# Patient Record
Sex: Female | Born: 1965
Health system: Southern US, Community
[De-identification: ages and names within clinical notes are randomized; demographics above are authoritative.]

## PROBLEM LIST (undated history)

## (undated) DIAGNOSIS — K219 Gastro-esophageal reflux disease without esophagitis: Secondary | ICD-10-CM

## (undated) DIAGNOSIS — J111 Influenza due to unidentified influenza virus with other respiratory manifestations: Secondary | ICD-10-CM

## (undated) DIAGNOSIS — M1990 Unspecified osteoarthritis, unspecified site: Secondary | ICD-10-CM

## (undated) DIAGNOSIS — R51 Headache: Secondary | ICD-10-CM

## (undated) DIAGNOSIS — R112 Nausea with vomiting, unspecified: Secondary | ICD-10-CM

## (undated) DIAGNOSIS — I4891 Unspecified atrial fibrillation: Secondary | ICD-10-CM

## (undated) DIAGNOSIS — T7840XA Allergy, unspecified, initial encounter: Secondary | ICD-10-CM

## (undated) DIAGNOSIS — Z9889 Other specified postprocedural states: Secondary | ICD-10-CM

## (undated) DIAGNOSIS — N189 Chronic kidney disease, unspecified: Secondary | ICD-10-CM

## (undated) DIAGNOSIS — I10 Essential (primary) hypertension: Secondary | ICD-10-CM

## (undated) HISTORY — DX: Chronic kidney disease, unspecified: N18.9

## (undated) HISTORY — PX: BREAST LUMPECTOMY: SHX2

## (undated) HISTORY — PX: BREAST EXCISIONAL BIOPSY: SUR124

## (undated) HISTORY — DX: Nausea with vomiting, unspecified: R11.2

## (undated) HISTORY — DX: Other specified postprocedural states: Z98.890

## (undated) HISTORY — DX: Gastro-esophageal reflux disease without esophagitis: K21.9

## (undated) HISTORY — DX: Essential (primary) hypertension: I10

## (undated) HISTORY — DX: Influenza due to unidentified influenza virus with other respiratory manifestations: J11.1

## (undated) HISTORY — PX: JOINT REPLACEMENT: SHX530

## (undated) HISTORY — PX: OTHER SURGICAL HISTORY: SHX169

## (undated) HISTORY — DX: Allergy, unspecified, initial encounter: T78.40XA

## (undated) HISTORY — DX: Unspecified osteoarthritis, unspecified site: M19.90

## (undated) HISTORY — DX: Headache: R51

## (undated) SURGERY — Surgical Case
Anesthesia: *Unknown

---

## 1986-05-02 HISTORY — PX: TUBAL LIGATION: SHX77

## 1997-09-01 ENCOUNTER — Emergency Department (HOSPITAL_COMMUNITY): Admission: EM | Admit: 1997-09-01 | Discharge: 1997-09-01 | Payer: Self-pay | Admitting: *Deleted

## 1997-09-02 ENCOUNTER — Encounter: Admission: RE | Admit: 1997-09-02 | Discharge: 1997-09-02 | Payer: Self-pay | Admitting: Family Medicine

## 1997-10-07 ENCOUNTER — Encounter: Admission: RE | Admit: 1997-10-07 | Discharge: 1997-10-07 | Payer: Self-pay | Admitting: Family Medicine

## 1997-11-04 ENCOUNTER — Encounter: Admission: RE | Admit: 1997-11-04 | Discharge: 1997-11-04 | Payer: Self-pay | Admitting: Family Medicine

## 1998-01-22 ENCOUNTER — Encounter: Admission: RE | Admit: 1998-01-22 | Discharge: 1998-01-22 | Payer: Self-pay | Admitting: Family Medicine

## 1999-04-05 ENCOUNTER — Encounter: Payer: Self-pay | Admitting: Emergency Medicine

## 1999-04-05 ENCOUNTER — Emergency Department (HOSPITAL_COMMUNITY): Admission: EM | Admit: 1999-04-05 | Discharge: 1999-04-05 | Payer: Self-pay | Admitting: Emergency Medicine

## 2000-01-16 ENCOUNTER — Emergency Department (HOSPITAL_COMMUNITY): Admission: EM | Admit: 2000-01-16 | Discharge: 2000-01-17 | Payer: Self-pay | Admitting: Emergency Medicine

## 2001-11-14 ENCOUNTER — Emergency Department (HOSPITAL_COMMUNITY): Admission: EM | Admit: 2001-11-14 | Discharge: 2001-11-15 | Payer: Self-pay | Admitting: Emergency Medicine

## 2001-12-09 ENCOUNTER — Emergency Department (HOSPITAL_COMMUNITY): Admission: EM | Admit: 2001-12-09 | Discharge: 2001-12-09 | Payer: Self-pay

## 2001-12-20 ENCOUNTER — Encounter: Admission: RE | Admit: 2001-12-20 | Discharge: 2001-12-20 | Payer: Self-pay | Admitting: Family Medicine

## 2002-01-08 ENCOUNTER — Encounter: Admission: RE | Admit: 2002-01-08 | Discharge: 2002-01-08 | Payer: Self-pay | Admitting: Family Medicine

## 2002-01-15 ENCOUNTER — Ambulatory Visit (HOSPITAL_COMMUNITY): Admission: RE | Admit: 2002-01-15 | Discharge: 2002-01-15 | Payer: Self-pay | Admitting: Family Medicine

## 2002-01-15 ENCOUNTER — Encounter: Admission: RE | Admit: 2002-01-15 | Discharge: 2002-01-15 | Payer: Self-pay | Admitting: Family Medicine

## 2002-03-12 ENCOUNTER — Encounter: Payer: Self-pay | Admitting: Sports Medicine

## 2002-03-12 ENCOUNTER — Encounter: Admission: RE | Admit: 2002-03-12 | Discharge: 2002-03-12 | Payer: Self-pay | Admitting: Sports Medicine

## 2002-03-12 ENCOUNTER — Encounter: Admission: RE | Admit: 2002-03-12 | Discharge: 2002-03-12 | Payer: Self-pay | Admitting: Family Medicine

## 2002-04-12 ENCOUNTER — Encounter: Admission: RE | Admit: 2002-04-12 | Discharge: 2002-04-12 | Payer: Self-pay | Admitting: Family Medicine

## 2002-04-18 ENCOUNTER — Encounter: Admission: RE | Admit: 2002-04-18 | Discharge: 2002-04-18 | Payer: Self-pay | Admitting: Family Medicine

## 2002-06-19 ENCOUNTER — Encounter: Admission: RE | Admit: 2002-06-19 | Discharge: 2002-06-19 | Payer: Self-pay | Admitting: Family Medicine

## 2002-10-02 ENCOUNTER — Emergency Department (HOSPITAL_COMMUNITY): Admission: EM | Admit: 2002-10-02 | Discharge: 2002-10-02 | Payer: Self-pay | Admitting: Emergency Medicine

## 2002-10-08 ENCOUNTER — Encounter: Admission: RE | Admit: 2002-10-08 | Discharge: 2002-10-08 | Payer: Self-pay | Admitting: Family Medicine

## 2002-10-29 ENCOUNTER — Encounter: Admission: RE | Admit: 2002-10-29 | Discharge: 2002-10-29 | Payer: Self-pay | Admitting: Family Medicine

## 2002-12-31 ENCOUNTER — Encounter: Admission: RE | Admit: 2002-12-31 | Discharge: 2002-12-31 | Payer: Self-pay | Admitting: Family Medicine

## 2003-01-15 ENCOUNTER — Encounter: Payer: Self-pay | Admitting: Emergency Medicine

## 2003-01-15 ENCOUNTER — Emergency Department (HOSPITAL_COMMUNITY): Admission: EM | Admit: 2003-01-15 | Discharge: 2003-01-15 | Payer: Self-pay | Admitting: Emergency Medicine

## 2003-01-16 ENCOUNTER — Encounter: Admission: RE | Admit: 2003-01-16 | Discharge: 2003-01-16 | Payer: Self-pay | Admitting: Family Medicine

## 2003-01-16 ENCOUNTER — Ambulatory Visit (HOSPITAL_COMMUNITY): Admission: RE | Admit: 2003-01-16 | Discharge: 2003-01-16 | Payer: Self-pay | Admitting: Family Medicine

## 2003-01-20 ENCOUNTER — Encounter: Admission: RE | Admit: 2003-01-20 | Discharge: 2003-01-20 | Payer: Self-pay | Admitting: Family Medicine

## 2003-01-23 ENCOUNTER — Encounter: Admission: RE | Admit: 2003-01-23 | Discharge: 2003-01-23 | Payer: Self-pay | Admitting: Family Medicine

## 2003-03-31 ENCOUNTER — Encounter: Admission: RE | Admit: 2003-03-31 | Discharge: 2003-03-31 | Payer: Self-pay | Admitting: Occupational Medicine

## 2003-07-04 ENCOUNTER — Ambulatory Visit (HOSPITAL_COMMUNITY): Admission: RE | Admit: 2003-07-04 | Discharge: 2003-07-04 | Payer: Self-pay | Admitting: Family Medicine

## 2003-07-04 ENCOUNTER — Encounter: Admission: RE | Admit: 2003-07-04 | Discharge: 2003-07-04 | Payer: Self-pay | Admitting: Family Medicine

## 2003-12-02 ENCOUNTER — Encounter: Admission: RE | Admit: 2003-12-02 | Discharge: 2003-12-02 | Payer: Self-pay | Admitting: Family Medicine

## 2004-01-14 ENCOUNTER — Emergency Department (HOSPITAL_COMMUNITY): Admission: EM | Admit: 2004-01-14 | Discharge: 2004-01-14 | Payer: Self-pay | Admitting: Emergency Medicine

## 2004-08-04 ENCOUNTER — Ambulatory Visit: Payer: Self-pay | Admitting: Family Medicine

## 2004-08-19 ENCOUNTER — Ambulatory Visit: Payer: Self-pay | Admitting: Family Medicine

## 2004-11-19 ENCOUNTER — Emergency Department (HOSPITAL_COMMUNITY): Admission: EM | Admit: 2004-11-19 | Discharge: 2004-11-19 | Payer: Self-pay | Admitting: Emergency Medicine

## 2004-11-29 ENCOUNTER — Ambulatory Visit: Payer: Self-pay | Admitting: Family Medicine

## 2004-12-23 ENCOUNTER — Ambulatory Visit: Payer: Self-pay | Admitting: Family Medicine

## 2004-12-23 ENCOUNTER — Encounter: Payer: Self-pay | Admitting: Family Medicine

## 2005-01-05 ENCOUNTER — Encounter (INDEPENDENT_AMBULATORY_CARE_PROVIDER_SITE_OTHER): Payer: Self-pay | Admitting: *Deleted

## 2005-06-09 ENCOUNTER — Ambulatory Visit: Payer: Self-pay | Admitting: Family Medicine

## 2005-06-20 ENCOUNTER — Ambulatory Visit (HOSPITAL_COMMUNITY): Admission: RE | Admit: 2005-06-20 | Discharge: 2005-06-20 | Payer: Self-pay | Admitting: Orthopedic Surgery

## 2005-07-19 ENCOUNTER — Ambulatory Visit (HOSPITAL_BASED_OUTPATIENT_CLINIC_OR_DEPARTMENT_OTHER): Admission: RE | Admit: 2005-07-19 | Discharge: 2005-07-19 | Payer: Self-pay | Admitting: Orthopedic Surgery

## 2005-07-19 ENCOUNTER — Encounter (INDEPENDENT_AMBULATORY_CARE_PROVIDER_SITE_OTHER): Payer: Self-pay | Admitting: *Deleted

## 2005-08-04 ENCOUNTER — Emergency Department (HOSPITAL_COMMUNITY): Admission: EM | Admit: 2005-08-04 | Discharge: 2005-08-04 | Payer: Self-pay | Admitting: Emergency Medicine

## 2005-12-22 ENCOUNTER — Encounter (INDEPENDENT_AMBULATORY_CARE_PROVIDER_SITE_OTHER): Payer: Self-pay | Admitting: *Deleted

## 2005-12-22 ENCOUNTER — Ambulatory Visit: Payer: Self-pay | Admitting: Family Medicine

## 2006-02-21 ENCOUNTER — Ambulatory Visit: Payer: Self-pay | Admitting: Family Medicine

## 2006-02-23 ENCOUNTER — Encounter: Admission: RE | Admit: 2006-02-23 | Discharge: 2006-02-23 | Payer: Self-pay | Admitting: Family Medicine

## 2006-06-29 DIAGNOSIS — I10 Essential (primary) hypertension: Secondary | ICD-10-CM | POA: Insufficient documentation

## 2006-06-29 DIAGNOSIS — K219 Gastro-esophageal reflux disease without esophagitis: Secondary | ICD-10-CM | POA: Insufficient documentation

## 2006-06-29 DIAGNOSIS — K589 Irritable bowel syndrome without diarrhea: Secondary | ICD-10-CM | POA: Insufficient documentation

## 2006-06-30 ENCOUNTER — Encounter (INDEPENDENT_AMBULATORY_CARE_PROVIDER_SITE_OTHER): Payer: Self-pay | Admitting: *Deleted

## 2007-01-04 ENCOUNTER — Encounter (INDEPENDENT_AMBULATORY_CARE_PROVIDER_SITE_OTHER): Payer: Self-pay | Admitting: *Deleted

## 2007-02-22 ENCOUNTER — Encounter (INDEPENDENT_AMBULATORY_CARE_PROVIDER_SITE_OTHER): Payer: Self-pay | Admitting: *Deleted

## 2007-03-19 ENCOUNTER — Encounter: Payer: Self-pay | Admitting: Family Medicine

## 2007-03-19 ENCOUNTER — Ambulatory Visit: Payer: Self-pay | Admitting: Family Medicine

## 2007-03-19 DIAGNOSIS — N281 Cyst of kidney, acquired: Secondary | ICD-10-CM | POA: Insufficient documentation

## 2007-03-19 LAB — CONVERTED CEMR LAB
CO2: 26 meq/L (ref 19–32)
Calcium: 8.8 mg/dL (ref 8.4–10.5)
Creatinine, Ser: 0.9 mg/dL (ref 0.40–1.20)
Glucose, Bld: 90 mg/dL (ref 70–99)
HCT: 36.5 % (ref 36.0–46.0)
MCHC: 34 g/dL (ref 30.0–36.0)
MCV: 77.3 fL — ABNORMAL LOW (ref 78.0–100.0)
Platelets: 313 10*3/uL (ref 150–400)
RBC: 4.72 M/uL (ref 3.87–5.11)
Sodium: 138 meq/L (ref 135–145)
WBC: 8 10*3/uL (ref 4.0–10.5)

## 2007-03-21 ENCOUNTER — Encounter: Admission: RE | Admit: 2007-03-21 | Discharge: 2007-03-21 | Payer: Self-pay | Admitting: Family Medicine

## 2007-03-23 ENCOUNTER — Telehealth (INDEPENDENT_AMBULATORY_CARE_PROVIDER_SITE_OTHER): Payer: Self-pay | Admitting: *Deleted

## 2007-03-23 ENCOUNTER — Telehealth: Payer: Self-pay | Admitting: Family Medicine

## 2007-04-27 ENCOUNTER — Telehealth: Payer: Self-pay | Admitting: Family Medicine

## 2007-04-30 ENCOUNTER — Ambulatory Visit: Payer: Self-pay | Admitting: Family Medicine

## 2007-04-30 ENCOUNTER — Encounter: Payer: Self-pay | Admitting: Family Medicine

## 2007-05-01 ENCOUNTER — Encounter: Payer: Self-pay | Admitting: Family Medicine

## 2007-05-01 ENCOUNTER — Telehealth: Payer: Self-pay | Admitting: *Deleted

## 2007-05-07 LAB — CONVERTED CEMR LAB
Pap Smear: NORMAL
Pap Smear: NORMAL

## 2007-05-08 ENCOUNTER — Encounter: Payer: Self-pay | Admitting: Family Medicine

## 2007-05-15 ENCOUNTER — Telehealth: Payer: Self-pay | Admitting: *Deleted

## 2007-06-11 ENCOUNTER — Ambulatory Visit: Payer: Self-pay | Admitting: Family Medicine

## 2007-07-09 ENCOUNTER — Telehealth: Payer: Self-pay | Admitting: Family Medicine

## 2007-07-26 ENCOUNTER — Telehealth: Payer: Self-pay | Admitting: *Deleted

## 2007-08-06 ENCOUNTER — Telehealth: Payer: Self-pay | Admitting: Family Medicine

## 2007-08-09 ENCOUNTER — Ambulatory Visit: Payer: Self-pay | Admitting: Family Medicine

## 2007-08-09 LAB — CONVERTED CEMR LAB: LDL Cholesterol: 52 mg/dL (ref 0–99)

## 2007-08-10 ENCOUNTER — Encounter: Payer: Self-pay | Admitting: Family Medicine

## 2007-09-18 ENCOUNTER — Telehealth: Payer: Self-pay | Admitting: *Deleted

## 2007-09-20 ENCOUNTER — Emergency Department (HOSPITAL_COMMUNITY): Admission: EM | Admit: 2007-09-20 | Discharge: 2007-09-21 | Payer: Self-pay | Admitting: Emergency Medicine

## 2007-09-20 ENCOUNTER — Inpatient Hospital Stay (HOSPITAL_COMMUNITY): Admission: AD | Admit: 2007-09-20 | Discharge: 2007-09-20 | Payer: Self-pay | Admitting: Obstetrics & Gynecology

## 2007-10-01 ENCOUNTER — Ambulatory Visit: Payer: Self-pay | Admitting: Family Medicine

## 2007-12-05 ENCOUNTER — Encounter: Payer: Self-pay | Admitting: Family Medicine

## 2007-12-13 ENCOUNTER — Encounter (INDEPENDENT_AMBULATORY_CARE_PROVIDER_SITE_OTHER): Payer: Self-pay | Admitting: *Deleted

## 2008-03-20 ENCOUNTER — Ambulatory Visit: Payer: Self-pay | Admitting: Family Medicine

## 2008-03-24 ENCOUNTER — Encounter: Payer: Self-pay | Admitting: Family Medicine

## 2008-03-24 ENCOUNTER — Encounter: Admission: RE | Admit: 2008-03-24 | Discharge: 2008-03-24 | Payer: Self-pay | Admitting: Family Medicine

## 2008-04-02 ENCOUNTER — Encounter: Admission: RE | Admit: 2008-04-02 | Discharge: 2008-04-02 | Payer: Self-pay | Admitting: Family Medicine

## 2008-04-04 ENCOUNTER — Encounter: Payer: Self-pay | Admitting: Family Medicine

## 2008-04-07 ENCOUNTER — Encounter: Payer: Self-pay | Admitting: Family Medicine

## 2008-04-07 ENCOUNTER — Ambulatory Visit: Payer: Self-pay | Admitting: Family Medicine

## 2008-04-07 LAB — CONVERTED CEMR LAB
CO2: 23 meq/L (ref 19–32)
Chloride: 103 meq/L (ref 96–112)
Glucose, Bld: 139 mg/dL — ABNORMAL HIGH (ref 70–99)
Sodium: 139 meq/L (ref 135–145)

## 2008-04-08 ENCOUNTER — Encounter: Payer: Self-pay | Admitting: Family Medicine

## 2008-04-18 ENCOUNTER — Ambulatory Visit (HOSPITAL_BASED_OUTPATIENT_CLINIC_OR_DEPARTMENT_OTHER): Admission: RE | Admit: 2008-04-18 | Discharge: 2008-04-18 | Payer: Self-pay | Admitting: Orthopedic Surgery

## 2008-04-28 ENCOUNTER — Encounter: Payer: Self-pay | Admitting: Family Medicine

## 2008-05-02 HISTORY — PX: BREAST SURGERY: SHX581

## 2008-06-16 ENCOUNTER — Encounter: Payer: Self-pay | Admitting: Family Medicine

## 2008-06-23 ENCOUNTER — Telehealth: Payer: Self-pay | Admitting: Family Medicine

## 2008-07-14 ENCOUNTER — Ambulatory Visit: Payer: Self-pay | Admitting: Family Medicine

## 2008-07-28 ENCOUNTER — Encounter: Admission: RE | Admit: 2008-07-28 | Discharge: 2008-07-28 | Payer: Self-pay | Admitting: Family Medicine

## 2008-08-01 ENCOUNTER — Encounter: Admission: RE | Admit: 2008-08-01 | Discharge: 2008-08-01 | Payer: Self-pay | Admitting: Family Medicine

## 2008-08-01 ENCOUNTER — Encounter: Payer: Self-pay | Admitting: Family Medicine

## 2008-08-14 ENCOUNTER — Encounter: Admission: RE | Admit: 2008-08-14 | Discharge: 2008-08-14 | Payer: Self-pay | Admitting: Family Medicine

## 2008-08-14 ENCOUNTER — Encounter: Payer: Self-pay | Admitting: Family Medicine

## 2008-08-18 ENCOUNTER — Telehealth: Payer: Self-pay | Admitting: Family Medicine

## 2008-09-10 ENCOUNTER — Ambulatory Visit (HOSPITAL_COMMUNITY): Admission: RE | Admit: 2008-09-10 | Discharge: 2008-09-11 | Payer: Self-pay | Admitting: General Surgery

## 2008-09-10 ENCOUNTER — Encounter (INDEPENDENT_AMBULATORY_CARE_PROVIDER_SITE_OTHER): Payer: Self-pay | Admitting: General Surgery

## 2008-09-10 ENCOUNTER — Encounter: Admission: RE | Admit: 2008-09-10 | Discharge: 2008-09-10 | Payer: Self-pay | Admitting: General Surgery

## 2008-09-12 ENCOUNTER — Telehealth: Payer: Self-pay | Admitting: Family Medicine

## 2008-09-19 ENCOUNTER — Ambulatory Visit: Payer: Self-pay | Admitting: Oncology

## 2008-10-14 LAB — CBC WITH DIFFERENTIAL (CANCER CENTER ONLY)
BASO#: 0 10*3/uL (ref 0.0–0.2)
Eosinophils Absolute: 0.2 10*3/uL (ref 0.0–0.5)
HGB: 12.5 g/dL (ref 11.6–15.9)
LYMPH%: 41.3 % (ref 14.0–48.0)
MCH: 26.8 pg (ref 26.0–34.0)
MCV: 77 fL — ABNORMAL LOW (ref 81–101)
MONO#: 0.4 10*3/uL (ref 0.1–0.9)
MONO%: 7.8 % (ref 0.0–13.0)
NEUT#: 2.3 10*3/uL (ref 1.5–6.5)
RBC: 4.66 10*6/uL (ref 3.70–5.32)
WBC: 4.9 10*3/uL (ref 3.9–10.0)

## 2008-10-14 LAB — CMP (CANCER CENTER ONLY)
ALT(SGPT): 27 U/L (ref 10–47)
AST: 26 U/L (ref 11–38)
Albumin: 3.2 g/dL — ABNORMAL LOW (ref 3.3–5.5)
BUN, Bld: 10 mg/dL (ref 7–22)
Calcium: 10 mg/dL (ref 8.0–10.3)
Chloride: 106 mEq/L (ref 98–108)
Potassium: 3.2 mEq/L — ABNORMAL LOW (ref 3.3–4.7)

## 2008-10-20 ENCOUNTER — Ambulatory Visit: Payer: Self-pay | Admitting: Family Medicine

## 2008-10-21 ENCOUNTER — Encounter: Admission: RE | Admit: 2008-10-21 | Discharge: 2008-10-21 | Payer: Self-pay | Admitting: Family Medicine

## 2008-10-22 ENCOUNTER — Encounter: Payer: Self-pay | Admitting: Family Medicine

## 2008-11-10 ENCOUNTER — Encounter: Payer: Self-pay | Admitting: Family Medicine

## 2008-11-10 ENCOUNTER — Ambulatory Visit: Payer: Self-pay | Admitting: Family Medicine

## 2008-12-01 ENCOUNTER — Encounter: Payer: Self-pay | Admitting: Family Medicine

## 2008-12-04 ENCOUNTER — Telehealth: Payer: Self-pay | Admitting: Family Medicine

## 2008-12-12 ENCOUNTER — Ambulatory Visit: Payer: Self-pay | Admitting: Oncology

## 2008-12-17 LAB — CMP (CANCER CENTER ONLY)
Albumin: 3.3 g/dL (ref 3.3–5.5)
Alkaline Phosphatase: 57 U/L (ref 26–84)
BUN, Bld: 8 mg/dL (ref 7–22)
Glucose, Bld: 152 mg/dL — ABNORMAL HIGH (ref 73–118)
Potassium: 3.3 mEq/L (ref 3.3–4.7)
Total Bilirubin: 0.6 mg/dl (ref 0.20–1.60)

## 2008-12-17 LAB — CBC WITH DIFFERENTIAL (CANCER CENTER ONLY)
BASO#: 0 10*3/uL (ref 0.0–0.2)
Eosinophils Absolute: 0.1 10*3/uL (ref 0.0–0.5)
HGB: 12.7 g/dL (ref 11.6–15.9)
LYMPH#: 2.7 10*3/uL (ref 0.9–3.3)
NEUT#: 2.9 10*3/uL (ref 1.5–6.5)
RBC: 4.78 10*6/uL (ref 3.70–5.32)
WBC: 6.2 10*3/uL (ref 3.9–10.0)

## 2009-01-15 ENCOUNTER — Encounter: Admission: RE | Admit: 2009-01-15 | Discharge: 2009-01-15 | Payer: Self-pay | Admitting: General Surgery

## 2009-03-17 ENCOUNTER — Ambulatory Visit: Payer: Self-pay | Admitting: Oncology

## 2009-03-18 LAB — CBC WITH DIFFERENTIAL (CANCER CENTER ONLY)
BASO%: 0.5 % (ref 0.0–2.0)
EOS%: 2.1 % (ref 0.0–7.0)
HGB: 12.1 g/dL (ref 11.6–15.9)
LYMPH#: 2.6 10*3/uL (ref 0.9–3.3)
MCHC: 33.3 g/dL (ref 32.0–36.0)
NEUT#: 2.4 10*3/uL (ref 1.5–6.5)
Platelets: 319 10*3/uL (ref 145–400)
RDW: 13.5 % (ref 10.5–14.6)

## 2009-03-18 LAB — CMP (CANCER CENTER ONLY)
ALT(SGPT): 22 U/L (ref 10–47)
AST: 28 U/L (ref 11–38)
Albumin: 3.3 g/dL (ref 3.3–5.5)
Alkaline Phosphatase: 65 U/L (ref 26–84)
Potassium: 3 mEq/L — ABNORMAL LOW (ref 3.3–4.7)
Sodium: 136 mEq/L (ref 128–145)
Total Protein: 7.8 g/dL (ref 6.4–8.1)

## 2009-03-20 ENCOUNTER — Encounter: Admission: RE | Admit: 2009-03-20 | Discharge: 2009-03-20 | Payer: Self-pay | Admitting: General Surgery

## 2009-05-02 HISTORY — PX: FRACTURE SURGERY: SHX138

## 2009-05-11 ENCOUNTER — Ambulatory Visit: Payer: Self-pay | Admitting: Family Medicine

## 2009-05-11 DIAGNOSIS — R7309 Other abnormal glucose: Secondary | ICD-10-CM | POA: Insufficient documentation

## 2009-05-12 ENCOUNTER — Encounter: Payer: Self-pay | Admitting: Family Medicine

## 2009-05-12 LAB — CONVERTED CEMR LAB
BUN: 6 mg/dL (ref 6–23)
CO2: 28 meq/L (ref 19–32)
Chloride: 97 meq/L (ref 96–112)
Creatinine, Ser: 0.88 mg/dL (ref 0.40–1.20)
Glucose, Bld: 108 mg/dL — ABNORMAL HIGH (ref 70–99)
Potassium: 3.2 meq/L — ABNORMAL LOW (ref 3.5–5.3)

## 2009-06-26 ENCOUNTER — Ambulatory Visit: Payer: Self-pay | Admitting: Oncology

## 2009-07-10 ENCOUNTER — Ambulatory Visit: Payer: Self-pay | Admitting: Family Medicine

## 2009-07-10 ENCOUNTER — Encounter: Payer: Self-pay | Admitting: Family Medicine

## 2009-07-13 LAB — CONVERTED CEMR LAB
BUN: 10 mg/dL (ref 6–23)
Calcium: 9.6 mg/dL (ref 8.4–10.5)
Creatinine, Ser: 0.81 mg/dL (ref 0.40–1.20)
Glucose, Bld: 110 mg/dL — ABNORMAL HIGH (ref 70–99)
Potassium: 3.3 meq/L — ABNORMAL LOW (ref 3.5–5.3)

## 2009-09-18 ENCOUNTER — Encounter: Admission: RE | Admit: 2009-09-18 | Discharge: 2009-09-18 | Payer: Self-pay | Admitting: General Surgery

## 2009-11-11 ENCOUNTER — Ambulatory Visit: Payer: Self-pay | Admitting: Family Medicine

## 2009-11-11 DIAGNOSIS — S82899A Other fracture of unspecified lower leg, initial encounter for closed fracture: Secondary | ICD-10-CM | POA: Insufficient documentation

## 2009-11-11 LAB — CONVERTED CEMR LAB
ALT: 14 units/L (ref 0–35)
Albumin: 4 g/dL (ref 3.5–5.2)
CO2: 24 meq/L (ref 19–32)
MCV: 77 fL — ABNORMAL LOW (ref 78.0–100.0)
Platelets: 339 10*3/uL (ref 150–400)
Potassium: 3.4 meq/L — ABNORMAL LOW (ref 3.5–5.3)
Sodium: 135 meq/L (ref 135–145)
Total Bilirubin: 0.3 mg/dL (ref 0.3–1.2)
Total Protein: 8.1 g/dL (ref 6.0–8.3)
WBC: 6.7 10*3/uL (ref 4.0–10.5)

## 2009-11-12 ENCOUNTER — Encounter: Payer: Self-pay | Admitting: Family Medicine

## 2009-11-17 ENCOUNTER — Encounter: Payer: Self-pay | Admitting: Family Medicine

## 2009-11-17 ENCOUNTER — Ambulatory Visit (HOSPITAL_COMMUNITY): Admission: RE | Admit: 2009-11-17 | Discharge: 2009-11-17 | Payer: Self-pay | Admitting: Family Medicine

## 2009-12-03 ENCOUNTER — Encounter: Payer: Self-pay | Admitting: Family Medicine

## 2009-12-15 ENCOUNTER — Ambulatory Visit (HOSPITAL_COMMUNITY): Admission: RE | Admit: 2009-12-15 | Discharge: 2009-12-15 | Payer: Self-pay | Admitting: Urology

## 2010-01-01 ENCOUNTER — Ambulatory Visit (HOSPITAL_COMMUNITY): Admission: RE | Admit: 2010-01-01 | Discharge: 2010-01-01 | Payer: Self-pay | Admitting: Urology

## 2010-01-20 ENCOUNTER — Encounter: Payer: Self-pay | Admitting: Family Medicine

## 2010-03-04 ENCOUNTER — Ambulatory Visit: Payer: Self-pay | Admitting: Family Medicine

## 2010-03-04 DIAGNOSIS — N926 Irregular menstruation, unspecified: Secondary | ICD-10-CM | POA: Insufficient documentation

## 2010-03-16 ENCOUNTER — Telehealth: Payer: Self-pay | Admitting: *Deleted

## 2010-03-29 ENCOUNTER — Encounter: Admission: RE | Admit: 2010-03-29 | Discharge: 2010-03-29 | Payer: Self-pay | Admitting: Family Medicine

## 2010-03-31 ENCOUNTER — Telehealth: Payer: Self-pay | Admitting: Family Medicine

## 2010-05-09 ENCOUNTER — Encounter: Payer: Self-pay | Admitting: Family Medicine

## 2010-05-17 ENCOUNTER — Ambulatory Visit: Admission: RE | Admit: 2010-05-17 | Discharge: 2010-05-17 | Payer: Self-pay | Source: Home / Self Care

## 2010-05-17 ENCOUNTER — Encounter: Payer: Self-pay | Admitting: Family Medicine

## 2010-05-17 LAB — CONVERTED CEMR LAB
HCT: 35.8 % — ABNORMAL LOW (ref 36.0–46.0)
MCV: 75.4 fL — ABNORMAL LOW (ref 78.0–100.0)
Platelets: 380 10*3/uL (ref 150–400)
RDW: 15.2 % (ref 11.5–15.5)
TSH: 1.686 microintl units/mL (ref 0.350–4.500)

## 2010-05-20 ENCOUNTER — Ambulatory Visit (HOSPITAL_COMMUNITY)
Admission: RE | Admit: 2010-05-20 | Discharge: 2010-05-20 | Payer: Self-pay | Source: Home / Self Care | Attending: Urology | Admitting: Urology

## 2010-05-20 ENCOUNTER — Ambulatory Visit (HOSPITAL_COMMUNITY)
Admission: RE | Admit: 2010-05-20 | Discharge: 2010-05-20 | Payer: Self-pay | Source: Home / Self Care | Attending: Family Medicine | Admitting: Family Medicine

## 2010-05-24 ENCOUNTER — Encounter: Payer: Self-pay | Admitting: Family Medicine

## 2010-05-24 LAB — CREATININE, SERUM
Creatinine, Ser: 0.86 mg/dL (ref 0.4–1.2)
GFR calc Af Amer: >60 mL/min (ref >60)

## 2010-05-31 ENCOUNTER — Telehealth (INDEPENDENT_AMBULATORY_CARE_PROVIDER_SITE_OTHER): Payer: Self-pay | Admitting: *Deleted

## 2010-06-01 NOTE — Assessment & Plan Note (Signed)
Summary: f/up,tcb   Vital Signs:  Patient profile:   45 year old female Temp:     98.6 degrees F Pulse rate:   68 / minute BP sitting:   150 / 86  Vitals Entered By: Jone Baseman CMA (May 11, 2009 3:44 PM) CC: f/u BP Is Patient Diabetic? No Pain Assessment Patient in pain? yes     Location: left foot Intensity: 7   CC:  f/u BP.  History of Present Illness: Hyperglycemia had high reading at a health fair and once at her oncologist.  Strong family history of diabetes.  No polyuria but does have dry mouth and drinks frequently  HYPERTENSION Disease Monitoring   Blood pressure range: has cuff but does not check usually.  Has been controlled at her oncologists      Chest pain: N     Dyspnea:N Medications   Compliance: daily   Lightheadedness: N     Edema:N Prevention   Exercise: occsl    Salt restriction:Y  ROS - as above PMH - Medications reviewed and updated in medication list.  Smoking Status noted in VS form       Current Medications (verified): 1)  Enalapril Maleate 10 Mg  Tabs (Enalapril Maleate) .... Take 1 Tab By Mouth Two Times A Day For Your Blood Pressure 2)  Hydrochlorothiazide 25 Mg Tabs (Hydrochlorothiazide) .... Take 1 Tablet By Mouth Every Morning  Allergies: 1)  Septra (Sulfamethoxazole-Trimethoprim) 2)  Tetracycline Hcl (Tetracycline Hcl)  Physical Exam  General:  no acute distress healthy appearing    Impression & Recommendations:  Problem # 1:  HYPERGLYCEMIA (ICD-790.29) check labs.  Discussed importance of diet and wt loss .  Exercise will be difficult with her ankle fracture 2 days ago with surgery planned.  Also had surgery for an oral cyst and had an abnormal mammogram that will be rechecked in mid 2011 Orders: A1C-[facility] ([address]109) [facility]- Est  Level 4 (60454)  Problem # 2:  HYPERTENSION, BENIGN SYSTEMIC (ICD-401.1) Not well controlled but may be due to recent injury.  Asked her to follow it at home and to take enalapril two times a  day instead of both tablets at once  Her updated medication list for this problem includes:    Enalapril Maleate 10 Mg Tabs (Enalapril maleate) .Marland Kitchen... Take 1 tab by mouth two times a day for your blood pressure    Hydrochlorothiazide 25 Mg Tabs (Hydrochlorothiazide) .Marland Kitchen... Take 1 tablet by mouth every morning  Orders: T-Basic Metabolic Panel [phone]) [facility]- Est  Level 4 (99214)  BP today: 150/86 Prior BP: 152/92 (11/10/2008)  Labs Reviewed: K+: 3.6 (04/07/2008) Creat: : 0.89 (04/07/2008)   Chol: 115 (08/09/2007)   HDL: 45 (08/09/2007)   LDL: 52 (08/09/2007)   TG: 89 (08/09/2007)  Problem # 3:  KIDNEY CYST, ACQUIRED (ICD-593.2) recheck imaging in July 2011  Problem # 4:  OTHER SPECIFIED DISORDERS OF BREAST - INTRADUCTAL HYPERPLASI (ICD-611.89) being followed by Dr Welton Flakes.   Not on tamoxifen  Complete Medication List: 1)  Enalapril Maleate 10 Mg Tabs (Enalapril maleate) .... Take 1 tab by mouth two times a day for your blood pressure 2)  Hydrochlorothiazide 25 Mg Tabs (Hydrochlorothiazide) .... Take 1 tablet by mouth every morning  Patient Instructions: 1)  Please schedule a follow-up appointment in 6 months .  2)  Call if your blood pressure is > 140/90 regularly 3)  You need lose weight for your health and tendency to have diabetes - goal would be 2 lbs a  week  4)  I will call you if your lab is abnormal otherwise I will send you a letter within 2 weeks. Prescriptions: ENALAPRIL MALEATE 10 MG  TABS (ENALAPRIL MALEATE) Take 1 tab by mouth two times a day for your blood pressure  #180 x 3   Entered and Authorized by:   Pearlean Brownie MD   Signed by:   Pearlean Brownie MD on 05/11/2009   Method used:   [facility] to        [facility] [phone]* (retail)       [address] [place], [place]  [address]       Ph: [phone]       Fax: [phone]   RxID:   [id number]    Prevention & Chronic Care Immunizations   Influenza vaccine: given   (03/13/2008)   Influenza vaccine due: 03/13/2009    Tetanus booster: Not documented    Pneumococcal vaccine: Not documented  Other Screening   Pap smear: NEGATIVE FOR INTRAEPITHELIAL LESIONS OR MALIGNANCY.  (11/10/2008)   Pap smear due: 05/06/2009    Mammogram: 76095.0^MM BREAST STEREO BIOPSY*L*  (08/14/2008)   Mammogram due: 06/30/2004   Smoking status: never  (11/10/2008)  Lipids   Total Cholesterol: 115  (08/09/2007)   LDL: 52  (08/09/2007)   LDL Direct: Not documented   HDL: 45  (08/09/2007)   Triglycerides: 89  (08/09/2007)  Hypertension   Last Blood Pressure: 150 / 86  (05/11/2009)   Serum creatinine: 0.89  (04/07/2008)   BMP action: Ordered   Serum potassium 3.6  (04/07/2008)    Hypertension flowsheet reviewed?: Yes   Progress toward BP goal: Unchanged  Self-Management Support :   Personal Goals (by the next clinic visit) :      Personal blood pressure goal: 140/90  (05/11/2009)   Patient will work on the following items until the next clinic visit to reach self-care goals:     Medications and monitoring: check my blood pressure  (05/11/2009)     Other: Try salt substitute   (05/11/2009)    Hypertension self-management support: BP self-monitoring log, Written self-care plan  (05/11/2009)   Hypertension self-care plan printed.  Laboratory Results   Blood Tests   Date/Time Received: May 11, 2009 4:08 PM  Date/Time Reported: May 11, 2009 4:23 PM   HGBA1C: 6.1%   (Normal Range: Non-Diabetic - 3-6%   Control Diabetic - 6-8%)  Comments: ...........test performed by...........Marland KitchenTerese Door, CMA

## 2010-06-01 NOTE — Consult Note (Signed)
[facility]   Imported By: Clydell Hakim 12/11/2009 11:04:29  _____________________________________________________________________  External Attachment:    Type:   Image     Comment:   External Document

## 2010-06-01 NOTE — Assessment & Plan Note (Signed)
Summary: f/up,tcb   Vital Signs:  Patient profile:   45 year old female Height:      68 inches Weight:      220 pounds BMI:     33.57 BSA:     2.13 Temp:     98.5 degrees F Pulse rate:   66 / minute BP sitting:   145 / 83  Vitals Entered By: Delora Fuel (November 11, 2009 11:37 AM) CC: f/u Is Patient Diabetic? No Pain Assessment Patient in pain? yes     Location: left ankle Intensity: 8   CC:  f/u.  History of Present Illness: Current Problems:  HYPERGLYCEMIA (ICD-790.29) has not lost weight.  No polyuria or dipisia  HYPERTENSION, BENIGN SYSTEMIC (ICD-401.1) home readings in 130/70 but not checking often.  Does have dry cough she attributes to enalapril.  No lightheadedness or chest pain.  Takes her medications regularly but has not taken potassium for several months.  Has been taking ibuprofen for ankle pain but decreasing now  Ankle Fx in Jan 2011 has cont'd pain and soft tissue swelling is undergoing work up   OTHER SPECIFIED DISORDERS OF BREAST - INTRADUCTAL HYPERPLASI (ICD-611.89) getting regular mammograms q 6 months  KIDNEY CYST, ACQUIRED (ICD-593.2) no bleeding. Has back pain but thinks is likely musculoskeletal from change in gait with ankle fracture  ROS - as above PMH - Medications reviewed and updated in medication list.  Smoking Status noted in VS form     Habits & Providers  Alcohol-Tobacco-Diet     Tobacco Status: never  Current Medications (verified): 1)  Hydrochlorothiazide 25 Mg Tabs (Hydrochlorothiazide) .... Take 1 Tablet By Mouth Every Morning 2)  Losartan Potassium 50 Mg Tabs (Losartan Potassium) .Marland Kitchen.. 1 By Mouth A Day For Blood Pressure  Allergies: 1)  Septra (Sulfamethoxazole-Trimethoprim) 2)  Tetracycline Hcl (Tetracycline Hcl)  Family History: Colon CA 1st degree, hypertension, Neuroblastoma-nephew Family History Diabetes 1st degree relative  Social History: works at [employer]; Lives with husband in [place] (he  is a Investment banker, operational)  Physical Exam  General:  Well-developed,well-nourished,in no acute distress; alert,appropriate and cooperative throughout examination Lungs:  Normal respiratory effort, chest expands symmetrically. Lungs are clear to auscultation, no crackles or wheezes. Heart:  Normal rate and regular rhythm. S1 and S2 normal without gallop, murmur, click, rub or other extra sounds. Extremities:  L LE - well healed scars at ankle 1 + edema with hyperpigmenttation    Impression & Recommendations:  Problem # 1:  HYPERTENSION, BENIGN SYSTEMIC (ICD-401.1) well controlled by home readings.  Change to ARB for cough  Check labs  The following medications were removed from the medication list:    Enalapril Maleate 10 Mg Tabs (Enalapril maleate) .Marland Kitchen... Take 1 tab by mouth two times a day for your blood pressure Her updated medication list for this problem includes:    Hydrochlorothiazide 25 Mg Tabs (Hydrochlorothiazide) .Marland Kitchen... Take 1 tablet by mouth every morning    Losartan Potassium 50 Mg Tabs (Losartan potassium) .Marland Kitchen... 1 by mouth a day for blood pressure  Orders: Comp Met-[facility] (56213-08657) CBC-[facility] (84696) [facility]- Est  Level 4 (99214)  BP today: 145/83 Prior BP: 150/86 (05/11/2009)  Labs Reviewed: K+: 3.3 (07/10/2009) Creat: : 0.81 (07/10/2009)   Chol: 115 (08/09/2007)   HDL: 45 (08/09/2007)   LDL: 52 (08/09/2007)   TG: 89 (08/09/2007)  Problem # 2:  HYPERGLYCEMIA (ICD-790.29) check labs and A1c.  Discussed importance of diet and weight loss - which is harder with her cont'd problems with ankle  fracture  Orders: A1C-[facility] (04540) [facility]- Est  Level 4 (98119)  Problem # 3:  KIDNEY CYST, ACQUIRED (ICD-593.2) check follow up ultrasound  Orders: Ultrasound (Ultrasound) [facility]- Est  Level 4 (14782)  Problem # 4:  FRACTURE, ANKLE, LEFT (ICD-824.8) status post  internal fixation but with continued pain and disabilty.  Sees Dr Thedore Mins in [place]  Problem # 5:  LIBIDO, DECREASED (ICD-799.81) discussed  with her and husband.  Likely multifactorial.   Urged to continue to communicate  Complete Medication List: 1)  Hydrochlorothiazide 25 Mg Tabs (Hydrochlorothiazide) .... Take 1 tablet by mouth every morning 2)  Losartan Potassium 50 Mg Tabs (Losartan potassium) .Marland Kitchen.. 1 by mouth a day for blood pressure  Patient Instructions: 1)  Please schedule a follow-up appointment in 3-6 months .  2)  Call if your cough is not better after 2 months 3)  Check your blood pressure regullarly three times a week 4)  If > 140/90 regularly then come in for a visit 5)  I will call you if your lab is abnormal otherwise I will send you a letter within 2 weeks. 6)  The way to prevent diabetes is to lose weight - 2 lbs a week 7)  weigh yourself once a week Prescriptions: LOSARTAN POTASSIUM 50 MG TABS (LOSARTAN POTASSIUM) 1 by mouth a day for blood pressure  #30 x 3   Entered and Authorized by:   Pearlean Brownie MD   Signed by:   Pearlean Brownie MD on 11/11/2009   Method used:   [facility]ron[facility] to        [facility] # [phone]* (retail)       [address] [place], [place]  [address]       Ph: [phone]       Fax: [phone]   RxID:   [id number]    Prevention & Chronic Care Immunizations   Influenza vaccine: given  (03/13/2008)   Influenza vaccine due: 03/13/2009    Tetanus booster: Not documented    Pneumococcal vaccine: Not documented  Other Screening   Pap smear: NEGATIVE FOR INTRAEPITHELIAL LESIONS OR MALIGNANCY.  (11/10/2008)   Pap smear due: 05/06/2009    Mammogram: 76095.0^MM BREAST STEREO BIOPSY*L*  (08/14/2008)   Mammogram due: 06/30/2004   Smoking status: never  (11/11/2009)  Lipids   Total Cholesterol: 115  (08/09/2007)   LDL: 52  (08/09/2007)   LDL Direct: Not documented   HDL: 45  (08/09/2007)   Triglycerides: 89  (08/09/2007)  Hypertension   Last Blood Pressure: 145 / 83  (11/11/2009)   Serum creatinine: 0.81  (07/10/2009)   BMP action: Ordered    Serum potassium 3.3  (07/10/2009) CMP ordered   Self-Management Support :   Personal Goals (by the next clinic visit) :      Personal blood pressure goal: 140/90  (05/11/2009)   Hypertension self-management support: BP self-monitoring log, Written self-care plan  (05/11/2009)   Appended Document: A1c results  Laboratory Results   Blood Tests   Date/Time Received: November 11, 2009 12:15 PM  Date/Time Reported: November 11, 2009 1:50 PM   HGBA1C: 6.1%   (Normal Range: Non-Diabetic - 3-6%   Control Diabetic - 6-8%)  Comments: ...........test performed by...........Marland KitchenTerese Door, CMA

## 2010-06-01 NOTE — Miscellaneous (Signed)
Summary: Referral to Urology  Clinical Lists Changes thanks Pearlean Brownie MD  November 17, 2009 5:34 PM  Orders: Added new Referral order of Urology Referral (Urology) - Signed

## 2010-06-01 NOTE — Letter (Signed)
Summary: Generic Letter  [facility]  [address]   [place], [place] [address]   Phone: [phone]  Fax: [phone]    05/12/2009  [facility] [address] [place], [place]  [address]  Dear Ms. Rahilly,  Hope your foot surgery went well.     Your blood test show that your blood sugar is a little high but technically you do not yet have diabetes.  Really work on diet and weight loss.  Your other blood tests are all good except your potassium is still a little low.  Try to eat bananas and juice regularly and try adding the salt substitute to your food.  We should recheck your blood tests in 1-2 months.  You do not need an appointment but call the day before.   Sincerely,   Pearlean Brownie MD  Appended Document: Generic Letter mailed.

## 2010-06-01 NOTE — Assessment & Plan Note (Signed)
Summary: cpe/pap,df   Vital Signs:  Patient profile:   45 year old female Height:      68 inches Weight:      225 pounds BMI:     34.33 Temp:     98.4 degrees F oral Pulse rate:   60 / minute BP sitting:   140 / 83  (left arm) Cuff size:   large  Vitals Entered By: Garen Grams LPN (March 04, 2010 2:27 PM) CC: cpp Is Patient Diabetic? No Pain Assessment Patient in pain? yes     Location: left ankle Intensity: 9   CC:  cpp.  History of Present Illness: HYPERTENSION Disease Monitoring   Blood pressure range: has been good at all her recent dr visits      Chest pain: N     Dyspnea:N Medications   Compliance: daily meds. Cough has resolved   Lightheadedness: N     Edema: just in left ankle where had fracture  Hyperglycemia trying to lose weight but did go up 5 lbs.  No polyuria or dipsia.   When checks blood sugar on friends meter is < 120  Irreg MP had period on and off over 3 days the end of Oct.  Was normal in Sept and August she believes.  Did have hot flashes around time of last period.  No vaginal  bleeding since or anyother bleeding   ROS - as above PMH - Medications reviewed and updated in medication list.  Smoking Status noted in VS form      Habits & Providers  Alcohol-Tobacco-Diet     Tobacco Status: never  Current Medications (verified): 1)  Hydrochlorothiazide 25 Mg Tabs (Hydrochlorothiazide) .... Take 1 Tablet By Mouth Every Morning 2)  Losartan Potassium 50 Mg Tabs (Losartan Potassium) .Marland Kitchen.. 1 By Mouth A Day For Blood Pressure  Allergies: 1)  Septra 2)  Tetracycline Hcl (Tetracycline Hcl)  Physical Exam  General:  Well-developed,well-nourished,in no acute distress; alert,appropriate and cooperative throughout examination Neck:  No deformities, masses, or tenderness noted. Lungs:  Normal respiratory effort, chest expands symmetrically. Lungs are clear to auscultation, no crackles or wheezes. Heart:  Normal rate and regular rhythm. S1 and S2  normal without gallop, murmur, click, rub or other extra sounds.   Impression & Recommendations:  Problem # 1:  IRREGULAR MENSES (ICD-626.4) Assessment New  may be early menopause vs dysfunctional uterine bleeding.  Will follow with diary  Orders: [facility]- Est  Level 4 ([address])  Problem # 2:  HYPERGLYCEMIA (ICD-790.29) blood sugar today 78.  Seems to have resolved.  Recommend continue to work on weight loss.    Orders: Glucose Cap-[facility] (60454) [facility]- Est  Level 4 ([address]11)  Problem # 3:  HYPERTENSION, BENIGN SYSTEMIC (ICD-401.1) Well controlled continue current medications.  Encourage weight loss is part of a biggest loser contest  Orders: [facility]- Est  Level 4 (91478)  Labs Reviewed: Creat: 0.77 (11/11/2009)     Her updated medication list for this problem includes:    Hydrochlorothiazide 25 Mg Tabs (Hydrochlorothiazide) .Marland Kitchen... Take 1 tablet by mouth every morning    Losartan Potassium 50 Mg Tabs (Losartan potassium) .Marland Kitchen... 1 by mouth a day for blood pressure  BP today: 140/83 Prior BP: 145/83 (11/11/2009)  Labs Reviewed: K+: 3.4 (11/11/2009) Creat: : 0.77 (11/11/2009)   Chol: 115 (08/09/2007)   HDL: 45 (08/09/2007)   LDL: 52 (08/09/2007)   TG: 89 (08/09/2007)  Complete Medication List: 1)  Hydrochlorothiazide 25 Mg Tabs (Hydrochlorothiazide) .... Take 1 tablet  by mouth every morning 2)  Losartan Potassium 50 Mg Tabs (Losartan potassium) .Marland Kitchen.. 1 by mouth a day for blood pressure  Patient Instructions: 1)  Come back in July 2012 for Pap 2)  Check your blood pressure regularly if over 140/90 most of the time then call us 3)  Keep a calendar of your menstrual periods if are irregular for 3 months the come back 4)  Your weight was 225 today aim is to lose about 2-3 lb a week    Orders Added: 1)  Glucose Cap-[facility] [82948] 2)  [facility]- Est  Level 4 [66063]    Prevention & Chronic Care Immunizations   Influenza vaccine: given  (03/13/2008)   Influenza vaccine due: 03/13/2009    Tetanus  booster: Not documented    Pneumococcal vaccine: Not documented  Other Screening   Pap smear: NEGATIVE FOR INTRAEPITHELIAL LESIONS OR MALIGNANCY.  (11/10/2008)   Pap smear action/deferral: Deferred-2 yr interval  (03/04/2010)   Pap smear due: 11/11/2010    Mammogram: 76095.0^MM BREAST STEREO BIOPSY*L*  (08/14/2008)   Mammogram due: 06/30/2004   Smoking status: never  (03/04/2010)  Lipids   Total Cholesterol: 115  (08/09/2007)   LDL: 52  (08/09/2007)   LDL Direct: Not documented   HDL: 45  (08/09/2007)   Triglycerides: 89  (08/09/2007)  Hypertension   Last Blood Pressure: 140 / 83  (03/04/2010)   Serum creatinine: 0.77  (11/11/2009)   BMP action: Ordered   Serum potassium 3.4  (11/11/2009)    Hypertension flowsheet reviewed?: Yes   Progress toward BP goal: At goal  Self-Management Support :   Personal Goals (by the next clinic visit) :      Personal blood pressure goal: 140/90  (05/11/2009)   Hypertension self-management support: BP self-monitoring log, Written self-care plan  (05/11/2009)

## 2010-06-01 NOTE — Progress Notes (Signed)
Summary: phn msg  Phone Note Call from Patient Call back at Home Phone [phone]   Caller: Patient Summary of Call: want him to give her something to help her sleep - forgot to speak this at appt last week- will come in if necessary but if not, [facility]s [facility]  if you need to speak w/ patient, then she can be reached after 3pm Initial call taken by: De Nurse,  March 16, 2010 10:24 AM  Follow-up for Phone Call        Pls call in Rx.  Let her know to take only infrequently and if not helping needs on ov  thanks  Follow-up by: Pearlean Brownie MD,  March 16, 2010 4:54 PM  Additional Follow-up for Phone Call Additional follow up Details #1::        Rx called in, patient informed of above. Additional Follow-up by: Garen Grams LPN,  March 17, 2010 12:18 PM    New/Updated Medications: ZOLPIDEM TARTRATE 10 MG TABS (ZOLPIDEM TARTRATE) 1/2 tablet at bedtime as needed not more than 2-3 nighte per week Prescriptions: ZOLPIDEM TARTRATE 10 MG TABS (ZOLPIDEM TARTRATE) 1/2 tablet at bedtime as needed not more than 2-3 nighte per week  #20 x 0   Entered and Authorized by:   Pearlean Brownie MD   Signed by:   Pearlean Brownie MD on 03/16/2010   Method used:   Telephoned to ...       [facility] [address] [facility] [address] (retail)       [address] [place], [place]  [address]       Ph: [phone]       Fax: [phone]   RxID:   [id number]

## 2010-06-01 NOTE — Progress Notes (Signed)
  Phone Note Call from Patient   Caller: Patient Call For: [phone] Summary of Call: [name] forgot to ask about completing FMLA papers for her husband due to him being her primary caregiver.  She wanted to know if she needed to see you about this first or can she just have them dropped off and filled out.  Please call her at above number. Initial call taken by: Abundio Miu,  March 31, 2010 3:54 PM  Follow-up for Phone Call        Told I could not fill out since I am not the treating physcian for the ortho problem Follow-up by: Pearlean Brownie MD,  April 01, 2010 11:38 AM

## 2010-06-01 NOTE — Letter (Signed)
Summary: Generic Letter  [facility]  [address]   [place], [place] [address]   Phone: [phone]  Fax: [phone]    11/12/2009  [facility] [address] [place], [place]  [address]  Dear Ms. Ossa,  Your blood tests were all good except your potassium was slightly low and your diabetes test was slightly high.  I would suggest that you work very hard to lose weight, which will help with the borderline diabetes,  and that you increase your intake of bananas, orange juice, and green veggies, which will help with potassium.  See you back 3 months.  I hope the ankle will improve.   Sincerely,   Pearlean Brownie MD  Appended Document: Generic Letter mailed

## 2010-06-01 NOTE — Consult Note (Signed)
[facility]: [facility]   Imported By: Clydell Hakim 01/21/2010 14:53:38  _____________________________________________________________________  External Attachment:    Type:   Image     Comment:   External Document

## 2010-06-03 ENCOUNTER — Encounter: Payer: Self-pay | Admitting: *Deleted

## 2010-06-03 NOTE — Progress Notes (Signed)
Summary: [facility] [place]  [facility] [place] [place]   Imported By: De Nurse 05/25/2010 16:54:44  _____________________________________________________________________  External Attachment:    Type:   Image     Comment:   External Document

## 2010-06-03 NOTE — Assessment & Plan Note (Signed)
Summary: menses prob,df   Vital Signs:  Patient profile:   45 year old female Height:      68 inches Weight:      228.3 pounds BMI:     34.84 Temp:     98.7 degrees F oral Pulse rate:   63 / minute BP sitting:   138 / 64  (left arm) Cuff size:   large  Vitals Entered By: Garen Grams LPN (May 17, 2010 1:39 PM) CC: continued menstrual bleeding since christmas Is Patient Diabetic? No Pain Assessment Patient in pain? no        CC:  continued menstrual bleeding since christmas.  History of Present Illness: Irregular menstrual periods Has had irregular periods for years.  Had biopsy in 2008.  Was on BCP which helped but had to stop when had intraductal hyperplasia.   Currenlty has had period for last month with heavy clots.  No other bleeding.  Her mothed died when she was young so does not know when went thru menopause. No lightheadedness or chest pain with exertion  ROS - as above PMH - Medications reviewed and updated in medication list.  Smoking Status noted in VS form    Habits & Providers  Alcohol-Tobacco-Diet     Tobacco Status: never  Current Medications (verified): 1)  Hydrochlorothiazide 25 Mg Tabs (Hydrochlorothiazide) .... Take 1 Tablet By Mouth Every Morning 2)  Losartan Potassium 50 Mg Tabs (Losartan Potassium) .Marland Kitchen.. 1 By Mouth A Day For Blood Pressure 3)  Zolpidem Tartrate 10 Mg Tabs (Zolpidem Tartrate) .... 1/2 Tablet At Bedtime As Needed Not More Than 2-3 Nighte Per Week  Allergies: 1)  Septra 2)  Tetracycline Hcl (Tetracycline Hcl)  Physical Exam  General:  Well-developed,well-nourished,in no acute distress; alert,appropriate and cooperative throughout examination   Impression & Recommendations:  Problem # 1:  IRREGULAR MENSES (ICD-626.4) Has had irregular periods for years.  Had endometrial biopsy in 2008.  Was on BCP which helped but had to stop when had intraductal hyperplasia. Will check CBC  [place] and TSH.  Given her age she is at risk for  endometrial ca and since she is off work now after shoulder surgery so would like endometrial biopsy asap so will schedule with GYN clinic.  Orders: CBC-[facility] (36644) TSH-[facility] ([id number]) Ultrasound (Ultrasound) [facility]- Est Level  3 (38756)  Problem # 2:  KIDNEY CYST, ACQUIRED (ICD-593.2) has repeat MRI next month  Problem # 3:  FRACTURE, ANKLE, LEFT (ICD-824.8) may need repeat surgery has appointment in next few weeks   Complete Medication List: 1)  Hydrochlorothiazide 25 Mg Tabs (Hydrochlorothiazide) .... Take 1 tablet by mouth every morning 2)  Losartan Potassium 50 Mg Tabs (Losartan potassium) .Marland Kitchen.. 1 by mouth a day for blood pressure 3)  Zolpidem Tartrate 10 Mg Tabs (Zolpidem tartrate) .... 1/2 tablet at bedtime as needed not more than 2-3 nighte per week  Patient Instructions: 1)  Sign up for GYN Clinic this Thursday at the front desk 2)  I will call you about the Ultrasound and your labs   Orders Added: 1)  CBC-[facility] [85027] 2)  TSH-[facility] [[id number]] 3)  Ultrasound [Ultrasound] 4)  [facility]- Est Level  3 [[id number]]

## 2010-06-03 NOTE — Consult Note (Signed)
[facility]: [facility]   Imported By: De Nurse 05/28/2010 15:20:22  _____________________________________________________________________  External Attachment:    Type:   Image     Comment:   External Document

## 2010-06-09 NOTE — Progress Notes (Signed)
Summary: results  Phone Note Call from Patient Call back at Home Phone [phone]   Caller: Patient Summary of Call: pt is wanting to know results of xrays Initial call taken by: De Nurse,  May 31, 2010 10:44 AM  Follow-up for Phone Call        Needs to have endometrial biopsy Please schedule in Procedure GYN clinic and notify patient thanks  [facility] Follow-up by: Pearlean Brownie MD,  May 31, 2010 4:15 PM

## 2010-06-17 ENCOUNTER — Ambulatory Visit: Payer: Self-pay

## 2010-06-24 ENCOUNTER — Ambulatory Visit: Payer: Self-pay

## 2010-07-04 ENCOUNTER — Other Ambulatory Visit: Payer: Self-pay | Admitting: Family Medicine

## 2010-07-05 NOTE — Telephone Encounter (Signed)
Refill request

## 2010-07-08 ENCOUNTER — Ambulatory Visit (INDEPENDENT_AMBULATORY_CARE_PROVIDER_SITE_OTHER): Payer: BC Managed Care – PPO | Admitting: Family Medicine

## 2010-07-08 ENCOUNTER — Encounter: Payer: Self-pay | Admitting: Family Medicine

## 2010-07-08 ENCOUNTER — Other Ambulatory Visit: Payer: Self-pay | Admitting: Family Medicine

## 2010-07-08 VITALS — BP 132/76 | HR 55 | Temp 98.3°F | Ht 67.5 in | Wt 230.0 lb

## 2010-07-08 DIAGNOSIS — N949 Unspecified condition associated with female genital organs and menstrual cycle: Secondary | ICD-10-CM

## 2010-07-08 DIAGNOSIS — N938 Other specified abnormal uterine and vaginal bleeding: Secondary | ICD-10-CM

## 2010-07-08 NOTE — Progress Notes (Signed)
  Subjective:    Patient ID: [name], female    DOB: 28-Mar-1966, 45 y.o.   MRN: [id number]  HPI  Sent for endometrial biopsy for further eval of vaginal bleding. Long hx of irregular menses which was controlled for a time by OCPs. Had to stop those after she was dx with intraductal hyperplasia of tehbreast.  Review of Systems  Constitutional: Negative for fever, fatigue and unexpected weight change.       Objective:   Physical Exam    GU externally normal. No lesions or discharge. Patient given informed consent, signed copy in the chart. Appropriate time out taken. Sterile technique was used. The patient was placed in the lithotomy position and the cervix brought into view with sterile speculum. Portio of cervix cleansed x 3 with betadine swabs. A tenaculum was placed in the anterior lip of the cervix. The uterus was sounded for depth. A pipelle was introduced to into the uterus, suction created,  and an endometrial sample was obtained. All equipment was removed and accounted for.The patient tolerated the procedure well. Minimal spotting type bleeding. Patient given post procedure instructions. We will notify her of pathology results.   Assessment & Plan:  Best number [phone] Dysfunctional uterine bleeding with hx of ductal hyperplasia breast, no longer able to use OCP Endo bx today and await path. If negative, could possibly consider provera 5 days a month

## 2010-07-09 ENCOUNTER — Encounter: Payer: Self-pay | Admitting: Family Medicine

## 2010-07-12 ENCOUNTER — Encounter: Payer: Self-pay | Admitting: Family Medicine

## 2010-07-12 ENCOUNTER — Telehealth: Payer: Self-pay | Admitting: Family Medicine

## 2010-07-12 NOTE — Telephone Encounter (Signed)
Pt returned and would like to have call back to discuss other options.

## 2010-07-12 NOTE — Telephone Encounter (Signed)
Left mssg as above

## 2010-07-13 ENCOUNTER — Encounter: Payer: Self-pay | Admitting: Family Medicine

## 2010-07-13 ENCOUNTER — Telehealth: Payer: Self-pay | Admitting: Family Medicine

## 2010-07-13 DIAGNOSIS — N938 Other specified abnormal uterine and vaginal bleeding: Secondary | ICD-10-CM

## 2010-07-13 NOTE — Telephone Encounter (Signed)
Faxed to [facility] clinic [phone]

## 2010-07-13 NOTE — Telephone Encounter (Signed)
Dear [facility] A[facility] Team Please set up a GYN appointment for her DX: dysfunctional uterine bleeding--I would like them to see her for evaluation of endometrial ablation or possibly hysterectomy I will put order in and do letter and bring you letter [facility]! Duc Crocket

## 2010-07-15 LAB — CBC
MCH: 26.7 pg (ref 26.0–34.0)
MCV: 78.6 fL (ref 78.0–100.0)
Platelets: 276 10*3/uL (ref 150–400)
RDW: 15.8 % — ABNORMAL HIGH (ref 11.5–15.5)
WBC: 4.7 10*3/uL (ref 4.0–10.5)

## 2010-07-15 NOTE — Telephone Encounter (Signed)
I spoke w her and have sent referral order through for GYN eval--possible endometrial ablation or hysterectomy

## 2010-08-10 LAB — DIFFERENTIAL
Basophils Relative: 1 % (ref 0–1)
Eosinophils Absolute: 0.1 10*3/uL (ref 0.0–0.7)
Neutrophils Relative %: 59 % (ref 43–77)

## 2010-08-10 LAB — BASIC METABOLIC PANEL
BUN: 9 mg/dL (ref 6–23)
CO2: 29 mEq/L (ref 19–32)
Chloride: 102 mEq/L (ref 96–112)
Creatinine, Ser: 0.92 mg/dL (ref 0.4–1.2)

## 2010-08-10 LAB — CBC
MCHC: 34.7 g/dL (ref 30.0–36.0)
MCV: 80.2 fL (ref 78.0–100.0)
Platelets: 320 10*3/uL (ref 150–400)
RDW: 14.5 % (ref 11.5–15.5)

## 2010-08-12 ENCOUNTER — Encounter: Payer: BC Managed Care – PPO | Admitting: Obstetrics & Gynecology

## 2010-08-12 ENCOUNTER — Other Ambulatory Visit: Payer: Self-pay | Admitting: Family

## 2010-08-12 ENCOUNTER — Encounter (INDEPENDENT_AMBULATORY_CARE_PROVIDER_SITE_OTHER): Payer: BC Managed Care – PPO | Admitting: Family

## 2010-08-12 DIAGNOSIS — N920 Excessive and frequent menstruation with regular cycle: Secondary | ICD-10-CM

## 2010-08-12 DIAGNOSIS — Z01818 Encounter for other preprocedural examination: Secondary | ICD-10-CM

## 2010-08-12 DIAGNOSIS — D259 Leiomyoma of uterus, unspecified: Secondary | ICD-10-CM

## 2010-08-12 LAB — POCT PREGNANCY, URINE: Preg Test, Ur: NEGATIVE

## 2010-08-13 NOTE — Progress Notes (Signed)
[name], [name]               ACCOUNT NO.:  [id number]  MEDICAL RECORD NO.:  [id number]           PATIENT TYPE:  A  LOCATION:  [facility]                   FACILITY:  [facility]  PHYSICIAN:  Jaynie Collins, MD     DATE OF BIRTH:  18-Jan-1966  DATE OF SERVICE:  08/12/2010                                 CLINIC NOTE  REASON FOR VISIT:  Preoperative consultation.  HISTORY OF PRESENT ILLNESS:  The patient is a 45 year old gravida 3, para 2-0-1-2 with last menstrual period of August 08, 2010 who has a long history of dysfunctional uterine bleeding.  The patient was referred from the [facility] for surgical consultation for this problem.  She did have an ultrasound in January that showed an 8-week sized uterus, 2 small subserosal fibroids, one measuring 2.2 cm in maximum diameter and the other one measure 1.4 cm in maximum diameter. Endometrium was noted to measure 7 mm and she had normal adnexa.  The patient went on to have an endometrial biopsy on July 08, 2010 which showed secretory type endometrium and benign endocervical polyp.  No evidence of hyperplasia or malignancy.  At this point, the patient is not interested in a hysterectomy.  She does have a history of atypical breast ductal hyperplasia and as such cannot have any hormonal therapy. She wants to know what her other options are.  The patient denies any other gynecologic concerns.  PAST MEDICAL HISTORY: 1. Hypertension. 2. Atypical breast ductal hyperplasia. 3. Obesity. 4. Osteoarthritis. 5. GERD.  PAST SURGICAL HISTORY:  Surgeries and lumpectomies of her breast, ankle surgery, foot surgery, and shoulder surgery.  MEDICATIONS: 1. Hydrochlorothiazide 25 mg daily. 2. Losartan 50 mg daily. 3. Vitamin E daily. 4. Low-dose aspirin daily.  ALLERGIES:  SEPTRA and TETRACYCLINE.  PAST OB/GYN HISTORY:  The patient has had 2 vaginal deliveries, 1 miscarriage.  She has dysfunctional uterine bleeding.  She has had  a tubal ligation.  The patient's last Pap smear was in 2010 and was normal.  The patient does have a past history of cervical dysplasia, but none recently.  She does have a history of fibroids and ovarian cyst in the past.  No history of sexually transmitted infections.  SOCIAL HISTORY:  The patient works in a warehouse.  She does not have any smoking, alcohol, or illicit drug habits.  She denies any current history of sexual physical abuse.  FAMILY HISTORY:  Remarkable for an extensive history of diabetes, heart disease, and high blood pressure.  PHYSICAL EXAMINATION:  VITAL SIGNS:  Temperature is 98.6, pulse 80, blood pressure 137/79, weight 226 pounds, and height 5 feet 7.5 inches. GENERAL:  No apparent distress. LUNGS:  Clear to auscultation bilaterally. HEART:  Regular rate and rhythm. ABDOMEN:  Soft, nontender, and nondistended. PELVIC:  Normal external female genitalia.  On bimanual exam, the patient has a small uterus and normal adnexa bilaterally.  ASSESSMENT AND PLAN:  The patient is a 45 year old gravida 3, para 2-0-1- 2 here for preoperative consultation and treatment of her dysfunctional uterine bleeding.  The patient is known to have subserosal fibroids. She does not want any hormonal management secondary to  her history of atypical ductal hyperplasia of the breast.  The patient was given the option of endometrial ablation and this procedure was discussed in detail.  Also discussed the three common modalities that were used, hydrothermal ablation, ThermaChoice, and Novasure and discussed each one in detail.  The patient wants to proceed with the hydrothermal ablation. The risks of the procedure were discussed with the patient including but not limited to bleeding, infection, injury to uterus or surrounding organs, burns to vagina, need for additional procedures and all her questions were answered.  She was given patient and education pamphlets of hydrothermal ablation  and Novasure to review at home and she was told that she will be contacted by our surgical scheduler regarding the time and date of her surgery.  Bleeding precautions were reviewed.          ______________________________ Jaynie Collins, MD    UA/MEDQ  D:  08/12/2010  T:  08/13/2010  Job:  [id number]

## 2010-09-14 NOTE — Op Note (Signed)
[name], [name]               ACCOUNT NO.:  [id number]   MEDICAL RECORD NO.:  [id number]          PATIENT TYPE:  OIB   LOCATION:  5118                         FACILITY:  [facility]   PHYSICIAN:  Juanetta Gosling, MDDATE OF BIRTH:  1965/06/03   DATE OF PROCEDURE:  09/10/2008  DATE OF DISCHARGE:                               OPERATIVE REPORT   PREOPERATIVE DIAGNOSIS:  Left breast mammographic abnormality with  abnormal core biopsy.   POSTOPERATIVE DIAGNOSIS:  Left breast mammographic abnormality with  abnormal core biopsy.   PROCEDURE:  Left breast wire localization biopsy.   SURGEON:  Juanetta Gosling, MD   ASSISTANT:  None.   ANESTHESIA:  General.   FINDINGS:  Mammographic confirmation of removal of both lesions as well  as prior clip.   SPECIMENS:  Breast tissue oriented pathology.   ESTIMATED BLOOD LOSS:  Minimal.   COMPLICATIONS:  None.   SUPERVISING ANESTHESIOLOGIST:  Sheldon Silvan, MD   DISPOSITION:  To recovery room in stable condition.   INDICATIONS:  The patient is a 45 year old female with a history of an  abnormal mammogram at age 84 presented for a routine screening mammogram  with some microcalcification noted on her left breast.  She underwent a  biopsy, which showed fibrocystic change with microcalcifications  consistent with a complex sclerosing  lesion.  She was then referred for  a wire localization biopsy.   PROCEDURE IN DETAIL:  After informed consent was obtained, the patient  was taken to the operating room.  She first had a wire placed at the  [facility] by Dr. Deboraha Sprang.  Her mammograms were available  for review in the operating room.  She having administered 1 g of  intravenous cefazolin, sequential compression devices were placed on  lower extremities prior to operation.  She was placed under general  anesthesia without complication.  The wire was then clipped.  Her breast  was then prepped with ChloraPrep, 3 minutes were  allowed to pass.  She  was then draped in standard sterile surgical fashion.  Surgical timout  was then performed.  Her wire was in fairly deep and I picked a point midway upon the wire  and made about a 4.5-cm radial incision on the upper outer portion of  her left breast.  I carried this down to the level of the wire and then  removed a cone of breast tissue that was wider around the area where the  lesions were and then including the entire wire.  This was passed off  the table as a specimen.  Dr. Deboraha Sprang confirmed removal of this on  mammogram.  Hemostasis was observed.  I closed her deep layer with 2-0  Vicryl, I closed her dermis with 3-0 Vicryl running suture, and her skin  with a 4-0 Monocryl in a subcuticular fashion.  Steri-Strips and sterile  dressing were placed over this.  11 mL of 0.25% Marcaine were then  infiltrated into the wound and into the cavity.  She tolerated this  well, was extubated in the operating room, and transferred to the  recovery room  in stable condition.      Juanetta Gosling, MD  Electronically Signed     MCW/MEDQ  D:  09/10/2008  T:  09/11/2008  Job:  [id number]   cc:   Elba Barman, MD

## 2010-09-14 NOTE — Op Note (Signed)
NAME:  [name], [name]                 ACCOUNT NO.:  [id number]   MEDICAL RECORD NO.:  [id number]          PATIENT TYPE:  AMB   LOCATION:  [facility]SC                          FACILITY:  [facility]   PHYSICIAN:  Katy Fitch. Sypher, M.D. DATE OF BIRTH:  21-Mar-1966   DATE OF PROCEDURE:  04/18/2008  DATE OF DISCHARGE:                               OPERATIVE REPORT   PREOPERATIVE DIAGNOSES:  1. Entrapment neuropathy left median nerve at carpal tunnel.  2. Recurrent dorsal wrist pain status post prior resection of a cold      dorsal ganglion from scaphotrapezoid articulation performed in      March 2007 with palpable mass consistent with either recurrent      ganglion or osteophyte formation.   POSTOPERATIVE DIAGNOSES:  Scarring of dorsal wrist capsule and  osteophyte formation at proximal pole of scaphoid and posterior  interosseous neuroma in continuity.   OPERATION:  1. Right transverse carpal ligament release.  2. Exploration of dorsal wrist capsule with inspection of midcarpal      joint, radiocarpal joint, radiocarpal arthrotomy, debridement of      osteophyte at proximal pole of scaphoid and resection of posterior      interosseous nerve.   SURGEON:  Katy Fitch. Sypher, MD   ASSISTANT:  Annye Rusk PA-C   ANESTHESIA:  General by LMA.   SUPERVISING ANESTHESIOLOGIST:  Kaylyn Layer. Michelle Piper, MD   INDICATIONS:  [name] is a 45 year old woman who is well  acquainted with our practice.  In March 2007, we removed a painful  occult dorsal ganglion from her left wrist.  She had good relief  following the procedure.  Her ganglion was documented with preoperative  MRI.   She subsequently has developed hand numbness and had electrodiagnostic  studies confirming bilateral carpal tunnel syndrome.  She was planning  on proceeding with left carpal tunnel release and requested that we  reexplore the dorsum of her wrist as she was having recurrent dorsal  pain.  On careful palpation of her dorsal wrist,  she was nontender at  [employer], nontender over the fourth dorsal compartment but was  exquisitely tender over the proximal pole of her scaphoid.  It appeared  that she may be developing recurrent ganglion or an osteophyte.  We  advised that we would explore this area at the time of her carpal tunnel  release.   Preoperatively, she was advised of the potential risks and benefits of  surgery.   PROCEDURE:  [name] is brought to the operating room and placed  in supine position upon the operating table.   Following the induction of general anesthesia by LMA technique, the left  arm was prepped with Betadine soap solution and sterilely draped.  Following exsanguination of the limb with an Esmarch bandage, the  arterial tunnel was inflated to 220 mmHg.  The procedure commenced with  a short incision in the line of the ring finger and the palm.  Subcutaneous tissues were carefully divided revealing the palmar fascia.  This was split longitudinally to reveal the common sensory branch of the  median nerve.  These were followed back to the transverse carpal  ligament which was gently isolated from the median nerve.  The ligament  was then released along its ulnar border extend being into the distal  forearm.  This widely opened the carpal canal.  No mass or other  predicaments were noted.   The wound was then repaired with intradermal 3-0 Prolene suture.   Attention was then directed to the dorsal aspect of the wrist.  The  prior surgical scar was resected.  Subcutaneous tissues were  meticulously dissected, taking care to retract the radial, superficial  and sensory branches.  The extensor retinaculum was split in its distal  centimeter to allow exposure of the fourth dorsal compartment and  retraction of the tendon as a second dorsal compartment.   A detailed exploration of the dorsal wrist capsule extending from the  scaphotrapezoid joint proximally to the midcarpal joint  and the  radiocarpal joint was undertaken.  There was a firm mass along the  proximal pole of the scaphoid and dorsoradial aspect of the scapholunate  ligament.  The capsule was entered and what appeared to be a reactive  osteophyte was noted to be forming on the dorsum of the scaphoid.  The  posterior interosseous nerve in this region was thickened and fibrotic  in appearance.  Given Ms. Smith's extreme tenderness preoperatively, it  is likely that she was experiencing posterior interosseous neuroma in  continuity due to the osteophyte.   We carefully exposed the fourth dorsal compartment, retracted the  extensor tendons and resected a 1 cm segment of the posterior  interosseous nerve.  The wound was then inspected for bleeding points  followed by repair of the subcutaneous tissue with 4-0 Vicryl and repair  of the skin with intradermal 3-0 Prolene.  Careful palpation of the  scaphotrapeziotrapezoid joint region and carpometacarpal joint region  was accomplished without identification of a cyst.   The wound was then dressed with Xeroflo, sterile gauze and a volar  plaster splint.  There were no apparent complications.      Katy Fitch Sypher, M.D.  Electronically Signed     RVS/MEDQ  D:  04/18/2008  T:  04/18/2008  Job:  [id number]

## 2010-09-17 NOTE — Op Note (Signed)
NAME:  CHARELL, FAULK                 ACCOUNT NO.:  [id number]   MEDICAL RECORD NO.:  [id number]          PATIENT TYPE:  AMB   LOCATION:  [facility]                          FACILITY:  [facility]   PHYSICIAN:  Katy Fitch. Sypher, M.D. DATE OF BIRTH:  1965/12/26   DATE OF PROCEDURE:  07/19/2005  DATE OF DISCHARGE:                                 OPERATIVE REPORT   PREOP DIAGNOSIS:  Painful mass dorsal aspect left wrist consistent with  occult ganglion with MRI proven subtendinous ganglion adjacent to  carpometacarpal joint of index metacarpal, long metacarpal and capitate.   POSTOP DIAGNOSIS:  A ganglion ulnar to extensor carpi radialis brevis that  apparently was originating from the mid carpal arch and may have had a  connection to the long finger carpometacarpal joint. A clear sinus tract  could not be identified to either the index or long finger carpometacarpal  joint.   OPERATIONS:  Resection of occult dorsal ganglion.   OPERATIONS:  Josephine Igo, MD   ASSISTANT:  Annye Rusk PA-C.   ANESTHESIA:  General by LMA, supervising anesthesiologist is Dr. Sampson Goon.   INDICATIONS:  [name] is a 45 year old woman referred through the  courtesy of the [facility] for evaluation and  management of a mass in the dorsal aspect of her left wrist and chronic  pain.   Clinical examination revealed a well-appearing 45 year old woman. Inspection  of her hand revealed no visible or palpable masses.   From the description of her pain and behavior of her hand, we had a strong  suspicion that she had an occult dorsal ganglion.   Plain films of her wrist and hand demonstrated a carpometacarpal boss at the  base of the long finger metacarpal, where it articulates with the capitate.   An MRI was obtained and indeed she was noted have 1.2 cm diameter ganglion  ulnar and deep to the extensor carpi radialis brevis tendon insertion.   She did not show signs of dissociative carpal  instability. However, there  was some edema at the index metacarpal base thought to be reactive.   She requested excision of the ganglion for pain relief.   Preoperatively she was carefully counseled that given the findings of  carpometacarpal boss and the presence of occult ganglion, we could not  guarantee that excision would resolve a ganglion process completely.   Past extensive experience with carpometacarpal boss formation has shown a  proclivity to develop ganglion adjacent to these osteophytes.   Accumulated experience has also shown the resection of carpal-metacarpal  bosses is very uneven in its outcome and often times arthrodesis of the long  finger carpometacarpal joint and/or index finger carpometacarpal joint is  necessary to completely resolve this process.   After thoroughly advising Ms. Katrinka Blazing of the circumstances, we recommended  proceeding with simple ganglion excision at this time and identification of  a sinus tract if possible.   If we found clear involvement of the carpometacarpal joint, we would  consider resection of the boss.   After informed consent, she is brought to the operating room at  this time.   PROCEDURE:  Jackeline Gutknecht is brought to the operating room and placed in  supine position on the operating table.   Following the induction of general anesthesia by LMA, left arm was prepped  with Betadine soap solution, sterilely draped. A pneumatic tourniquet was  applied to the proximal brachium.   Following exsanguination, left arm with an Esmarch bandage, the arterial  tourniquet was inflated to 220 mmHg later elevated to 440 mmHg due to mild  systolic hypertension.   Procedure commenced with a short transverse incision directly over the  radial wrist extensors.   The subcutaneous tissues were carefully divided taking care to identify the  extensor pollicis longus, extensor carpi radialis longus and the extensor  carpi radialis brevis.   The  wrist capsule deep to these tendons was carefully inspected and beneath  the insertion of the extensor carpi radialis brevis along its ulnar border,  we identified the ganglion that was evident on the MRI.   Palpation of the adjacent carpometacarpal joint revealed the site of the  carpometacarpal boss.   Careful dissection the ganglion did not reveal a sinus tract communicating  with the carpometacarpal joint.  Rather it appeared that the ganglion was  forming adjacent to the mid carpal arch dorsally and appeared to extend  proximally with a thin cord or anlage consistent with a ganglion stem.   This was followed to the midcarpal joint where was resected and its base  electrocauterized.   There did not appear to be any typical ganglion formation from the region of  the scapholunate interosseous ligament.   The dorsal carpal arch was carefully inspected and preserved. The index  carpometacarpal joint and the long finger carpometacarpal joint were  inspected and found to have a moderately prominent osteophytes without  obvious ganglion formation or sinus tract communicating to the site of the  main cyst.   In my judgment interfering with the biology of the carpometacarpal joint at  this point in time was not indicated. The osteophytes were left intact.   The wound was irrigated. Bleeding points were cauterized followed by repair  the skin with subdermal sutures of 4-0 Vicryl and intradermal 3-0 Prolene.   There no apparent complications.      Katy Fitch Sypher, M.D.  Electronically Signed     RVS/MEDQ  D:  07/19/2005  T:  07/20/2005  Job:  [id number]

## 2010-10-11 ENCOUNTER — Other Ambulatory Visit: Payer: Self-pay | Admitting: Obstetrics & Gynecology

## 2010-10-11 ENCOUNTER — Encounter (HOSPITAL_COMMUNITY): Payer: BC Managed Care – PPO

## 2010-10-11 LAB — CBC
HCT: 34.6 % — ABNORMAL LOW (ref 36.0–46.0)
MCV: 76 fL — ABNORMAL LOW (ref 78.0–100.0)
Platelets: 321 10*3/uL (ref 150–400)
RBC: 4.55 MIL/uL (ref 3.87–5.11)
WBC: 6.3 10*3/uL (ref 4.0–10.5)

## 2010-10-11 LAB — BASIC METABOLIC PANEL
BUN: 8 mg/dL (ref 6–23)
CO2: 27 mEq/L (ref 19–32)
Chloride: 99 mEq/L (ref 96–112)
Creatinine, Ser: 0.74 mg/dL (ref 0.4–1.2)

## 2010-10-12 ENCOUNTER — Telehealth: Payer: Self-pay | Admitting: *Deleted

## 2010-10-12 DIAGNOSIS — I10 Essential (primary) hypertension: Secondary | ICD-10-CM

## 2010-10-12 DIAGNOSIS — E876 Hypokalemia: Secondary | ICD-10-CM

## 2010-10-12 MED ORDER — SPIRONOLACTONE 25 MG PO TABS: 25.0000 mg | ORAL_TABLET | Freq: Every day | ORAL | Status: DC

## 2010-10-12 NOTE — Telephone Encounter (Signed)
Received call from  Bing Ree RN , [facility] . Patient recently had pre op labs and potassium is 2.6   Dr. Malen Gauze the anesthesiologist wants MD to prescribe potassium supplement  so that her potassium will be in a normal range for surgery that is scheduled for 10/18/2010. Patient states that she has had potassium in past and could not take , wants a liquid potassium. Pharmacy is [facility] [address]. Will sned message to Dr. Jennette Kettle.

## 2010-10-12 NOTE — Telephone Encounter (Signed)
Patient given message from  Dr. Jennette Kettle. She will come in tomorrow afternoon for lab

## 2010-10-12 NOTE — Telephone Encounter (Addendum)
Called patient's work place and was told she cannot come to phone. Was able to leave a message at work  to have patient call me back.  Also left message on home phone #.   called [facility] and gave message to Bing Ree of Dr. Donnetta Hail instructions.

## 2010-10-12 NOTE — Telephone Encounter (Signed)
[name] Have her STOP the HCTZ, start a new med I am calling in--spironolactone --in it's place. Come for a potassium check HERE Wed pm or THurs am--no need to fast THANKS! Denny Levy

## 2010-10-13 ENCOUNTER — Other Ambulatory Visit: Payer: BC Managed Care – PPO

## 2010-10-13 ENCOUNTER — Telehealth: Payer: Self-pay | Admitting: Family Medicine

## 2010-10-13 DIAGNOSIS — I10 Essential (primary) hypertension: Secondary | ICD-10-CM

## 2010-10-13 LAB — BASIC METABOLIC PANEL
CO2: 26 mEq/L (ref 19–32)
Calcium: 8.7 mg/dL (ref 8.4–10.5)
Chloride: 103 mEq/L (ref 96–112)
Glucose, Bld: 87 mg/dL (ref 70–99)
Sodium: 139 mEq/L (ref 135–145)

## 2010-10-13 NOTE — Telephone Encounter (Signed)
Med she is referring to is spironolactone (see previous phone note). Message to MD.

## 2010-10-13 NOTE — Progress Notes (Signed)
Bmp done today [name] 

## 2010-10-13 NOTE — Telephone Encounter (Signed)
Wants to make sure that the water pill that was prescribed yesterday doesn't have any hormones in it or anything else she isn't supposed to have in her body.  She is at work and said that you can leave a message answering her question.

## 2010-10-14 NOTE — Telephone Encounter (Signed)
Called and left message on voicemail that Rx does not have hormones.

## 2010-10-14 NOTE — Telephone Encounter (Signed)
Pt is needing to speak with nurse - she has not heard from anyone.

## 2010-10-15 ENCOUNTER — Telehealth: Payer: Self-pay | Admitting: Family Medicine

## 2010-10-15 NOTE — Telephone Encounter (Signed)
Dear [facility] Team Please call her and tell her that her potassium is IMPROVED. I would contiue the spironolactone in place of HCTZ. THANKS! Denny Levy

## 2010-10-15 NOTE — Telephone Encounter (Signed)
Called and spoke with pt and told her what Dr. Jennette Kettle stated concerning potassium and to cont  the spironolactone in place of HCTZ. Pt agreeable.Loralee Pacas [place]

## 2010-10-18 ENCOUNTER — Other Ambulatory Visit: Payer: Self-pay | Admitting: Obstetrics & Gynecology

## 2010-10-18 ENCOUNTER — Ambulatory Visit (HOSPITAL_COMMUNITY)
Admission: RE | Admit: 2010-10-18 | Discharge: 2010-10-18 | Disposition: A | Discharge status: Home / Self Care | Payer: BC Managed Care – PPO | Source: Ambulatory Visit | Attending: Obstetrics & Gynecology | Admitting: Obstetrics & Gynecology

## 2010-10-18 DIAGNOSIS — Z01812 Encounter for preprocedural laboratory examination: Secondary | ICD-10-CM

## 2010-10-18 DIAGNOSIS — N938 Other specified abnormal uterine and vaginal bleeding: Secondary | ICD-10-CM | POA: Insufficient documentation

## 2010-10-18 DIAGNOSIS — N949 Unspecified condition associated with female genital organs and menstrual cycle: Secondary | ICD-10-CM | POA: Insufficient documentation

## 2010-10-18 DIAGNOSIS — D252 Subserosal leiomyoma of uterus: Secondary | ICD-10-CM

## 2010-10-18 DIAGNOSIS — Z01818 Encounter for other preprocedural examination: Secondary | ICD-10-CM | POA: Insufficient documentation

## 2010-10-18 HISTORY — PX: ENDOMETRIAL ABLATION: SHX621

## 2010-10-18 LAB — PREGNANCY, URINE: Preg Test, Ur: NEGATIVE

## 2010-10-30 DIAGNOSIS — D259 Leiomyoma of uterus, unspecified: Secondary | ICD-10-CM

## 2010-10-30 DIAGNOSIS — N6099 Unspecified benign mammary dysplasia of unspecified breast: Secondary | ICD-10-CM

## 2010-10-30 DIAGNOSIS — N938 Other specified abnormal uterine and vaginal bleeding: Secondary | ICD-10-CM

## 2010-10-31 NOTE — Op Note (Signed)
[name], [name]               ACCOUNT NO.:  [id number]  MEDICAL RECORD NO.:  [id number]  LOCATION:  [facility]                          [facility]ILITY[facility]  [facility]  PHYSICIAN:  Horton Chin, MD DATE OF BIRTH:  07-28-1965  DATE OF PROCEDURE:  10/18/2010 DATE OF DISCHARGE:                              OPERATIVE REPORT   PREOPERATIVE DIAGNOSES:  Dysfunctional uterine bleeding and uterine fibroids.  POSTOPERATIVE DIAGNOSES:  Dysfunctional uterine bleeding and uterine fibroids.  PROCEDURE:  Hysteroscopic hydrothermal ablation, dilation and curettage.  SURGEON:  Horton Chin, MD.  ANESTHESIOLOGIST:  Dr. Arby Barrette  ANESTHESIA:  General.  IV FLUIDS:  700 mL.  ESTIMATED BLOOD LOSS:  Minimal.  INDICATION:  This patient is a 45 year old gravida 3, para 2-0-1-2 with a long history of dysfunctional uterine bleeding.  She had an ultrasound that showed an 8 week size uterus with 2 small subserosal fibroids measuring about 2 cm and 1 cm in maximum diameter.  Endometrial biopsy was negative without evidence of hyperplasia or malignancy.  The patient does have a history of atypical breast ductal hyperplasia.  She opted for endometrial ablation, prior to surgery the risks of procedure were explained to her including but not limited to bleeding, infection, injury to surrounding organs, the uterus, need for additional procedures, and written informed consent was obtained.  FINDINGS:  Mildly proliferative diffuse endometrium.  Uterus sounded to 7 cm.  Normal ostia retroverted uterus.  SPECIMENS:  Endometrial curettings which were sent to Pathology.  COMPLICATIONS:  None immediate.  PROCEDURE DETAILS:  The patient was taken to the operating room, where general analgesia was administered and found to be adequate.  She was then placed in a dorsal lithotomy position and prepped and draped in a sterile manner.  Her bladder was catheterized for unmeasured amount of clear yellow urine.  After an  adequate time-out was performed, the vaginal speculum was placed.  Her cervix was grasped with tenaculum and a paracervical block was administered using 30 mL of 0.5% Marcaine.  At this point, the cervix was dilated and the uterus was sounded to 7 cm. Her cervix was serially dilated to accommodate the hysteroscopic apparatus, sharp curettage was done at this point to obtain small amount of endometrial curettings, and the hysteroscope was advanced into the uterine fundus.  Normal anatomy was visualized wit hproflirative endometrium.  The hypothermal ablation was then carried out according to protocol.  There were no complications.  The fluid was then cooled and the instrument was removed from the patient's pelvis.  All instruments were then removed.  The patient had good blanching effect of the endometrium.  The patient tolerated procedure well.  Sponge, instrument, and needle counts were correct x2.  She was taken to recovery room in stable condition.  DISPOSITION:  The patient was given prescriptions for Percocet, ibuprofen, and Colace 30 tablets each and routine postoperative instructions were given to the patient.  A postoperative appointment has been made with the patient on November 17, 2010 at 3:45 p.m. for postoperative check.     Horton Chin, MD     UAA/MEDQ  D:  10/18/2010  T:  10/19/2010  Job:  [id number]  Electronically Signed by  Jaynie Collins MD on 10/31/2010 10:07:41 AM

## 2010-11-10 ENCOUNTER — Telehealth: Payer: Self-pay | Admitting: Family Medicine

## 2010-11-10 NOTE — Telephone Encounter (Signed)
The prescription for Spironolactone needs to be written and filled in 90 day supply in order for the insurance to cover it.  [facility] [address].  Please call when this has been done.

## 2010-11-11 MED ORDER — SPIRONOLACTONE 25 MG PO TABS: 25.0000 mg | ORAL_TABLET | Freq: Every day | ORAL | Status: DC

## 2010-11-11 NOTE — Telephone Encounter (Signed)
Done. pls let her know

## 2010-11-11 NOTE — Telephone Encounter (Signed)
Left message on voicemail informing patient.  

## 2010-11-17 ENCOUNTER — Ambulatory Visit (INDEPENDENT_AMBULATORY_CARE_PROVIDER_SITE_OTHER): Payer: BC Managed Care – PPO | Admitting: Obstetrics & Gynecology

## 2010-11-17 ENCOUNTER — Ambulatory Visit: Payer: BC Managed Care – PPO | Admitting: Obstetrics & Gynecology

## 2010-11-17 ENCOUNTER — Encounter: Payer: Self-pay | Admitting: Obstetrics & Gynecology

## 2010-11-17 DIAGNOSIS — E669 Obesity, unspecified: Secondary | ICD-10-CM | POA: Insufficient documentation

## 2010-11-17 NOTE — Progress Notes (Signed)
  Subjective:    Patient ID: [name], female    DOB: November 03, 1965, 44 y.o.   MRN: [id number]  HPI  [name] is status post an endometrial ablation by Dr. Lynetta Mare about 4 weeks ago.  She has no complaints.  She had about 1 day of spotting on POD #1 but no vaginal bleeding since then.  She has not resumed sexual intercourse.  Review of Systems negative    Objective:   Physical Exam     Cervix- well healed   Assessment & Plan:  Post op ablation to treat her diagnosis of DUB (fibroids).  She is doing well.  She will continue to get her annual exam at the [facility] clinic and will return here if her DUB returns.

## 2010-11-23 ENCOUNTER — Encounter (HOSPITAL_COMMUNITY)
Admission: RE | Admit: 2010-11-23 | Discharge: 2010-11-23 | Disposition: A | Discharge status: Home / Self Care | Payer: BC Managed Care – PPO | Source: Ambulatory Visit | Attending: Orthopedic Surgery | Admitting: Orthopedic Surgery

## 2010-11-23 ENCOUNTER — Other Ambulatory Visit (HOSPITAL_COMMUNITY): Payer: Self-pay | Admitting: Orthopedic Surgery

## 2010-11-23 ENCOUNTER — Ambulatory Visit (HOSPITAL_COMMUNITY)
Admission: RE | Admit: 2010-11-23 | Discharge: 2010-11-23 | Disposition: A | Discharge status: Home / Self Care | Payer: BC Managed Care – PPO | Source: Ambulatory Visit | Attending: Orthopedic Surgery | Admitting: Orthopedic Surgery

## 2010-11-23 DIAGNOSIS — M19079 Primary osteoarthritis, unspecified ankle and foot: Secondary | ICD-10-CM

## 2010-11-23 DIAGNOSIS — Z01818 Encounter for other preprocedural examination: Secondary | ICD-10-CM | POA: Insufficient documentation

## 2010-11-23 DIAGNOSIS — Z01812 Encounter for preprocedural laboratory examination: Secondary | ICD-10-CM | POA: Insufficient documentation

## 2010-11-23 LAB — CBC
HCT: 33.9 % — ABNORMAL LOW (ref 36.0–46.0)
Platelets: 288 10*3/uL (ref 150–400)
RBC: 4.59 MIL/uL (ref 3.87–5.11)
RDW: 15.5 % (ref 11.5–15.5)
WBC: 5.4 10*3/uL (ref 4.0–10.5)

## 2010-11-23 LAB — BASIC METABOLIC PANEL
BUN: 8 mg/dL (ref 6–23)
CO2: 24 mEq/L (ref 19–32)
Chloride: 105 mEq/L (ref 96–112)
GFR calc Af Amer: >60 mL/min (ref >60)
Potassium: 3.4 mEq/L — ABNORMAL LOW (ref 3.5–5.1)

## 2010-12-03 ENCOUNTER — Ambulatory Visit (HOSPITAL_COMMUNITY)
Admission: RE | Admit: 2010-12-03 | Discharge: 2010-12-05 | Disposition: A | Discharge status: Home / Self Care | Payer: BC Managed Care – PPO | Source: Ambulatory Visit | Attending: Orthopedic Surgery | Admitting: Orthopedic Surgery

## 2010-12-03 ENCOUNTER — Ambulatory Visit (HOSPITAL_COMMUNITY): Payer: BC Managed Care – PPO

## 2010-12-03 DIAGNOSIS — Z01810 Encounter for preprocedural cardiovascular examination: Secondary | ICD-10-CM | POA: Insufficient documentation

## 2010-12-03 DIAGNOSIS — M6240 Contracture of muscle, unspecified site: Secondary | ICD-10-CM | POA: Insufficient documentation

## 2010-12-03 DIAGNOSIS — Z01818 Encounter for other preprocedural examination: Secondary | ICD-10-CM | POA: Insufficient documentation

## 2010-12-03 DIAGNOSIS — M19079 Primary osteoarthritis, unspecified ankle and foot: Secondary | ICD-10-CM | POA: Insufficient documentation

## 2010-12-03 DIAGNOSIS — I10 Essential (primary) hypertension: Secondary | ICD-10-CM | POA: Insufficient documentation

## 2010-12-03 DIAGNOSIS — Z01812 Encounter for preprocedural laboratory examination: Secondary | ICD-10-CM | POA: Insufficient documentation

## 2010-12-03 LAB — TYPE AND SCREEN
ABO/RH(D): B POS
Antibody Screen: POSITIVE

## 2010-12-05 NOTE — Op Note (Signed)
[name], [name]               ACCOUNT NO.:  [id number]  MEDICAL RECORD NO.:  [id number]  LOCATION:  [facility]                         FACILITY:  [facility]  PHYSICIAN:  Toni Arthurs, MD        DATE OF BIRTH:  1965-06-15  DATE OF PROCEDURE:  12/03/2010 DATE OF DISCHARGE:                              OPERATIVE REPORT   PREOPERATIVE DIAGNOSES: 1. Left ankle arthritis. 2. Left gastrocnemius contracture.  POSTOPERATIVE DIAGNOSES: 1. Left ankle arthritis. 2. Left gastrocnemius contracture.  PROCEDURE: 1. Left total ankle replacement. 2. Left gastrocnemius recession. 3. Intraoperative interpretation of fluoroscopic imaging greater than     1 hour.  SURGEON:  Toni Arthurs, MD.  ANESTHESIA:  General, regional.  IV FLUIDS:  See anesthesia record.  ESTIMATED BLOOD LOSS:  Minimal.  TOURNIQUET TIME:  2 hours 30 minutes at 350 mmHg.  COMPLICATIONS:  None apparent.  DISPOSITION:  Extubated, awake, and stable to recovery.  IMPLANTS:  STAR total ankle system with a size extra small talar component, size medium tibial component, and 8-mm polyethylene spacer.  INDICATIONS FOR PROCEDURE:  The patient is a 45 year old female with past medical history significant for an ankle fracture approximately 2 years ago.  She went on to have significant post-traumatic arthritis. She has had her hardware removed.  She has tried bracing, activity modification, and anti-inflammatory medication without significant relief.  She presents now for left total ankle replacement.  She understands the risks and benefits of this treatment as well as the alternative treatment options and would like to proceed.  She specifically understands risks of bleeding, infection, nerve damage, blood clots, need for additional surgery, amputation, and death.  PROCEDURE IN DETAIL:  After preoperative consent was obtained, the correct operative site was identified, the patient was brought to the operating room and placed  supine on the operating table.  General anesthesia was induced.  Preoperative antibiotics were administered.  A surgical time-out was taken.  The left lower extremity was then prepped and draped in standard sterile fashion with tourniquet around the thigh. A longitudinal incision was marked in the medial aspect of the leg over the gastrocnemius tendon.  A second incision was marked on the anterior aspect of the ankle in the midline.  Extremity was exsanguinated and the tourniquet was inflated to 350 mmHg.  Medial incision was made.  Sharp dissection was carried down through the skin.  Blunt dissection was carried down through the subcutaneous tissue to superficial fascia, this was incised.  The gastrocnemius tendon was identified and along with plantaris tendon.  The plantaris was transected under direct vision. The gastrocnemius tendon was mobilized and isolated.  Care was taken to protect the sural nerve.  The gastrocnemius tendon was divided under direct vision from medial to lateral.  After dividing the gastrocnemius tendon, the patient had approximately 15 degrees of dorsiflexion with the knee extended.  Wound was irrigated.  Inverted simple sutures of 4-0 Monocryl were used to close the subcutaneous tissue.  The skin was closed with a running 3-0 Prolene.  Attention was then turned to the anterior ankle where the previously marked incision was made.  Sharp dissection was carried down through the skin and subcutaneous  tissue, taking care to protect superficial nerve crossing branches.  The extensor retinaculum was incised over the EHL and released proximally and distally.  The interval between the tibialis anterior and the EHL was then opened and the neurovascular bundle was identified.  It was mobilized and retracted laterally along with the EHL.  The anterior joint capsule was released sharply, medially, and laterally.  The talar neck was exposed to the level of the  talonavicular joint.  The sagittal saw was then used to resect the overhanging anterior osteophytes.  A stab incision was made over the tibial tubercle.  A 0.25-inch osteotome was placed in the medial gutter and used as a reference for inserting a 3.2-mm guide pin in the tibial tubercle.  The external tibial alignment guide was then placed on the guide pin.  AP and lateral fluoroscopic images were used to align the guide parallel to the long axis of the tibia.  The rotation of the cutting guide was then established using a T-handle distally and the 0.25-inch osteotome of the medial gutter.  The cutting guide was pinned into position distally.  The angel wing was placed in the distal cutting block and lateral x-ray was obtained.  The resection level was set with the angel wing and the block height was tightened.  The mortise view was then obtained and the medial-lateral position was set such to the medial edge of the block was aligned with the medial gutter.  The distal cutting guide was then pinned into position and protective pins were placed at the level of the cut.  The sagittal saw was then used to cut through the distal tibia from anterior to posterior.  A reciprocating saw was then used cut the medial gutter.  The guide pins were removed. The medial gutter cut was completed.  The distal cutting block was removed.  The sagittal saw was used to morselize the distal fragments. The distal tibial bone was resected using a rongeur to the level at which the distal tongue guide could be placed flush.  The lateral views showed appropriate position of the talus relative to the distal cutting guide.  This guide was then pinned into place.  The sagittal saw was used to resect the top of the talus.  This guide was placed after removing all the cartilage from the top of the talus.  The posterior fragments remaining from the tibial cut were then removed in their entirety.  The wound was irrigated  copiously at this point.  The talus was then sized as an extra small and the talar cutting guide was pinned into position.  The lateral x-ray was obtained confirming the appropriate position of the cutting guide.  Posterior and anterior chamfer cuts were made and the guide was removed.  The medial and lateral cutting guide was then placed into the same hole and then pinned into position.  The medial and lateral cuts were made with the reciprocating saw.  All the bony fragments were removed and the medial and lateral cuts were deepened.  After adjustments, the window trial with fit flush and it was pinned into position.  Lateral x-ray confirmed appropriate position of the window trial.  The central hole was then drilled and punched.  The wound was irrigated copiously at this point. A size extra small talar component was then impacted into position and on the lateral view was shown to be in the appropriate position.  The distal tibial cut surface was measured and a size large trial  component was placed.  It appeared to be slightly too large, so a size medium was placed and again a lateral x-ray was obtained this showed appropriate size of the tibial component.  The AP rather the mortise view showed appropriate alignment of the trial.  This was pinned into place.  The barrel holes were drilled and the fin cutter was used to cut fin holes. The component was removed and the wound was again irrigated copiously. The size medium tibial component was then placed in the holes, aligned with the guide pin, and this was advanced until it was well seated.  AP and lateral views showed appropriate position of the component.  A size 8-mm trial poly was placed in the ankle and this was noted to be appropriately positioned.  This was removed and the wound was again irrigated copiously.  The final 8-mm polyethylene spacer was placed in the ankle joint.  Final AP and lateral views showed appropriate  position and alignment of all hardware.  The anterior barrel holes were bone grafted.  The anterior joint capsule was repaired over the anterior joint using 0 Vicryl simple sutures.  The extensor retinaculum was repaired with simple sutures of zero Vicryl.  The subcutaneous tissue was approximated with inverted simple sutures of 3-0 and 4-0 Monocryl. The skin incision was closed with a running 3-0 Prolene suture. Horizontal mattress suture was used to close the proximal pin site. Sterile dressings were applied followed by a well-padded short-leg cast with the ankle held in neutral position.  The tourniquet had been released at 2 hours and 30 minutes after application of the dressings.  The patient was then awakened from anesthesia and transported to the recovery room in stable condition.  FOLLOWUP PLAN:  The patient will be nonweightbearing on the left lower extremity.  She will be observed overnight for pain control.  She will follow up with me in 3 weeks for suture removal and conversion to a short-leg cast and initiation of weightbearing.     Toni Arthurs, MD     JH/MEDQ  D:  12/03/2010  T:  12/03/2010  Job:  [id number]  Electronically Signed by Toni Arthurs  on 12/05/2010 03:19:00 PM

## 2010-12-06 LAB — TYPE AND SCREEN
ABO/RH(D): B POS
PT AG Type: NEGATIVE
Unit division: 0

## 2011-01-26 LAB — URINALYSIS, ROUTINE W REFLEX MICROSCOPIC
Bilirubin Urine: NEGATIVE
Glucose, UA: NEGATIVE
Hgb urine dipstick: NEGATIVE
Specific Gravity, Urine: 1.013
Urobilinogen, UA: 1

## 2011-01-26 LAB — POCT I-STAT, CHEM 8
BUN: 14
Creatinine, Ser: 1.1
Glucose, Bld: 89
Hemoglobin: 12.2
Potassium: 3.5

## 2011-02-04 LAB — BASIC METABOLIC PANEL
CO2: 30 mEq/L (ref 19–32)
Chloride: 100 mEq/L (ref 96–112)
GFR calc Af Amer: >60 mL/min (ref >60)
Glucose, Bld: 98 mg/dL (ref 70–99)
Potassium: 3.8 mEq/L (ref 3.5–5.1)
Sodium: 138 mEq/L (ref 135–145)

## 2011-03-04 ENCOUNTER — Encounter (HOSPITAL_COMMUNITY): Payer: Self-pay

## 2011-03-04 ENCOUNTER — Encounter (HOSPITAL_COMMUNITY): Payer: BC Managed Care – PPO

## 2011-03-04 LAB — BASIC METABOLIC PANEL
BUN: 8 mg/dL (ref 6–23)
CO2: 28 mEq/L (ref 19–32)
Calcium: 9.8 mg/dL (ref 8.4–10.5)
Chloride: 101 mEq/L (ref 96–112)
Creatinine, Ser: 0.75 mg/dL (ref 0.50–1.10)
Glucose, Bld: 85 mg/dL (ref 70–99)

## 2011-03-04 LAB — CBC
HCT: 36.4 % (ref 36.0–46.0)
Hemoglobin: 12.7 g/dL (ref 12.0–15.0)
MCH: 26.8 pg (ref 26.0–34.0)
MCV: 77 fL — ABNORMAL LOW (ref 78.0–100.0)
Platelets: 296 10*3/uL (ref 150–400)
RBC: 4.73 MIL/uL (ref 3.87–5.11)
WBC: 5.4 10*3/uL (ref 4.0–10.5)

## 2011-03-04 LAB — HCG, SERUM, QUALITATIVE: Preg, Serum: NEGATIVE

## 2011-03-04 NOTE — Patient Instructions (Signed)
20 [name]  03/04/2011   Your procedure is scheduled on:  03/09/11  4098-1191  Report to [facility] at 0830 AM.  Call this number if you have problems the morning of surgery: [phone]   Remember:   Do not eat food:After Midnight.  Do not drink clear liquids: After Midnight.  Take these medicines the morning of surgery with A SIP OF WATER: none    Do not wear jewelry, make-up or nail polish.  Do not wear lotions, powders, or perfumes. You may wear deodorant.  Do not shave 48 hours prior to surgery.  Do not bring valuables to the hospital.  Contacts, dentures or bridgework may not be worn into surgery.  Leave suitcase in the car. After surgery it may be brought to your room.  For patients admitted to the hospital, checkout time is 11:00 AM the day of discharge.   Patients discharged the day of surgery will not be allowed to drive home.  Name and phone number of your driver: son   Special Instructions: CHG Shower Use Special Wash: 1/2 bottle night before surgery and 1/2 bottle morning of surgery. shower chin to toes Wash face and private parts with regular soap    Please read over the following fact sheets that you were given: MRSA Information, coughing and deep breathing exercises reviewed, leg exercises reviewed, blood transfusion sheet

## 2011-03-06 LAB — TYPE AND SCREEN
ABO/RH(D): B POS
Antibody Screen: POSITIVE
DAT, IgG: NEGATIVE

## 2011-03-07 ENCOUNTER — Encounter: Payer: Self-pay | Admitting: Family Medicine

## 2011-03-07 ENCOUNTER — Ambulatory Visit (INDEPENDENT_AMBULATORY_CARE_PROVIDER_SITE_OTHER): Payer: BC Managed Care – PPO | Admitting: Family Medicine

## 2011-03-07 VITALS — BP 152/91 | HR 67 | Ht 68.0 in | Wt 220.0 lb

## 2011-03-07 DIAGNOSIS — Z23 Encounter for immunization: Secondary | ICD-10-CM

## 2011-03-07 DIAGNOSIS — I10 Essential (primary) hypertension: Secondary | ICD-10-CM

## 2011-03-07 DIAGNOSIS — N6089 Other benign mammary dysplasias of unspecified breast: Secondary | ICD-10-CM

## 2011-03-07 DIAGNOSIS — N6099 Unspecified benign mammary dysplasia of unspecified breast: Secondary | ICD-10-CM

## 2011-03-07 NOTE — Assessment & Plan Note (Signed)
Not well controlled but has been eating more salt since living with her son since she has separated.  Will be moving by herself soon and feels can lower her salt

## 2011-03-07 NOTE — Patient Instructions (Addendum)
Measure your blood pressure if once the surgery is over it is regularly > 140/90 then call us  I will call you if your tests are not good.  Otherwise I will send you a letter.  If you do not hear from me with in 2 weeks please call our office.     Cut back on salt and get back to regular exercise once the surgery is done  If the black spot on your side gets bigger or bleeds then come in

## 2011-03-07 NOTE — Progress Notes (Signed)
  Subjective:    Patient ID: [name], female    DOB: 1965/06/30, 45 y.o.   MRN: [id number]  HPI  CPE Feels well overall  HYPERTENSION Disease Monitoring Home BP Monitoring 150/90s recently Chest pain- no     Dyspnea-  no  Medications Compliance: taking as prescribed. Lightheadedness-  no  Edema-  no     Review of Systems    Patient reports no  vision/ hearing changes,anorexia, weight change, fever ,adenopathy, persistant / recurrent hoarseness, swallowing issues, chest pain, edema,persistant / recurrent cough, hemoptysis, dyspnea(rest, exertional, paroxysmal nocturnal), gastrointestinal  bleeding (melena, rectal bleeding), abdominal pain, excessive heart burn, GU symptoms(dysuria, hematuria, pyuria, voiding/incontinence  Issues) syncope, focal weakness, severe memory loss, concerning skin lesions, depression, anxiety, abnormal bruising/bleeding, major joint swelling, breast masses or abnormal vaginal bleeding.    Objective:   Physical Exam  Heart - Regular rate and rhythm.  No murmurs, gallops or rubs.    Lungs:  Normal respiratory effort, chest expands symmetrically. Lungs are clear to auscultation, no crackles or wheezes. Extremities:  No cyanosis, edema, or deformity noted with good range of motion of all major joints.   Except Left ankle with decreased range of motion Skin:  Intact without suspicious lesions or rashes except R lower abdomen has a 5 mm round black crusty lesion  Neck:  No deformities, thyromegaly, masses, or tenderness noted.   Supple with full range of motion without pain. Abdomen: soft and non-tender without masses, organomegaly or hernias noted.  No guarding or rebound        Assessment & Plan:    Healthy except for elevated blood pressure.  See AVS for instructions

## 2011-03-08 ENCOUNTER — Other Ambulatory Visit: Payer: Self-pay | Admitting: Family Medicine

## 2011-03-08 DIAGNOSIS — N63 Unspecified lump in unspecified breast: Secondary | ICD-10-CM

## 2011-03-09 ENCOUNTER — Encounter (HOSPITAL_COMMUNITY): Payer: Self-pay | Admitting: Anesthesiology

## 2011-03-09 ENCOUNTER — Encounter (HOSPITAL_COMMUNITY): Payer: Self-pay | Admitting: *Deleted

## 2011-03-09 ENCOUNTER — Encounter (HOSPITAL_COMMUNITY)
Admission: AD | Disposition: A | Discharge status: Home / Self Care | Payer: Self-pay | Source: Ambulatory Visit | Attending: Urology

## 2011-03-09 ENCOUNTER — Ambulatory Visit (HOSPITAL_COMMUNITY): Payer: BC Managed Care – PPO | Admitting: Anesthesiology

## 2011-03-09 ENCOUNTER — Other Ambulatory Visit: Payer: Self-pay | Admitting: Urology

## 2011-03-09 ENCOUNTER — Observation Stay (HOSPITAL_COMMUNITY)
Admission: AD | Admit: 2011-03-09 | Discharge: 2011-03-11 | Disposition: A | Discharge status: Home / Self Care | Payer: BC Managed Care – PPO | Source: Ambulatory Visit | Attending: Urology | Admitting: Urology

## 2011-03-09 DIAGNOSIS — Z7982 Long term (current) use of aspirin: Secondary | ICD-10-CM | POA: Insufficient documentation

## 2011-03-09 DIAGNOSIS — Z01812 Encounter for preprocedural laboratory examination: Secondary | ICD-10-CM | POA: Insufficient documentation

## 2011-03-09 DIAGNOSIS — I10 Essential (primary) hypertension: Secondary | ICD-10-CM | POA: Insufficient documentation

## 2011-03-09 DIAGNOSIS — K219 Gastro-esophageal reflux disease without esophagitis: Secondary | ICD-10-CM | POA: Insufficient documentation

## 2011-03-09 DIAGNOSIS — Z79899 Other long term (current) drug therapy: Secondary | ICD-10-CM | POA: Insufficient documentation

## 2011-03-09 DIAGNOSIS — N281 Cyst of kidney, acquired: Principal | ICD-10-CM | POA: Insufficient documentation

## 2011-03-09 HISTORY — PX: LAPAROSCOPIC NEPHRECTOMY: SHX1930

## 2011-03-09 SURGERY — NEPHRECTOMY, RADICAL, LAPAROSCOPIC, ADULT
Anesthesia: General | Site: Flank | Laterality: Right | Wound class: Clean

## 2011-03-09 MED ORDER — DEXAMETHASONE SODIUM PHOSPHATE 4 MG/ML IJ SOLN
INTRAMUSCULAR | Status: DC | PRN
  Administered 2011-03-09: 5 mg via INTRAVENOUS

## 2011-03-09 MED ORDER — ACETAMINOPHEN 10 MG/ML IV SOLN
INTRAVENOUS | Status: DC | PRN
  Administered 2011-03-09: 1000 mg via INTRAVENOUS

## 2011-03-09 MED ORDER — CEFAZOLIN SODIUM 1-5 GM-% IV SOLN: 2.0000 g | Freq: Once | INTRAVENOUS | Status: DC

## 2011-03-09 MED ORDER — DEXTROSE-NACL 5-0.45 % IV SOLN
INTRAVENOUS | Status: DC
Start: 2011-03-09 — End: 2011-03-11
  Administered 2011-03-09 – 2011-03-11 (×2): via INTRAVENOUS

## 2011-03-09 MED ORDER — BUPIVACAINE HCL (PF) 0.5 % IJ SOLN
INTRAMUSCULAR | Status: AC
  Filled 2011-03-09: qty 30

## 2011-03-09 MED ORDER — ROCURONIUM BROMIDE 100 MG/10ML IV SOLN
INTRAVENOUS | Status: DC | PRN
  Administered 2011-03-09: 35 mg via INTRAVENOUS
  Administered 2011-03-09: 10 mg via INTRAVENOUS

## 2011-03-09 MED ORDER — SPIRONOLACTONE 25 MG PO TABS
25.0000 mg | ORAL_TABLET | Freq: Every day | ORAL | Status: DC
  Administered 2011-03-09 – 2011-03-10 (×2): 25 mg via ORAL
  Filled 2011-03-09 (×3): qty 1

## 2011-03-09 MED ORDER — FENTANYL CITRATE 0.05 MG/ML IJ SOLN
INTRAMUSCULAR | Status: DC | PRN
  Administered 2011-03-09 (×3): 50 ug via INTRAVENOUS
  Administered 2011-03-09: 100 ug via INTRAVENOUS

## 2011-03-09 MED ORDER — ZOLPIDEM TARTRATE 5 MG PO TABS: 5.0000 mg | ORAL_TABLET | Freq: Every evening | ORAL | Status: DC | PRN

## 2011-03-09 MED ORDER — MIDAZOLAM HCL 5 MG/5ML IJ SOLN
INTRAMUSCULAR | Status: DC | PRN
  Administered 2011-03-09 (×2): 1 mg via INTRAVENOUS

## 2011-03-09 MED ORDER — LOSARTAN POTASSIUM 50 MG PO TABS
50.0000 mg | ORAL_TABLET | Freq: Every day | ORAL | Status: DC
  Administered 2011-03-09 – 2011-03-10 (×2): 50 mg via ORAL
  Filled 2011-03-09 (×3): qty 1

## 2011-03-09 MED ORDER — CEFAZOLIN SODIUM 1-5 GM-% IV SOLN
1.0000 g | Freq: Three times a day (TID) | INTRAVENOUS | Status: DC
  Administered 2011-03-09 – 2011-03-11 (×5): 1 g via INTRAVENOUS
  Filled 2011-03-09 (×7): qty 50

## 2011-03-09 MED ORDER — DEXTROSE 5 % IV SOLN: 1.0000 g | INTRAVENOUS | Status: DC

## 2011-03-09 MED ORDER — ONDANSETRON HCL 4 MG/2ML IJ SOLN
4.0000 mg | INTRAMUSCULAR | Status: DC | PRN
  Administered 2011-03-09: 17:00:00 via INTRAVENOUS
  Filled 2011-03-09: qty 2

## 2011-03-09 MED ORDER — HEMOSTATIC AGENTS (NO CHARGE) OPTIME
TOPICAL | Status: DC | PRN
  Administered 2011-03-09: 1

## 2011-03-09 MED ORDER — INDIGOTINDISULFONATE SODIUM 8 MG/ML IJ SOLN
INTRAMUSCULAR | Status: DC | PRN
  Administered 2011-03-09: 5 mL via INTRAVENOUS

## 2011-03-09 MED ORDER — PROPOFOL 10 MG/ML IV EMUL
INTRAVENOUS | Status: DC | PRN
  Administered 2011-03-09: 180 mg via INTRAVENOUS

## 2011-03-09 MED ORDER — BUPIVACAINE HCL (PF) 0.5 % IJ SOLN
INTRAMUSCULAR | Status: DC | PRN
  Administered 2011-03-09: 26 mL

## 2011-03-09 MED ORDER — ONDANSETRON HCL 4 MG/2ML IJ SOLN
INTRAMUSCULAR | Status: DC | PRN
  Administered 2011-03-09: 4 mg via INTRAVENOUS

## 2011-03-09 MED ORDER — HYDROMORPHONE HCL PF 1 MG/ML IJ SOLN
INTRAMUSCULAR | Status: AC
  Administered 2011-03-09: 1 mg via INTRAVENOUS
  Filled 2011-03-09: qty 1

## 2011-03-09 MED ORDER — ACETAMINOPHEN 10 MG/ML IV SOLN
INTRAVENOUS | Status: AC
  Filled 2011-03-09: qty 100

## 2011-03-09 MED ORDER — SUCCINYLCHOLINE CHLORIDE 20 MG/ML IJ SOLN
INTRAMUSCULAR | Status: DC | PRN
  Administered 2011-03-09: 160 mg via INTRAVENOUS

## 2011-03-09 MED ORDER — ACETAMINOPHEN 10 MG/ML IV SOLN
1000.0000 mg | Freq: Four times a day (QID) | INTRAVENOUS | Status: AC
  Administered 2011-03-09 – 2011-03-10 (×4): 1000 mg via INTRAVENOUS
  Filled 2011-03-09 (×5): qty 100

## 2011-03-09 MED ORDER — DROPERIDOL 2.5 MG/ML IJ SOLN
0.6250 mg | INTRAMUSCULAR | Status: DC | PRN
  Filled 2011-03-09: qty 0.25

## 2011-03-09 MED ORDER — LACTATED RINGERS IV SOLN
INTRAVENOUS | Status: DC | PRN
  Administered 2011-03-09 (×3): via INTRAVENOUS

## 2011-03-09 MED ORDER — LIDOCAINE HCL (CARDIAC) 20 MG/ML IV SOLN
INTRAVENOUS | Status: DC | PRN
  Administered 2011-03-09: 100 mg via INTRAVENOUS

## 2011-03-09 MED ORDER — GLYCOPYRROLATE 0.2 MG/ML IJ SOLN
INTRAMUSCULAR | Status: DC | PRN
  Administered 2011-03-09: .6 mg via INTRAVENOUS

## 2011-03-09 MED ORDER — NEOSTIGMINE METHYLSULFATE 1 MG/ML IJ SOLN
INTRAMUSCULAR | Status: DC | PRN
  Administered 2011-03-09: 4 mg via INTRAVENOUS

## 2011-03-09 MED ORDER — HYDROMORPHONE HCL PF 1 MG/ML IJ SOLN
0.5000 mg | INTRAMUSCULAR | Status: DC | PRN
  Filled 2011-03-09: qty 1

## 2011-03-09 MED ORDER — BUPIVACAINE-EPINEPHRINE 0.25% -1:200000 IJ SOLN
INTRAMUSCULAR | Status: AC
  Filled 2011-03-09: qty 1

## 2011-03-09 MED ORDER — CEFAZOLIN SODIUM 1-5 GM-% IV SOLN
INTRAVENOUS | Status: AC
  Filled 2011-03-09: qty 50

## 2011-03-09 MED ORDER — HYDROMORPHONE HCL PF 1 MG/ML IJ SOLN
0.2500 mg | INTRAMUSCULAR | Status: DC | PRN
  Administered 2011-03-09 (×4): 0.5 mg via INTRAVENOUS

## 2011-03-09 MED ORDER — CEFAZOLIN SODIUM 1-5 GM-% IV SOLN
2.0000 g | Freq: Once | INTRAVENOUS | Status: AC
  Administered 2011-03-09: 1 g via INTRAVENOUS

## 2011-03-09 MED ORDER — HYDROMORPHONE HCL PF 1 MG/ML IJ SOLN
INTRAMUSCULAR | Status: AC
  Filled 2011-03-09: qty 1

## 2011-03-09 MED ORDER — ONDANSETRON HCL 4 MG/2ML IJ SOLN
INTRAMUSCULAR | Status: AC
  Filled 2011-03-09: qty 2

## 2011-03-09 SURGICAL SUPPLY — 86 items
ADH SKN CLS APL DERMABOND .7 (GAUZE/BANDAGES/DRESSINGS) ×1
APL SKNCLS STERI-STRIP NONHPOA (GAUZE/BANDAGES/DRESSINGS)
APL SRG 32X5 SNPLK LF DISP (MISCELLANEOUS)
BAG SPEC RTRVL LRG 6X4 10 (ENDOMECHANICALS) ×1
BAG SPEC THK2 15X12 ZIP CLS (MISCELLANEOUS) ×1
BAG ZIPLOCK 12X15 (MISCELLANEOUS) ×2 IMPLANT
BENZOIN TINCTURE PRP APPL 2/3 (GAUZE/BANDAGES/DRESSINGS) IMPLANT
BLADE EXTENDED COATED 6.5IN (ELECTRODE) IMPLANT
BLADE SURG SZ10 CARB STEEL (BLADE) ×2 IMPLANT
CANISTER SUCTION 2500CC (MISCELLANEOUS) ×2 IMPLANT
CHLORAPREP W/TINT 26ML (MISCELLANEOUS) ×2 IMPLANT
CLIP LIGATING HEM O LOK PURPLE (MISCELLANEOUS) ×5 IMPLANT
CLIP LIGATING HEMO LOK XL GOLD (MISCELLANEOUS) ×3 IMPLANT
CLIP LIGATING HEMO O LOK GREEN (MISCELLANEOUS) ×5 IMPLANT
CLIP SUT LAPRA TY ABSORB (SUTURE) IMPLANT
CLOTH BEACON ORANGE TIMEOUT ST (SAFETY) ×2 IMPLANT
COVER SURGICAL LIGHT HANDLE (MISCELLANEOUS) ×2 IMPLANT
CUTTER FLEX LINEAR 45M (STAPLE) IMPLANT
DERMABOND ADVANCED (GAUZE/BANDAGES/DRESSINGS) ×1
DERMABOND ADVANCED .7 DNX12 (GAUZE/BANDAGES/DRESSINGS) ×1 IMPLANT
DRAIN CHANNEL 10F 3/8 F FF (DRAIN) IMPLANT
DRAIN CHANNEL 15F RND FF 3/16 (WOUND CARE) ×2 IMPLANT
DRAPE CAMERA CLOSED 9X96 (DRAPES) ×2 IMPLANT
DRAPE CAMERA COVER (DRAPES) ×2 IMPLANT
DRAPE INCISE IOBAN 66X45 STRL (DRAPES) ×2 IMPLANT
DRAPE LAPAROSCOPIC ABDOMINAL (DRAPES) ×2 IMPLANT
DRAPE WARM FLUID 44X44 (DRAPE) ×2 IMPLANT
ELECT REM PT RETURN 9FT ADLT (ELECTROSURGICAL) ×2
ELECTRODE REM PT RTRN 9FT ADLT (ELECTROSURGICAL) ×1 IMPLANT
EVACUATOR DRAINAGE 7X20 100CC (MISCELLANEOUS) IMPLANT
EVACUATOR SILICONE 100CC (DRAIN) IMPLANT
EVACUATOR SILICONE 100CC (MISCELLANEOUS)
FLOSEAL (HEMOSTASIS) IMPLANT
FLOSEAL 10ML (HEMOSTASIS) ×1 IMPLANT
GLOVE BIOGEL M 8.0 STRL (GLOVE) ×2 IMPLANT
GOWN STRL REIN XL XLG (GOWN DISPOSABLE) ×2 IMPLANT
HEMOSTAT SURGICEL 4X8 (HEMOSTASIS) IMPLANT
KIT BASIN OR (CUSTOM PROCEDURE TRAY) ×2 IMPLANT
LOOP VESSEL MAXI BLUE (MISCELLANEOUS) ×1 IMPLANT
MARKER SKIN DUAL TIP RULER LAB (MISCELLANEOUS) ×1 IMPLANT
NEEDLE HYPO 22GX1.5 SAFETY (NEEDLE) ×2 IMPLANT
PENCIL BUTTON HOLSTER BLD 10FT (ELECTRODE) ×2 IMPLANT
POSITIONER SURGICAL ARM (MISCELLANEOUS) IMPLANT
POUCH ENDO CATCH II 15MM (MISCELLANEOUS) IMPLANT
POUCH SPECIMEN RETRIEVAL 10MM (ENDOMECHANICALS) ×2 IMPLANT
RELOAD 45 VASCULAR/THIN (ENDOMECHANICALS) IMPLANT
SCALPEL HARMONIC ACE (MISCELLANEOUS) ×2 IMPLANT
SCISSORS LAP 5X35 DISP (ENDOMECHANICALS) ×1 IMPLANT
SEALANT SURGICAL APPL DUAL CAN (MISCELLANEOUS) ×1 IMPLANT
SET IRRIG TUBING LAPAROSCOPIC (IRRIGATION / IRRIGATOR) ×2 IMPLANT
SOLUTION ANTI FOG 6CC (MISCELLANEOUS) ×2 IMPLANT
SPONGE GAUZE 4X4 12PLY (GAUZE/BANDAGES/DRESSINGS) ×1 IMPLANT
SPONGE LAP 18X18 X RAY DECT (DISPOSABLE) IMPLANT
SPONGE LAP 4X18 X RAY DECT (DISPOSABLE) IMPLANT
SPONGE SURGIFOAM ABS GEL 100 (HEMOSTASIS) IMPLANT
STAPLER VISISTAT 35W (STAPLE) ×2 IMPLANT
STRIP CLOSURE SKIN 1/4X4 (GAUZE/BANDAGES/DRESSINGS) ×1 IMPLANT
SUT CHROMIC 3 0 SH 27 (SUTURE) ×2 IMPLANT
SUT ETHILON 3 0 PS 1 (SUTURE) ×1 IMPLANT
SUT PDS AB 1 CTX 36 (SUTURE) ×2 IMPLANT
SUT VIC AB 2-0 CT1 27 (SUTURE)
SUT VIC AB 2-0 CT1 27XBRD (SUTURE) IMPLANT
SUT VIC AB 2-0 SH 27 (SUTURE)
SUT VIC AB 2-0 SH 27X BRD (SUTURE) ×1 IMPLANT
SUT VIC AB 3-0 SH 27 (SUTURE)
SUT VIC AB 3-0 SH 27XBRD (SUTURE) ×1 IMPLANT
SUT VIC AB 3-0 SH 8-18 (SUTURE) ×2 IMPLANT
SUT VIC AB 4-0 RB1 27 (SUTURE)
SUT VIC AB 4-0 RB1 27XBRD (SUTURE) IMPLANT
SUT VICRYL 0 UR6 27IN ABS (SUTURE) ×5 IMPLANT
SUT VICRYL RAPIDE 4/0 PS 2 (SUTURE) ×2 IMPLANT
SYR BULB IRRIGATION 50ML (SYRINGE) ×1 IMPLANT
SYR CONTROL 10ML LL (SYRINGE) ×2 IMPLANT
SYS LAPSCP GELPORT 120MM (MISCELLANEOUS)
SYSTEM LAPSCP GELPORT 120MM (MISCELLANEOUS) ×1 IMPLANT
TAPE CLOTH SURG 4X10 WHT LF (GAUZE/BANDAGES/DRESSINGS) ×2 IMPLANT
TAPE STRIPS DRAPE STRL (GAUZE/BANDAGES/DRESSINGS) ×2 IMPLANT
TOWEL OR 17X26 10 PK STRL BLUE (TOWEL DISPOSABLE) ×2 IMPLANT
TRAY FOLEY CATH 14FRSI W/METER (CATHETERS) ×2 IMPLANT
TRAY LAP CHOLE (CUSTOM PROCEDURE TRAY) ×2 IMPLANT
TROCAR BLADELESS OPT 5 75 (ENDOMECHANICALS) ×4 IMPLANT
TROCAR XCEL 12X100 BLDLESS (ENDOMECHANICALS) ×2 IMPLANT
TROCAR XCEL BLUNT TIP 100MML (ENDOMECHANICALS) ×2 IMPLANT
TUBING FILTER THERMOFLATOR (ELECTROSURGICAL) ×1 IMPLANT
TUBING INSUFFLATION 10FT LAP (TUBING) ×2 IMPLANT
YANKAUER SUCT BULB TIP 10FT TU (MISCELLANEOUS) ×2 IMPLANT

## 2011-03-09 NOTE — H&P (Signed)
[name] is an 45 y.o. female. This woman presents for lap. Marsupialization of a recurrent right renal cyst. She has had prior cyst aspiration in interventional radiology. She recently presented with recurrent pain and was found to have a large recurrent cyst. She was offered repeat aspiration or lap. marsupialization and has chosen the latter.    Pertinent Gynecological History: Menses: N/A Bleeding:N/A Contraception: none DES exposure: unknown Blood transfusions: none Sexually transmitted diseases: no past history Previous GYN Procedures: N/A  Last mammogram: normal Date: ? Last pap: normal Date: N/A OB History: G?, P?   Menstrual History: Menarche age: N/A No LMP recorded. Patient is not currently having periods (Reason: Other).    Past Medical History  Diagnosis Date  . Allergy   . Hypertension   . PONV (postoperative nausea and vomiting)     N/V and trouble urinating after surgery   . Chronic kidney disease     right renal cyst   . GERD (gastroesophageal reflux disease)     hx of no problems at present   . Headache     occasional headache   . Arthritis     Lt ankle    Past Surgical History  Procedure Date  . Breast surgery 2010    lumpectomy Lt breast  . Tubal ligation 1988  . Fracture surgery 2011    Lt ankle surgeries x 3   . Joint replacement     left ankle 8/12   . Other surgical history     2 hand surgeries on left and shoulder surgery on right shoulder   . Endometrial ablation October 18 2010    at [facility] History  Problem Relation Age of Onset  . Diabetes Mother   . Heart disease Mother   . Hypertension Father   . Diabetes Sister   . Kidney disease Sister   . Hypertension Brother   . Kidney disease Brother   . Arthritis Brother   . Cancer Maternal Grandmother   . Diabetes Sister   . Hypertension Sister   . Hypertension Sister   . Hypertension Sister   . Hypertension Brother   . Mental illness Brother     Social History:   reports that she has never smoked. She does not have any smokeless tobacco history on file. She reports that she does not drink alcohol or use illicit drugs.  Allergies:  Allergies  Allergen Reactions  . Tetracycline Hcl Shortness Of Breath, Swelling and Other (See Comments)    Large purple bruise  . Septra (Bactrim) Itching, Swelling and Other (See Comments)    Purple bruise  . Sulfamethoxazole W/Trimethoprim Itching, Swelling and Other (See Comments)    Purple bruise    Prescriptions prior to admission  Medication Sig Dispense Refill  . ibuprofen (ADVIL,MOTRIN) 800 MG tablet Take 800 mg by mouth 2 (two) times daily as needed. For pain      . aspirin 81 MG tablet Take 81 mg by mouth daily.       Marland Kitchen losartan (COZAAR) 50 MG tablet Take 50 mg by mouth daily.       Marland Kitchen spironolactone (ALDACTONE) 25 MG tablet Take 25 mg by mouth daily.       . vitamin E 400 UNIT capsule Take 400 Units by mouth daily.       Marland Kitchen zolpidem (AMBIEN) 10 MG tablet Take 5 mg by mouth at bedtime as needed.         Review of Systems  Constitutional: Negative.   HENT: Negative.   Eyes: Negative.   Respiratory: Negative.   Cardiovascular: Negative.   Gastrointestinal: Positive for abdominal pain.  Genitourinary: Negative.  Negative for hematuria.  Musculoskeletal: Negative.   Skin: Negative.   Neurological: Negative.   Endo/Heme/Allergies: Negative.   Psychiatric/Behavioral: Negative.     Blood pressure 151/64, pulse 58, temperature 98.6 F (37 C), SpO2 98.00%. Physical Exam  Constitutional: She is oriented to person, place, and time. She appears well-developed and well-nourished.  HENT:  Head: Normocephalic and atraumatic.  Neck: Normal range of motion. Neck supple.  Cardiovascular: Normal rate and regular rhythm.   Respiratory: Effort normal and breath sounds normal.  GI: Soft. Bowel sounds are normal. There is tenderness.       Right CVAT  Genitourinary: Pelvic exam was performed with patient in the  knee-chest position.  Musculoskeletal: Normal range of motion.  Neurological: She is alert and oriented to person, place, and time.  Skin: Skin is warm and dry.    Results for orders placed during the hospital encounter of 03/09/11 (from the past 24 hour(s))  TYPE AND SCREEN     Status: Normal (Preliminary result)   Collection Time   03/09/11  7:25 AM      Component Value Range   ABO/RH(D) B POS     Antibody Screen PENDING     Sample Expiration 03/12/2011    PREPARE RBC (CROSSMATCH)     Status: Normal   Collection Time   03/09/11  7:30 AM      Component Value Range   Order Confirmation ORDER PROCESSED BY BLOOD BANK      No results found.  Assessment/Plan: 45 year old female with recurrent right renal cyst. She presents for lap. Assisted right renal cyst marsupialization.  Marcine Matar M 03/09/2011, 8:21 AM

## 2011-03-09 NOTE — H&P (Signed)
Date of Initial H&P: [id number]  History reviewed, patient examined, no change in status, stable for surgery.

## 2011-03-09 NOTE — Anesthesia Preprocedure Evaluation (Addendum)
Anesthesia Evaluation  Patient identified by MRN, date of birth, ID band Patient awake    Reviewed: Allergy & Precautions, H&P , NPO status , Patient's Chart, lab work & pertinent test results  History of Anesthesia Complications (+) PONV  Airway Mallampati: I TM Distance: >3 FB Neck ROM: Full    Dental No notable dental hx.    Pulmonary neg pulmonary ROS,  clear to auscultation  Pulmonary exam normal       Cardiovascular hypertension, Pt. on medications neg cardio ROS Regular Normal    Neuro/Psych Negative Neurological ROS  Negative Psych ROS   GI/Hepatic negative GI ROS, Neg liver ROS, GERD-  Poorly Controlled,  Endo/Other  Negative Endocrine ROS  Renal/GU negative Renal ROS  Genitourinary negative   Musculoskeletal negative musculoskeletal ROS (+)   Abdominal   Peds negative pediatric ROS (+)  Hematology negative hematology ROS (+)   Anesthesia Other Findings   Reproductive/Obstetrics negative OB ROS                          Anesthesia Physical Anesthesia Plan  ASA: II  Anesthesia Plan: General   Post-op Pain Management:    Induction: Intravenous and Rapid sequence  Airway Management Planned: Oral ETT  Additional Equipment:   Intra-op Plan:   Post-operative Plan: Extubation in OR  Informed Consent: I have reviewed the patients History and Physical, chart, labs and discussed the procedure including the risks, benefits and alternatives for the proposed anesthesia with the patient or authorized representative who has indicated his/her understanding and acceptance.   Dental advisory given  Plan Discussed with: CRNA  Anesthesia Plan Comments:        Anesthesia Quick Evaluation

## 2011-03-09 NOTE — Transfer of Care (Signed)
Immediate Anesthesia Transfer of Care Note  Patient: [name]  Procedure(s) Performed:  LAPAROSCOPIC NEPHRECTOMY - Laparoscopic Assisted Removal Of Renal Cyst   Patient Location: PACU  Anesthesia Type: General  Level of Consciousness: awake and alert   Airway & Oxygen Therapy: Patient Spontanous Breathing and Patient connected to face mask oxygen  Post-op Assessment: Report given to PACU RN  Post vital signs: Reviewed and stable  Complications: No apparent anesthesia complications

## 2011-03-09 NOTE — Op Note (Signed)
NAMEJAMICIA, HAALAND               ACCOUNT NO.:  [id number]  MEDICAL RECORD NO.:  [id number]  LOCATION:  [facility]                         FACILITY:  [facility]  PHYSICIAN:  Bertram Millard. Tabitha Riggins, M.D.DATE OF BIRTH:  November 27, 1965  DATE OF PROCEDURE:  03/09/2011 DATE OF DISCHARGE:                              OPERATIVE REPORT   PREOPERATIVE DIAGNOSIS:  Large symptomatic right renal cyst, Bosniak category 59F.  POSTOPERATIVE DIAGNOSIS:  Large symptomatic right renal cyst, Bosniak category 59F.  PRINCIPAL PROCEDURE:  Laparoscopic-assisted marsupialization/excision of right renal cyst.  SURGEON:  Bertram Millard. Chatham Howington, M.D.  FIRST ASSISTANT:  Pecola Leisure, PA  ANESTHESIA:  General endotracheal.  SPECIMENS:  Right renal cyst wall.  DRAIN:  10-mm Blake drain in right cyst.  COMPLICATIONS:  None.  BRIEF HISTORY:  Ronetta is a 45 year old female whom I have been seeing for quite sometime.  She has a symptomatic right renal cyst.  In the past, this was treated with cyst aspiration, as it was painful.  She has had recurrence of this cyst.  Previously, cyst fluid cytologies have been negative.  Because of radiographic documentation of recurrence of this cyst, with it being approximately 10-cm in size and located anterior to the kidney, the patient requests repeated treatment of the cyst.  I offered her Interventional Radiology consultation for cyst aspiration under imaging guidance.  She would not like to go through that procedure again, and I also offered her laparoscopic-assisted excision of the cyst.  She would prefer to go with that procedure. Risks and complications of the procedure have been discussed with the patient.  These include, but are not limited to injury to the kidney, bleeding, need for transfusion, injury to surrounding organs, DVT, PE, abdominal wound and lung infections among others.  She understands these.  She also understands the "goals" of the procedures to resect  the cyst and prevent recurrence.  DESCRIPTION OF PROCEDURE:  The patient was identified in the holding area.  The surgical side was correctly marked.  She received preoperative IV antibiotics.  She was then taken to the operating room where general endotracheal anesthetic was administered.  Foley catheter was placed transurethrally into the bladder.  She was then placed on the bean bag, and she was placed in the right side up position.  The table was flexed.  Axillary roll was placed.  All extremities were padded appropriately.  Her abdomen was then prepped and draped.  Time-out was then performed.  The procedure then commenced.  A 15-mm incision was made just inferior to the umbilicus in the midline and carried down to fascia with electrocautery.  The fascia was divided in the midline and access to the peritoneum was obtained.  Two stay sutures of Vicryl were placed in the rectus fascia and the Hasson cannula was placed.  Pneumoperitoneum was established.  Two other 5-mm trocars were placed, 1 in the upper abdomen just to the right of the midline.  This was placed under direct vision after anesthesia was provided with 0.5% plain Marcaine.  The other 5-mm port was placed lateral and superior to the umbilicus in the anterior axillary line.  This again was placed under direct vision with Marcaine used for  anesthesia.  Inspection of the abdomen was then briefly performed and this was found to be normal.  The cyst was identified, it was right underneath the liver edge.  The colon was adhesed to the lower edge of the liver.  This was carefully dissected with Harmonic scalpel. Once the colon was immobilized, the cyst was quite easily identified.  I then, using the Harmonic scalpel, totally circumferentially dissected the cyst down to its margin with the parenchyma.  I then used a gallbladder trocar to puncture the cyst and drain all the contents.  Once the contents were drained and some of  the fluid was sent for cytology, the cyst was then dissected down to its parenchymal margin.  I then excised the cyst along its periphery.  There were very tiny bleeders that were carefully electrocoagulated.  After the cyst was excised, it was moved laterally.  I then inspected the base of the cyst. Indigo carmine had been given at the beginning of the procedure intravenously.  I saw no blue dye  within the base of the cyst.  There was one small 2-cm cyst located inferiorly and laterally at the base of the cyst.  This was opened and marsupialized.  No specimen was sent from that.  Inspection peripherally revealed adequate hemostasis.  I did use 10 cc of FloSeal to provide hemostasis on the upper/superior border of the cyst underneath the liver.  Once this was placed, I then placed a 10- [place] round Blake drain through the right-sided trocar.  This was brought into the abdomen and I placed this on the excise of this cavity. This was then sutured to the skin with a silk suture.  The cyst was then placed in an EndoCatch bag and then extracted.  I then reinspected the area of dissection and no bleeding was seen.  At this point, the upper midline trocar was removed under direct vision, the right-sided trocar site was inspected and found to be hemostatic.  The Hasson cannula was then removed and the pneumoperitoneum released.  The midline incision was then closed with a running 0 Vicryl.  I then used 4-0 Monocryl to provide wound apposition in the midline incisions.  Dermabond was then placed over top of these.  The patient tolerated the procedure well.  Estimated blood loss was perhaps 25 cc.  The specimen was sent to Pathology-the cyst as well as cyst fluid.  She was then extubated and taken to PACU in stable condition.  Sponge, needle, and instrument counts were correct x2.     Bertram Millard. Jewell Ryans, M.D.     SMD/MEDQ  D:  03/09/2011  T:  03/09/2011  Job:  [id number]

## 2011-03-09 NOTE — Anesthesia Postprocedure Evaluation (Signed)
  Anesthesia Post-op Note  Patient: [name]  Procedure(s) Performed:  LAPAROSCOPIC NEPHRECTOMY - Laparoscopic Assisted Removal Of Renal Cyst   Patient Location: PACU  Anesthesia Type: General  Level of Consciousness: awake and alert   Airway and Oxygen Therapy: Patient Spontanous Breathing  Post-op Pain: mild  Post-op Assessment: Post-op Vital signs reviewed, Patient's Cardiovascular Status Stable, Respiratory Function Stable, Patent Airway and No signs of Nausea or vomiting  Post-op Vital Signs: stable  Complications: No apparent anesthesia complications

## 2011-03-09 NOTE — Op Note (Signed)
Dictation # [id number]

## 2011-03-09 NOTE — Progress Notes (Signed)
Day of Surgery Subjective: Patient reports nausea.  Objective: Vital signs in last 24 hours: Temp:  [97.5 F (36.4 C)-98.6 F (37 C)] 98.1 F (36.7 C) (11/07 1545) Pulse Rate:  [44-58] 54  (11/07 1545) Resp:  [13-21] 18  (11/07 1545) BP: (125-169)/(57-86) 147/86 mmHg (11/07 1545) SpO2:  [98 %-100 %] 98 % (11/07 1545) Weight:  [99.5 kg (219 lb 5.7 oz)] 219 lb 5.7 oz (99.5 kg) (11/07 1545)  Intake/Output from previous day:   Intake/Output this shift: Total I/O In: 3850 [I.V.:3700; IV Piggyback:150] Out: 1228 [Urine:1200; Drains:3; Blood:25]  Physical Exam:  General:alert GI:  dressings dry  Resp: clear to auscultation bilaterally  Lab Results: No results found for this basename: HGB:3,HCT:3 in the last 72 hours BMET No results found for this basename: NA:2,K:2,CL:2,CO2:2,GLUCOSE:2,BUN:2,CREATININE:2,CALCIUM:2 in the last 72 hours No results found for this basename: LABPT:3,INR:3 in the last 72 hours No results found for this basename: LABURIN:1 in the last 72 hours Results for orders placed in visit on 03/04/11  SURGICAL PCR SCREEN     Status: Normal   Collection Time   03/04/11  1:35 PM      Component Value Range Status Comment   MRSA, PCR NEGATIVE  NEGATIVE  Final    Staphylococcus aureus NEGATIVE  NEGATIVE  Final     Studies/Results: No results found.  Assessment/Plan: S/p lap assisted cyst excision, doing well. Continue postop plan per orders     LOS: 0 days   Marcine Matar M 03/09/2011, 6:15 PM

## 2011-03-09 NOTE — Progress Notes (Signed)
ANTIBIOTIC CONSULT NOTE - INITIAL  Pharmacy Consult for Ancef   Indication: post-op prophylaxis s/p renal cyst removal  Allergies  Allergen Reactions  . Tetracycline Hcl Shortness Of Breath, Swelling and Other (See Comments)    Large purple bruise  . Septra (Bactrim) Itching, Swelling and Other (See Comments)    Purple bruise  . Sulfamethoxazole W/Trimethoprim Itching, Swelling and Other (See Comments)    Purple bruise    Patient Measurements: Height: 5\' 8"  (172.7 cm) Weight: 219 lb 5.7 oz (99.5 kg) IBW/kg (Calculated) : 63.9     Vital Signs: Temp: 98.1 F (36.7 C) (11/07 1545) Temp src: Oral (11/07 1545) BP: 147/86 mmHg (11/07 1545) Pulse Rate: 54  (11/07 1545) Intake/Output from previous day:   Intake/Output from this shift: Total I/O In: 3400 [I.V.:3400] Out: 1225 [Urine:1200; Blood:25]  Labs: No results found for this basename: WBC:3,HGB:3,PLT:3,LABCREA:3,CREATININE:3 in the last 72 hours Estimated Creatinine Clearance: 108.3 ml/min (by C-G formula based on Cr of 0.75). No results found for this basename: VANCOTROUGH:2,VANCOPEAK:2,VANCORANDOM:2,GENTTROUGH:2,GENTPEAK:2,GENTRANDOM:2,TOBRATROUGH:2,TOBRAPEAK:2,TOBRARND:2,AMIKACINPEAK:2,AMIKACINTROU:2,AMIKACIN:2, in the last 72 hours   Microbiology: Recent Results (from the past 720 hour(s))  SURGICAL PCR SCREEN     Status: Normal   Collection Time   03/04/11  1:35 PM      Component Value Range Status Comment   MRSA, PCR NEGATIVE  NEGATIVE  Final    Staphylococcus aureus NEGATIVE  NEGATIVE  Final     Medical History: Past Medical History  Diagnosis Date  . Allergy   . Hypertension   . PONV (postoperative nausea and vomiting)     N/V and trouble urinating after surgery   . Chronic kidney disease     right renal cyst   . GERD (gastroesophageal reflux disease)     hx of no problems at present   . Headache     occasional headache   . Arthritis     Lt ankle    Medications:  Scheduled:    . acetaminophen   1,000 mg Intravenous Q6H  . ceFAZolin (ANCEF) IV  2 g Intravenous Once  . ceFAZolin (ANCEF) IV  1 g Intravenous Q8H  . HYDROmorphone      . DISCONTD: ceFAZolin (ANCEF) IV  2 g Intravenous Once  . DISCONTD: cefOXitin  1 g Intravenous 30 min Pre-Op  . DISCONTD: HYDROmorphone       Infustions:    . dextrose 5 % and 0.45% NaCl 100 mL/hr at 03/09/11 1250   Assessment: 45 yr old female s/p laparoscopic renal cyst removal.  IV Ancef for post-op prophylaxis  Goal of Therapy:    Plan:  Continue Ancef 1gm IV q8h.  Follow renal function and will adjust dose if/when necessary.  Terrilee Files Trefz 03/09/2011,4:36 PM

## 2011-03-10 LAB — CREATININE, FLUID (PLEURAL, PERITONEAL, JP DRAINAGE): Creat, Fluid: 0.7 mg/dL

## 2011-03-10 LAB — CBC
MCH: 26.5 pg (ref 26.0–34.0)
MCV: 77.4 fL — ABNORMAL LOW (ref 78.0–100.0)
Platelets: 264 10*3/uL (ref 150–400)
RDW: 14.8 % (ref 11.5–15.5)

## 2011-03-10 MED ORDER — KETOROLAC TROMETHAMINE 15 MG/ML IJ SOLN: 30.0000 mg | Freq: Three times a day (TID) | INTRAMUSCULAR | Status: DC

## 2011-03-10 MED ORDER — HYDROCODONE-ACETAMINOPHEN 5-500 MG PO TABS: ORAL_TABLET | ORAL | Status: DC

## 2011-03-10 MED ORDER — KETOROLAC TROMETHAMINE 15 MG/ML IJ SOLN
30.0000 mg | Freq: Three times a day (TID) | INTRAMUSCULAR | Status: DC
  Administered 2011-03-10 – 2011-03-11 (×4): 30 mg via INTRAVENOUS
  Filled 2011-03-10 (×3): qty 2
  Filled 2011-03-10: qty 1
  Filled 2011-03-10 (×3): qty 2

## 2011-03-10 MED ORDER — OXYCODONE-ACETAMINOPHEN 5-325 MG PO TABS
1.0000 | ORAL_TABLET | ORAL | Status: DC | PRN
  Administered 2011-03-10: 2 via ORAL
  Filled 2011-03-10: qty 2

## 2011-03-10 MED ORDER — PROMETHAZINE HCL 25 MG/ML IJ SOLN: 12.5000 mg | Freq: Four times a day (QID) | INTRAMUSCULAR | Status: DC | PRN

## 2011-03-10 MED ORDER — MENTHOL 3 MG MT LOZG
1.0000 | LOZENGE | OROMUCOSAL | Status: DC | PRN
  Filled 2011-03-10: qty 9

## 2011-03-10 NOTE — Progress Notes (Signed)
1 Day Post-Op Subjective: The patient is doing well other than some nausea and vomiting. Pain is adequately controlled.   Objective: Vital signs in last 24 hours: Temp:  [97.5 F (36.4 C)-98.1 F (36.7 C)] 97.6 F (36.4 C) (11/08 0551) Pulse Rate:  [42-56] 55  (11/08 0551) Resp:  [13-21] 16  (11/08 0551) BP: (125-169)/(57-90) 151/90 mmHg (11/08 0551) SpO2:  [98 %-100 %] 99 % (11/08 0551) Weight:  [99.5 kg (219 lb 5.7 oz)] 219 lb 5.7 oz (99.5 kg) (11/07 1545)  Intake/Output from previous day: 11/07 0701 - 11/08 0700 In: 5150 [I.V.:5000; IV Piggyback:150] Out: 1610 [Urine:3675; Emesis/NG output:1; Drains:11; Blood:25] Intake/Output this shift:    Physical Exam:  Resp: clear to auscultation bilaterally Cardio: regular rate and rhythm Abdominal: dressings dry. Appropriate tenderness. BS minimal  Lab Results:  Basename 03/10/11 0510  HGB 11.7*  HCT 34.2*    Assessment/Plan: A: POD 1 lap assisted cyst excision. Doing well except for nausea/emesis.  P: 1. Will check drain for creat.      2. D/C dilaudid, add phenergan, advance diet as tol.   Bertram Millard. Erandy Mceachern, MD  03/10/2011, 7:27 AM

## 2011-03-10 NOTE — Progress Notes (Signed)
Utilization review completed.  

## 2011-03-10 NOTE — Progress Notes (Signed)
Pt still complaining of abdominal discomfort after the IV toradol. MD notified and order received from Dr. Vernie Ammons.  Will continue to monitor. Kathie Dike D

## 2011-03-11 MED ORDER — OXYCODONE-ACETAMINOPHEN 5-325 MG PO TABS: 1.0000 | ORAL_TABLET | ORAL | Status: AC | PRN

## 2011-03-13 LAB — TYPE AND SCREEN
ABO/RH(D): B POS
Antibody Screen: POSITIVE
Unit division: 0
Unit division: 0

## 2011-03-14 ENCOUNTER — Other Ambulatory Visit: Payer: BC Managed Care – PPO

## 2011-03-14 ENCOUNTER — Encounter (HOSPITAL_COMMUNITY): Payer: Self-pay | Admitting: Urology

## 2011-03-14 ENCOUNTER — Telehealth: Payer: Self-pay | Admitting: Family Medicine

## 2011-03-14 DIAGNOSIS — I10 Essential (primary) hypertension: Secondary | ICD-10-CM

## 2011-03-14 LAB — LIPID PANEL
LDL Cholesterol: 76 mg/dL (ref 0–99)
Total CHOL/HDL Ratio: 3.6 Ratio

## 2011-03-14 NOTE — Telephone Encounter (Signed)
Form dropped off to be filled out.  Please call when completed. °

## 2011-03-14 NOTE — Progress Notes (Signed)
flp done today [name] 

## 2011-03-15 ENCOUNTER — Encounter: Payer: Self-pay | Admitting: Family Medicine

## 2011-03-15 NOTE — Telephone Encounter (Signed)
[facility] Provider Form completed.  Left message on patient's voicemail that form was ready to be picked up at the front desk.  Ileana Ladd

## 2011-03-18 NOTE — Discharge Summary (Signed)
Patient ID: [name] MRN: [id number] DOB/AGE: 1965/05/21 45 y.o.  Admit date: 03/09/2011 Discharge date: 03/18/2011  Primary Care Physician:  Carney Living, MD, MD  Discharge Diagnoses:   Complex right renal cyst Consults:  none   Discharge Medications: Discharge Medication List as of 03/11/2011  8:24 AM    START taking these medications   Details  oxyCODONE-acetaminophen (PERCOCET) 5-325 MG per tablet Take 1-2 tablets by mouth every 4 (four) hours as needed., Starting 03/11/2011, Until Mon 03/21/11, Print      CONTINUE these medications which have NOT CHANGED   Details  aspirin 81 MG tablet Take 81 mg by mouth daily. , Until Discontinued, Historical Med    ibuprofen (ADVIL,MOTRIN) 800 MG tablet Take 800 mg by mouth 2 (two) times daily as needed. For pain, Until Discontinued, Historical Med    losartan (COZAAR) 50 MG tablet Take 50 mg by mouth daily. , Until Discontinued, Historical Med    spironolactone (ALDACTONE) 25 MG tablet Take 25 mg by mouth daily. , Until Discontinued, Historical Med    vitamin E 400 UNIT capsule Take 400 Units by mouth daily. , Until Discontinued, Historical Med    zolpidem (AMBIEN) 10 MG tablet Take 5 mg by mouth at bedtime as needed. , Until Discontinued, Historical Med      STOP taking these medications     HYDROcodone-acetaminophen (VICODIN) 5-500 MG per tablet          Significant Diagnostic Studies:  No results found.  Brief H and P: For complete details please refer to admission H and P, but in brief   Hospital Course:  The patient was admitted directly to the operating room. She underwent laparoscopic-assisted excision of a right renal cyst. Her operation went without difficulty. Postoperatively, she experienced significant nausea and some vomiting which was common for her after surgery. On postoperative day #2, she was tolerating a regular diet, was without pain, it and ambulating without difficulty. Urinary output was  excellent. Her hematocrit was stable. Her wounds were healing well at that time, and she was discharged to home.  Day of Discharge BP 144/89  Pulse 58  Temp(Src) 97.9 F (36.6 C) (Oral)  Resp 18  Ht 5\' 8"  (1.727 m)  Wt 99.5 kg (219 lb 5.7 oz)  BMI 33.35 kg/m2  SpO2 92%  No results found for this or any previous visit (from the past 24 hour(s)).  Physical Exam: General: Alert and awake oriented x3 not in any acute distress. HEENT: anicteric sclera, pupils reactive to light and accommodation CVS: S1-S2 clear no murmur rubs or gallops Chest: clear to auscultation bilaterally, no wheezing rales or rhonchi Abdomen: soft nontender, nondistended, normal bowel sounds, no organomegaly Extremities: no cyanosis, clubbing or edema noted bilaterally Neuro: Cranial nerves II-XII intact, no focal neurological deficits  Disposition: Home  Diet: regular  Activity: regular   Disposition and Follow-up: Discharge Orders    Future Appointments: Provider: Department: Dept Phone: Center:   04/04/2011 3:30 PM [facility] Mammo [facility] Mamm[facility] [phone] [facility]   04/04/2011 3:40 PM [facility] [place] [facility] Ul[facility]ound [phone] [facility]     Future Orders Please Complete By Expires   Diet - low sodium heart healthy      Increase activity slowly      Discharge instructions      Comments:   Keep appt w/ Dr. Retta Diones OK to drive in 1-2 days        TESTS THAT NEED FOLLOW-UP  DISCHARGE FOLLOW-UP   Time spent on Discharge: 5 mins  Signed: Chelsea Aus 03/18/2011, 8:30 AM

## 2011-04-04 ENCOUNTER — Ambulatory Visit
Admission: RE | Admit: 2011-04-04 | Discharge: 2011-04-04 | Disposition: A | Discharge status: Home / Self Care | Payer: BC Managed Care – PPO | Source: Ambulatory Visit | Attending: Family Medicine | Admitting: Family Medicine

## 2011-04-04 DIAGNOSIS — N63 Unspecified lump in unspecified breast: Secondary | ICD-10-CM

## 2011-07-04 ENCOUNTER — Ambulatory Visit (INDEPENDENT_AMBULATORY_CARE_PROVIDER_SITE_OTHER): Payer: BC Managed Care – PPO | Admitting: Family Medicine

## 2011-07-04 ENCOUNTER — Encounter: Payer: Self-pay | Admitting: Family Medicine

## 2011-07-04 VITALS — BP 140/83 | HR 97 | Temp 98.2°F | Ht 68.0 in | Wt 212.0 lb

## 2011-07-04 DIAGNOSIS — Z23 Encounter for immunization: Secondary | ICD-10-CM

## 2011-07-04 DIAGNOSIS — I10 Essential (primary) hypertension: Secondary | ICD-10-CM

## 2011-07-04 DIAGNOSIS — G4700 Insomnia, unspecified: Secondary | ICD-10-CM | POA: Insufficient documentation

## 2011-07-04 MED ORDER — LOSARTAN POTASSIUM 50 MG PO TABS: 50.0000 mg | ORAL_TABLET | Freq: Every day | ORAL | Status: DC

## 2011-07-04 NOTE — Progress Notes (Signed)
  Subjective:    Patient ID: [name], female    DOB: Jun 16, 1965, 46 y.o.   MRN: [id number]  HPI  HYPERTENSION Disease Monitoring Home BP Monitoring she did not bring in her readings Chest pain- no     Dyspnea-  no  Medications Compliance: taking as prescribed. Lightheadedness-  no  Edema-  no   ROS - See HPI  PMH Lab Review   Potassium  Date Value Range Status  03/04/2011 3.5  3.5-5.1 (mEq/L) Final  03/18/2009 3.0* 3.3-4.7 (mEq/L) Final     Sodium  Date Value Range Status  03/04/2011 139  135-145 (mEq/L) Final  03/18/2009 136  128-145 (mEq/L) Final       Insomnia Having trouble getting to sleep and falls asleep in the evenings after work.  Thinks she might snore and sometimes awakes with a start.   Used 1/2 of ambien but did not seem to help.  No orthopnea or leg swelling.  Has been trying to lose weight   Review of Symptoms - see HPI  PMH - Smoking status noted.      Review of Systems     Objective:   Physical Exam  Heart - Regular rate and rhythm.  No murmurs, gallops or rubs.    Lungs:  Normal respiratory effort, chest expands symmetrically. Lungs are clear to auscultation, no crackles or wheezes. Extremities:  No cyanosis, edema, or deformity noted with good range of motion of all major joints.         Assessment & Plan:

## 2011-07-04 NOTE — Assessment & Plan Note (Signed)
Better controlled.  Continue current medications.  Normal K despite acei and spironolactone.

## 2011-07-04 NOTE — Patient Instructions (Addendum)
Keep working on the [employer] so far - Aim is to lose 2 lbs a week  Try no sleeping during the day at all.   If you notice you are still very sleepy during the day or are snoring  A lot at night then call me to schedule a sleep study  Call if your blood pressure is regularly > 140/90  Pap Smear in Sept 2013

## 2011-07-04 NOTE — Assessment & Plan Note (Signed)
Worsening Discussed sleep hygiene and expectations.   May have component of sleep apnea.  If daytime sleepiness persists despite better hygiene may need sleep study.

## 2011-07-07 ENCOUNTER — Other Ambulatory Visit: Payer: Self-pay | Admitting: Family Medicine

## 2011-07-07 NOTE — Telephone Encounter (Signed)
Refill request

## 2011-09-09 ENCOUNTER — Telehealth: Payer: Self-pay | Admitting: *Deleted

## 2011-09-09 ENCOUNTER — Other Ambulatory Visit: Payer: Self-pay | Admitting: Family Medicine

## 2011-09-09 MED ORDER — SPIRONOLACTONE 25 MG PO TABS: 25.0000 mg | ORAL_TABLET | Freq: Every day | ORAL | Status: DC

## 2011-09-09 NOTE — Telephone Encounter (Signed)
Patient is calling because she needs a refill on her Spironolactone.  She isn't sure if she needs to be seen first or if a refill can be sent to [facility] on [address].  She said to let her know either way, if she does not answer her phone, it is ok to leave a message.

## 2011-09-09 NOTE — Telephone Encounter (Signed)
Pt informed. [name]  

## 2011-09-09 NOTE — Telephone Encounter (Signed)
Please notify her I sent in ERx

## 2011-09-09 NOTE — Telephone Encounter (Signed)
Received call from [facility].  Patient has Rx for spironolactone 25mg  and her insurance is requiring a 90-day supply.  Will route note to Dr. Deirdre Priest and call pharmacy back.  Gaylene Brooks, RN

## 2011-09-12 MED ORDER — SPIRONOLACTONE 25 MG PO TABS: 25.0000 mg | ORAL_TABLET | Freq: Every day | ORAL | Status: DC

## 2011-09-12 NOTE — Telephone Encounter (Signed)
Sent in new Rx for 90  thanks

## 2012-01-25 ENCOUNTER — Encounter (HOSPITAL_COMMUNITY): Payer: Self-pay

## 2012-01-25 ENCOUNTER — Emergency Department (HOSPITAL_COMMUNITY)
Admission: EM | Admit: 2012-01-25 | Discharge: 2012-01-26 | Disposition: A | Discharge status: Home / Self Care | Payer: BC Managed Care – PPO | Attending: Emergency Medicine | Admitting: Emergency Medicine

## 2012-01-25 DIAGNOSIS — R110 Nausea: Secondary | ICD-10-CM | POA: Insufficient documentation

## 2012-01-25 DIAGNOSIS — K219 Gastro-esophageal reflux disease without esophagitis: Secondary | ICD-10-CM | POA: Insufficient documentation

## 2012-01-25 DIAGNOSIS — E876 Hypokalemia: Secondary | ICD-10-CM

## 2012-01-25 DIAGNOSIS — I10 Essential (primary) hypertension: Secondary | ICD-10-CM | POA: Insufficient documentation

## 2012-01-25 DIAGNOSIS — M19079 Primary osteoarthritis, unspecified ankle and foot: Secondary | ICD-10-CM | POA: Insufficient documentation

## 2012-01-25 DIAGNOSIS — R42 Dizziness and giddiness: Secondary | ICD-10-CM | POA: Insufficient documentation

## 2012-01-25 NOTE — ED Notes (Signed)
Per pt has had dizziness and nausea for 3 days.  Pt received flu shot last Thursday.  Pt states started feeling bad soon after shot.  No vomiting.  No syncope.  No new medications.

## 2012-01-26 LAB — URINALYSIS, ROUTINE W REFLEX MICROSCOPIC
Glucose, UA: NEGATIVE mg/dL
Leukocytes, UA: NEGATIVE
Nitrite: NEGATIVE
Protein, ur: NEGATIVE mg/dL
pH: 6 (ref 5.0–8.0)

## 2012-01-26 LAB — CBC WITH DIFFERENTIAL/PLATELET
Basophils Absolute: 0 10*3/uL (ref 0.0–0.1)
Basophils Relative: 0 % (ref 0–1)
Eosinophils Absolute: 0.1 10*3/uL (ref 0.0–0.7)
Hemoglobin: 13.2 g/dL (ref 12.0–15.0)
MCH: 27.5 pg (ref 26.0–34.0)
MCHC: 35.4 g/dL (ref 30.0–36.0)
Monocytes Relative: 8 % (ref 3–12)
Neutro Abs: 4.1 10*3/uL (ref 1.7–7.7)
Neutrophils Relative %: 54 % (ref 43–77)
Platelets: 252 10*3/uL (ref 150–400)

## 2012-01-26 LAB — BASIC METABOLIC PANEL
BUN: 11 mg/dL (ref 6–23)
Chloride: 99 mEq/L (ref 96–112)
GFR calc Af Amer: >90 mL/min (ref >90)
GFR calc non Af Amer: >90 mL/min (ref >90)
Potassium: 3.2 mEq/L — ABNORMAL LOW (ref 3.5–5.1)
Sodium: 134 mEq/L — ABNORMAL LOW (ref 135–145)

## 2012-01-26 LAB — PREGNANCY, URINE: Preg Test, Ur: NEGATIVE

## 2012-01-26 MED ORDER — POTASSIUM CHLORIDE 20 MEQ/15ML (10%) PO LIQD
20.0000 meq | Freq: Once | ORAL | Status: AC
  Administered 2012-01-26: 20 meq via ORAL
  Filled 2012-01-26: qty 15

## 2012-01-26 MED ORDER — PROMETHAZINE HCL 25 MG PO TABS: 25.0000 mg | ORAL_TABLET | Freq: Four times a day (QID) | ORAL | Status: DC | PRN

## 2012-01-26 MED ORDER — SODIUM CHLORIDE 0.9 % IV SOLN
Freq: Once | INTRAVENOUS | Status: AC
  Administered 2012-01-26: 1000 mL via INTRAVENOUS

## 2012-01-26 MED ORDER — POTASSIUM CHLORIDE 2 MEQ/ML FOR ORAL USE: 20.0000 meq | Freq: Two times a day (BID) | ORAL | Status: DC

## 2012-01-26 NOTE — ED Provider Notes (Signed)
History     CSN: [id number]  Arrival date & time 01/25/12  2314   First MD Initiated Contact with Patient 01/26/12 0146      Chief Complaint  Patient presents with  . Dizziness    (Consider location/radiation/quality/duration/timing/severity/associated sxs/prior treatment) HPI Is a 46 year old black female with several day history of dizziness and nausea. She characterizes the dizziness vaguely, saying it makes her feel "swimmy headed". It is worse with standing. She is not sure if it is worse with head movements. She's also been intermittently nauseated for the past several days. The nausea is not always coincidental with the dizziness.  Past Medical History  Diagnosis Date  . Allergy   . Hypertension   . PONV (postoperative nausea and vomiting)     N/V and trouble urinating after surgery   . Chronic kidney disease     right renal cyst   . GERD (gastroesophageal reflux disease)     hx of no problems at present   . Headache     occasional headache   . Arthritis     Lt ankle    Past Surgical History  Procedure Date  . Breast surgery 2010    lumpectomy Lt breast  . Tubal ligation 1988  . Fracture surgery 2011    Lt ankle surgeries x 3   . Joint replacement     left ankle 8/12   . Other surgical history     2 hand surgeries on left and shoulder surgery on right shoulder   . Endometrial ablation October 18 2010    at [facility]  . Laparoscopic nephrectomy 03/09/2011    Procedure: LAPAROSCOPIC NEPHRECTOMY;  Surgeon: Marcine Matar;  Location: [facility];  Service: Urology;  Laterality: Right;  Laparoscopic Assisted Removal Of Renal Cyst     Family History  Problem Relation Age of Onset  . Diabetes Mother   . Heart disease Mother   . Hypertension Father   . Diabetes Sister   . Kidney disease Sister   . Hypertension Brother   . Kidney disease Brother   . Arthritis Brother   . Cancer Maternal Grandmother   . Diabetes Sister   . Hypertension Sister   . Hypertension  Sister   . Hypertension Sister   . Hypertension Brother   . Mental illness Brother     History  Substance Use Topics  . Smoking status: Never Smoker   . Smokeless tobacco: Not on file  . Alcohol Use: No    OB History    Grav Para Term Preterm Abortions TAB SAB Ect Mult Living                  Review of Systems  All other systems reviewed and are negative.    Allergies  Tetracycline hcl; Septra; and Sulfamethoxazole w-trimethoprim  Home Medications   Current Outpatient Rx  Name Route Sig Dispense Refill  . ASPIRIN EC 81 MG PO TBEC Oral Take 81 mg by mouth daily.    Marland Kitchen LOSARTAN POTASSIUM 50 MG PO TABS Oral Take 1 tablet (50 mg total) by mouth daily. 90 tablet 3  . SPIRONOLACTONE 25 MG PO TABS Oral Take 1 tablet (25 mg total) by mouth daily. 90 tablet 3  . VITAMIN E 400 UNITS PO CAPS Oral Take 400 Units by mouth daily.     . IBUPROFEN 800 MG PO TABS Oral Take 800 mg by mouth 2 (two) times daily as needed. For pain    . ZOLPIDEM TARTRATE 10  MG PO TABS Oral Take 5 mg by mouth at bedtime as needed. For sleep      BP 172/79  Pulse 70  Temp 99.1 F (37.3 C) (Oral)  Resp 18  SpO2 100%  Physical Exam General: Well-developed, well-nourished female in no acute distress; appearance consistent with age of record HENT: normocephalic, atraumatic Eyes: pupils equal round and reactive to light; extraocular muscles intact; no nystagmus Neck: supple Heart: regular rate and rhythm; no ectopy Lungs: clear to auscultation bilaterally Abdomen: soft; nondistended Extremities: No deformity; full range of motion; pulses normal Neurologic: Awake, alert and oriented; motor function intact in all extremities and symmetric; no facial droop; normal finger to nose; negative Romberg; normal coordination and speech Skin: Warm and dry Psychiatric: Normal mood and affect    ED Course  Procedures (including critical care time)    MDM   Nursing notes and vitals signs, including pulse  oximetry, reviewed.  Summary of this visit's results, reviewed by myself:  Labs:  Results for orders placed during the hospital encounter of 01/25/12  URINALYSIS, ROUTINE W REFLEX MICROSCOPIC      Component Value Range   Color, Urine YELLOW  YELLOW   APPearance CLEAR  CLEAR   Specific Gravity, Urine 1.017  1.005 - 1.030   pH 6.0  5.0 - 8.0   Glucose, UA NEGATIVE  NEGATIVE mg/dL   Hgb urine dipstick NEGATIVE  NEGATIVE   Bilirubin Urine NEGATIVE  NEGATIVE   Ketones, ur NEGATIVE  NEGATIVE mg/dL   Protein, ur NEGATIVE  NEGATIVE mg/dL   Urobilinogen, UA 1.0  0.0 - 1.0 mg/dL   Nitrite NEGATIVE  NEGATIVE   Leukocytes, UA NEGATIVE  NEGATIVE  PREGNANCY, URINE      Component Value Range   Preg Test, Ur NEGATIVE  NEGATIVE  CBC WITH DIFFERENTIAL      Component Value Range   WBC 7.6  4.0 - 10.5 K/uL   RBC 4.80  3.87 - 5.11 MIL/uL   Hemoglobin 13.2  12.0 - 15.0 g/dL   HCT 16.1  09.6 - 04.5 %   MCV 77.7 (*) 78.0 - 100.0 fL   MCH 27.5  26.0 - 34.0 pg   MCHC 35.4  30.0 - 36.0 g/dL   RDW 40.9  81.1 - 91.4 %   Platelets 252  150 - 400 K/uL   Neutrophils Relative 54  43 - 77 %   Neutro Abs 4.1  1.7 - 7.7 K/uL   Lymphocytes Relative 37  12 - 46 %   Lymphs Abs 2.8  0.7 - 4.0 K/uL   Monocytes Relative 8  3 - 12 %   Monocytes Absolute 0.6  0.1 - 1.0 K/uL   Eosinophils Relative 1  0 - 5 %   Eosinophils Absolute 0.1  0.0 - 0.7 K/uL   Basophils Relative 0  0 - 1 %   Basophils Absolute 0.0  0.0 - 0.1 K/uL  BASIC METABOLIC PANEL      Component Value Range   Sodium 134 (*) 135 - 145 mEq/L   Potassium 3.2 (*) 3.5 - 5.1 mEq/L   Chloride 99  96 - 112 mEq/L   CO2 22  19 - 32 mEq/L   Glucose, Bld 90  70 - 99 mg/dL   BUN 11  6 - 23 mg/dL   Creatinine, Ser 7.82  0.50 - 1.10 mg/dL   Calcium 9.0  8.4 - 95.6 mg/dL   GFR calc non Af Amer >90  >90 mL/min   GFR  calc Af Amer >90  >90 mL/min   3:47 AM Patient advised of lab findings. She states she has a history of hypokalemia in the past. She also  requests a prescription for Phenergan which has helped her symptoms in the past.        Hanley Seamen, MD 01/26/12 [phone]

## 2012-01-26 NOTE — ED Notes (Signed)
Pt able to ambulate to restroom ?

## 2012-02-01 ENCOUNTER — Encounter: Payer: Self-pay | Admitting: Family Medicine

## 2012-02-01 ENCOUNTER — Other Ambulatory Visit (HOSPITAL_COMMUNITY)
Admission: RE | Admit: 2012-02-01 | Discharge: 2012-02-01 | Disposition: A | Discharge status: Home / Self Care | Payer: BC Managed Care – PPO | Source: Ambulatory Visit | Attending: Family Medicine | Admitting: Family Medicine

## 2012-02-01 ENCOUNTER — Ambulatory Visit (INDEPENDENT_AMBULATORY_CARE_PROVIDER_SITE_OTHER): Payer: BC Managed Care – PPO | Admitting: Family Medicine

## 2012-02-01 VITALS — BP 130/85 | HR 58 | Temp 98.2°F | Ht 68.0 in | Wt 211.0 lb

## 2012-02-01 DIAGNOSIS — I10 Essential (primary) hypertension: Secondary | ICD-10-CM

## 2012-02-01 DIAGNOSIS — Z01419 Encounter for gynecological examination (general) (routine) without abnormal findings: Secondary | ICD-10-CM | POA: Insufficient documentation

## 2012-02-01 DIAGNOSIS — Z124 Encounter for screening for malignant neoplasm of cervix: Secondary | ICD-10-CM

## 2012-02-01 LAB — BASIC METABOLIC PANEL
CO2: 21 mEq/L (ref 19–32)
Chloride: 105 mEq/L (ref 96–112)
Potassium: 4 mEq/L (ref 3.5–5.3)
Sodium: 135 mEq/L (ref 135–145)

## 2012-02-01 NOTE — Patient Instructions (Addendum)
I will call you if your tests are not good.  Otherwise I will send you a letter.  If you do not hear from me with in 2 weeks please call our office.   If you sign up for MyChart you can see your lab tests  You are up to date on all your tests.  Your main health work is to monitor your blood pressure (should be less than 140/90) and to continue to exercise and control your weight

## 2012-02-01 NOTE — Progress Notes (Signed)
  Subjective:    Patient ID: MITSUKO LUERA, female    DOB: [birth year], 46 y.o.   MRN: [id number]  HPI  Hypertension low potassium Seen in ED last week for dizziness.  Found to have low K and given replacement.  Felt better since then.  Taking all her blood pressure medications.  No chest pain or shortness of breath or lightheadness.  Her readings have been controlled    Review of Systems     Objective:   Physical Exam Genitalia:  Normal introitus for age, no external lesions, no vaginal discharge, mucosa pink and moist, no vaginal or cervical lesions, no vaginal atrophy, no friaility or hemorrhage, normal uterus size and position, no adnexal masses or tenderness Heart - Regular rate and rhythm.  No murmurs, gallops or rubs.    Lungs:  Normal respiratory effort, chest expands symmetrically. Lungs are clear to auscultation, no crackles or wheezes.        Assessment & Plan:   Normal Gyn Exam

## 2012-02-01 NOTE — Assessment & Plan Note (Signed)
Well controlled readings but her hypokalemia is puzzling given she is on spironolactone and and ARB and K supplement.   Will recheck

## 2012-02-02 ENCOUNTER — Encounter: Payer: Self-pay | Admitting: Family Medicine

## 2012-02-13 ENCOUNTER — Other Ambulatory Visit: Payer: Self-pay | Admitting: Family Medicine

## 2012-02-13 DIAGNOSIS — Z1231 Encounter for screening mammogram for malignant neoplasm of breast: Secondary | ICD-10-CM

## 2012-02-15 ENCOUNTER — Telehealth: Payer: Self-pay | Admitting: Family Medicine

## 2012-02-15 NOTE — Telephone Encounter (Signed)
Patient is calling for her results from her appt a week or so ago.  She also wanted to let Dr. Deirdre Priest know that she tried to go online to check her results, but she received a message that none of her information could be found.

## 2012-02-16 NOTE — Telephone Encounter (Signed)
Please let her know   Her labs were all normal including her potassium  She should call [phone] if she has questions about MyChart  Thanks  LC

## 2012-02-16 NOTE — Telephone Encounter (Signed)
Patient informed of message from MD also informed of normal pap, expressed understanding.

## 2012-04-20 ENCOUNTER — Encounter: Payer: Self-pay | Admitting: Family Medicine

## 2012-04-20 ENCOUNTER — Ambulatory Visit (INDEPENDENT_AMBULATORY_CARE_PROVIDER_SITE_OTHER): Payer: BC Managed Care – PPO | Admitting: Family Medicine

## 2012-04-20 VITALS — BP 142/92 | HR 97 | Temp 102.6°F | Ht 68.0 in | Wt 216.0 lb

## 2012-04-20 DIAGNOSIS — J111 Influenza due to unidentified influenza virus with other respiratory manifestations: Secondary | ICD-10-CM

## 2012-04-20 DIAGNOSIS — R509 Fever, unspecified: Secondary | ICD-10-CM

## 2012-04-20 HISTORY — DX: Influenza due to unidentified influenza virus with other respiratory manifestations: J11.1

## 2012-04-20 MED ORDER — OSELTAMIVIR PHOSPHATE 75 MG PO CAPS: 75.0000 mg | ORAL_CAPSULE | Freq: Two times a day (BID) | ORAL | Status: DC

## 2012-04-20 MED ORDER — IBUPROFEN 200 MG PO TABS
600.0000 mg | ORAL_TABLET | Freq: Once | ORAL | Status: AC
  Administered 2012-04-20: 600 mg via ORAL

## 2012-04-20 NOTE — Assessment & Plan Note (Addendum)
History c/w flu. Since w/in 4hr time period will give oseltamivir x5 days.  Only concern is that pt does have focal wheezes RLL.  Pt currently febrile to 102.6.  Discussed the possibility of concurrent PNA with pt.  After discussion, pt would like to wait to have CXR done.  Pt agrees to go to UC or come back to clinic on Mon if continues to have fever and/or cough worsens so that CXR can be obtained to r/o RLL PNA.

## 2012-04-20 NOTE — Progress Notes (Signed)
S: Pt comes in today for SDA for flu symptoms.  Patient reports scratchy throat for 2 days, then started having muscle aches and feeling worse all over.  Thought maybe it was from her potassium so drank some orange juice which didn't seem to help.  +chest congestion, + runny nose, +cough.  Has tried alkaseltzer cold and sinus which didn't help and hasn't taken anything else.  +shaking chills.  + sick contacts at work (works at [employer]). Did get flu shot in Oct. Decreased po past day.  No V/D.  + headache since this AM (worse with coughing)   ROS: Per HPI  History  Smoking status  . Never Smoker   Smokeless tobacco  . Not on file    O:  Filed Vitals:   04/20/12 1530  BP: 142/92  Pulse: 97  Temp: 102.6 F (39.2 C)    Gen: NAD but looks uncomfortable  HEENT: MMM, + pharyngeal erythema/irritation without exudate, no cervical LAD. TMs normal bilaterally CV: RRR, no murmur Pulm: + wheezes RLL without crackles/rales/rhonchi, otherwise clear    A/P: 46 y.o. female p/w likely influenza but with some concern for concurrent PNA -See problem list -f/u in 3 days if not improved

## 2012-04-20 NOTE — Patient Instructions (Addendum)
It was nice to meet you.  I'm sorry you feel so badly!  I sent in a medicine to help hopefully shorten how long you have symptoms for.  You will take the medicine 2 times per day for 5 days.  Make sure you are drinking plenty of fluids.  Use Tylenol or Motrin for fever and muscle aches as needed.  You can use any throat spray or lozenges to help with your scratchy throat.   We will hold off on the chest xray.  HOWEVER, if you are still having fever and/or start having cough on Sunday or Monday please go to Urgent Care/ER or come here so that we can make sure you don't have a pneumonia.   Influenza Facts Flu (influenza) is a contagious respiratory illness caused by the influenza viruses. It can cause mild to severe illness. While most healthy people recover from the flu without specific treatment and without complications, older people, young children, and people with certain health conditions are at higher risk for serious complications from the flu, including death. CAUSES   The flu virus is spread from person to person by respiratory droplets from coughing and sneezing.  A person can also become infected by touching an object or surface with a virus on it and then touching their mouth, eye or nose.  Adults may be able to infect others from 1 day before symptoms occur and up to 7 days after getting sick. So it is possible to give someone the flu even before you know you are sick and continue to infect others while you are sick. SYMPTOMS   Fever (usually high).  Headache.  Tiredness (can be extreme).  Cough.  Sore throat.  Runny or stuffy nose.  Body aches.  Diarrhea and vomiting may also occur, particularly in children.  These symptoms are referred to as "flu-like symptoms". A lot of different illnesses, including the common cold, can have similar symptoms. DIAGNOSIS   There are tests that can determine if you have the flu as long you are tested within the first 2 or 3 days  of illness.  A doctor's exam and additional tests may be needed to identify if you have a disease that is a complicating the flu. RISKS AND COMPLICATIONS  Some of the complications caused by the flu include:  Bacterial pneumonia or progressive pneumonia caused by the flu virus.  Loss of body fluids (dehydration).  Worsening of chronic medical conditions, such as heart failure, asthma, or diabetes.  Sinus problems and ear infections. HOME CARE INSTRUCTIONS   Seek medical care early on.  If you are at high risk from complications of the flu, consult your health-care provider as soon as you develop flu-like symptoms. Those at high risk for complications include:  People 65 years or older.  People with chronic medical conditions, including diabetes.  Pregnant women.  Young children.  Your caregiver may recommend use of an antiviral medication to help treat the flu.  If you get the flu, get plenty of rest, drink a lot of liquids, and avoid using alcohol and tobacco.  You can take over-the-counter medications to relieve the symptoms of the flu if your caregiver approves. (Never give aspirin to children or teenagers who have flu-like symptoms, particularly fever). PREVENTION  The single best way to prevent the flu is to get a flu vaccine each fall. Other measures that can help protect against the flu are:  Antiviral Medications  A number of antiviral drugs are approved for use  in preventing the flu. These are prescription medications, and a doctor should be consulted before they are used.  Habits for Good Health  Cover your nose and mouth with a tissue when you cough or sneeze, throw the tissue away after you use it.  Wash your hands often with soap and water, especially after you cough or sneeze. If you are not near water, use an alcohol-based hand cleaner.  Avoid people who are sick.  If you get the flu, stay home from work or school. Avoid contact with other people so that  you do not make them sick, too.  Try not to touch your eyes, nose, or mouth as germs ore often spread this way. IN CHILDREN, EMERGENCY WARNING SIGNS THAT NEED URGENT MEDICAL ATTENTION:  Fast breathing or trouble breathing.  Bluish skin color.  Not drinking enough fluids.  Not waking up or not interacting.  Being so irritable that the child does not want to be held.  Flu-like symptoms improve but then return with fever and worse cough.  Fever with a rash. IN ADULTS, EMERGENCY WARNING SIGNS THAT NEED URGENT MEDICAL ATTENTION:  Difficulty breathing or shortness of breath.  Pain or pressure in the chest or abdomen.  Sudden dizziness.  Confusion.  Severe or persistent vomiting. SEEK IMMEDIATE MEDICAL CARE IF:  You or someone you know is experiencing any of the symptoms above. When you arrive at the emergency center,report that you think you have the flu. You may be asked to wear a mask and/or sit in a secluded area to protect others from getting sick. MAKE SURE YOU:   Understand these instructions.  Monitor your condition.  Seek medical care if you are getting worse, or not improving. Document Released: 04/21/2003 Document Revised: 07/11/2011 Document Reviewed: 01/15/2009 [facility] Patient Information 2013 [place], [place].

## 2012-04-23 ENCOUNTER — Ambulatory Visit: Payer: BC Managed Care – PPO | Admitting: Family Medicine

## 2012-05-14 ENCOUNTER — Ambulatory Visit
Admission: RE | Admit: 2012-05-14 | Discharge: 2012-05-14 | Disposition: A | Discharge status: Home / Self Care | Payer: BC Managed Care – PPO | Source: Ambulatory Visit | Attending: Family Medicine | Admitting: Family Medicine

## 2012-05-14 DIAGNOSIS — Z1231 Encounter for screening mammogram for malignant neoplasm of breast: Secondary | ICD-10-CM

## 2012-05-17 ENCOUNTER — Other Ambulatory Visit: Payer: Self-pay | Admitting: Family Medicine

## 2012-05-17 DIAGNOSIS — R928 Other abnormal and inconclusive findings on diagnostic imaging of breast: Secondary | ICD-10-CM

## 2012-05-22 ENCOUNTER — Ambulatory Visit
Admission: RE | Admit: 2012-05-22 | Discharge: 2012-05-22 | Disposition: A | Discharge status: Home / Self Care | Payer: BC Managed Care – PPO | Source: Ambulatory Visit | Attending: Family Medicine | Admitting: Family Medicine

## 2012-05-22 DIAGNOSIS — R928 Other abnormal and inconclusive findings on diagnostic imaging of breast: Secondary | ICD-10-CM

## 2012-06-06 ENCOUNTER — Ambulatory Visit (INDEPENDENT_AMBULATORY_CARE_PROVIDER_SITE_OTHER): Payer: BC Managed Care – PPO | Admitting: Family Medicine

## 2012-06-06 ENCOUNTER — Encounter: Payer: Self-pay | Admitting: Family Medicine

## 2012-06-06 VITALS — BP 135/75 | HR 67 | Temp 98.8°F | Ht 67.5 in | Wt 219.3 lb

## 2012-06-06 DIAGNOSIS — K589 Irritable bowel syndrome without diarrhea: Secondary | ICD-10-CM

## 2012-06-06 DIAGNOSIS — R109 Unspecified abdominal pain: Secondary | ICD-10-CM

## 2012-06-06 LAB — POCT URINALYSIS DIPSTICK
Bilirubin, UA: NEGATIVE
Glucose, UA: NEGATIVE
Spec Grav, UA: 1.02
pH, UA: 6

## 2012-06-06 LAB — POCT UA - MICROSCOPIC ONLY

## 2012-06-06 MED ORDER — POLYETHYLENE GLYCOL 3350 17 GM/SCOOP PO POWD: 17.0000 g | Freq: Two times a day (BID) | ORAL | Status: DC | PRN

## 2012-06-06 NOTE — Patient Instructions (Addendum)
We will check your urine today and call you if anything is abnormal. If you start to notice a rash over your abdomen, please call us immediately or return to clinic. Continue to take Ibuprofen as needed for pain. Schedule follow up with your PCP in 2 weeks or sooner if symptoms do not improve.  Shingles Shingles is caused by the same virus that causes chickenpox (varicella zoster virus or VZV). Shingles often occurs many years or decades after having chickenpox. That is why it is more common in adults older than 50 years. The virus reactivates and breaks out as an infection in a nerve root. SYMPTOMS   The initial feeling (sensations) may be pain. This pain is usually described as:  Burning.  Stabbing.  Throbbing.  Tingling in the nerve root.  A red rash will follow in a couple days. The rash may occur in any area of the body and is usually on one side (unilateral) of the body in a band or belt-like pattern. The rash usually starts out as very small blisters (vesicles). They will dry up after 7 to 10 days. This is not usually a significant problem except for the pain it causes.  Long-lasting (chronic) pain is more likely in an elderly person. It can last months to years. This condition is called postherpetic neuralgia. Shingles can be an extremely severe infection in someone with AIDS, a weakened immune system, or with forms of leukemia. It can also be severe if you are taking transplant medicines or other medicines that weaken the immune system. TREATMENT  Your caregiver will often treat you with:  Antiviral drugs.  Anti-inflammatory drugs.  Pain medicines. Bed rest is very important in preventing the pain associated with herpes zoster (postherpetic neuralgia). Application of heat in the form of a hot water bottle or electric heating pad or gentle pressure with the hand is recommended to help with the pain or discomfort. PREVENTION  A varicella zoster vaccine is available to help  protect against the virus. The Food and Drug Administration approved the varicella zoster vaccine for individuals 6 years of age and older. HOME CARE INSTRUCTIONS   Cool compresses to the area of rash may be helpful.  Only take over-the-counter or prescription medicines for pain, discomfort, or fever as directed by your caregiver.  Avoid contact with:  Babies.  Pregnant women.  Children with eczema.  Elderly people with transplants.  People with chronic illnesses, such as leukemia and AIDS.  If the area involved is on your face, you may receive a referral for follow-up to a specialist. It is very important to keep all follow-up appointments. This will help avoid eye complications, chronic pain, or disability. SEEK IMMEDIATE MEDICAL CARE IF:   You develop any pain (headache) in the area of the face or eye. This must be followed carefully by your caregiver or ophthalmologist. An infection in part of your eye (cornea) can be very serious. It could lead to blindness.  You do not have pain relief from prescribed medicines.  Your redness or swelling spreads.  The area involved becomes very swollen and painful.  You have a fever.  You notice any red or painful lines extending away from the affected area toward your heart (lymphangitis).  Your condition is worsening or has changed. Document Released: 04/18/2005 Document Revised: 07/11/2011 Document Reviewed: 03/23/2009 [facility] Patient Information 2013 [place], [place].

## 2012-06-06 NOTE — Assessment & Plan Note (Signed)
Patient is currently constipated.  Sent Rx for Miralax to pharmacy.  Follow up PCP as needed.

## 2012-06-06 NOTE — Addendum Note (Signed)
Addended by: [place], Jaevian Shean on: 06/06/2012 05:43 PM   Modules accepted: Orders

## 2012-06-06 NOTE — Assessment & Plan Note (Signed)
Unknown etiology.  History concerning for shingles, however patient does not have a rash at this time.  Plan to check UA for hematuria or infection.  Counseled patient on red flags if rash were to develop - would need to start anti-viral therapy. RTC in 2 weeks or sooner if symptoms worsen.

## 2012-06-06 NOTE — Progress Notes (Signed)
  Subjective:    Patient ID: [name], female    DOB: [birth year], 47 y.o.   MRN: [id number]  HPI  Patient presents to same day appointment for back and flank pain.  Started out as thoracic back pain which has improved, but now pain has moved to right flank. Symptoms started 3 days ago. Describes pain as shooting, sharp, and constant.  Pain has improved since she has been taking Motrin 800 mg once per day (Rx she has for ankle pain).  Denies any recent trauma or injury to back or side.  Patient says she had surgery of right kidney for cyst removal about 1.5 years ago, but has had no complications since then.  She denies any hx of kidney stones.  She has had chickenpox as a child, but not shingles.  Denies any dysuria or hematuria.  Denies any vaginal bleeding or discharge.  No associated fever, chills, vomiting. Denies any bladder or bowel incontinence.  She does complain of constipation, last BM was 4 days ago.  Review of Systems  Per HPI    Objective:   Physical Exam  Constitutional: She appears well-nourished. No distress.  Abdominal: Soft. She exhibits no distension. There is no tenderness. There is no rebound and no guarding.       Tenderness on palpation of surface of skin on right upper quadrant; patient and Dr. Mauricio Po could palpate a small, round knot underneath skin surface which is very tender; no other skin changes, redness, or induration.   Musculoskeletal:       Back: Full ROM flexion and extension and hip rotation; no bony tenderness  Skin:       Normal skin exam      Assessment & Plan:

## 2012-06-07 ENCOUNTER — Telehealth: Payer: Self-pay | Admitting: Family Medicine

## 2012-06-07 NOTE — Telephone Encounter (Signed)
Please inform patient that Urine was normal.  Thanks.

## 2012-06-07 NOTE — Telephone Encounter (Signed)
Pt was informed,voiced understanding. [name], Virgel Bouquet

## 2012-06-24 ENCOUNTER — Other Ambulatory Visit: Payer: Self-pay | Admitting: Family Medicine

## 2012-07-04 ENCOUNTER — Emergency Department (HOSPITAL_COMMUNITY): Payer: BC Managed Care – PPO

## 2012-07-04 ENCOUNTER — Inpatient Hospital Stay (HOSPITAL_COMMUNITY)
Admission: EM | Admit: 2012-07-04 | Discharge: 2012-07-09 | DRG: 219 | Disposition: A | Discharge status: Home / Self Care | Payer: BC Managed Care – PPO | Attending: Orthopedic Surgery | Admitting: Orthopedic Surgery

## 2012-07-04 ENCOUNTER — Encounter (HOSPITAL_COMMUNITY): Payer: Self-pay | Admitting: *Deleted

## 2012-07-04 DIAGNOSIS — M009 Pyogenic arthritis, unspecified: Secondary | ICD-10-CM | POA: Diagnosis present

## 2012-07-04 DIAGNOSIS — Q619 Cystic kidney disease, unspecified: Secondary | ICD-10-CM

## 2012-07-04 DIAGNOSIS — Y831 Surgical operation with implant of artificial internal device as the cause of abnormal reaction of the patient, or of later complication, without mention of misadventure at the time of the procedure: Secondary | ICD-10-CM | POA: Diagnosis present

## 2012-07-04 DIAGNOSIS — M7989 Other specified soft tissue disorders: Secondary | ICD-10-CM

## 2012-07-04 DIAGNOSIS — Z96669 Presence of unspecified artificial ankle joint: Secondary | ICD-10-CM

## 2012-07-04 DIAGNOSIS — M869 Osteomyelitis, unspecified: Secondary | ICD-10-CM

## 2012-07-04 DIAGNOSIS — K219 Gastro-esophageal reflux disease without esophagitis: Secondary | ICD-10-CM | POA: Diagnosis present

## 2012-07-04 DIAGNOSIS — T8450XA Infection and inflammatory reaction due to unspecified internal joint prosthesis, initial encounter: Principal | ICD-10-CM | POA: Diagnosis present

## 2012-07-04 DIAGNOSIS — M25572 Pain in left ankle and joints of left foot: Secondary | ICD-10-CM

## 2012-07-04 DIAGNOSIS — I10 Essential (primary) hypertension: Secondary | ICD-10-CM | POA: Diagnosis present

## 2012-07-04 DIAGNOSIS — M79609 Pain in unspecified limb: Secondary | ICD-10-CM

## 2012-07-04 LAB — CBC WITH DIFFERENTIAL/PLATELET
Eosinophils Absolute: 0.1 10*3/uL (ref 0.0–0.7)
Hemoglobin: 12.3 g/dL (ref 12.0–15.0)
Lymphocytes Relative: 46 % (ref 12–46)
Lymphs Abs: 3.1 10*3/uL (ref 0.7–4.0)
Neutro Abs: 3 10*3/uL (ref 1.7–7.7)
Neutrophils Relative %: 45 % (ref 43–77)
Platelets: 245 10*3/uL (ref 150–400)
RBC: 4.36 MIL/uL (ref 3.87–5.11)
WBC: 6.8 10*3/uL (ref 4.0–10.5)

## 2012-07-04 LAB — SYNOVIAL CELL COUNT + DIFF, W/ CRYSTALS
Eosinophils-Synovial: 0 % (ref 0–1)
Neutrophil, Synovial: 90 % — ABNORMAL HIGH (ref 0–25)

## 2012-07-04 LAB — BASIC METABOLIC PANEL
CO2: 25 mEq/L (ref 19–32)
Chloride: 103 mEq/L (ref 96–112)
GFR calc non Af Amer: >90 mL/min (ref >90)
Glucose, Bld: 94 mg/dL (ref 70–99)
Potassium: 3.8 mEq/L (ref 3.5–5.1)
Sodium: 136 mEq/L (ref 135–145)

## 2012-07-04 MED ORDER — OXYCODONE HCL 5 MG PO TABS
5.0000 mg | ORAL_TABLET | ORAL | Status: DC | PRN
  Filled 2012-07-04: qty 2

## 2012-07-04 MED ORDER — LOSARTAN POTASSIUM 50 MG PO TABS
50.0000 mg | ORAL_TABLET | Freq: Every day | ORAL | Status: DC
  Administered 2012-07-06 – 2012-07-09 (×4): 50 mg via ORAL
  Filled 2012-07-04 (×5): qty 1

## 2012-07-04 MED ORDER — SPIRONOLACTONE 25 MG PO TABS
25.0000 mg | ORAL_TABLET | Freq: Every day | ORAL | Status: DC
  Administered 2012-07-06 – 2012-07-09 (×4): 25 mg via ORAL
  Filled 2012-07-04 (×5): qty 1

## 2012-07-04 MED ORDER — IBUPROFEN 400 MG PO TABS
800.0000 mg | ORAL_TABLET | Freq: Once | ORAL | Status: AC
  Administered 2012-07-04: 800 mg via ORAL
  Filled 2012-07-04: qty 2

## 2012-07-04 MED ORDER — ZOLPIDEM TARTRATE 5 MG PO TABS: 5.0000 mg | ORAL_TABLET | Freq: Every evening | ORAL | Status: DC | PRN

## 2012-07-04 MED ORDER — ACETAMINOPHEN 650 MG RE SUPP: 650.0000 mg | Freq: Four times a day (QID) | RECTAL | Status: DC | PRN

## 2012-07-04 MED ORDER — SODIUM CHLORIDE 0.9 % IV SOLN
INTRAVENOUS | Status: DC
  Administered 2012-07-05 (×2): via INTRAVENOUS

## 2012-07-04 MED ORDER — ACETAMINOPHEN 325 MG PO TABS: 650.0000 mg | ORAL_TABLET | Freq: Four times a day (QID) | ORAL | Status: DC | PRN

## 2012-07-04 NOTE — ED Provider Notes (Signed)
History    This chart was scribed for non-physician practitioner working with Joya Gaskins, MD by Sofie Rower, ED Scribe. This patient was seen in room TR06C/TR06C and the patient's care was started at 4:48PM.   CSN: [id number]  Arrival date & time 07/04/12  1533   First MD Initiated Contact with Patient 07/04/12 1648      Chief Complaint  Patient presents with  . Ankle Pain    (Consider location/radiation/quality/duration/timing/severity/associated sxs/prior treatment) The history is provided by the patient. No language interpreter was used.    CATLYNN GRONDAHL is a 47 y.o. female , with a hx of left ankle fracture surgery (Performed in 2011) and left ankle replacement replacement (performed by Dr. Cecilie Kicks, August 2012) who presents to the Emergency Department complaining of sudden, progressively worsening, ankle pain located at the left ankle, onset today (07/04/12 @ 2:30PM).  Associated symptoms include calf pain located at the left calf (onset two days ago (07/02/12). The pt reports she has a hx of left ankle replacement and left ankle fracture which may have been aggravated by her consistently having to stand on her feet for prolonged periods of time at her place of employment. The pt characterizes her calf pain as a cramping sensation. The pt has taken ibuprofen which does not provide relief of the left ankle nor left calf pain. Modifying factors include ambulation which intensifies the left ankle pain.  The pt denies CP, SOB, and hemoptysis.   The pt does not smoke or drink alcohol.   PCP is Dr. Deirdre Priest    Past Medical History  Diagnosis Date  . Allergy   . Hypertension   . PONV (postoperative nausea and vomiting)     N/V and trouble urinating after surgery   . Chronic kidney disease     right renal cyst   . GERD (gastroesophageal reflux disease)     hx of no problems at present   . Headache     occasional headache   . Arthritis     Lt ankle    Past Surgical History   Procedure Laterality Date  . Breast surgery  2010    lumpectomy Lt breast  . Tubal ligation  1988  . Fracture surgery  2011    Lt ankle surgeries x 3   . Joint replacement      left ankle 8/12   . Other surgical history      2 hand surgeries on left and shoulder surgery on right shoulder   . Endometrial ablation  October 18 2010    at [facility]  . Laparoscopic nephrectomy  03/09/2011    Procedure: LAPAROSCOPIC NEPHRECTOMY;  Surgeon: Marcine Matar;  Location: [facility];  Service: Urology;  Laterality: Right;  Laparoscopic Assisted Removal Of Renal Cyst     Family History  Problem Relation Age of Onset  . Diabetes Mother   . Heart disease Mother   . Hypertension Father   . Diabetes Sister   . Kidney disease Sister   . Hypertension Brother   . Kidney disease Brother   . Arthritis Brother   . Cancer Maternal Grandmother   . Diabetes Sister   . Hypertension Sister   . Hypertension Sister   . Hypertension Sister   . Hypertension Brother   . Mental illness Brother     History  Substance Use Topics  . Smoking status: Never Smoker   . Smokeless tobacco: Not on file  . Alcohol Use: No    OB History  Grav Para Term Preterm Abortions TAB SAB Ect Mult Living                  Review of Systems  Musculoskeletal: Positive for joint swelling and arthralgias.  All other systems reviewed and are negative.    Allergies  Tetracycline hcl; Septra; and Sulfamethoxazole w-trimethoprim  Home Medications   Current Outpatient Rx  Name  Route  Sig  Dispense  Refill  . aspirin EC 81 MG tablet   Oral   Take 81 mg by mouth daily.         Marland Kitchen losartan (COZAAR) 50 MG tablet   Oral   Take 50 mg by mouth daily.         Marland Kitchen spironolactone (ALDACTONE) 25 MG tablet   Oral   Take 1 tablet (25 mg total) by mouth daily.   90 tablet   3   . vitamin E 400 UNIT capsule   Oral   Take 400 Units by mouth daily.          Marland Kitchen zolpidem (AMBIEN) 10 MG tablet   Oral   Take 5 mg by mouth at  bedtime as needed. For sleep           BP 146/86  Pulse 70  Temp(Src) 98.6 F (37 C) (Oral)  Resp 16  SpO2 99%  Physical Exam  Nursing note and vitals reviewed. Constitutional: She appears well-developed and well-nourished. No distress.  HENT:  Head: Normocephalic and atraumatic.  Eyes: Conjunctivae and EOM are normal.  Neck: Normal range of motion. Neck supple.  Cardiovascular:  Intact distal pulses, capillary refill < 3 seconds  Musculoskeletal: She exhibits tenderness. She exhibits no edema.       Left ankle: She exhibits swelling. Tenderness. Lateral malleolus tenderness found.  Tenderness detected at the Left lateral malleolus. Mild swelling extending from med calf down to forefoot. No pitting edema. Ankle replacement surgical scar noted.   Neurological:  No sensory deficit  Skin: She is not diaphoretic.  Skin intact, no tenting    ED Course  Procedures (including critical care time)  DIAGNOSTIC STUDIES: Oxygen Saturation is 99% on room air, normal by my interpretation.    COORDINATION OF CARE:   5:11 PM- Treatment plan concerning radiologic evaluation discussed with patient. Pt agrees with treatment.   7:43PM- Phone consulattion with Dr. Victorino Dike (Orthopedist). Pt condition as well as radiology results and pending laboratory evaluation discussed.  7:50PM- Recheck. Pending laboratory results and phone consultation with Dr. Victorino Dike discussed. Pt informs of a scheduled appointment with her orthopedic on Monday (07/09/12).  VASCULAR LAB  PRELIMINARY PRELIMINARY PRELIMINARY PRELIMINARY  Left lower extremity venous duplex completed.  Preliminary report: Left: No evidence of DVT, superficial thrombosis, or Baker's cyst.  CESTONE, HELENE   Results for orders placed during the hospital encounter of 07/04/12  CBC WITH DIFFERENTIAL      Result Value Range   WBC 6.8  4.0 - 10.5 K/uL   RBC 4.36  3.87 - 5.11 MIL/uL   Hemoglobin 12.3  12.0 - 15.0 g/dL   HCT 16.1 (*) 09.6  - 46.0 %   MCV 77.3 (*) 78.0 - 100.0 fL   MCH 28.2  26.0 - 34.0 pg   MCHC 36.5 (*) 30.0 - 36.0 g/dL   RDW 04.5  40.9 - 81.1 %   Platelets 245  150 - 400 K/uL   Neutrophils Relative 45  43 - 77 %   Neutro Abs 3.0  1.7 - 7.7 K/uL  Lymphocytes Relative 46  12 - 46 %   Lymphs Abs 3.1  0.7 - 4.0 K/uL   Monocytes Relative 8  3 - 12 %   Monocytes Absolute 0.5  0.1 - 1.0 K/uL   Eosinophils Relative 2  0 - 5 %   Eosinophils Absolute 0.1  0.0 - 0.7 K/uL   Basophils Relative 0  0 - 1 %   Basophils Absolute 0.0  0.0 - 0.1 K/uL  BASIC METABOLIC PANEL      Result Value Range   Sodium 136  135 - 145 mEq/L   Potassium 3.8  3.5 - 5.1 mEq/L   Chloride 103  96 - 112 mEq/L   CO2 25  19 - 32 mEq/L   Glucose, Bld 94  70 - 99 mg/dL   BUN 12  6 - 23 mg/dL   Creatinine, Ser 4.09  0.50 - 1.10 mg/dL   Calcium 9.0  8.4 - 81.1 mg/dL   GFR calc non Af Amer >90  >90 mL/min   GFR calc Af Amer >90  >90 mL/min  SEDIMENTATION RATE      Result Value Range   Sed Rate 27 (*) 0 - 22 mm/hr   Dg Ankle Complete Left  07/04/2012  *RADIOLOGY REPORT*  Clinical Data: Pain and swelling.  LEFT ANKLE COMPLETE - 3+ VIEW  Comparison: 12/03/2010  Findings: Changes of left ankle arthroplasty with tibial and talar components.  There is suggestion of mild lucency around both components.  There is diffuse soft tissue swelling.  Negative for fracture.  Small spurs dorsally in the midfoot and from the calcaneus.  IMPRESSION:  1.  Subtle lucency around the components of left ankle arthroplasty suggesting loosening or infection.   Original Report Authenticated By: D. Andria Rhein, MD    After above imaging reviewed, labs added and consult to pts orthopedic, Dr. Victorino Dike to see in ED.   Consult: Ankle aspiration to be done in ER. Will Admit for observation.    No diagnosis found.    MDM  Ankle pain ? Infection  Patient is a 47 year old female with history of multiple left ankle surgeries performed by Dr. Victorino Dike presents emergency  department complaining of left ankle pain with weightbearing and worsened swelling.  Imaging ordered and reviewed with vascular imaging showing no evidence of DVT but x-ray showing lucency suggesting possible infection.  Consult orthopedics as above.  Patient is to be admitted for observation and further evaluation and pending results of ankle aspiration.The patient appears reasonably stabilized for admission considering the current resources, flow, and capabilities available in the ED at this time, and I doubt any other [facility] requiring further screening and/or treatment in the ED prior to admission.       I personally performed the services described in this documentation, which was scribed in my presence. The recorded information has been reviewed and is accurate.      Jaci Carrel, [place] 07/04/12 2259

## 2012-07-04 NOTE — Progress Notes (Signed)
VASCULAR LAB PRELIMINARY  PRELIMINARY  PRELIMINARY  PRELIMINARY  Left lower extremity venous duplex  completed.    Preliminary report:  Left:  No evidence of DVT, superficial thrombosis, or Baker's cyst.  Jillisa Harris, RVT 07/04/2012, 5:39 PM

## 2012-07-04 NOTE — ED Notes (Signed)
Reports having hx of left ankle replacement 1.5 years ago, having severe pain for several days, denies new injury to ankle. Swelling noted, states that is normal for her. +pedal pulse.

## 2012-07-04 NOTE — H&P (Signed)
[name] is an 47 y.o. female.   Chief Complaint: left ankle pain HPI: 47 y/o female with acute onset of severe left ankle pain and swelling today.  Denies recent dental work or illness.  Pain is sharp and severe.  Worse with WB and better with rest.  Denies f/c/n/v.  Says she has been working a lot of hours.  Left total ankle replacement was done over a year ago.  Post op course has been unremarkable.  Past Medical History  Diagnosis Date  . Allergy   . Hypertension   . PONV (postoperative nausea and vomiting)     N/V and trouble urinating after surgery   . Chronic kidney disease     right renal cyst   . GERD (gastroesophageal reflux disease)     hx of no problems at present   . Headache     occasional headache   . Arthritis     Lt ankle    Past Surgical History  Procedure Laterality Date  . Breast surgery  2010    lumpectomy Lt breast  . Tubal ligation  1988  . Fracture surgery  2011    Lt ankle surgeries x 3   . Joint replacement      left ankle 8/12   . Other surgical history      2 hand surgeries on left and shoulder surgery on right shoulder   . Endometrial ablation  October 18 2010    at [facility]  . Laparoscopic nephrectomy  03/09/2011    Procedure: LAPAROSCOPIC NEPHRECTOMY;  Surgeon: Marcine Matar;  Location: [facility];  Service: Urology;  Laterality: Right;  Laparoscopic Assisted Removal Of Renal Cyst     Family History  Problem Relation Age of Onset  . Diabetes Mother   . Heart disease Mother   . Hypertension Father   . Diabetes Sister   . Kidney disease Sister   . Hypertension Brother   . Kidney disease Brother   . Arthritis Brother   . Cancer Maternal Grandmother   . Diabetes Sister   . Hypertension Sister   . Hypertension Sister   . Hypertension Sister   . Hypertension Brother   . Mental illness Brother    Social History:  reports that she has never smoked. She does not have any smokeless tobacco history on file. She reports that she does not  drink alcohol or use illicit drugs.  Allergies:  Allergies  Allergen Reactions  . Tetracycline Hcl Shortness Of Breath, Swelling and Other (See Comments)    Large purple bruise  . Septra (Bactrim) Itching, Swelling and Other (See Comments)    Purple bruise  . Sulfamethoxazole W-Trimethoprim Itching, Swelling and Other (See Comments)    Purple bruise     (Not in a hospital admission)  Results for orders placed during the hospital encounter of 07/04/12 (from the past 48 hour(s))  CBC WITH DIFFERENTIAL     Status: Abnormal   Collection Time    07/04/12  7:27 PM      Result Value Range   WBC 6.8  4.0 - 10.5 K/uL   RBC 4.36  3.87 - 5.11 MIL/uL   Hemoglobin 12.3  12.0 - 15.0 g/dL   HCT 96.0 (*) 45.4 - 09.8 %   MCV 77.3 (*) 78.0 - 100.0 fL   MCH 28.2  26.0 - 34.0 pg   MCHC 36.5 (*) 30.0 - 36.0 g/dL   RDW 11.9  14.7 - 82.9 %   Platelets 245  150 - 400 K/uL   Neutrophils Relative 45  43 - 77 %   Neutro Abs 3.0  1.7 - 7.7 K/uL   Lymphocytes Relative 46  12 - 46 %   Lymphs Abs 3.1  0.7 - 4.0 K/uL   Monocytes Relative 8  3 - 12 %   Monocytes Absolute 0.5  0.1 - 1.0 K/uL   Eosinophils Relative 2  0 - 5 %   Eosinophils Absolute 0.1  0.0 - 0.7 K/uL   Basophils Relative 0  0 - 1 %   Basophils Absolute 0.0  0.0 - 0.1 K/uL  BASIC METABOLIC PANEL     Status: None   Collection Time    07/04/12  7:27 PM      Result Value Range   Sodium 136  135 - 145 mEq/L   Potassium 3.8  3.5 - 5.1 mEq/L   Chloride 103  96 - 112 mEq/L   CO2 25  19 - 32 mEq/L   Glucose, Bld 94  70 - 99 mg/dL   BUN 12  6 - 23 mg/dL   Creatinine, Ser 1.61  0.50 - 1.10 mg/dL   Calcium 9.0  8.4 - 09.6 mg/dL   GFR calc non Af Amer >90  >90 mL/min   GFR calc Af Amer >90  >90 mL/min   Comment:            The eGFR has been calculated     using the CKD EPI equation.     This calculation has not been     validated in all clinical     situations.     eGFR's persistently     <90 mL/min signify     possible Chronic Kidney  Disease.  SEDIMENTATION RATE     Status: Abnormal   Collection Time    07/04/12  8:05 PM      Result Value Range   Sed Rate 27 (*) 0 - 22 mm/hr   Dg Ankle Complete Left  07/04/2012  *RADIOLOGY REPORT*  Clinical Data: Pain and swelling.  LEFT ANKLE COMPLETE - 3+ VIEW  Comparison: 12/03/2010  Findings: Changes of left ankle arthroplasty with tibial and talar components.  There is suggestion of mild lucency around both components.  There is diffuse soft tissue swelling.  Negative for fracture.  Small spurs dorsally in the midfoot and from the calcaneus.  IMPRESSION:  1.  Subtle lucency around the components of left ankle arthroplasty suggesting loosening or infection.   Original Report Authenticated By: D. Hassell III, MD     ROS  No recent f/c/n/v/wt loss  Blood pressure 138/83, pulse 58, temperature 98.4 F (36.9 C), temperature source Oral, resp. rate 18, SpO2 98.00%. Physical Exam  wn wd woman in nad.  A and O x 4.  Mood and affect normal.  EOMI.  Respirations unlabored.  L ankle with swelling and effusion.  Very ttp at anteromedial joint line.  Slight warmth.  5/5 strength in PF and DF of the ankle.  Sens to LT intact throughout the foot and ankle.  No lymphadenopathy or cellulitis. Assessment/Plan Left ankle swelling and pain - given the patient's elevated ESR, I believe it's necessary to aspirate the ankle joint.  She understands the plan and agrees.  PRocedure:  After informed consent and sterile prep, I aspirated approx 1 cc of clear blood tinged synovial fluid from the ankle joint through an anteromedial aspiration site.  She tolerated the procedure well with no evident complications.  Tonight I'll observe the pt on 5N while awaiting results of the cell count.  If the cell count is elevated she'll require removal of the implant as well as I and D and placement of an antibiotic spacer.  She understands the plan and agrees.  Toni Arthurs 07/04/2012, 10:44 PM

## 2012-07-04 NOTE — ED Notes (Signed)
Victorino Dike, MD at bedside attempting to aspirate fluid from L ankle, pt tolerated procedure well, fluid collection complete with sample to be walked to main lab for analysis

## 2012-07-05 ENCOUNTER — Encounter (HOSPITAL_COMMUNITY): Payer: Self-pay | Admitting: *Deleted

## 2012-07-05 ENCOUNTER — Observation Stay (HOSPITAL_COMMUNITY): Payer: BC Managed Care – PPO | Admitting: Anesthesiology

## 2012-07-05 ENCOUNTER — Encounter (HOSPITAL_COMMUNITY)
Admission: EM | Disposition: A | Discharge status: Home / Self Care | Payer: Self-pay | Source: Home / Self Care | Attending: Orthopedic Surgery

## 2012-07-05 ENCOUNTER — Encounter (HOSPITAL_COMMUNITY): Payer: Self-pay | Admitting: Anesthesiology

## 2012-07-05 HISTORY — PX: TOTAL ANKLE ARTHROPLASTY: SHX811

## 2012-07-05 LAB — CREATININE, SERUM
GFR calc Af Amer: >90 mL/min (ref >90)
GFR calc non Af Amer: >90 mL/min (ref >90)

## 2012-07-05 LAB — C-REACTIVE PROTEIN: CRP: <0.5 mg/dL — ABNORMAL LOW (ref <0.60)

## 2012-07-05 LAB — CBC
HCT: 34.6 % — ABNORMAL LOW (ref 36.0–46.0)
Hemoglobin: 12.6 g/dL (ref 12.0–15.0)
MCHC: 36.4 g/dL — ABNORMAL HIGH (ref 30.0–36.0)
MCV: 78.3 fL (ref 78.0–100.0)
WBC: 9.9 10*3/uL (ref 4.0–10.5)

## 2012-07-05 SURGERY — ARTHROPLASTY, ANKLE, TOTAL
Anesthesia: General | Laterality: Left | Wound class: Dirty or Infected

## 2012-07-05 MED ORDER — SENNA 8.6 MG PO TABS
2.0000 | ORAL_TABLET | Freq: Two times a day (BID) | ORAL | Status: DC
  Administered 2012-07-05 – 2012-07-08 (×7): 17.2 mg via ORAL
  Filled 2012-07-05 (×9): qty 2

## 2012-07-05 MED ORDER — DEXAMETHASONE SODIUM PHOSPHATE 4 MG/ML IJ SOLN
INTRAMUSCULAR | Status: DC | PRN
  Administered 2012-07-05: 8 mg via INTRAVENOUS

## 2012-07-05 MED ORDER — VANCOMYCIN HCL IN DEXTROSE 1-5 GM/200ML-% IV SOLN
1000.0000 mg | Freq: Three times a day (TID) | INTRAVENOUS | Status: DC
  Administered 2012-07-06 – 2012-07-09 (×11): 1000 mg via INTRAVENOUS
  Filled 2012-07-05 (×13): qty 200

## 2012-07-05 MED ORDER — ONDANSETRON HCL 4 MG/2ML IJ SOLN
INTRAMUSCULAR | Status: DC | PRN
  Administered 2012-07-05: 4 mg via INTRAVENOUS

## 2012-07-05 MED ORDER — DOCUSATE SODIUM 100 MG PO CAPS
100.0000 mg | ORAL_CAPSULE | Freq: Two times a day (BID) | ORAL | Status: DC
  Administered 2012-07-05 – 2012-07-09 (×8): 100 mg via ORAL
  Filled 2012-07-05 (×7): qty 1

## 2012-07-05 MED ORDER — PIPERACILLIN-TAZOBACTAM 3.375 G IVPB
3.3750 g | Freq: Three times a day (TID) | INTRAVENOUS | Status: DC
  Administered 2012-07-05 – 2012-07-09 (×11): 3.375 g via INTRAVENOUS
  Filled 2012-07-05 (×14): qty 50

## 2012-07-05 MED ORDER — HYDROMORPHONE HCL PF 1 MG/ML IJ SOLN
0.2500 mg | INTRAMUSCULAR | Status: DC | PRN
  Administered 2012-07-05: 0.5 mg via INTRAVENOUS

## 2012-07-05 MED ORDER — LACTATED RINGERS IV SOLN
INTRAVENOUS | Status: DC
  Administered 2012-07-05: 17:00:00 via INTRAVENOUS

## 2012-07-05 MED ORDER — ENOXAPARIN SODIUM 40 MG/0.4ML ~~LOC~~ SOLN
40.0000 mg | SUBCUTANEOUS | Status: DC
  Administered 2012-07-06 – 2012-07-09 (×4): 40 mg via SUBCUTANEOUS
  Filled 2012-07-05 (×5): qty 0.4

## 2012-07-05 MED ORDER — ONDANSETRON HCL 4 MG/2ML IJ SOLN
4.0000 mg | Freq: Four times a day (QID) | INTRAMUSCULAR | Status: DC | PRN
  Administered 2012-07-06: 4 mg via INTRAVENOUS
  Filled 2012-07-05: qty 2

## 2012-07-05 MED ORDER — VANCOMYCIN HCL IN DEXTROSE 1-5 GM/200ML-% IV SOLN: 1000.0000 mg | Freq: Once | INTRAVENOUS | Status: DC

## 2012-07-05 MED ORDER — BUPIVACAINE HCL 0.25 % IJ SOLN
INTRAMUSCULAR | Status: DC | PRN
  Administered 2012-07-05: 22 mL

## 2012-07-05 MED ORDER — BUPIVACAINE HCL (PF) 0.25 % IJ SOLN
INTRAMUSCULAR | Status: AC
  Filled 2012-07-05: qty 30

## 2012-07-05 MED ORDER — SCOPOLAMINE 1 MG/3DAYS TD PT72
MEDICATED_PATCH | TRANSDERMAL | Status: AC
  Filled 2012-07-05: qty 1

## 2012-07-05 MED ORDER — CHLORHEXIDINE GLUCONATE 4 % EX LIQD
60.0000 mL | Freq: Once | CUTANEOUS | Status: AC
  Administered 2012-07-05: 4 via TOPICAL
  Filled 2012-07-05: qty 60

## 2012-07-05 MED ORDER — VANCOMYCIN HCL IN DEXTROSE 1-5 GM/200ML-% IV SOLN
INTRAVENOUS | Status: AC
  Filled 2012-07-05: qty 200

## 2012-07-05 MED ORDER — 0.9 % SODIUM CHLORIDE (POUR BTL) OPTIME
TOPICAL | Status: DC | PRN
  Administered 2012-07-05: 1000 mL

## 2012-07-05 MED ORDER — BACITRACIN ZINC 500 UNIT/GM EX OINT
TOPICAL_OINTMENT | CUTANEOUS | Status: DC | PRN
  Administered 2012-07-05: 1 via TOPICAL

## 2012-07-05 MED ORDER — METOCLOPRAMIDE HCL 10 MG PO TABS: 5.0000 mg | ORAL_TABLET | Freq: Three times a day (TID) | ORAL | Status: DC | PRN

## 2012-07-05 MED ORDER — OXYCODONE HCL 5 MG PO TABS: 5.0000 mg | ORAL_TABLET | Freq: Once | ORAL | Status: DC | PRN

## 2012-07-05 MED ORDER — HYDROMORPHONE HCL PF 1 MG/ML IJ SOLN
0.5000 mg | INTRAMUSCULAR | Status: DC | PRN
  Administered 2012-07-06 – 2012-07-07 (×6): 1 mg via INTRAVENOUS
  Filled 2012-07-05 (×6): qty 1

## 2012-07-05 MED ORDER — SODIUM CHLORIDE 0.9 % IR SOLN
Status: DC | PRN
  Administered 2012-07-05: 9000 mL

## 2012-07-05 MED ORDER — FENTANYL CITRATE 0.05 MG/ML IJ SOLN
INTRAMUSCULAR | Status: DC | PRN
  Administered 2012-07-05: 25 ug via INTRAVENOUS
  Administered 2012-07-05: 50 ug via INTRAVENOUS
  Administered 2012-07-05: 100 ug via INTRAVENOUS
  Administered 2012-07-05 (×4): 50 ug via INTRAVENOUS
  Administered 2012-07-05: 25 ug via INTRAVENOUS

## 2012-07-05 MED ORDER — BUPIVACAINE HCL (PF) 0.25 % IJ SOLN
INTRAMUSCULAR | Status: AC
  Filled 2012-07-05: qty 10

## 2012-07-05 MED ORDER — LIDOCAINE HCL (CARDIAC) 20 MG/ML IV SOLN
INTRAVENOUS | Status: DC | PRN
  Administered 2012-07-05: 40 mg via INTRAVENOUS

## 2012-07-05 MED ORDER — PROPOFOL 10 MG/ML IV BOLUS
INTRAVENOUS | Status: DC | PRN
  Administered 2012-07-05: 200 mg via INTRAVENOUS

## 2012-07-05 MED ORDER — BACITRACIN ZINC 500 UNIT/GM EX OINT
TOPICAL_OINTMENT | CUTANEOUS | Status: AC
  Filled 2012-07-05: qty 15

## 2012-07-05 MED ORDER — OXYCODONE HCL 5 MG/5ML PO SOLN: 5.0000 mg | Freq: Once | ORAL | Status: DC | PRN

## 2012-07-05 MED ORDER — ONDANSETRON HCL 4 MG PO TABS: 4.0000 mg | ORAL_TABLET | Freq: Four times a day (QID) | ORAL | Status: DC | PRN

## 2012-07-05 MED ORDER — HYDROMORPHONE HCL PF 1 MG/ML IJ SOLN
INTRAMUSCULAR | Status: AC
  Administered 2012-07-05: 0.5 mg via INTRAVENOUS
  Filled 2012-07-05: qty 1

## 2012-07-05 MED ORDER — SODIUM CHLORIDE 0.9 % IV SOLN
INTRAVENOUS | Status: DC
  Administered 2012-07-05: 100 mL/h via INTRAVENOUS
  Administered 2012-07-06 – 2012-07-07 (×3): via INTRAVENOUS

## 2012-07-05 MED ORDER — LACTATED RINGERS IV SOLN
INTRAVENOUS | Status: DC | PRN
  Administered 2012-07-05 (×2): via INTRAVENOUS

## 2012-07-05 MED ORDER — SCOPOLAMINE 1 MG/3DAYS TD PT72
1.0000 | MEDICATED_PATCH | Freq: Once | TRANSDERMAL | Status: AC
  Administered 2012-07-05: 1.5 mg via TRANSDERMAL

## 2012-07-05 MED ORDER — MIDAZOLAM HCL 5 MG/5ML IJ SOLN
INTRAMUSCULAR | Status: DC | PRN
  Administered 2012-07-05: 2 mg via INTRAVENOUS

## 2012-07-05 MED ORDER — METOCLOPRAMIDE HCL 5 MG/ML IJ SOLN
5.0000 mg | Freq: Three times a day (TID) | INTRAMUSCULAR | Status: DC | PRN
  Filled 2012-07-05: qty 2

## 2012-07-05 MED ORDER — VANCOMYCIN HCL 1000 MG IV SOLR
1000.0000 mg | INTRAVENOUS | Status: DC | PRN
  Administered 2012-07-05: 1000 mg via INTRAVENOUS

## 2012-07-05 MED ORDER — PROMETHAZINE HCL 25 MG/ML IJ SOLN: 6.2500 mg | INTRAMUSCULAR | Status: DC | PRN

## 2012-07-05 SURGICAL SUPPLY — 56 items
BANDAGE ESMARK 6X9 LF (GAUZE/BANDAGES/DRESSINGS) ×1 IMPLANT
BLADE LONG MED 31X9 (MISCELLANEOUS) ×1 IMPLANT
BLADE RECIPRO TAPERED (BLADE) ×2 IMPLANT
BLADE SAGITTAL (BLADE)
BLADE SAW THK.89X75X18XSGTL (BLADE) ×1 IMPLANT
BLADE SURG 15 STRL LF DISP TIS (BLADE) ×2 IMPLANT
BLADE SURG 15 STRL SS (BLADE) ×4
BNDG CMPR 9X6 STRL LF SNTH (GAUZE/BANDAGES/DRESSINGS) ×1
BNDG ESMARK 6X9 LF (GAUZE/BANDAGES/DRESSINGS) ×2
CHLORAPREP W/TINT 26ML (MISCELLANEOUS) ×2 IMPLANT
CLOTH BEACON ORANGE TIMEOUT ST (SAFETY) ×2 IMPLANT
CORE SLIDING STAR SZ 8MM (Orthopedic Implant) ×2 IMPLANT
COVER SURGICAL LIGHT HANDLE (MISCELLANEOUS) ×2 IMPLANT
CUFF TOURNIQUET SINGLE 34IN LL (TOURNIQUET CUFF) ×2 IMPLANT
CUFF TOURNIQUET SINGLE 44IN (TOURNIQUET CUFF) IMPLANT
DRAPE C-ARM 42X72 X-RAY (DRAPES) ×1 IMPLANT
DRAPE C-ARMOR (DRAPES) ×1 IMPLANT
DRAPE EXTREMITY T 121X128X90 (DRAPE) ×2 IMPLANT
DRAPE ORTHO SPLIT 77X108 STRL (DRAPES) ×2
DRAPE SURG ORHT 6 SPLT 77X108 (DRAPES) ×1 IMPLANT
DRAPE U-SHAPE 47X51 STRL (DRAPES) ×2 IMPLANT
DRSG ADAPTIC 3X8 NADH LF (GAUZE/BANDAGES/DRESSINGS) ×2 IMPLANT
DRSG MEPILEX BORDER 4X4 (GAUZE/BANDAGES/DRESSINGS) ×2 IMPLANT
DRSG PAD ABDOMINAL 8X10 ST (GAUZE/BANDAGES/DRESSINGS) ×6 IMPLANT
ELECT REM PT RETURN 9FT ADLT (ELECTROSURGICAL) ×2
ELECTRODE REM PT RTRN 9FT ADLT (ELECTROSURGICAL) ×1 IMPLANT
EVACUATOR 1/8 PVC DRAIN (DRAIN) ×2 IMPLANT
FLUID NSS /IRRIG 3000 ML XXX (IV SOLUTION) ×6 IMPLANT
GLOVE BIO SURGEON STRL SZ8 (GLOVE) ×2 IMPLANT
GLOVE BIOGEL PI IND STRL 6.5 (GLOVE) IMPLANT
GLOVE BIOGEL PI IND STRL 8 (GLOVE) ×1 IMPLANT
GLOVE BIOGEL PI INDICATOR 6.5 (GLOVE) ×1
GLOVE BIOGEL PI INDICATOR 8 (GLOVE) ×1
GLOVE ORTHO TXT STRL SZ7.5 (GLOVE) ×2 IMPLANT
GOWN PREVENTION PLUS XLARGE (GOWN DISPOSABLE) ×2 IMPLANT
GOWN STRL NON-REIN LRG LVL3 (GOWN DISPOSABLE) ×4 IMPLANT
KIT BASIN OR (CUSTOM PROCEDURE TRAY) ×2 IMPLANT
KIT ROOM TURNOVER OR (KITS) ×2 IMPLANT
MANIFOLD NEPTUNE II (INSTRUMENTS) ×2 IMPLANT
NS IRRIG 1000ML POUR BTL (IV SOLUTION) ×2 IMPLANT
PACK TOTAL JOINT (CUSTOM PROCEDURE TRAY) ×2 IMPLANT
PAD ARMBOARD 7.5X6 YLW CONV (MISCELLANEOUS) ×4 IMPLANT
PAD CAST 4YDX4 CTTN HI CHSV (CAST SUPPLIES) ×3 IMPLANT
PADDING CAST COTTON 4X4 STRL (CAST SUPPLIES) ×6
PADDING CAST COTTON 6X4 STRL (CAST SUPPLIES) ×2 IMPLANT
SPONGE GAUZE 4X4 12PLY (GAUZE/BANDAGES/DRESSINGS) ×3 IMPLANT
SUCTION FRAZIER TIP 10 FR DISP (SUCTIONS) ×2 IMPLANT
SUT ETHILON 2 0 PSLX (SUTURE) ×1 IMPLANT
SUT MNCRL AB 3-0 PS2 18 (SUTURE) ×1 IMPLANT
SUT PDS AB 2-0 CT1 27 (SUTURE) ×2 IMPLANT
SUT PROLENE 3 0 PS 2 (SUTURE) IMPLANT
SUT VIC AB 0 CT1 27 (SUTURE)
SUT VIC AB 0 CT1 27XBRD ANBCTR (SUTURE) ×1 IMPLANT
TOWEL OR 17X24 6PK STRL BLUE (TOWEL DISPOSABLE) ×2 IMPLANT
TOWEL OR 17X26 10 PK STRL BLUE (TOWEL DISPOSABLE) ×2 IMPLANT
WATER STERILE IRR 1000ML POUR (IV SOLUTION) ×2 IMPLANT

## 2012-07-05 NOTE — Anesthesia Postprocedure Evaluation (Signed)
  Anesthesia Post-op Note  Patient: [name]  Procedure(s) Performed: Procedure(s): Removal of total ankle implants,I & D with insertion of  new poly spacer (Left)  Patient Location: PACU  Anesthesia Type:General  Level of Consciousness: awake  Airway and Oxygen Therapy: Patient Spontanous Breathing  Post-op Pain: mild  Post-op Assessment: Post-op Vital signs reviewed  Post-op Vital Signs: Reviewed  Complications: No apparent anesthesia complications

## 2012-07-05 NOTE — Progress Notes (Signed)
Subjective: Ms. Czerwinski c/o continued pain and warmth at the left ankle overnight.  Denies f/c/n/v.    Objective: Vital signs in last 24 hours: Temp:  [98 F (36.7 C)-98.6 F (37 C)] 98 F (36.7 C) 08-01-22 1539) Pulse Rate:  [51-82] 56 August 01, 2022 1539) Resp:  [18-20] 18 08/01/22 1539) BP: (136-147)/(50-83) 138/71 mmHg 2022/08/01 1539) SpO2:  [98 %-100 %] 100 % 08/01/22 1539) Weight:  [97.977 kg (216 lb)] 97.977 kg (216 lb) 2022-08-01 0020)  Intake/Output from previous day: 03/05 0701 - 2022-08-01 0700 In: -  Out: 650 [Urine:650] Intake/Output this shift: Total I/O In: 0  Out: 1000 [Urine:1000]   Recent Labs  07/04/12 1927  HGB 12.3    Recent Labs  07/04/12 1927  WBC 6.8  RBC 4.36  HCT 33.7*  PLT 245    Recent Labs  07/04/12 1927  NA 136  K 3.8  CL 103  CO2 25  BUN 12  CREATININE 0.74  GLUCOSE 94  CALCIUM 9.0   Cell count was > 7k WBCs with elevated neutraphils on diff.  L ankle swollen and warm with effusion.  Sens to LT intact.  5/5 strength in PF and DF of the ankle.  Assessment/Plan: L ankle periprosthetic infection - given how recent the infection apparently started, I believe it's worth trying to preserve the implant by washing out the joint and replacing the poly.  I've explained the risks and benefits of this approach to the infection to the patient in detail.  She agrees and understands that she may still have to have the implant removed in its entirety.  The risks and benefits of the alternative treatment options have been discussed in detail.  The patient wishes to proceed with surgery and specifically understands risks of bleeding, infection, nerve damage, blood clots, need for additional surgery, amputation and death.    Toni Arthurs 07-31-12, 4:41 PM

## 2012-07-05 NOTE — Transfer of Care (Signed)
Immediate Anesthesia Transfer of Care Note  Patient: [name]  Procedure(s) Performed: Procedure(s): Removal of total ankle implants,I & D with insertion of  new poly spacer (Left)  Patient Location: PACU  Anesthesia Type:General  Level of Consciousness: awake, alert  and oriented  Airway & Oxygen Therapy: Patient Spontanous Breathing and Patient connected to nasal cannula oxygen  Post-op Assessment: Report given to PACU RN, Post -op Vital signs reviewed and stable and Patient moving all extremities  Post vital signs: Reviewed and stable  Complications: No apparent anesthesia complications

## 2012-07-05 NOTE — Anesthesia Preprocedure Evaluation (Signed)
Anesthesia Evaluation    History of Anesthesia Complications (+) PONV  Airway       Dental   Pulmonary neg pulmonary ROS,          Cardiovascular hypertension, Pt. on medications     Neuro/Psych  Headaches,    GI/Hepatic Neg liver ROS, GERD-  ,  Endo/Other  negative endocrine ROS  Renal/GU Renal InsufficiencyRenal disease     Musculoskeletal   Abdominal   Peds  Hematology   Anesthesia Other Findings   Reproductive/Obstetrics                           Anesthesia Physical Anesthesia Plan  ASA: II  Anesthesia Plan: General   Post-op Pain Management:    Induction:   Airway Management Planned: LMA  Additional Equipment:   Intra-op Plan:   Post-operative Plan: Extubation in OR  Informed Consent:   Plan Discussed with: CRNA, Anesthesiologist and Surgeon  Anesthesia Plan Comments:         Anesthesia Quick Evaluation

## 2012-07-05 NOTE — Preoperative (Signed)
Beta Blockers   Reason not to administer Beta Blockers:Not Applicable 

## 2012-07-05 NOTE — Brief Op Note (Signed)
07/04/2012 - 07/05/2012  6:38 PM  PATIENT:  [name]  47 y.o. female  PRE-OPERATIVE DIAGNOSIS:  Left ankle periprosthetic infection  POST-OPERATIVE DIAGNOSIS:  Same  Procedure(s): 1.  Left ankle removal of poly spacer (8mm) 2.  Irrigation and debridement of left ankle joint   SURGEON:  Toni Arthurs, MD  ASSISTANT: n/a  ANESTHESIA:   General  EBL:  minimal   TOURNIQUET:   Total Tourniquet Time Documented: Thigh (Left) - 78 minutes Total: Thigh (Left) - 78 minutes   COMPLICATIONS:  None apparent  DISPOSITION:  Extubated, awake and stable to recovery.  DICTATION ID:  [id number]

## 2012-07-05 NOTE — Progress Notes (Signed)
ANTIBIOTIC CONSULT NOTE - INITIAL  Pharmacy Consult for Vancomycin and Zosyn Indication: left ankle infection  Allergies  Allergen Reactions  . Tetracycline Hcl Shortness Of Breath, Swelling and Other (See Comments)    Large purple bruise  . Septra (Bactrim) Itching, Swelling and Other (See Comments)    Purple bruise  . Sulfamethoxazole W-Trimethoprim Itching, Swelling and Other (See Comments)    Purple bruise    Patient Measurements: Height: 5' 7.5" (171.5 cm) Weight: 216 lb (97.977 kg) IBW/kg (Calculated) : 62.75  Vital Signs: Temp: 98.2 F (36.8 C) (03/06 2041) Temp src: Oral (03/06 1335) BP: 137/72 mmHg (03/06 2041) Pulse Rate: 56 (03/06 2041) Intake/Output from previous day: 03/05 0701 - 03/06 0700 In: -  Out: 650 [Urine:650] Intake/Output from this shift: Total I/O In: 375 [P.O.:75; I.V.:300] Out: -   Labs:  Recent Labs  07/04/12 1927  WBC 6.8  HGB 12.3  PLT 245  CREATININE 0.74   Estimated Creatinine Clearance: 104.4 ml/min (by C-G formula based on Cr of 0.74).    Microbiology: Recent Results (from the past 720 hour(s))  BODY FLUID CULTURE     Status: None   Collection Time    07/04/12 10:50 PM      Result Value Range Status   Specimen Description SYNOVIAL ANKLE LEFT   Final   Special Requests NONE   Final   Gram Stain     Final   Value: NO WBC SEEN     NO ORGANISMS SEEN   Culture PENDING   Incomplete   Report Status PENDING   Incomplete  SURGICAL PCR SCREEN     Status: None   Collection Time    07/05/12  2:30 AM      Result Value Range Status   MRSA, PCR NEGATIVE  NEGATIVE Final   Staphylococcus aureus NEGATIVE  NEGATIVE Final   Comment:            The Xpert SA Assay (FDA     approved for NASAL specimens     in patients over 43 years of age),     is one component of     a comprehensive surveillance     program.  Test performance has     been validated by [employer] for patients greater     than or equal to 98 year old.     It  is not intended     to diagnose infection nor to     guide or monitor treatment.    Medical History: Past Medical History  Diagnosis Date  . Allergy   . Hypertension   . PONV (postoperative nausea and vomiting)     N/V and trouble urinating after surgery   . Chronic kidney disease     right renal cyst   . GERD (gastroesophageal reflux disease)     hx of no problems at present   . Headache     occasional headache   . Arthritis     Lt ankle    Medications:  Prescriptions prior to admission  Medication Sig Dispense Refill  . aspirin EC 81 MG tablet Take 81 mg by mouth daily.      Marland Kitchen losartan (COZAAR) 50 MG tablet Take 50 mg by mouth daily.      Marland Kitchen spironolactone (ALDACTONE) 25 MG tablet Take 1 tablet (25 mg total) by mouth daily.  90 tablet  3  . vitamin E 400 UNIT capsule Take 400 Units by mouth daily.       Marland Kitchen  zolpidem (AMBIEN) 10 MG tablet Take 5 mg by mouth at bedtime as needed. For sleep       Assessment: 47 yo F with acute onset of L ankle pain and swelling on 07/04/12.  Pt has a hx of L total ankle replacement 1 year ago.  Now s/p OR 07/05/12 for I&D of L ankle joint and placement of antibiotic spacer.  Pt received Vancomycin 1gm IV preop ~ 1800 today.  Goal of Therapy:  Vancomycin trough level 15-20 mcg/ml  Plan:  Vancomycin 1gm IV Q8h - next dose 07/06/12 at 0200. Zosyn 3.375 gm IV q8h (4 hour infusion). Will follow-up culture data, renal function, and clinical progress. Vancomycin trough at steady state.  [employer], Pharm.D., BCPS Clinical Pharmacist Pager [phone] 07/05/2012 9:08 PM

## 2012-07-06 DIAGNOSIS — T8450XA Infection and inflammatory reaction due to unspecified internal joint prosthesis, initial encounter: Secondary | ICD-10-CM

## 2012-07-06 DIAGNOSIS — Y831 Surgical operation with implant of artificial internal device as the cause of abnormal reaction of the patient, or of later complication, without mention of misadventure at the time of the procedure: Secondary | ICD-10-CM

## 2012-07-06 MED ORDER — SODIUM CHLORIDE 0.9 % IV SOLN: INTRAVENOUS | Status: DC

## 2012-07-06 MED ORDER — WHITE PETROLATUM GEL
Status: AC
  Administered 2012-07-06: 0.2
  Filled 2012-07-06: qty 5

## 2012-07-06 NOTE — ED Provider Notes (Signed)
Medical screening examination/treatment/procedure(s) were performed by non-physician practitioner and as supervising physician I was immediately available for consultation/collaboration.   Joya Gaskins, MD 07/06/12 [phone]

## 2012-07-06 NOTE — Progress Notes (Signed)
Subjective: 1 Day Post-Op Procedure(s) (LRB): Removal of total ankle implants,I & D with insertion of  new poly spacer (Left) Patient reports pain as mild.  Well controlled with oral pain meds.  No f/c/n/v.  Objective: Vital signs in last 24 hours: Temp:  [97.6 F (36.4 C)-98.5 F (36.9 C)] 97.9 F (36.6 C) (03/07 0945) Pulse Rate:  [51-65] 58 (03/07 0945) Resp:  [13-20] 20 (03/07 0945) BP: (100-157)/(50-77) 132/62 mmHg (03/07 0945) SpO2:  [94 %-100 %] 99 % (03/07 0945)  Intake/Output from previous day: 03/06 0701 - 03/07 0700 In: 1875 [P.O.:75; I.V.:1600; IV Piggyback:200] Out: 2000 [Urine:1950; Blood:50] Intake/Output this shift:     Recent Labs  07/04/12 1927 07/05/12 2124  HGB 12.3 12.6    Recent Labs  07/04/12 1927 07/05/12 2124  WBC 6.8 9.9  RBC 4.36 4.42  HCT 33.7* 34.6*  PLT 245 257    Recent Labs  07/04/12 1927 07/05/12 2124  NA 136  --   K 3.8  --   CL 103  --   CO2 25  --   BUN 12  --   CREATININE 0.74 0.70  GLUCOSE 94  --   CALCIUM 9.0  --    No results found for this basename: LABPT, INR,  in the last 72 hours  L LE splinted.  Wiggles toes and feels LT in deep peroneal nerve dist.  Brisk cap refill at toes.  Assessment/Plan: 1 Day Post-Op Procedure(s) (LRB): Removal of total ankle implants,I & D with insertion of  new poly spacer (Left) Up with therapy  NWB on L LE.  ID consult pending.  Toni Arthurs 07/06/2012, 12:45 PM

## 2012-07-06 NOTE — Consult Note (Signed)
[facility]  Total days of antibiotics 2        Day 2 vanco (2nd dose)        Day 2 piptazo ( 2 doses received)               Reason for Consult:periprosthetic ankle infection    Referring Physician: hewitt  Active Problems:   * No active hospital problems. *    HPI: [name] is a 47 y.o. female HTN, hx of nephrectomy, as well as  history of prior ankle fracture surgery in 2011, and underwent L ankle joint replacement in Aug 2012. She presented to ED on 3/5 with acute onset of left ankle and calf pain that is worse with weight bearing but improved at rest. She noticed that her right ankle would throb with pain at the end of the day, and swelling was not improved back to baseline over the last week. She reports working an additional 38 hrs of overtime over the last 2 wks as she works as a Glass blower/designer.  She denies any recent trauma to her ankle, no recent dental work. She denies any overt fevers but had a week of chills, nightsweats. In ED she was afebrile, normal WBC, slightly elevated sed rate of 26, crp <0.5. She had doppler negative for DVT. Underwent joint aspirate which was concerning for infection due to 7,056 WBC with 90%N. Gram stain showed rare WBC and no organism. She went to OR on 3/6 for removal of poly spacer, I X D, and antibiotic spacer. Full OR note still pending. She was started on vancomycin and piptazo empirically. She states that her ankle pain is improved sine having surgery.  Past Medical History  Diagnosis Date  . Allergy   . Hypertension   . PONV (postoperative nausea and vomiting)     N/V and trouble urinating after surgery   . Chronic kidney disease     right renal cyst   . GERD (gastroesophageal reflux disease)     hx of no problems at present   . Headache     occasional headache   . Arthritis     Lt ankle    Allergies:  Allergies  Allergen Reactions  . Tetracycline Hcl Shortness Of Breath, Swelling and Other (See Comments)      Large purple bruise  . Septra (Bactrim) Itching, Swelling and Other (See Comments)    Purple bruise  . Sulfamethoxazole W-Trimethoprim Itching, Swelling and Other (See Comments)    Purple bruise   - she reports hives with sulfa and tetra, no angioedema or shortness of breath  MEDICATIONS: . docusate sodium  100 mg Oral BID  . enoxaparin (LOVENOX) injection  40 mg Subcutaneous Q24H  . losartan  50 mg Oral Daily  . piperacillin-tazobactam (ZOSYN)  IV  3.375 g Intravenous Q8H  . scopolamine  1 patch Transdermal Once  . senna  2 tablet Oral BID  . spironolactone  25 mg Oral Daily  . vancomycin  1,000 mg Intravenous Q8H    History  Substance Use Topics  . Smoking status: Never Smoker   . Smokeless tobacco: Not on file  . Alcohol Use: No    Family History  Problem Relation Age of Onset  . Diabetes Mother   . Heart disease Mother   . Hypertension Father   . Diabetes Sister   . Kidney disease Sister   . Hypertension Brother   . Kidney disease Brother   .  Arthritis Brother   . Cancer Maternal Grandmother   . Diabetes Sister   . Hypertension Sister   . Hypertension Sister   . Hypertension Sister   . Hypertension Brother   . Mental illness Brother      Review of Systems  Constitutional: Negative for fever, chills, diaphoresis, activity change, appetite change, fatigue and unexpected weight change.  HENT: Negative for congestion, sore throat, rhinorrhea, sneezing, trouble swallowing and sinus pressure.  Eyes: Negative for photophobia and visual disturbance.  Respiratory: Negative for cough, chest tightness, shortness of breath, wheezing and stridor.  Cardiovascular: Negative for chest pain, palpitations and leg swelling.  Gastrointestinal: Negative for nausea, vomiting, abdominal pain, diarrhea, constipation, blood in stool, abdominal distention and anal bleeding.  Genitourinary: Negative for dysuria, hematuria, flank pain and difficulty urinating.  Musculoskeletal: per  hpi Skin: Negative for color change, pallor, rash and wound.  Neurological: Negative for dizziness, tremors, weakness and light-headedness.  Hematological: Negative for adenopathy. Does not bruise/bleed easily.  Psychiatric/Behavioral: Negative for behavioral problems, confusion, sleep disturbance, dysphoric mood, decreased concentration and agitation.     OBJECTIVE: Temp:  [97.6 F (36.4 C)-98.5 F (36.9 C)] 97.9 F (36.6 C) (03/07 0945) Pulse Rate:  [51-65] 58 (03/07 0945) Resp:  [13-20] 20 (03/07 0945) BP: (100-157)/(50-77) 132/62 mmHg (03/07 0945) SpO2:  [94 %-100 %] 99 % (03/07 0945) Physical Exam  Constitutional:  oriented to person, place, and time.  appears well-developed and well-nourished. No distress.  HENT:  Mouth/Throat: Oropharynx is clear and moist. No oropharyngeal exudate.  Cardiovascular: Normal rate, regular rhythm and normal heart sounds. Exam reveals no gallop and no friction rub.  No murmur heard.  Pulmonary/Chest: Effort normal and breath sounds normal. No respiratory distress. He has no wheezes.  Abdominal: Soft. Bowel sounds are normal.  exhibits no distension. There is no tenderness.  Lymphadenopathy: no cervical adenopathy.  Ext: left ankle wrapped in soft cast from foot- just below the knee Skin: Skin is warm and dry. No rash noted. No erythema.     LABS: Results for orders placed during the hospital encounter of 07/04/12 (from the past 48 hour(s))  CBC WITH DIFFERENTIAL     Status: Abnormal   Collection Time    07/04/12  7:27 PM      Result Value Range   WBC 6.8  4.0 - 10.5 K/uL   RBC 4.36  3.87 - 5.11 MIL/uL   Hemoglobin 12.3  12.0 - 15.0 g/dL   HCT 40.9 (*) 81.1 - 91.4 %   MCV 77.3 (*) 78.0 - 100.0 fL   MCH 28.2  26.0 - 34.0 pg   MCHC 36.5 (*) 30.0 - 36.0 g/dL   RDW 78.2  95.6 - 21.3 %   Platelets 245  150 - 400 K/uL   Neutrophils Relative 45  43 - 77 %   Neutro Abs 3.0  1.7 - 7.7 K/uL   Lymphocytes Relative 46  12 - 46 %   Lymphs Abs  3.1  0.7 - 4.0 K/uL   Monocytes Relative 8  3 - 12 %   Monocytes Absolute 0.5  0.1 - 1.0 K/uL   Eosinophils Relative 2  0 - 5 %   Eosinophils Absolute 0.1  0.0 - 0.7 K/uL   Basophils Relative 0  0 - 1 %   Basophils Absolute 0.0  0.0 - 0.1 K/uL  BASIC METABOLIC PANEL     Status: None   Collection Time    07/04/12  7:27 PM  Result Value Range   Sodium 136  135 - 145 mEq/L   Potassium 3.8  3.5 - 5.1 mEq/L   Chloride 103  96 - 112 mEq/L   CO2 25  19 - 32 mEq/L   Glucose, Bld 94  70 - 99 mg/dL   BUN 12  6 - 23 mg/dL   Creatinine, Ser 1.19  0.50 - 1.10 mg/dL   Calcium 9.0  8.4 - 14.7 mg/dL   GFR calc non Af Amer >90  >90 mL/min   GFR calc Af Amer >90  >90 mL/min   Comment:            The eGFR has been calculated     using the CKD EPI equation.     This calculation has not been     validated in all clinical     situations.     eGFR's persistently     <90 mL/min signify     possible Chronic Kidney Disease.  SEDIMENTATION RATE     Status: Abnormal   Collection Time    07/04/12  8:05 PM      Result Value Range   Sed Rate 27 (*) 0 - 22 mm/hr  C-REACTIVE PROTEIN     Status: Abnormal   Collection Time    07/04/12  8:05 PM      Result Value Range   CRP <0.5 (*) <0.60 mg/dL  ANAEROBIC CULTURE     Status: None   Collection Time    07/04/12 10:50 PM      Result Value Range   Specimen Description SYNOVIAL ANKLE LEFT     Special Requests Normal     Gram Stain       Value: RARE WBC PRESENT, PREDOMINANTLY MONONUCLEAR     NO ORGANISMS SEEN   Culture       Value: NO ANAEROBES ISOLATED; CULTURE IN PROGRESS FOR 5 DAYS   Report Status PENDING    CELL COUNT + DIFF,  W/ CRYST-SYNVL FLD     Status: Abnormal   Collection Time    07/04/12 10:50 PM      Result Value Range   Color, Synovial RED (*) YELLOW   Appearance-Synovial CLOUDY (*) CLEAR   Crystals, Fluid NO CRYSTALS SEEN     WBC, Synovial 7056 (*) 0 - 200 /cu mm   Neutrophil, Synovial 90 (*) 0 - 25 %   Lymphocytes-Synovial Fld  2  0 - 20 %   Monocyte-Macrophage-Synovial Fluid 8 (*) 50 - 90 %   Eosinophils-Synovial 0  0 - 1 %  BODY FLUID CULTURE     Status: None   Collection Time    07/04/12 10:50 PM      Result Value Range   Specimen Description SYNOVIAL ANKLE LEFT     Special Requests NONE     Gram Stain       Value: NO WBC SEEN     NO ORGANISMS SEEN   Culture NO GROWTH 1 DAY     Report Status PENDING    SURGICAL PCR SCREEN     Status: None   Collection Time    07/05/12  2:30 AM      Result Value Range   MRSA, PCR NEGATIVE  NEGATIVE   Staphylococcus aureus NEGATIVE  NEGATIVE   Comment:            The Xpert SA Assay (FDA     approved for NASAL specimens     in patients over 21 years  of age),     is one component of     a comprehensive surveillance     program.  Test performance has     been validated by [employer] for patients greater     than or equal to 42 year old.     It is not intended     to diagnose infection nor to     guide or monitor treatment.  TISSUE CULTURE     Status: None   Collection Time    07/05/12  4:15 PM      Result Value Range   Specimen Description TISSUE     Special Requests NONE LEFT ANKLE     Gram Stain       Value: RARE WBC PRESENT, PREDOMINANTLY MONONUCLEAR     NO ORGANISMS SEEN   Culture NO GROWTH 1 DAY     Report Status PENDING    ANAEROBIC CULTURE     Status: None   Collection Time    07/05/12  4:15 PM      Result Value Range   Specimen Description TISSUE     Special Requests NONE LEFTV ANKLE LEFT ANKLE     Gram Stain       Value: RARE WBC PRESENT, PREDOMINANTLY MONONUCLEAR     NO ORGANISMS SEEN   Culture       Value: NO ANAEROBES ISOLATED; CULTURE IN PROGRESS FOR 5 DAYS   Report Status PENDING    BODY FLUID CULTURE     Status: None   Collection Time    07/05/12  4:15 PM      Result Value Range   Specimen Description FLUID     Special Requests NONE FLUID ON SWAB     Gram Stain       Value: RARE WBC PRESENT, PREDOMINANTLY MONONUCLEAR     NO  ORGANISMS SEEN   Culture NO GROWTH     Report Status PENDING    CBC     Status: Abnormal   Collection Time    07/05/12  9:24 PM      Result Value Range   WBC 9.9  4.0 - 10.5 K/uL   RBC 4.42  3.87 - 5.11 MIL/uL   Hemoglobin 12.6  12.0 - 15.0 g/dL   HCT 40.9 (*) 81.1 - 91.4 %   MCV 78.3  78.0 - 100.0 fL   MCH 28.5  26.0 - 34.0 pg   MCHC 36.4 (*) 30.0 - 36.0 g/dL   RDW 78.2  95.6 - 21.3 %   Platelets 257  150 - 400 K/uL  CREATININE, SERUM     Status: None   Collection Time    07/05/12  9:24 PM      Result Value Range   Creatinine, Ser 0.70  0.50 - 1.10 mg/dL   GFR calc non Af Amer >90  >90 mL/min   GFR calc Af Amer >90  >90 mL/min   Comment:            The eGFR has been calculated     using the CKD EPI equation.     This calculation has not been     validated in all clinical     situations.     eGFR's persistently     <90 mL/min signify     possible Chronic Kidney Disease.    MICRO: 3/5 fluid CX PENDING 3/6 tissue CX PENDING  IMAGING: Dg Ankle Complete Left  07/04/2012  *RADIOLOGY  REPORT*  Clinical Data: Pain and swelling.  LEFT ANKLE COMPLETE - 3+ VIEW  Comparison: 12/03/2010  Findings: Changes of left ankle arthroplasty with tibial and talar components.  There is suggestion of mild lucency around both components.  There is diffuse soft tissue swelling.  Negative for fracture.  Small spurs dorsally in the midfoot and from the calcaneus.  IMPRESSION:  1.  Subtle lucency around the components of left ankle arthroplasty suggesting loosening or infection.   Original Report Authenticated By: D. Andria Rhein, MD     Assessment/Plan:  47yo F with left ankle joint replacement presents with periprosthetic joint infection POD#1 I x D, and removal of polyspacer, empirically on vancomycin and piptazo  - await culture results to determine final antibiotic regimen. Will likely include vancomycin but will probably find alternative for pip/tazo. - will need 4-6 wk of IV therapy, then  converted to an oral regimen for a total of 6 months - oral regimen may be challenging given her sulfa and tetracycline allergy - will set up follow up appt at [facility] in 4-6 wks.  More recs to follow. Dr. Ninetta Lights available over the weekend.Thank you for consultation.  Duke Salvia Drue Second MD MPH [facility] [phone]

## 2012-07-06 NOTE — Op Note (Addendum)
[name], [name]               ACCOUNT NO.:  [id number]  MEDICAL RECORD NO.:  [id number]  LOCATION:  6N28C                        FACILITY:  [facility]  PHYSICIAN:  Toni Arthurs, MD        DATE OF BIRTH:  1966/01/11  DATE OF PROCEDURE:  07/05/2012 DATE OF DISCHARGE:                              OPERATIVE REPORT   PREOPERATIVE DIAGNOSIS:  Left total ankle periprosthetic infection.  POSTOPERATIVE DIAGNOSIS:  left total ankle periprosthetic infection.  PROCEDURE:  1.  irrigation and debridement of left total ankle with synovectomy 2.  Polyethylene exchange (8 mm)   SURGEON:  Toni Arthurs, MD  ANESTHESIA:  General.  ESTIMATED BLOOD LOSS:  Minimal.  TOURNIQUET TIME:  78 minutes at 250 mmHg.  COMPLICATIONS:  None apparent.  DISPOSITION:  Extubated, awake and stable to recovery.  SPECIMENS:  Deep tissue from the synovium and synovial fluid to Microbiology for aerobic and anaerobic culture.  INDICATIONS FOR PROCEDURE:  The patient is a 47 year old woman who is approximately 18 months status post left total ankle replacement.  She noted sudden onset of severe left ankle pain yesterday at work.  She presented to the emergency department.  Sed rate was elevated. Aspiration was performed, and cell count was greater than 7000 white blood cells with an elevated neutrophil differential.  She presents now for irrigation and debridement of the ankle with polyethylene exchange. We discussed in detail the possibility of removing all the hardware and placing the antibiotic spacer or trying a single stage washout with poly exchange.  This has 60-70% chance of success but has the benefit of keeping her at only 1 operation.  She understands the risks and benefits, the alternative of treatment options of trying the poly exchange.  She understands that this may later on require revision washout and removal of all the hardware.  She understands specifically risks of bleeding, infection, nerve  damage, blood clots, need for additional surgery, amputation, and death.  PROCEDURE IN DETAIL:  After preoperative consent was obtained and the correct operative site was identified, the patient was brought to the operating room and placed supine on the operating table.  General anesthesia was induced.  Preoperative antibiotics were held.  A surgical time-out was taken.  The left lower extremity was prepped and draped in standard sterile fashion with a tourniquet around the thigh.  The extremity was exsanguinated, and the tourniquet was inflated to 250 mmHg.  The patient's previous anterior incision was made.  Sharp dissection was carried down through the skin and subcutaneous tissue to the level of the extensor retinaculum.  The retinaculum over the extensor hallucis longus tendon was incised and extended proximally and distally.  Subperiosteal dissection was carried along the anterior aspect of the tibia, exposing the anterior aspect of the ankle joint. The anterior joint capsule was then excised sharply.  This tissue was sent as a specimen to Microbiology.  Fluid in the ankle joint was then cultured separately and sent as a specimen to Microbiology as well.  The polyethylene spacer was removed.  A complete synovectomy was then performed removing all soft tissue from the medial and lateral joint lines as well as anterior and posterior.  The wound was then irrigated with 9 L of normal saline using the pulse lavage.  Betadine prep mixed with normal saline was then poured into the ankle joint and allowed to sit for approximately 5 minutes.  The fluid was exchanged approximately every 1 minute.  This wound was irrigated out with pulse lavage.  A new 8-mm polyethylene spacer was placed in the joint.  The ankle ranged easily.  The anterior joint capsule was then repaired with 2-0 PDS simple sutures.  The extensor retinaculum was repaired with simple sutures of 2-0 PDS.  Subcutaneous tissue  was approximated with inverted simple sutures of 2-0 PDS.  The skin was closed with a running 3-0 nylon.  Sterile dressings were applied.  The tourniquet was released at 78 minutes.  Vancomycin had been administered intravenously after obtaining the deep tissue cultures.  Sterile dressings were applied followed by a well-padded short-leg splint.  The patient was awakened from anesthesia and transported to recovery room in stable condition.  FOLLOWUP PLAN:  The patient will be started on vancomycin and Zosyn. She will have Infectious Disease consultation and likely will need a PICC line and 6 weeks of IV antibiotics based on the culture results.     Toni Arthurs, MD     JH/MEDQ  D:  07/05/2012  T:  07/06/2012  Job:  [id number]

## 2012-07-06 NOTE — Evaluation (Signed)
Physical Therapy Evaluation Patient Details Name: [name] MRN: [id number] DOB: [birth year] Today's Date: 07/06/2012 Time: 1040-1107 PT Time Calculation (min): 27 min  PT Assessment / Plan / Recommendation Clinical Impression  Pt is a pleasnt 47 yo female whoe came in for infected ankle hardware. Pt underwent I&D and spacer placement. Patient demonstrates good overall mobility secondary to experience. Anticipate patient will continue to progress well. Pt will benefit from continued skilled PT to address stair training prior to discharge.  Spoke at length with patient regarding home needs and management methods including assist/set up for meal prep,, mobility expectations, and overall hints for independence.    PT Assessment  Patient needs continued PT services    Follow Up Recommendations  No PT follow up    Does the patient have the potential to tolerate intense rehabilitation      Barriers to Discharge        Equipment Recommendations  Rolling walker with 5" wheels    Recommendations for Other Services     Frequency Min 4X/week (to see Saturday for d/c home)    Precautions / Restrictions Restrictions Weight Bearing Restrictions: Yes LLE Weight Bearing: Non weight bearing   Pertinent Vitals/Pain 4/10      Mobility  Bed Mobility Bed Mobility: Supine to Sit;Sitting - Scoot to Edge of Bed Supine to Sit: 6: Modified independent (Device/Increase time) Sitting - Scoot to Edge of Bed: 6: Modified independent (Device/Increase time) Details for Bed Mobility Assistance: Mod I for increased time Transfers Transfers: Sit to Stand;Stand to Sit Sit to Stand: 5: Supervision Stand to Sit: 5: Supervision Details for Transfer Assistance: VC's initially for hand placement with good carryover throughout session Ambulation/Gait Ambulation/Gait Assistance: 5: Supervision Ambulation Distance (Feet): 30 Feet Assistive device: Rolling walker Ambulation/Gait Assistance Details: Patient  very steady with use of rw Gait Pattern: Step-to pattern Gait velocity: decreased Stairs: No        PT Diagnosis: Difficulty walking;Acute pain  PT Problem List: Decreased strength;Decreased range of motion;Decreased activity tolerance;Decreased mobility;Pain PT Treatment Interventions: DME instruction;Gait training;Stair training;Functional mobility training;Therapeutic activities;Therapeutic exercise   PT Goals    Visit Information  Last PT Received On: 07/06/12 Assistance Needed: +1    Subjective Data  Subjective: I have been through this a few times; the pain is moderate Patient Stated Goal: to go home   Prior Functioning  Home Living Lives With: Alone Available Help at Discharge: Family;Available PRN/intermittently Type of Home: Apartment Home Access: Stairs to enter Entrance Stairs-Number of Steps: 3 Entrance Stairs-Rails: None Home Layout: Two level Alternate Level Stairs-Number of Steps: 5 Alternate Level Stairs-Rails: Right;Left;Can reach both Bathroom Shower/Tub: Engineer, manufacturing systems: Standard Home Adaptive Equipment: Straight cane Prior Function Level of Independence: Independent Able to Take Stairs?: Yes Driving: Yes Vocation: Full time employment Comments: on your feet Communication Communication: No difficulties Dominant Hand: Right    Cognition  Cognition Overall Cognitive Status: Appears within functional limits for tasks assessed/performed Arousal/Alertness: Awake/alert Orientation Level: Appears intact for tasks assessed;Oriented X4 / Intact Behavior During Session: [facility] for tasks performed    Extremity/Trunk Assessment Right Upper Extremity Assessment RUE ROM/Strength/Tone: [facility] for tasks assessed Left Upper Extremity Assessment LUE ROM/Strength/Tone: WFL for tasks assessed Right Lower Extremity Assessment RLE ROM/Strength/Tone: [facility] for tasks assessed Left Lower Extremity Assessment LLE ROM/Strength/Tone: Deficits;Unable to fully  assess;Due to pain;Due to precautions Trunk Assessment Trunk Assessment: Normal   Balance    End of Session PT - End of Session Equipment Utilized During Treatment: Gait belt  Activity Tolerance: Patient tolerated treatment well Patient left: in chair;with call bell/phone within reach Nurse Communication: Mobility status;Patient requests pain meds   Fabio Asa 07/06/2012, 12:22 PM Charlotte Crumb, PT DPT  [phone]

## 2012-07-07 DIAGNOSIS — M869 Osteomyelitis, unspecified: Secondary | ICD-10-CM

## 2012-07-07 MED ORDER — RIFAMPIN 300 MG PO CAPS
300.0000 mg | ORAL_CAPSULE | Freq: Two times a day (BID) | ORAL | Status: DC
  Administered 2012-07-07 – 2012-07-09 (×5): 300 mg via ORAL
  Filled 2012-07-07 (×6): qty 1

## 2012-07-07 NOTE — Progress Notes (Signed)
INFECTIOUS DISEASE PROGRESS NOTE  ID: [name] is a 47 y.o. female with   Active Problems:   * No active hospital problems. *  Subjective: Without complaints  Abtx:  Anti-infectives   Start     Dose/Rate Route Frequency Ordered Stop   07/07/12 1000  rifampin (RIFADIN) capsule 300 mg     300 mg Oral Every 12 hours 07/07/12 0849     07/06/12 0200  vancomycin (VANCOCIN) IVPB 1000 mg/200 mL premix     1,000 mg 200 mL/hr over 60 Minutes Intravenous Every 8 hours 07/05/12 2109     07/05/12 2200  piperacillin-tazobactam (ZOSYN) IVPB 3.375 g     3.375 g 12.5 mL/hr over 240 Minutes Intravenous 3 times per day 07/05/12 2109     07/05/12 1730  vancomycin (VANCOCIN) IVPB 1000 mg/200 mL premix  Status:  Discontinued     1,000 mg 200 mL/hr over 60 Minutes Intravenous  Once 07/05/12 1729 07/05/12 2035      Medications:  Scheduled: . docusate sodium  100 mg Oral BID  . enoxaparin (LOVENOX) injection  40 mg Subcutaneous Q24H  . losartan  50 mg Oral Daily  . piperacillin-tazobactam (ZOSYN)  IV  3.375 g Intravenous Q8H  . rifampin  300 mg Oral Q12H  . scopolamine  1 patch Transdermal Once  . senna  2 tablet Oral BID  . spironolactone  25 mg Oral Daily  . vancomycin  1,000 mg Intravenous Q8H    Objective: Vital signs in last 24 hours: Temp:  [97.7 F (36.5 C)-98.5 F (36.9 C)] 97.7 F (36.5 C) (03/08 0623) Pulse Rate:  [45-54] 48 (03/08 0623) Resp:  [18-20] 18 (03/08 0623) BP: (118-132)/(46-67) 118/46 mmHg (03/08 0623) SpO2:  [96 %-100 %] 97 % (03/08 0623)   General appearance: alert, cooperative and no distress Extremities: L foot/ankle wrapped. normal light touch.   Lab Results  Recent Labs  07/04/12 1927 07/05/12 2124  WBC 6.8 9.9  HGB 12.3 12.6  HCT 33.7* 34.6*  NA 136  --   K 3.8  --   CL 103  --   CO2 25  --   BUN 12  --   CREATININE 0.74 0.70   Liver Panel No results found for this basename: PROT, ALBUMIN, AST, ALT, ALKPHOS, BILITOT, BILIDIR,  IBILI,  in the last 72 hours Sedimentation Rate  Recent Labs  07/04/12 2005  ESRSEDRATE 27*   C-Reactive Protein  Recent Labs  07/04/12 2005  CRP <0.5*    Microbiology: Recent Results (from the past 240 hour(s))  ANAEROBIC CULTURE     Status: None   Collection Time    07/04/12 10:50 PM      Result Value Range Status   Specimen Description SYNOVIAL ANKLE LEFT   Final   Special Requests Normal   Final   Gram Stain     Final   Value: RARE WBC PRESENT, PREDOMINANTLY MONONUCLEAR     NO ORGANISMS SEEN   Culture     Final   Value: NO ANAEROBES ISOLATED; CULTURE IN PROGRESS FOR 5 DAYS   Report Status PENDING   Incomplete  BODY FLUID CULTURE     Status: None   Collection Time    07/04/12 10:50 PM      Result Value Range Status   Specimen Description SYNOVIAL ANKLE LEFT   Final   Special Requests NONE   Final   Gram Stain     Final   Value: NO WBC SEEN  NO ORGANISMS SEEN   Culture NO GROWTH 1 DAY   Final   Report Status PENDING   Incomplete  SURGICAL PCR SCREEN     Status: None   Collection Time    07/05/12  2:30 AM      Result Value Range Status   MRSA, PCR NEGATIVE  NEGATIVE Final   Staphylococcus aureus NEGATIVE  NEGATIVE Final   Comment:            The Xpert SA Assay (FDA     approved for NASAL specimens     in patients over 33 years of age),     is one component of     a comprehensive surveillance     program.  Test performance has     been validated by [employer] for patients greater     than or equal to 76 year old.     It is not intended     to diagnose infection nor to     guide or monitor treatment.  TISSUE CULTURE     Status: None   Collection Time    07/05/12  4:15 PM      Result Value Range Status   Specimen Description TISSUE   Final   Special Requests NONE LEFT ANKLE   Final   Gram Stain     Final   Value: RARE WBC PRESENT, PREDOMINANTLY MONONUCLEAR     NO ORGANISMS SEEN   Culture NO GROWTH 2 DAYS   Final   Report Status PENDING    Incomplete  ANAEROBIC CULTURE     Status: None   Collection Time    07/05/12  4:15 PM      Result Value Range Status   Specimen Description TISSUE   Final   Special Requests NONE LEFTV ANKLE LEFT ANKLE   Final   Gram Stain     Final   Value: RARE WBC PRESENT, PREDOMINANTLY MONONUCLEAR     NO ORGANISMS SEEN   Culture     Final   Value: NO ANAEROBES ISOLATED; CULTURE IN PROGRESS FOR 5 DAYS   Report Status PENDING   Incomplete  BODY FLUID CULTURE     Status: None   Collection Time    07/05/12  4:15 PM      Result Value Range Status   Specimen Description FLUID   Final   Special Requests NONE FLUID ON SWAB   Final   Gram Stain     Final   Value: RARE WBC PRESENT, PREDOMINANTLY MONONUCLEAR     NO ORGANISMS SEEN   Culture NO GROWTH   Final   Report Status PENDING   Incomplete    Studies/Results: No results found.   Assessment/Plan: Osteomyelitis S/p removal of infected infected hardware 3-6, spacer implanted  Total days of antibiotics 3 (vanco/rifampin/zosyn)  Check LFTs while on rifampin.  ESR and CRP are minimally elevated and NL respectively.  PIC ordered.  HIV Ab pending.  Await Cx for final anbx rec          Johny Sax Infectious Diseases [phone] 07/07/2012, 12:31 PM   LOS: 3 days

## 2012-07-07 NOTE — Progress Notes (Signed)
Patient ID: [name], female   DOB: 30-Aug-1965, 47 y.o.   MRN: [id number] Subjective: 2 Days Post-Op Procedure(s) (LRB): Removal of total ankle implants,I & D with insertion of  new poly spacer (Left)    Patient reports pain as moderate. But up with therapy in exercise room.  No events  Objective:   VITALS:   Filed Vitals:   07/07/12 0623  BP: 118/46  Pulse: 48  Temp: 97.7 F (36.5 C)  Resp: 18    Incision: dressing C/D/I post op splint intact without drainage  LABS  Recent Labs  07/04/12 1927 07/05/12 2124  HGB 12.3 12.6  HCT 33.7* 34.6*  WBC 6.8 9.9  PLT 245 257     Recent Labs  07/04/12 1927 07/05/12 2124  NA 136  --   K 3.8  --   BUN 12  --   CREATININE 0.74 0.70  GLUCOSE 94  --     No results found for this basename: LABPT, INR,  in the last 72 hours   Assessment/Plan: 2 Days Post-Op Procedure(s) (LRB): Removal of total ankle implants,I & D with insertion of  new poly spacer (Left)   Continue ABX therapy due to infected left ankle replacment Added rifampin as synergism with vancomycin ID consult on chart awaiting final cultures

## 2012-07-07 NOTE — Progress Notes (Signed)
Physical Therapy Treatment Patient Details Name: [name] MRN: [id number] DOB: 12/01/1965 Today's Date: 07/07/2012 Time: 4098-1191 PT Time Calculation (min): 27 min  PT Assessment / Plan / Recommendation Comments on Treatment Session  Patient with improved activity tolerance, increasing distance with gait.  Did well on stairs - could practice again prior to discharge.    Follow Up Recommendations  No PT follow up;Supervision - Intermittent     Does the patient have the potential to tolerate intense rehabilitation     Barriers to Discharge        Equipment Recommendations  Rolling walker with 5" wheels    Recommendations for Other Services    Frequency Min 4X/week   Plan Discharge plan remains appropriate;Frequency remains appropriate    Precautions / Restrictions Precautions Precautions: None Restrictions Weight Bearing Restrictions: Yes LLE Weight Bearing: Non weight bearing   Pertinent Vitals/Pain     Mobility  Bed Mobility Bed Mobility: Supine to Sit;Sitting - Scoot to Edge of Bed Supine to Sit: 6: Modified independent (Device/Increase time);With rails Sitting - Scoot to Edge of Bed: 6: Modified independent (Device/Increase time);With rail Details for Bed Mobility Assistance: No cues needed Transfers Transfers: Sit to Stand;Stand to Sit Sit to Stand: 5: Supervision;With upper extremity assist;From bed;With armrests;From chair/3-in-1 Stand to Sit: 5: Supervision;With upper extremity assist;With armrests;To chair/3-in-1 Details for Transfer Assistance: Patient using correct hand placement and safe technique. Ambulation/Gait Ambulation/Gait Assistance: 5: Supervision Ambulation Distance (Feet): 70 Feet Assistive device: Rolling walker Ambulation/Gait Assistance Details: Able to safely use RW and maintain NWB of LLE.  Improved endurance today. Gait Pattern: Step-to pattern Gait velocity: decreased Stairs: Yes Stairs Assistance: 4: Min assist Stairs Assistance  Details (indicate cue type and reason): Verbal and visual instructions on safe navigation of stairs with RW going up stairs backward.  Assist to steady RW, and verbal cues for reminders of sequence. Stair Management Technique: No rails;Step to pattern;Backwards;With walker Number of Stairs: 3      PT Goals Acute Rehab PT Goals PT Goal Formulation: With patient Time For Goal Achievement: 07/13/12 Potential to Achieve Goals: Good Pt will go Sit to Stand: with modified independence;with upper extremity assist PT Goal: Sit to Stand - Progress: Goal set today Pt will go Stand to Sit: with modified independence;with upper extremity assist PT Goal: Stand to Sit - Progress: Goal set today Pt will Ambulate: 51 - 150 feet;with modified independence;with rolling walker PT Goal: Ambulate - Progress: Goal set today Pt will Go Up / Down Stairs: 3-5 stairs;with supervision;with rolling walker PT Goal: Up/Down Stairs - Progress: Goal set today  Visit Information  Last PT Received On: 07/07/12 Assistance Needed: +1    Subjective Data  Subjective: "It's coming back to me." (Regarding stairs)   Cognition  Cognition Overall Cognitive Status: Appears within functional limits for tasks assessed/performed Arousal/Alertness: Awake/alert Orientation Level: Appears intact for tasks assessed;Oriented X4 / Intact Behavior During Session: [facility] for tasks performed    Balance     End of Session PT - End of Session Equipment Utilized During Treatment: Gait belt Activity Tolerance: Patient tolerated treatment well Patient left: in chair;with call bell/phone within reach Nurse Communication: Mobility status (Taking patient off floor to therapy gym on orthopedics)   GP     Vena Austria 07/07/2012, 12:29 PM Durenda Hurt. Renaldo Fiddler, [facility] Acute Rehab Services Pager [phone]

## 2012-07-08 LAB — BODY FLUID CULTURE
Culture: NO GROWTH
Culture: NO GROWTH
Gram Stain: NONE SEEN

## 2012-07-08 LAB — COMPREHENSIVE METABOLIC PANEL
ALT: 28 U/L (ref 0–35)
Albumin: 3 g/dL — ABNORMAL LOW (ref 3.5–5.2)
Alkaline Phosphatase: 66 U/L (ref 39–117)
Potassium: 3.6 mEq/L (ref 3.5–5.1)
Sodium: 141 mEq/L (ref 135–145)
Total Protein: 7 g/dL (ref 6.0–8.3)

## 2012-07-08 LAB — TISSUE CULTURE: Culture: NO GROWTH

## 2012-07-08 MED ORDER — OXYCODONE HCL 5 MG PO TABS
5.0000 mg | ORAL_TABLET | ORAL | Status: DC | PRN
  Administered 2012-07-08 – 2012-07-09 (×2): 10 mg via ORAL
  Filled 2012-07-08 (×2): qty 2

## 2012-07-08 NOTE — Progress Notes (Signed)
ANTIBIOTIC CONSULT NOTE   Pharmacy Consult for Vancomycin and Zosyn Indication: left ankle infection  Allergies  Allergen Reactions  . Tetracycline Hcl Shortness Of Breath, Swelling and Other (See Comments)    Large purple bruise  . Septra (Bactrim) Itching, Swelling and Other (See Comments)    Purple bruise  . Sulfamethoxazole W-Trimethoprim Itching, Swelling and Other (See Comments)    Purple bruise    Patient Measurements: Height: 5' 7.5" (171.5 cm) Weight: 216 lb (97.977 kg) IBW/kg (Calculated) : 62.75  Vital Signs: Temp: 98 F (36.7 C) (03/09 0556) Temp src: Oral (03/09 0556) BP: 151/66 mmHg (03/09 0556) Pulse Rate: 47 (03/09 0556) Intake/Output from previous day: 03/08 0701 - 03/09 0700 In: 2981.7 [P.O.:600; I.V.:2381.7] Out: 1875 [Urine:1875] Intake/Output from this shift: Total I/O In: -  Out: 2000 [Urine:2000]  Labs:  Recent Labs  07/05/12 2124 07/08/12 0522  WBC 9.9  --   HGB 12.6  --   PLT 257  --   CREATININE 0.70 0.87   Estimated Creatinine Clearance: 96 ml/min (by C-G formula based on Cr of 0.87).    Microbiology: Recent Results (from the past 720 hour(s))  ANAEROBIC CULTURE     Status: None   Collection Time    07/04/12 10:50 PM      Result Value Range Status   Specimen Description SYNOVIAL ANKLE LEFT   Final   Special Requests Normal   Final   Gram Stain     Final   Value: RARE WBC PRESENT, PREDOMINANTLY MONONUCLEAR     NO ORGANISMS SEEN   Culture     Final   Value: NO ANAEROBES ISOLATED; CULTURE IN PROGRESS FOR 5 DAYS   Report Status PENDING   Incomplete  BODY FLUID CULTURE     Status: None   Collection Time    07/04/12 10:50 PM      Result Value Range Status   Specimen Description SYNOVIAL ANKLE LEFT   Final   Special Requests NONE   Final   Gram Stain     Final   Value: NO WBC SEEN     NO ORGANISMS SEEN   Culture NO GROWTH 2 DAYS   Final   Report Status PENDING   Incomplete  SURGICAL PCR SCREEN     Status: None   Collection  Time    07/05/12  2:30 AM      Result Value Range Status   MRSA, PCR NEGATIVE  NEGATIVE Final   Staphylococcus aureus NEGATIVE  NEGATIVE Final   Comment:            The Xpert SA Assay (FDA     approved for NASAL specimens     in patients over 16 years of age),     is one component of     a comprehensive surveillance     program.  Test performance has     been validated by [employer] for patients greater     than or equal to 54 year old.     It is not intended     to diagnose infection nor to     guide or monitor treatment.  TISSUE CULTURE     Status: None   Collection Time    07/05/12  4:15 PM      Result Value Range Status   Specimen Description TISSUE   Final   Special Requests NONE LEFT ANKLE   Final   Gram Stain     Final  Value: RARE WBC PRESENT, PREDOMINANTLY MONONUCLEAR     NO ORGANISMS SEEN   Culture NO GROWTH 3 DAYS   Final   Report Status 07/08/2012 FINAL   Final  ANAEROBIC CULTURE     Status: None   Collection Time    07/05/12  4:15 PM      Result Value Range Status   Specimen Description TISSUE   Final   Special Requests NONE LEFTV ANKLE LEFT ANKLE   Final   Gram Stain     Final   Value: RARE WBC PRESENT, PREDOMINANTLY MONONUCLEAR     NO ORGANISMS SEEN   Culture     Final   Value: NO ANAEROBES ISOLATED; CULTURE IN PROGRESS FOR 5 DAYS   Report Status PENDING   Incomplete  BODY FLUID CULTURE     Status: None   Collection Time    07/05/12  4:15 PM      Result Value Range Status   Specimen Description FLUID   Final   Special Requests NONE FLUID ON SWAB   Final   Gram Stain     Final   Value: RARE WBC PRESENT, PREDOMINANTLY MONONUCLEAR     NO ORGANISMS SEEN   Culture NO GROWTH 2 DAYS   Final   Report Status PENDING   Incomplete    Medical History: Past Medical History  Diagnosis Date  . Allergy   . Hypertension   . PONV (postoperative nausea and vomiting)     N/V and trouble urinating after surgery   . Chronic kidney disease     right renal  cyst   . GERD (gastroesophageal reflux disease)     hx of no problems at present   . Headache     occasional headache   . Arthritis     Lt ankle    Medications:  Prescriptions prior to admission  Medication Sig Dispense Refill  . aspirin EC 81 MG tablet Take 81 mg by mouth daily.      Marland Kitchen losartan (COZAAR) 50 MG tablet Take 50 mg by mouth daily.      Marland Kitchen spironolactone (ALDACTONE) 25 MG tablet Take 1 tablet (25 mg total) by mouth daily.  90 tablet  3  . vitamin E 400 UNIT capsule Take 400 Units by mouth daily.       Marland Kitchen zolpidem (AMBIEN) 10 MG tablet Take 5 mg by mouth at bedtime as needed. For sleep       Assessment: 47 yo F with acute onset of L ankle pain and swelling on 07/04/12.  Pt has a hx of L total ankle replacement 1 year ago.  Now s/p OR 07/05/12 for I&D of L ankle joint and placement of antibiotic spacer.  Rifampin added for osteomyelitis.  vanc 3.6>> Zosyn 3.6>>  3/6 tissue cx L ankle: negative 3/6 body fluid cx: pending 3/5 body fluid cx: pending 3/5 MRSA PCR neg  Goal of Therapy:  Vancomycin trough level 15-20 mcg/ml  Plan:  - Continue Vancomycin 1gm IV Q8h - Continue Zosyn 3.375 gm IV q8h (4 hour infusion) - Follow-up culture data, renal function, and clinical progress - Weekly Vancomycin trough  Alison L. Illene Bolus, PharmD, BCPS Clinical Pharmacist Pager: [phone] Pharmacy: [phone] 07/08/2012 11:23 AM

## 2012-07-08 NOTE — Progress Notes (Signed)
Subjective: 3 Days Post-Op Procedure(s) (LRB): Removal of total ankle implants,I & D with insertion of  new poly spacer (Left) Patient reports pain as mild.    Objective: Vital signs in last 24 hours: Temp:  [98 F (36.7 C)-98.8 F (37.1 C)] 98 F (36.7 C) (03/09 0556) Pulse Rate:  [47-69] 47 (03/09 0556) Resp:  [18-20] 18 (03/09 0556) BP: (116-151)/(57-66) 151/66 mmHg (03/09 0556) SpO2:  [97 %-98 %] 97 % (03/09 0556)  Intake/Output from previous day: 03/08 0701 - 03/09 0700 In: 2981.7 [P.O.:600; I.V.:2381.7] Out: 1875 [Urine:1875] Intake/Output this shift: Total I/O In: -  Out: 2000 [Urine:2000]   Recent Labs  07/05/12 2124  HGB 12.6    Recent Labs  07/05/12 2124  WBC 9.9  RBC 4.42  HCT 34.6*  PLT 257    Recent Labs  07/05/12 2124 07/08/12 0522  NA  --  141  K  --  3.6  CL  --  104  CO2  --  27  BUN  --  9  CREATININE 0.70 0.87  GLUCOSE  --  95  CALCIUM  --  8.8   No results found for this basename: LABPT, INR,  in the last 72 hours  Neurologically intact Neurovascular intact Sensation intact distally Intact pulses distally Dorsiflexion/Plantar flexion intact Incision: dressing C/D/I No cellulitis present Compartment soft  Assessment/Plan: 3 Days Post-Op Procedure(s) (LRB): Removal of total ankle implants,I & D with insertion of  new poly spacer (Left) Advance diet Up with therapy D/C IV fluids Continue abx per ID Appreciate ID input Cultures pending PIC placement pending  Senna Lape M. 07/08/2012, 10:07 AM

## 2012-07-09 ENCOUNTER — Encounter (HOSPITAL_COMMUNITY): Payer: Self-pay | Admitting: Orthopedic Surgery

## 2012-07-09 ENCOUNTER — Inpatient Hospital Stay (HOSPITAL_COMMUNITY): Payer: BC Managed Care – PPO

## 2012-07-09 MED ORDER — VANCOMYCIN HCL IN DEXTROSE 1-5 GM/200ML-% IV SOLN: 1000.0000 mg | Freq: Three times a day (TID) | INTRAVENOUS | Status: AC

## 2012-07-09 MED ORDER — OXYCODONE HCL 5 MG PO TABS: 5.0000 mg | ORAL_TABLET | ORAL | Status: DC | PRN

## 2012-07-09 MED ORDER — HEPARIN SOD (PORK) LOCK FLUSH 100 UNIT/ML IV SOLN
250.0000 [IU] | Freq: Every day | INTRAVENOUS | Status: DC
  Filled 2012-07-09: qty 3

## 2012-07-09 MED ORDER — HEPARIN SOD (PORK) LOCK FLUSH 100 UNIT/ML IV SOLN
250.0000 [IU] | INTRAVENOUS | Status: DC | PRN
  Administered 2012-07-09: 250 [IU]
  Filled 2012-07-09: qty 3

## 2012-07-09 MED ORDER — RIFAMPIN 300 MG PO CAPS: 300.0000 mg | ORAL_CAPSULE | Freq: Two times a day (BID) | ORAL | Status: AC

## 2012-07-09 NOTE — Progress Notes (Signed)
Physical Therapy Treatment Patient Details Name: JAMIAH HOMEYER MRN: [id number] DOB: 1966-02-28 Today's Date: 07/09/2012 Time: 4098-1191 PT Time Calculation (min): 25 min  PT Assessment / Plan / Recommendation Comments on Treatment Session  Pt making great progress toward goals with completion of stair instruction today and demo's safe techniques with transfers and gait.    Follow Up Recommendations  No PT follow up;Supervision - Intermittent           Equipment Recommendations  Rolling walker with 5" wheels       Frequency Min 4X/week   Plan Discharge plan remains appropriate;Frequency remains appropriate    Precautions / Restrictions Precautions Precautions: None Restrictions LLE Weight Bearing: Non weight bearing       Mobility  Bed Mobility Supine to Sit: 6: Modified independent (Device/Increase time);HOB flat Sitting - Scoot to Edge of Bed: 6: Modified independent (Device/Increase time) Details for Bed Mobility Assistance: no rails, cues or assistance needed today. Transfers Sit to Stand: 6: Modified independent (Device/Increase time);From bed;From chair/3-in-1;With upper extremity assist;With armrests Stand to Sit: 6: Modified independent (Device/Increase time);To chair/3-in-1;With upper extremity assist;With armrests Details for Transfer Assistance: no cues or assistance needed, only incr time. Ambulation/Gait Ambulation/Gait Assistance: 5: Supervision Ambulation Distance (Feet): 15 Feet (15 feet x2 reps) Assistive device: Rolling walker Ambulation/Gait Assistance Details: Limited gait distance today due to focus on completing stair instruction. Demo's safe technique with RW and maintains NWB on LLE. Gait Pattern: Step-to pattern Gait velocity: decreased Stairs Assistance: 4: Min assist Stairs Assistance Details (indicate cue type and reason): Assist to stabilize RW only, min reminder cues of hand placement on walker with going up/down stairs backwards. Handout  issued with instructions for pt/son to use as a reference at home. Stair Management Technique: No rails;Step to pattern;Backwards;With walker Number of Stairs: 3      PT Goals Acute Rehab PT Goals PT Goal: Sit to Stand - Progress: Met PT Goal: Stand to Sit - Progress: Met PT Goal: Ambulate - Progress: Progressing toward goal PT Goal: Up/Down Stairs - Progress: Met  Visit Information  Last PT Received On: 07/09/12 Assistance Needed: +1    Subjective Data  Subjective: No new complaints, agreeable to therapy at this time.   Cognition  Cognition Overall Cognitive Status: Appears within functional limits for tasks assessed/performed Arousal/Alertness: Awake/alert Orientation Level: Appears intact for tasks assessed Behavior During Session: [facility] for tasks performed       End of Session PT - End of Session Equipment Utilized During Treatment: Gait belt Activity Tolerance: Patient tolerated treatment well Patient left: in chair;with call bell/phone within reach Nurse Communication: Mobility status   GP     Sallyanne Kuster 07/09/2012, 10:45 AM  Sallyanne Kuster, PTA Office- [phone]

## 2012-07-09 NOTE — Care Management Note (Signed)
  Page 1 of 1   07/09/2012     10:38:20 AM   CARE MANAGEMENT NOTE 07/09/2012  Patient:  [name], [name]   Account Number:  [id number]  Date Initiated:  07/09/2012  Documentation initiated by:  Ronny Flurry  Subjective/Objective Assessment:     Action/Plan:   Anticipated DC Date:  07/09/2012   Anticipated DC Plan:  HOME W HOME HEALTH SERVICES         Choice offered to / List presented to:  C-1 Patient   DME arranged  Levan Hurst      DME agency  [facility]     [facility] arranged  HH-1 RN      [facility] agency  [facility]   Status of service:  Completed, signed off Medicare Important Message given?   (If response is "NO", the following Medicare IM given date fields will be blank) Date Medicare IM given:   Date Additional Medicare IM given:    Discharge Disposition:    Per UR Regulation:  Reviewed for med. necessity/level of care/duration of stay  If discussed at Long Length of Stay Meetings, dates discussed:    Comments:

## 2012-07-09 NOTE — Procedures (Signed)
Successful image guided SL PICC line placement to right basilic vein. Length 37cm, tip at lower SVC/RA No complications Ready for use.

## 2012-07-09 NOTE — Discharge Summary (Signed)
Physician Discharge Summary  Patient ID: [name] MRN: [id number] DOB/AGE: 10-13-1965 47 y.o.  Admit date: 07/04/2012 Discharge date: 07/09/2012  Admission Diagnoses:  Left ankle periprosthetic infection, htn  Discharge Diagnoses:  Same s/p I and D of left ankle with poly exchange  Discharged Condition: stable  Hospital Course: Pt was admitted on 3/5 from the ER with acute left ankle pain.  ESR was elevated and aspiration showed > 7k WBCs.  She went tot he OR the following day for I and D with poly exchange since her pain had started less than 24 hours prior.  She tolerated the procedure well and was discharged to home in stable condition.  She is dischatged on IV vanc and oral rifampin for 38 more days since her cultures were ultimately negative for any growth.  Consults: ID  Significant Diagnostic Studies: microbiology: wound culture: negative  Treatments: antibiotics: vancomycin  Discharge Exam: Blood pressure 131/72, pulse 51, temperature 98.4 F (36.9 C), temperature source Oral, resp. rate 17, height 5' 7.5" (1.715 m), weight 97.977 kg (216 lb), SpO2 100.00%. L LE splinted.  VNI at toes.  Brisk cap refill.  Disposition: 01-Home or Self Care  Discharge Orders   Future Orders Complete By Expires     Call MD / Call 911  As directed     Comments:      If you experience chest pain or shortness of breath, CALL 911 and be transported to the hospital emergency room.  If you develope a fever above 101 F, pus (white drainage) or increased drainage or redness at the wound, or calf pain, call your surgeon's office.    Constipation Prevention  As directed     Comments:      Drink plenty of fluids.  Prune juice may be helpful.  You may use a stool softener, such as Colace (over the counter) 100 mg twice a day.  Use MiraLax (over the counter) for constipation as needed.    Diet - low sodium heart healthy  As directed     Increase activity slowly as tolerated  As directed          Medication List    TAKE these medications       aspirin EC 81 MG tablet  Take 81 mg by mouth daily.     losartan 50 MG tablet  Commonly known as:  COZAAR  Take 50 mg by mouth daily.     oxyCODONE 5 MG immediate release tablet  Commonly known as:  Oxy IR/ROXICODONE  Take 1-2 tablets (5-10 mg total) by mouth every 4 (four) hours as needed.     rifampin 300 MG capsule  Commonly known as:  RIFADIN  Take 1 capsule (300 mg total) by mouth every 12 (twelve) hours.     spironolactone 25 MG tablet  Commonly known as:  ALDACTONE  Take 1 tablet (25 mg total) by mouth daily.     vancomycin 1 GM/200ML Soln  Commonly known as:  VANCOCIN  Inject 200 mLs (1,000 mg total) into the vein every 8 (eight) hours.     vitamin E 400 UNIT capsule  Take 400 Units by mouth daily.     zolpidem 10 MG tablet  Commonly known as:  AMBIEN  Take 5 mg by mouth at bedtime as needed. For sleep           Follow-up Information   Follow up with Toni Arthurs, MD. Schedule an appointment as soon as possible for a visit in  10 days.   Contact information:   [address], [address] [place] [address] [phone]       Signed: Toni Arthurs 07/09/2012, 7:25 AM

## 2012-07-09 NOTE — Progress Notes (Signed)
Patient discharged to home with son.  Discharge teaching completed including follow up care, medications, activity and PICC line care.  Verbalizes understanding with no further questions.  Dressing to PICC line dry and intact.  Home health to visit patient this evening for IV antibiotics.  Patient understands and has contact information for [facility].  Walker delivered to room and sent home with patient.  Vital signs stable.  Discharged per wheelchair with son.

## 2012-07-10 ENCOUNTER — Telehealth (HOSPITAL_COMMUNITY): Payer: Self-pay | Admitting: *Deleted

## 2012-07-10 LAB — ANAEROBIC CULTURE

## 2012-07-30 ENCOUNTER — Other Ambulatory Visit: Payer: Self-pay | Admitting: *Deleted

## 2012-07-30 ENCOUNTER — Telehealth: Payer: Self-pay | Admitting: *Deleted

## 2012-07-30 NOTE — Telephone Encounter (Signed)
Per [place], [facility] RN, patient reports intermittent flushing throughout the day (not associated with her vancomycin infusions), occasional chills (WITH vancomycin infusions), and ringing in her ear (once during an infusion last week).  Patient to be seen in the office tomorrow 07/31/12.  RN would like to know if patient should stop therapy today or continue until being seen tomorrow.  Pt went to Dr. Victorino Dike today, he deferred to ID.  Please advise.  Marland KitchenAndree Coss, RN

## 2012-07-31 ENCOUNTER — Encounter: Payer: Self-pay | Admitting: Internal Medicine

## 2012-07-31 ENCOUNTER — Telehealth: Payer: Self-pay | Admitting: Licensed Clinical Social Worker

## 2012-07-31 ENCOUNTER — Ambulatory Visit (HOSPITAL_COMMUNITY)
Admission: RE | Admit: 2012-07-31 | Discharge: 2012-07-31 | Disposition: A | Discharge status: Home / Self Care | Payer: BC Managed Care – PPO | Source: Ambulatory Visit | Attending: Internal Medicine | Admitting: Internal Medicine

## 2012-07-31 ENCOUNTER — Ambulatory Visit (INDEPENDENT_AMBULATORY_CARE_PROVIDER_SITE_OTHER): Payer: BC Managed Care – PPO | Admitting: Internal Medicine

## 2012-07-31 ENCOUNTER — Other Ambulatory Visit: Payer: Self-pay | Admitting: Internal Medicine

## 2012-07-31 VITALS — BP 159/97 | HR 76 | Temp 98.2°F | Wt 215.0 lb

## 2012-07-31 DIAGNOSIS — T8450XD Infection and inflammatory reaction due to unspecified internal joint prosthesis, subsequent encounter: Secondary | ICD-10-CM

## 2012-07-31 DIAGNOSIS — Y831 Surgical operation with implant of artificial internal device as the cause of abnormal reaction of the patient, or of later complication, without mention of misadventure at the time of the procedure: Secondary | ICD-10-CM | POA: Insufficient documentation

## 2012-07-31 DIAGNOSIS — Z96659 Presence of unspecified artificial knee joint: Secondary | ICD-10-CM | POA: Insufficient documentation

## 2012-07-31 DIAGNOSIS — Z5189 Encounter for other specified aftercare: Secondary | ICD-10-CM

## 2012-07-31 DIAGNOSIS — T8450XA Infection and inflammatory reaction due to unspecified internal joint prosthesis, initial encounter: Secondary | ICD-10-CM | POA: Insufficient documentation

## 2012-07-31 LAB — COMPREHENSIVE METABOLIC PANEL
Albumin: 4.3 g/dL (ref 3.5–5.2)
BUN: 10 mg/dL (ref 6–23)
Calcium: 9.9 mg/dL (ref 8.4–10.5)
Chloride: 104 mEq/L (ref 96–112)
Glucose, Bld: 130 mg/dL — ABNORMAL HIGH (ref 70–99)
Potassium: 3.9 mEq/L (ref 3.5–5.3)

## 2012-07-31 LAB — CK: Total CK: 55 U/L (ref 7–177)

## 2012-07-31 NOTE — Procedures (Signed)
Procedure:  PICC line placement Findings:  New 43 cm LUE SL PICC via left brachial vein.  Tip at cavoatrial junction.

## 2012-07-31 NOTE — Telephone Encounter (Signed)
Patient was changed from Vancomycin to Daptomycin today at her office visit due to complaints of chills and night sweats. According to [facility] it would cost the patient 5000.00 out of pocket. Per Dr. Drue Second she can stay on Vancomycin for now and she will monitor her closely. Pharmacist Clair Gulling called the patient shortly after I spoke with her and advised her to premedicate with Tylenol 500 mg before each infusion three times daily and this will help with her infusion reaction she is having.

## 2012-07-31 NOTE — Progress Notes (Signed)
[facility] CL[facility] NOTE  RFV: hospital follow up for left ankle periprosthetic infection s/p HW removal and spacer placement on 3/6,  Cultures negative, on IV vanco and rifampin Subjective:    Patient ID: [name], female    DOB: 07/16/1965, 47 y.o.   MRN: [id number]  HPI [name] is a 47 y.o. female HTN, hx of nephrectomy, as well as history of prior ankle fracture surgery in 2011, and underwent L ankle joint replacement in Aug 2012. She presented to ED on 07/04/12 with acute onset of left ankle and calf pain that is worse with weight bearing but improved at rest. She denies any recent trauma to her ankle, no recent dental work. She denies any overt fevers but had a week of chills, nightsweats. She had slightly elevated sed rate of 26, crp <0.5. She had doppler negative for DVT. Underwent joint aspirate which was concerning for infection due to 7,056 WBC with 90%N. Gram stain showed rare WBC and no organism. She went to OR on 3/6 for removal of poly spacer, I X D, and polyexchange spacer. She was discharged on vancomycin and rifampin for 6 wks to end on April 17th.   Her course has been complicated by leukopenia where WBC trended down to 3 from baseline of 8.7. Also in the last 10 days, she has noticed having chills with each infusion of vancomycin, no rash. But also having nightsweats, but no fevers when she checked with thermometer, no more than 98.5. picc line site appears c/d/i per [facility] assessment.she was asked to stop taking vancomycin on evening of 3/31.she was seen by dr. Victorino Dike in the last few days where her boot was removed, having good recovering of her left ankle.  Current Outpatient Prescriptions on File Prior to Visit  Medication Sig Dispense Refill  . aspirin EC 81 MG tablet Take 81 mg by mouth daily.      Marland Kitchen losartan (COZAAR) 50 MG tablet Take 50 mg by mouth daily.      Marland Kitchen oxyCODONE (OXY IR/ROXICODONE) 5 MG immediate release tablet Take 1-2 tablets (5-10 mg total) by mouth every 4  (four) hours as needed.  30 tablet  0  . rifampin (RIFADIN) 300 MG capsule Take 1 capsule (300 mg total) by mouth every 12 (twelve) hours.  76 capsule  0  . spironolactone (ALDACTONE) 25 MG tablet Take 1 tablet (25 mg total) by mouth daily.  90 tablet  3  . vancomycin (VANCOCIN) 1 GM/200ML SOLN Inject 200 mLs (1,000 mg total) into the vein every 8 (eight) hours.  4000 mL  6  . vitamin E 400 UNIT capsule Take 400 Units by mouth daily.       Marland Kitchen zolpidem (AMBIEN) 10 MG tablet Take 5 mg by mouth at bedtime as needed. For sleep       No current facility-administered medications on file prior to visit.   Active Ambulatory Problems    Diagnosis Date Noted  . HYPERTENSION, BENIGN SYSTEMIC 06/29/2006  . KIDNEY CYST, ACQUIRED 03/19/2007  . Breast ductal hyperplasia, atypical 10/30/2010  . Obesity (BMI 30-39.9) 11/17/2010  . Insomnia 07/04/2011  . Influenza 04/20/2012  . Flank pain 06/06/2012   Resolved Ambulatory Problems    Diagnosis Date Noted  . GASTROESOPHAGEAL REFLUX, NO ESOPHAGITIS 06/29/2006  . Irritable bowel syndrome 06/29/2006  . IRREGULAR MENSES 03/04/2010  . HYPERGLYCEMIA 05/11/2009  . FRACTURE, ANKLE, LEFT 11/11/2009  . DUB (dysfunctional uterine bleeding) 10/30/2010  . Leiomyoma of uterus, unspecified 10/30/2010   Past  Medical History  Diagnosis Date  . Allergy   . Hypertension   . PONV (postoperative nausea and vomiting)   . Chronic kidney disease   . GERD (gastroesophageal reflux disease)   . Headache   . Arthritis    Soc and family hx: reviewed, unchanged since consultation. Pt on leave from work during this period   Review of Systems +chills, nightsweats but no fever.    Objective:   Physical Exam  BP 159/97  Pulse 76  Temp(Src) 98.2 F (36.8 C) (Oral)  Wt 215 lb (97.523 kg)  BMI 33.16 kg/m2 Physical Exam  Constitutional: oriented to person, place, and time.  appears well-developed and well-nourished. No distress.  HENT:  Mouth/Throat: Oropharynx is  clear and moist. No oropharyngeal exudate.  Cardiovascular: Normal rate, regular rhythm and normal heart sounds. Exam reveals no gallop and no friction rub.  No murmur heard.  Pulmonary/Chest: Effort normal and breath sounds normal. No respiratory distress.  no wheezes.  Lymphadenopathy:  no cervical adenopathy.  Ext= trace edema to left ankle. Incision well healed Skin: right upper arm picc line is  warm and dry. No rash noted. No erythema.  Psychiatric:  normal mood and affect. His behavior is normal.         Assessment & Plan:  Possible picc line infection = given that she has chills with infusion that now persists with nightsweats, i am concerned that she may have an underling picc line infection. We will plan on having picc line pulled, and have new line replaced today by IR. Will check blood culture. Patient has IR appt at 1 pm today to change out lines from right arm to left arm.  Late prosthetic joint infection = will change from vancomycin to daptomycin since patient appears to have leukopenia from vancomycin. We will dose at dapto 8mg /kg/day. Will check baseline ck and cmp arrange for dapto through [facility]. Continue with rifampin 300mg  bid.  rtc on 4/17 at completion of 6 wks of IV antibiotics.  40 min spent with patient with greater than 50% spent in coordination of care

## 2012-07-31 NOTE — Telephone Encounter (Signed)
i spoke with home health who asked her to stop taking her iv antibiotics and will see her today

## 2012-08-01 ENCOUNTER — Telehealth (HOSPITAL_COMMUNITY): Payer: Self-pay | Admitting: *Deleted

## 2012-08-06 ENCOUNTER — Telehealth: Payer: Self-pay | Admitting: *Deleted

## 2012-08-06 LAB — CULTURE, BLOOD (SINGLE): Organism ID, Bacteria: NO GROWTH

## 2012-08-06 NOTE — Telephone Encounter (Signed)
[facility], RN would like a call back.  [phone])  Pt continuing with flushing and night sweats.  Taking Tylenol 2x/day.  Next OV 08/21/12 @ 0900.

## 2012-08-13 ENCOUNTER — Telehealth: Payer: Self-pay | Admitting: *Deleted

## 2012-08-13 ENCOUNTER — Telehealth: Payer: Self-pay | Admitting: Licensed Clinical Social Worker

## 2012-08-13 NOTE — Telephone Encounter (Signed)
Patient called to see if she needed to keep her appt for 08/21/12 as she will have her PICC out 08/16/12. Advised her to keep the appt as she may need a few weeks of oral antibiotics. She advised she will be here.

## 2012-08-13 NOTE — Telephone Encounter (Signed)
Verbal order given by Dr. Drue Second that patients IV antibiotics can end on 08/15/2012 her f/u is 08/21/2012

## 2012-08-14 ENCOUNTER — Inpatient Hospital Stay: Payer: BC Managed Care – PPO | Admitting: Internal Medicine

## 2012-08-21 ENCOUNTER — Encounter: Payer: Self-pay | Admitting: Internal Medicine

## 2012-08-21 ENCOUNTER — Inpatient Hospital Stay: Payer: BC Managed Care – PPO | Admitting: Internal Medicine

## 2012-08-21 ENCOUNTER — Ambulatory Visit (INDEPENDENT_AMBULATORY_CARE_PROVIDER_SITE_OTHER): Payer: BC Managed Care – PPO | Admitting: Internal Medicine

## 2012-08-21 VITALS — BP 163/85 | HR 71 | Temp 97.8°F | Wt 220.0 lb

## 2012-08-21 DIAGNOSIS — T889XXS Complication of surgical and medical care, unspecified, sequela: Secondary | ICD-10-CM

## 2012-08-21 DIAGNOSIS — T8450XS Infection and inflammatory reaction due to unspecified internal joint prosthesis, sequela: Secondary | ICD-10-CM

## 2012-08-21 LAB — COMPREHENSIVE METABOLIC PANEL
ALT: 17 U/L (ref 0–35)
AST: 15 U/L (ref 0–37)
CO2: 26 mEq/L (ref 19–32)
Calcium: 9 mg/dL (ref 8.4–10.5)
Chloride: 103 mEq/L (ref 96–112)
Potassium: 4.2 mEq/L (ref 3.5–5.3)
Sodium: 138 mEq/L (ref 135–145)
Total Protein: 7.1 g/dL (ref 6.0–8.3)

## 2012-08-21 LAB — C-REACTIVE PROTEIN: CRP: 0.9 mg/dL — ABNORMAL HIGH (ref <0.60)

## 2012-08-21 LAB — SEDIMENTATION RATE: Sed Rate: 19 mm/hr (ref 0–22)

## 2012-08-21 MED ORDER — RIFAMPIN 300 MG PO CAPS: 300.0000 mg | ORAL_CAPSULE | Freq: Two times a day (BID) | ORAL | Status: DC

## 2012-08-21 MED ORDER — CLINDAMYCIN HCL 300 MG PO CAPS: 300.0000 mg | ORAL_CAPSULE | Freq: Three times a day (TID) | ORAL | Status: DC

## 2012-08-21 NOTE — Progress Notes (Signed)
[facility] NOTE  RFV: hospital follow up for left ankle periprosthetic infection s/p HW removal and spacer placement on 3/6, Cultures negative, on IV vanco and rifampin   Subjective:    Patient ID: [name], female    DOB: April 01, 1966, 47 y.o.   MRN: [id number]  HPI [name] is a 47 y.o. female HTN, hx of nephrectomy, as well as history of prior ankle fracture surgery in 2011, and underwent L ankle joint replacement in Aug 2012. She presented to ED on 07/04/12 with acute onset of left ankle and calf pain that is worse with weight bearing but improved at rest. She denies any recent trauma to her ankle, no recent dental work. She denies any overt fevers but had a week of chills, nightsweats. She had slightly elevated sed rate of 26, crp <0.5. She had doppler negative for DVT. Underwent joint aspirate which was concerning for infection due to 7,056 WBC with 90%N. Gram stain showed rare WBC and no organism. She went to OR on 3/6 for removal of poly spacer, I X D, and polyexchange spacer. She was discharged on vancomycin and rifampin for 6 wks to end on April 17th.   Her course has been complicated by leukopenia where WBC trended down to 3 from baseline of 8.7. Also in the last 10 days, she has noticed having chills with each infusion of vancomycin, no rash. But also having nightsweats, but no fevers when she checked with thermometer, no more than 98.5. picc line site appears c/d/i per [facility] assessment.she was asked to stop taking vancomycin on evening of 3/31, and Finished her IV antibiotics with daptomycin on 4/17. Doing well. No longer having fever, chills will infusions. Home health had pulled out her line on 4/17 when antibiotics had finished. She was transitioned to clindamycin 300mg  BID and continued on rifampin 300mg  BID since 4/18.  Noticing having more movement in her left ankle since surgery. Doing well overall.  Allergies  Allergen Reactions  . Tetracycline Hcl Shortness Of Breath,  Swelling and Other (See Comments)    Large purple bruise  . Septra (Bactrim) Itching, Swelling and Other (See Comments)    Purple bruise  . Sulfamethoxazole W-Trimethoprim Itching, Swelling and Other (See Comments)    Purple bruise   - penicillins are ok. Active Ambulatory Problems    Diagnosis Date Noted  . HYPERTENSION, BENIGN SYSTEMIC 06/29/2006  . KIDNEY CYST, ACQUIRED 03/19/2007  . Breast ductal hyperplasia, atypical 10/30/2010  . Obesity (BMI 30-39.9) 11/17/2010  . Insomnia 07/04/2011  . Influenza 04/20/2012  . Flank pain 06/06/2012   Resolved Ambulatory Problems    Diagnosis Date Noted  . GASTROESOPHAGEAL REFLUX, NO ESOPHAGITIS 06/29/2006  . Irritable bowel syndrome 06/29/2006  . IRREGULAR MENSES 03/04/2010  . HYPERGLYCEMIA 05/11/2009  . FRACTURE, ANKLE, LEFT 11/11/2009  . DUB (dysfunctional uterine bleeding) 10/30/2010  . Leiomyoma of uterus, unspecified 10/30/2010   Past Medical History  Diagnosis Date  . Allergy   . Hypertension   . PONV (postoperative nausea and vomiting)   . Chronic kidney disease   . GERD (gastroesophageal reflux disease)   . Headache   . Arthritis    Social hx and family hx unchanged since last reviewed  Review of Systems Review of Systems  Constitutional: Negative for fever, chills, diaphoresis, activity change, appetite change, fatigue and unexpected weight change.  HENT: Negative for congestion, sore throat, rhinorrhea, sneezing, trouble swallowing and sinus pressure.  Eyes: Negative for photophobia and visual disturbance.  Respiratory: Negative for  cough, chest tightness, shortness of breath, wheezing and stridor.  Cardiovascular: Negative for chest pain, palpitations and leg swelling.  Gastrointestinal: Negative for nausea, vomiting, abdominal pain, diarrhea, constipation, blood in stool, abdominal distention and anal bleeding.  Genitourinary: Negative for dysuria, hematuria, flank pain and difficulty urinating.  Musculoskeletal:  Negative for myalgias, back pain, joint swelling, arthralgias and gait problem.  Skin: Negative for color change, pallor, rash and wound.  Neurological: Negative for dizziness, tremors, weakness and light-headedness.  Hematological: Negative for adenopathy. Does not bruise/bleed easily.  Psychiatric/Behavioral: Negative for behavioral problems, confusion, sleep disturbance, dysphoric mood, decreased concentration and agitation.       Objective:   Physical Exam BP 163/85  Pulse 71  Temp(Src) 97.8 F (36.6 C) (Oral)  Wt 220 lb (99.791 kg)  BMI 33.93 kg/m2 Physical Exam  Constitutional: oriented to person, place, and time. He appears well-developed and well-nourished. No distress.  HENT:  Mouth/Throat: Oropharynx is clear and moist. No oropharyngeal exudate.  Cardiovascular: Normal rate, regular rhythm and normal heart sounds. Exam reveals no gallop and no friction rub.  No murmur heard.  Pulmonary/Chest: Effort normal and breath sounds normal. No respiratory distress.  has no wheezes.   Lymphadenopathy: no cervical adenopathy.  Skin: Skin is warm and dry. No rash noted. No erythema.  Ext: slight swelling to left ankle Psychiatric:  normal mood and affect. behavior is normal.          Assessment & Plan:  Ankle periprosthesis infeciton = check sed rate, crp, and cmp (due to being on rifampin). Will give refills on clindamycin 300mg  TID and rifampin 300mg  BID for an additional 6 wk, until next appt. Which will be at the 3 month mark, end of course of therapy.  Drug monitoring = will check CMP to ensure no transaminitis from rifampin  - rtc in 6 wks

## 2012-08-27 ENCOUNTER — Telehealth: Payer: Self-pay | Admitting: *Deleted

## 2012-08-27 NOTE — Telephone Encounter (Signed)
Verbal order given to d/c patient from [facility] care as she now is on oral antibiotics.  Andree Coss, RN

## 2012-09-07 ENCOUNTER — Other Ambulatory Visit: Payer: Self-pay | Admitting: Family Medicine

## 2012-10-04 ENCOUNTER — Encounter: Payer: Self-pay | Admitting: Internal Medicine

## 2012-10-04 ENCOUNTER — Ambulatory Visit (INDEPENDENT_AMBULATORY_CARE_PROVIDER_SITE_OTHER): Payer: BC Managed Care – PPO | Admitting: Internal Medicine

## 2012-10-04 VITALS — BP 151/75 | HR 66 | Temp 97.9°F | Wt 217.0 lb

## 2012-10-04 DIAGNOSIS — T889XXS Complication of surgical and medical care, unspecified, sequela: Secondary | ICD-10-CM

## 2012-10-04 DIAGNOSIS — T8450XS Infection and inflammatory reaction due to unspecified internal joint prosthesis, sequela: Secondary | ICD-10-CM

## 2012-10-04 LAB — COMPREHENSIVE METABOLIC PANEL
AST: 26 U/L (ref 0–37)
Albumin: 3.8 g/dL (ref 3.5–5.2)
Alkaline Phosphatase: 87 U/L (ref 39–117)
BUN: 6 mg/dL (ref 6–23)
Calcium: 9.1 mg/dL (ref 8.4–10.5)
Creat: 0.66 mg/dL (ref 0.50–1.10)
Glucose, Bld: 76 mg/dL (ref 70–99)
Potassium: 4.1 mEq/L (ref 3.5–5.3)

## 2012-10-04 LAB — C-REACTIVE PROTEIN: CRP: 0.5 mg/dL (ref <0.60)

## 2012-10-04 NOTE — Progress Notes (Signed)
RFV: left ankle periprosthetic joint infection Subjective:    Patient ID: [name], female    DOB: 1965-10-20, 47 y.o.   MRN: [id number]  HPI 47yo F with left ankle periprosthetic ankle infection on clinda and rifampin to finish 3 month therapy, patient underwent i x d on march 6th. Cultures from OR were NGTD because pateint had been on antibiotics. She was treated with IV antibiotics for 6 wks then converted to oral regimen of clinda and rifampin. She will finish up her current bottles of antibiotics in 3 wks.  Working 8hr shifts 7 days/ a week . Noticing left ankle swelling at the end of the day but the swelling goes back to normal when she wakes up it is completely back to normal.  rom on left ankle is better than in the past but not equivalent to unaffected right ankle, not any decrease in rom of left ankle.  No difficulty with antibiotics for now  Allergies  Allergen Reactions  . Tetracycline Hcl Shortness Of Breath, Swelling and Other (See Comments)    Large purple bruise  . Septra (Bactrim) Itching, Swelling and Other (See Comments)    Purple bruise  . Sulfamethoxazole W-Trimethoprim Itching, Swelling and Other (See Comments)    Purple bruise    Current Outpatient Prescriptions on File Prior to Visit  Medication Sig Dispense Refill  . aspirin EC 81 MG tablet Take 81 mg by mouth daily.      . clindamycin (CLEOCIN) 300 MG capsule Take 1 capsule (300 mg total) by mouth 3 (three) times daily.  120 capsule  1  . losartan (COZAAR) 50 MG tablet Take 50 mg by mouth daily.      Marland Kitchen oxyCODONE (OXY IR/ROXICODONE) 5 MG immediate release tablet Take 1-2 tablets (5-10 mg total) by mouth every 4 (four) hours as needed.  30 tablet  0  . rifampin (RIFADIN) 300 MG capsule Take 1 capsule (300 mg total) by mouth 2 (two) times daily.  60 capsule  1  . spironolactone (ALDACTONE) 25 MG tablet TAKE 1 TABLET (25 MG TOTAL) BY MOUTH DAILY.  90 tablet  3  . vitamin E 400 UNIT capsule Take 400 Units by  mouth daily.       Marland Kitchen zolpidem (AMBIEN) 10 MG tablet Take 5 mg by mouth at bedtime as needed. For sleep       No current facility-administered medications on file prior to visit.   Active Ambulatory Problems    Diagnosis Date Noted  . HYPERTENSION, BENIGN SYSTEMIC 06/29/2006  . KIDNEY CYST, ACQUIRED 03/19/2007  . Breast ductal hyperplasia, atypical 10/30/2010  . Obesity (BMI 30-39.9) 11/17/2010  . Insomnia 07/04/2011  . Influenza 04/20/2012  . Flank pain 06/06/2012   Resolved Ambulatory Problems    Diagnosis Date Noted  . GASTROESOPHAGEAL REFLUX, NO ESOPHAGITIS 06/29/2006  . Irritable bowel syndrome 06/29/2006  . IRREGULAR MENSES 03/04/2010  . HYPERGLYCEMIA 05/11/2009  . FRACTURE, ANKLE, LEFT 11/11/2009  . DUB (dysfunctional uterine bleeding) 10/30/2010  . Leiomyoma of uterus, unspecified 10/30/2010   Past Medical History  Diagnosis Date  . Allergy   . Hypertension   . PONV (postoperative nausea and vomiting)   . Chronic kidney disease   . GERD (gastroesophageal reflux disease)   . Headache(784.0)   . Arthritis        Review of Systems 14 ROS is negative other than some swelling to affected ankle after long hours at work    Objective:   Physical Exam BP 151/75  Pulse 66  Temp(Src) 97.9 F (36.6 C) (Oral)  Wt 217 lb (98.431 kg)  BMI 33.47 kg/m2 Physical Exam  Constitutional:  oriented to person, place, and time. appears well-developed and well-nourished. No distress.  HENT:  Mouth/Throat: Oropharynx is clear and moist. No oropharyngeal exudate.  Cardiovascular: Normal rate, regular rhythm and normal heart sounds. Exam reveals no gallop and no friction rub.  No murmur heard.  Pulmonary/Chest: Effort normal and breath sounds normal. No respiratory distress.  no wheezes.  Abdominal: Soft. Bowel sounds are normal. He exhibits no distension. There is no tenderness.  Lymphadenopathy: no cervical adenopathy.  Neurological:  alert and oriented to person, place, and  time.  Skin: Skin is warm and dry. No rash noted. No erythema.  Psychiatric:  normal mood and affect.  behavior is normal.   Labs:  Lab Results  Component Value Date   ESRSEDRATE 19 08/21/2012   Lab Results  Component Value Date   CRP 0.9* 08/21/2012   Lab Results  Component Value Date   WBC 9.9 07/05/2012   HGB 12.6 07/05/2012   HCT 34.6* 07/05/2012   MCV 78.3 07/05/2012   PLT 257 07/05/2012   BMET    Component Value Date/Time   NA 138 08/21/2012 0931   NA 136 03/18/2009 1439   K 4.2 08/21/2012 0931   K 3.0* 03/18/2009 1439   CL 103 08/21/2012 0931   CL 99 03/18/2009 1439   CO2 26 08/21/2012 0931   CO2 29 03/18/2009 1439   GLUCOSE 102* 08/21/2012 0931   GLUCOSE 97 03/18/2009 1439   BUN 7 08/21/2012 0931   BUN 13 03/18/2009 1439   CREATININE 0.72 08/21/2012 0931   CREATININE 0.87 07/08/2012 0522   CALCIUM 9.0 08/21/2012 0931   CALCIUM 8.9 03/18/2009 1439   GFRNONAA 78* 07/08/2012 0522   GFRAA 90* 07/08/2012 0522           Assessment & Plan:  Left ankle prosthetic joint infection = finish up clinda and rifampin to complete 3 month course of therapy. Will check sed rate, crp, cmp  Lab Results  Component Value Date   ESRSEDRATE 7 10/04/2012   Lab Results  Component Value Date   CRP 0.5 10/04/2012   Appear normal. Reassuring to finish up oral suppressive therapy. No longer needs further continuation after she finishes the current bottle.

## 2012-10-30 ENCOUNTER — Other Ambulatory Visit: Payer: Self-pay | Admitting: Family Medicine

## 2012-10-30 DIAGNOSIS — N63 Unspecified lump in unspecified breast: Secondary | ICD-10-CM

## 2012-11-09 ENCOUNTER — Ambulatory Visit
Admission: RE | Admit: 2012-11-09 | Discharge: 2012-11-09 | Disposition: A | Discharge status: Home / Self Care | Payer: BC Managed Care – PPO | Source: Ambulatory Visit | Attending: Family Medicine | Admitting: Family Medicine

## 2012-11-09 DIAGNOSIS — N63 Unspecified lump in unspecified breast: Secondary | ICD-10-CM

## 2012-11-16 ENCOUNTER — Encounter (HOSPITAL_COMMUNITY): Payer: Self-pay | Admitting: *Deleted

## 2012-11-16 DIAGNOSIS — M19079 Primary osteoarthritis, unspecified ankle and foot: Secondary | ICD-10-CM | POA: Insufficient documentation

## 2012-11-16 DIAGNOSIS — R509 Fever, unspecified: Secondary | ICD-10-CM | POA: Insufficient documentation

## 2012-11-16 DIAGNOSIS — Z79899 Other long term (current) drug therapy: Secondary | ICD-10-CM | POA: Insufficient documentation

## 2012-11-16 DIAGNOSIS — R197 Diarrhea, unspecified: Secondary | ICD-10-CM | POA: Insufficient documentation

## 2012-11-16 DIAGNOSIS — Z8719 Personal history of other diseases of the digestive system: Secondary | ICD-10-CM | POA: Insufficient documentation

## 2012-11-16 DIAGNOSIS — I129 Hypertensive chronic kidney disease with stage 1 through stage 4 chronic kidney disease, or unspecified chronic kidney disease: Secondary | ICD-10-CM | POA: Insufficient documentation

## 2012-11-16 DIAGNOSIS — Z7982 Long term (current) use of aspirin: Secondary | ICD-10-CM | POA: Insufficient documentation

## 2012-11-16 DIAGNOSIS — R1033 Periumbilical pain: Secondary | ICD-10-CM | POA: Insufficient documentation

## 2012-11-16 DIAGNOSIS — R110 Nausea: Secondary | ICD-10-CM | POA: Insufficient documentation

## 2012-11-16 DIAGNOSIS — N189 Chronic kidney disease, unspecified: Secondary | ICD-10-CM | POA: Insufficient documentation

## 2012-11-16 NOTE — ED Notes (Signed)
Pt reports regularly having swelling to left ankle but was also running low grade fever today of 100.2. Reports having artificial joint placed left ankle one year ago and has problems with infection, etc. Has been off antibiotics for one month. Afebrile at triage.

## 2012-11-17 ENCOUNTER — Emergency Department (HOSPITAL_COMMUNITY)
Admission: EM | Admit: 2012-11-17 | Discharge: 2012-11-17 | Disposition: A | Discharge status: Home / Self Care | Payer: BC Managed Care – PPO | Attending: Emergency Medicine | Admitting: Emergency Medicine

## 2012-11-17 DIAGNOSIS — R197 Diarrhea, unspecified: Secondary | ICD-10-CM

## 2012-11-17 LAB — CBC WITH DIFFERENTIAL/PLATELET
Basophils Relative: 0 % (ref 0–1)
HCT: 36.9 % (ref 36.0–46.0)
Hemoglobin: 12.9 g/dL (ref 12.0–15.0)
Lymphs Abs: 2 10*3/uL (ref 0.7–4.0)
MCH: 27.1 pg (ref 26.0–34.0)
MCHC: 35 g/dL (ref 30.0–36.0)
Monocytes Absolute: 0.7 10*3/uL (ref 0.1–1.0)
Monocytes Relative: 10 % (ref 3–12)
Neutro Abs: 4.2 10*3/uL (ref 1.7–7.7)
Neutrophils Relative %: 60 % (ref 43–77)
RBC: 4.76 MIL/uL (ref 3.87–5.11)

## 2012-11-17 LAB — BASIC METABOLIC PANEL
BUN: 8 mg/dL (ref 6–23)
Chloride: 103 mEq/L (ref 96–112)
Creatinine, Ser: 0.7 mg/dL (ref 0.50–1.10)
Glucose, Bld: 112 mg/dL — ABNORMAL HIGH (ref 70–99)
Potassium: 3.6 mEq/L (ref 3.5–5.1)

## 2012-11-17 MED ORDER — ONDANSETRON HCL 4 MG/2ML IJ SOLN
4.0000 mg | Freq: Once | INTRAMUSCULAR | Status: AC
  Administered 2012-11-17: 4 mg via INTRAVENOUS
  Filled 2012-11-17: qty 2

## 2012-11-17 MED ORDER — LOPERAMIDE HCL 2 MG PO CAPS
4.0000 mg | ORAL_CAPSULE | Freq: Once | ORAL | Status: AC
  Administered 2012-11-17: 4 mg via ORAL
  Filled 2012-11-17: qty 2

## 2012-11-17 MED ORDER — ONDANSETRON HCL 4 MG PO TABS: 4.0000 mg | ORAL_TABLET | Freq: Four times a day (QID) | ORAL | Status: DC | PRN

## 2012-11-17 MED ORDER — SODIUM CHLORIDE 0.9 % IV SOLN: 1000.0000 mL | Freq: Once | INTRAVENOUS | Status: DC

## 2012-11-17 MED ORDER — SODIUM CHLORIDE 0.9 % IV SOLN
1000.0000 mL | INTRAVENOUS | Status: DC
  Administered 2012-11-17: 1000 mL via INTRAVENOUS

## 2012-11-17 NOTE — ED Provider Notes (Signed)
History    CSN: [id number] Arrival date & time 11/16/12  1804  First MD Initiated Contact with Patient 11/17/12 0021     Chief Complaint  Patient presents with  . Joint Swelling   (Consider location/radiation/quality/duration/timing/severity/associated sxs/prior Treatment) The history is provided by the patient.   47 year old female has had diarrhea for the last 2 days which got worse today. There is associated nausea. There's also crampy periumbilical pain associated with it. Pain is rated an 8/10. Nothing makes it better nothing makes it worse. She had a low-grade fever at home today which is up to 100.2. She denies chills or sweats. She denies dizziness. Of note, she had been on an extended course of antibiotics for an infected prosthetic ankle joint and she's been off antibiotics for about 3 weeks. Her ankle is swollen tonight but she has been having difficulty with ankle swelling ever since surgery to replace the plastic portion of her ankle prosthesis. Her swelling today it is not out of the ordinary. Past Medical History  Diagnosis Date  . Allergy   . Hypertension   . PONV (postoperative nausea and vomiting)     N/V and trouble urinating after surgery   . Chronic kidney disease     right renal cyst   . GERD (gastroesophageal reflux disease)     hx of no problems at present   . Headache(784.0)     occasional headache   . Arthritis     Lt ankle   Past Surgical History  Procedure Laterality Date  . Breast surgery  2010    lumpectomy Lt breast  . Tubal ligation  1988  . Fracture surgery  2011    Lt ankle surgeries x 3   . Joint replacement      left ankle 8/12   . Other surgical history      2 hand surgeries on left and shoulder surgery on right shoulder   . Endometrial ablation  October 18 2010    at [facility]  . Laparoscopic nephrectomy  03/09/2011    Procedure: LAPAROSCOPIC NEPHRECTOMY;  Surgeon: Marcine Matar;  Location: [facility];  Service: Urology;  Laterality: Right;   Laparoscopic Assisted Removal Of Renal Cyst   . Total ankle arthroplasty Left 07/05/2012    Procedure: Removal of total ankle implants,I & D with insertion of  new poly spacer;  Surgeon: Toni Arthurs, MD;  Location: [facility];  Service: Orthopedics;  Laterality: Left;   Family History  Problem Relation Age of Onset  . Diabetes Mother   . Heart disease Mother   . Hypertension Father   . Diabetes Sister   . Kidney disease Sister   . Hypertension Brother   . Kidney disease Brother   . Arthritis Brother   . Cancer Maternal Grandmother   . Diabetes Sister   . Hypertension Sister   . Hypertension Sister   . Hypertension Sister   . Hypertension Brother   . Mental illness Brother    History  Substance Use Topics  . Smoking status: Never Smoker   . Smokeless tobacco: Not on file  . Alcohol Use: No   OB History   Grav Para Term Preterm Abortions TAB SAB Ect Mult Living                 Review of Systems  All other systems reviewed and are negative.    Allergies  Tetracycline hcl; Septra; and Sulfamethoxazole w-trimethoprim  Home Medications   Current Outpatient Rx  Name  Route  Sig  Dispense  Refill  . aspirin EC 81 MG tablet   Oral   Take 81 mg by mouth daily.         Marland Kitchen losartan (COZAAR) 50 MG tablet   Oral   Take 50 mg by mouth daily.         Marland Kitchen oxyCODONE (OXY IR/ROXICODONE) 5 MG immediate release tablet   Oral   Take 1-2 tablets (5-10 mg total) by mouth every 4 (four) hours as needed.   30 tablet   0   . spironolactone (ALDACTONE) 25 MG tablet      TAKE 1 TABLET (25 MG TOTAL) BY MOUTH DAILY.   90 tablet   3   . vitamin E 400 UNIT capsule   Oral   Take 400 Units by mouth daily.           BP 137/63  Pulse 66  Temp(Src) 98.6 F (37 C) (Oral)  Resp 16  SpO2 100% Physical Exam  Nursing note and vitals reviewed.  47 year old female, resting comfortably and in no acute distress. Vital signs are normal. Oxygen saturation is 100%, which is normal. Head is  normocephalic and atraumatic. PERRLA, EOMI. Oropharynx is clear. Neck is nontender and supple without adenopathy or JVD. Back is nontender and there is no CVA tenderness. Lungs are clear without rales, wheezes, or rhonchi. Chest is nontender. Heart has regular rate and rhythm without murmur. Abdomen is soft, flat, nontender without masses or hepatosplenomegaly and peristalsis is hypoactive. Extremities have no cyanosis or edema, full range of motion is present. Moderate swelling is present in the left ankle without erythema or warmth. Skin is warm and dry without rash. Neurologic: Mental status is normal, cranial nerves are intact, there are no motor or sensory deficits.  ED Course  Procedures (including critical care time) Results for orders placed during the hospital encounter of 11/17/12  CBC WITH DIFFERENTIAL      Result Value Range   WBC 7.0  4.0 - 10.5 K/uL   RBC 4.76  3.87 - 5.11 MIL/uL   Hemoglobin 12.9  12.0 - 15.0 g/dL   HCT 19.1  47.8 - 29.5 %   MCV 77.5 (*) 78.0 - 100.0 fL   MCH 27.1  26.0 - 34.0 pg   MCHC 35.0  30.0 - 36.0 g/dL   RDW 62.1  30.8 - 65.7 %   Platelets 230  150 - 400 K/uL   Neutrophils Relative % 60  43 - 77 %   Neutro Abs 4.2  1.7 - 7.7 K/uL   Lymphocytes Relative 29  12 - 46 %   Lymphs Abs 2.0  0.7 - 4.0 K/uL   Monocytes Relative 10  3 - 12 %   Monocytes Absolute 0.7  0.1 - 1.0 K/uL   Eosinophils Relative 1  0 - 5 %   Eosinophils Absolute 0.1  0.0 - 0.7 K/uL   Basophils Relative 0  0 - 1 %   Basophils Absolute 0.0  0.0 - 0.1 K/uL  BASIC METABOLIC PANEL      Result Value Range   Sodium 137  135 - 145 mEq/L   Potassium 3.6  3.5 - 5.1 mEq/L   Chloride 103  96 - 112 mEq/L   CO2 26  19 - 32 mEq/L   Glucose, Bld 112 (*) 70 - 99 mg/dL   BUN 8  6 - 23 mg/dL   Creatinine, Ser 8.46  0.50 - 1.10 mg/dL  Calcium 9.0  8.4 - 10.5 mg/dL   GFR calc non Af Amer >90  >90 mL/min   GFR calc Af Amer >90  >90 mL/min     1. Diarrhea     MDM  Nausea and  diarrhea in a pattern that seems most consistent with a viral enteritis. However, given her recent extended course of antibiotics, need to consider possibility of Clostridium difficile infection. Stool will be sent for Clostridium difficile. In the meantime, she is given IV fluids and IV ondansetron for nausea and oral loperamide for diarrhea.  She feels significantly better after above noted treatment. Laboratory workup is unremarkable. She is given a prescription for ondansetron for nausea and is advised to use over-the-counter loperamide for diarrhea and is to followup with her PCP she was unable to give a stool sample for evaluation for Clostridium difficile.  Dione Booze, MD 11/17/12 [phone]

## 2012-12-11 ENCOUNTER — Ambulatory Visit (INDEPENDENT_AMBULATORY_CARE_PROVIDER_SITE_OTHER): Payer: BC Managed Care – PPO | Admitting: Family Medicine

## 2012-12-11 ENCOUNTER — Encounter: Payer: Self-pay | Admitting: Family Medicine

## 2012-12-11 ENCOUNTER — Ambulatory Visit (HOSPITAL_COMMUNITY)
Admission: RE | Admit: 2012-12-11 | Discharge: 2012-12-11 | Disposition: A | Discharge status: Home / Self Care | Payer: BC Managed Care – PPO | Source: Ambulatory Visit | Attending: Family Medicine | Admitting: Family Medicine

## 2012-12-11 VITALS — BP 128/85 | HR 57 | Temp 98.5°F | Ht 68.0 in | Wt 215.0 lb

## 2012-12-11 DIAGNOSIS — R002 Palpitations: Secondary | ICD-10-CM | POA: Insufficient documentation

## 2012-12-11 LAB — LIPID PANEL
HDL: 40 mg/dL (ref >39)
LDL Cholesterol: 77 mg/dL (ref 0–99)
Total CHOL/HDL Ratio: 3.4 Ratio
Triglycerides: 102 mg/dL (ref <150)
VLDL: 20 mg/dL (ref 0–40)

## 2012-12-11 LAB — POCT GLYCOSYLATED HEMOGLOBIN (HGB A1C): Hemoglobin A1C: 5.9

## 2012-12-11 NOTE — Progress Notes (Signed)
Subjective:     Patient ID: [name], female   DOB: [birth year], 47 y.o.   MRN: [id number]  HPI 47 year-old [place] American female noon-smoker with history of hypertension and insomnia presents for office visit to discuss the following:   #1 diarrhea illness: Patient with the ED last month with diarrhea. Her diarrhea has since resolved. The presumptive diagnosis either viral colitis versus drug induced diarrhea from rifampin and clindamycin that she took for periprosthetic left ankle infection.  #2 palpitations: Over the past one to 2 weeks patient endorses episodes of palpitations associated with shortness of breath. The episodes occur 1-2 times daily and last for about 10 minutes. Rarely the episodes are associated with sweating and decreasing to be. Patient endorses chronic nausea. Patient endorses stress related to job and family.  She denies chest pain.  CAD risk factors: Personal history of hypertension. Family history of heart attack her mother first one age 41. Personal history of heart attack her brother. Negative: Negative for diabetes, thyroid disorder, hyperlipidemia.  Review of Systems As per HPI Neuro: Intermittent left temple throbbing headaches. No headache currently.     Objective:   Physical Exam BP 128/85  Pulse 57  Temp(Src) 98.5 F (36.9 C) (Oral)  Ht 5\' 8"  (1.727 m)  Wt 215 lb (97.523 kg)  BMI 32.7 kg/m2  SpO2 100% General appearance: alert, cooperative and no distress Neck: no adenopathy, no carotid bruit, no JVD, supple, symmetrical, trachea midline and thyroid not enlarged, symmetric, no tenderness/mass/nodules Lungs: clear to auscultation bilaterally Heart: regular rate and rhythm, S1, S2 normal, no murmur, click, rub or gallop Extremities: trace LE edema b/l. L ankle swollen and well healed anterior surgical scar. extremities normal, atraumatic, no cyanosis.   EKG: unchanged from previous tracings, sinus bradycardia, and HR 52.      Assessment and  Plan:

## 2012-12-11 NOTE — Addendum Note (Signed)
Addended by: [place], Mayce Noyes on: 12/11/2012 05:19 PM   Modules accepted: Orders

## 2012-12-11 NOTE — Assessment & Plan Note (Signed)
A: Patient with palpitations and shortness of breath in the setting was persistent sinus bradycardia. There are no ischemic changes on EKG. She does endorse anxiety and increased stress related to work and family. P: Check TSH, lipids, A1c to further risk stratify and rule out thyroid disorder. Referral to cardiology for stress testing, patient will not be able to perform a simple exercise stress test due to her left ankle prosthesis.

## 2012-12-11 NOTE — Patient Instructions (Addendum)
Ms. Bunker,  Thank you very much for coming in to see me today. Your EKG  revealed persistent sinus bradycardia. Please continue all current medications as prescribed. My staff will be in touch with you with your blood work results.  My staff will be in touch with you with your cardiology appointment. In the meantime do anything he can to relax. If you develop palpitations associated with passing out or significant chest pain please call and seek immediate medical attention.  Dr. Armen Pickup

## 2012-12-13 ENCOUNTER — Telehealth: Payer: Self-pay | Admitting: Family Medicine

## 2012-12-13 NOTE — Telephone Encounter (Signed)
Advised pt of all of Dr Armen Pickup recommendations

## 2012-12-13 NOTE — Telephone Encounter (Signed)
Message copied by Barnie Alderman on Thu Dec 13, 2012  9:06 AM ------      Message from: Dessa Phi      Created: Thu Dec 13, 2012  8:09 AM       Normal lipid panel and TSH.      Please inform patient.      Continue all current medications.       Keep cardiology referral for stress test.      F/u with PCP post stress test or sooner if needed. ------

## 2012-12-29 ENCOUNTER — Other Ambulatory Visit: Payer: Self-pay | Admitting: Family Medicine

## 2013-01-23 ENCOUNTER — Encounter (HOSPITAL_COMMUNITY): Payer: Self-pay | Admitting: Emergency Medicine

## 2013-01-23 ENCOUNTER — Emergency Department (INDEPENDENT_AMBULATORY_CARE_PROVIDER_SITE_OTHER)
Admission: EM | Admit: 2013-01-23 | Discharge: 2013-01-23 | Disposition: A | Discharge status: Home / Self Care | Payer: BC Managed Care – PPO | Source: Home / Self Care

## 2013-01-23 DIAGNOSIS — M25579 Pain in unspecified ankle and joints of unspecified foot: Secondary | ICD-10-CM

## 2013-01-23 DIAGNOSIS — M25572 Pain in left ankle and joints of left foot: Secondary | ICD-10-CM

## 2013-01-23 NOTE — ED Provider Notes (Signed)
CSN: [id number]     Arrival date & time 01/23/13  1545 History   First MD Initiated Contact with Patient 01/23/13 1738     Chief Complaint  Patient presents with  . Ankle Pain   (Consider location/radiation/quality/duration/timing/severity/associated sxs/prior Treatment) HPI Comments: 47 year old female with a history of several left ankle surgeries due to "arthritis in tendon injury". In March of 2014 she was having pain, which emergency department and was advised that she had infection which resulted in her fourth surgery. Her job entails that she is standing and walking most of the day. This causes constant ankle swelling. She states the swelling that she has now is no different from any other previous day. Today she has experienced pain in the ankle particularly along the lower ankle and heel. Repeat states the pain is becoming unbearable. She denies trauma or other injury that would overextend the tendons such as twisting, turning, inversion or eversion. No blunt trauma. She wants to make sure that there is nothing out of place today.  Patient is a 47 y.o. female presenting with ankle pain.  Ankle Pain Associated symptoms: no fever     Past Medical History  Diagnosis Date  . Allergy   . Hypertension   . PONV (postoperative nausea and vomiting)     N/V and trouble urinating after surgery   . Chronic kidney disease     right renal cyst   . GERD (gastroesophageal reflux disease)     hx of no problems at present   . Headache(784.0)     occasional headache   . Arthritis     Lt ankle  . Influenza 04/20/2012   Past Surgical History  Procedure Laterality Date  . Breast surgery  2010    lumpectomy Lt breast  . Tubal ligation  1988  . Fracture surgery  2011    Lt ankle surgeries x 3   . Joint replacement      left ankle 8/12   . Other surgical history      2 hand surgeries on left and shoulder surgery on right shoulder   . Endometrial ablation  October 18 2010    at [facility]  .  Laparoscopic nephrectomy  03/09/2011    Procedure: LAPAROSCOPIC NEPHRECTOMY;  Surgeon: Marcine Matar;  Location: [facility];  Service: Urology;  Laterality: Right;  Laparoscopic Assisted Removal Of Renal Cyst   . Total ankle arthroplasty Left 07/05/2012    Procedure: Removal of total ankle implants,I & D with insertion of  new poly spacer;  Surgeon: Toni Arthurs, MD;  Location: [facility];  Service: Orthopedics;  Laterality: Left;   Family History  Problem Relation Age of Onset  . Diabetes Mother   . Heart disease Mother   . Hypertension Father   . Diabetes Sister   . Kidney disease Sister   . Hypertension Brother   . Kidney disease Brother   . Arthritis Brother   . Cancer Maternal Grandmother   . Diabetes Sister   . Hypertension Sister   . Hypertension Sister   . Hypertension Sister   . Hypertension Brother   . Mental illness Brother    History  Substance Use Topics  . Smoking status: Never Smoker   . Smokeless tobacco: Not on file  . Alcohol Use: No   OB History   Grav Para Term Preterm Abortions TAB SAB Ect Mult Living                 Review of Systems  Constitutional: Negative for fever, chills and activity change.  HENT: Negative.   Respiratory: Negative.   Cardiovascular: Negative.   Musculoskeletal:       As per HPI  Skin: Negative for color change, pallor and rash.  Neurological: Negative.     Allergies  Tetracycline hcl; Septra; and Sulfamethoxazole w-trimethoprim  Home Medications   Current Outpatient Rx  Name  Route  Sig  Dispense  Refill  . aspirin EC 81 MG tablet   Oral   Take 81 mg by mouth daily.         Marland Kitchen losartan (COZAAR) 50 MG tablet   Oral   Take 50 mg by mouth daily.         Marland Kitchen losartan (COZAAR) 50 MG tablet      TAKE 1 TABLET BY MOUTH EVERY DAY   90 tablet   0   . spironolactone (ALDACTONE) 25 MG tablet      TAKE 1 TABLET (25 MG TOTAL) BY MOUTH DAILY.   90 tablet   3   . vitamin E 400 UNIT capsule   Oral   Take 400 Units by  mouth daily.           BP 146/80  Pulse 52  Temp(Src) 98.7 F (37.1 C) (Oral)  Resp 16  SpO2 100% Physical Exam  Nursing note and vitals reviewed. Constitutional: She is oriented to person, place, and time. She appears well-developed and well-nourished. No distress.  HENT:  Head: Normocephalic and atraumatic.  Eyes: EOM are normal. Pupils are equal, round, and reactive to light.  Neck: Normal range of motion. Neck supple.  Cardiovascular: Normal rate.   Pulmonary/Chest: Effort normal.  Musculoskeletal:  There is bimalleolar swelling to the ankle. No edema to the foot. The patient states that this edema is normal for her. Range of motion the ankle is limited from previous injury/surgery. Tenderness to the posterior heel and lateral ankle. No deformity. No erythema or other discoloration. No lymphangitis. Distal neurovascular and sensory is intact.  Lymphadenopathy:    She has no cervical adenopathy.  Neurological: She is alert and oriented to person, place, and time. No cranial nerve deficit.  Skin: Skin is warm and dry.  Psychiatric: She has a normal mood and affect.    ED Course  Procedures (including critical care time) Labs Review Labs Reviewed - No data to display Imaging Review No results found.  MDM   1. Left ankle pain      After I ordered the x-ray the patient told the MA that she no longer wants the x-ray. She wants to leave because she cannot wait any longer. She was advised to wear a brace and we could but that all for her. She says she has a brace at home she will apply it and followup with her orthopedist later. Patient is leaving prior to  additional evaluation or conclusions based on objective data to any new changes in the condition of her ankle.  Hayden Rasmussen, NP 01/23/13 [phone]

## 2013-01-23 NOTE — ED Notes (Addendum)
Pt c/o left ankle pain onset 2 weeks Hx of ankle surgery and has had some hardware replaced back in march due to infection Sxs include: swelling, burning sensation, pain that increases w/activity Denies inj/trauma... Constantly on her feet at work.  She is alert w/no signs of acute distress.

## 2013-01-23 NOTE — ED Notes (Signed)
Pt wanted to be d/c and did not want the x-ray.. Reports she'll wait till she sees her PCP... notified Hayden Rasmussen, NP

## 2013-01-23 NOTE — ED Provider Notes (Signed)
Medical screening examination/treatment/procedure(s) were performed by resident physician or non-physician practitioner and as supervising physician I was immediately available for consultation/collaboration.   Brodi Kari DOUGLAS MD.   Ezinne Yogi D Nyrie Sigal, MD 01/23/13 2044 

## 2013-01-29 ENCOUNTER — Ambulatory Visit (INDEPENDENT_AMBULATORY_CARE_PROVIDER_SITE_OTHER): Payer: BC Managed Care – PPO | Admitting: Cardiovascular Disease

## 2013-01-29 ENCOUNTER — Encounter: Payer: Self-pay | Admitting: Cardiovascular Disease

## 2013-01-29 VITALS — BP 142/85 | HR 55 | Ht 68.0 in | Wt 218.6 lb

## 2013-01-29 DIAGNOSIS — R0600 Dyspnea, unspecified: Secondary | ICD-10-CM

## 2013-01-29 DIAGNOSIS — I10 Essential (primary) hypertension: Secondary | ICD-10-CM

## 2013-01-29 DIAGNOSIS — I498 Other specified cardiac arrhythmias: Secondary | ICD-10-CM

## 2013-01-29 DIAGNOSIS — R002 Palpitations: Secondary | ICD-10-CM

## 2013-01-29 DIAGNOSIS — R0609 Other forms of dyspnea: Secondary | ICD-10-CM

## 2013-01-29 DIAGNOSIS — R001 Bradycardia, unspecified: Secondary | ICD-10-CM

## 2013-01-29 DIAGNOSIS — R0989 Other specified symptoms and signs involving the circulatory and respiratory systems: Secondary | ICD-10-CM

## 2013-01-29 NOTE — Assessment & Plan Note (Signed)
Well controlled.  Continue current medications and low sodium Dash type diet.    

## 2013-01-29 NOTE — Assessment & Plan Note (Signed)
Benign sounding with normal exam and ECG  F/U event monitor Echo to r/o structural heart disease

## 2013-01-29 NOTE — Assessment & Plan Note (Signed)
Seems functional with being overweight and no regular exercise.  Echo

## 2013-01-29 NOTE — Progress Notes (Signed)
**Note Veronica-Identified via Obfuscation** Patient ID: [name], female   DOB: 02-27-1966, 47 y.o.   MRN: [id number] 47 yo referred for palpitations by Dr [facility]. Long standing HTN on Rx.  This includes diuretic  Recent BMET and TSH normal Compliant with meds.  Reviewed ECG;s in EPIC.  Normal with nomral QT, intervals.  Sinus arrhythmia with some variation in R-R.  Over last few months feels palpitations only when laying done.  Works as a Glass blower/designer at [employer] and his no palpitations while walking or working.  No chest pain Mild exertional dyspnea.  Exercise limitations from right ankle prosthesis.   No history of CAD No chest pain or frank syncope.  HR;s tend to be on low side with no AV nodal blocking drugs    ROS: Denies fever, malais, weight loss, blurry vision, decreased visual acuity, cough, sputum, SOB, hemoptysis, pleuritic pain, palpitaitons, heartburn, abdominal pain, melena, lower extremity edema, claudication, or rash.  All other systems reviewed and negative   General: Affect appropriate Healthy appearing black female HEENT: normal Neck supple with no adenopathy JVP normal no bruits no thyromegaly Lungs clear with no wheezing and good diaphragmatic motion Heart:  S1/S2 splits normally  no murmur,rub, gallop or click PMI normal Abdomen: benighn, BS positve, no tenderness, no AAA no bruit.  No HSM or HJR Distal pulses intact with no bruits No edema Neuro non-focal Skin warm and dry No muscular weakness  Medications Current Outpatient Prescriptions  Medication Sig Dispense Refill  . aspirin EC 81 MG tablet Take 81 mg by mouth daily.      Marland Kitchen losartan (COZAAR) 50 MG tablet TAKE 1 TABLET BY MOUTH EVERY DAY  90 tablet  0  . spironolactone (ALDACTONE) 25 MG tablet TAKE 1 TABLET (25 MG TOTAL) BY MOUTH DAILY.  90 tablet  3  . vitamin E 400 UNIT capsule Take 400 Units by mouth daily.        No current facility-administered medications for this visit.    Allergies Tetracycline hcl; Septra; and  Sulfamethoxazole-trimethoprim  Family History: Family History  Problem Relation Age of Onset  . Diabetes Mother   . Heart disease Mother   . Hypertension Father   . Diabetes Sister   . Kidney disease Sister   . Hypertension Brother   . Kidney disease Brother   . Arthritis Brother   . Cancer Maternal Grandmother   . Diabetes Sister   . Hypertension Sister   . Hypertension Sister   . Hypertension Sister   . Hypertension Brother   . Mental illness Brother     Social History: History   Social History  . Marital Status: Divorced    Spouse Name: N/A    Number of Children: N/A  . Years of Education: N/A   Occupational History  . Not on file.   Social History Main Topics  . Smoking status: Never Smoker   . Smokeless tobacco: Not on file  . Alcohol Use: No  . Drug Use: No  . Sexual Activity: No   Other Topics Concern  . Not on file   Social History Narrative  . No narrative on file    Electrocardiogram:  12/11/12  SR rate 52 normal other than sinus arrhythmia   Assessment and Plan

## 2013-01-29 NOTE — Patient Instructions (Addendum)
Your physician has recommended that you wear a 21 day event monitor. Event monitors are medical devices that record the heart's electrical activity. Doctors most often [place] these monitors to diagnose arrhythmias. Arrhythmias are problems with the speed or rhythm of the heartbeat. The monitor is a small, portable device. You can wear one while you do your normal daily activities. This is usually used to diagnose what is causing palpitations/syncope (passing out).  Your physician has requested that you have an echocardiogram. Echocardiography is a painless test that uses sound waves to create images of your heart. It provides your doctor with information about the size and shape of your heart and how well your heart's chambers and valves are working. This procedure takes approximately one hour. There are no restrictions for this procedure.  Your physician recommends that you schedule a follow-up appointment in: as needed basis

## 2013-01-31 ENCOUNTER — Encounter (INDEPENDENT_AMBULATORY_CARE_PROVIDER_SITE_OTHER): Payer: BC Managed Care – PPO

## 2013-01-31 ENCOUNTER — Encounter: Payer: Self-pay | Admitting: Radiology

## 2013-01-31 DIAGNOSIS — R0600 Dyspnea, unspecified: Secondary | ICD-10-CM

## 2013-01-31 DIAGNOSIS — R001 Bradycardia, unspecified: Secondary | ICD-10-CM

## 2013-01-31 DIAGNOSIS — R002 Palpitations: Secondary | ICD-10-CM

## 2013-01-31 DIAGNOSIS — M6789 Other specified disorders of synovium and tendon, multiple sites: Secondary | ICD-10-CM

## 2013-01-31 NOTE — Progress Notes (Signed)
Patient ID: [name], female   DOB: 1965-05-17, 47 y.o.   MRN: [id number] E Cardio Verite 30 day monitor applied

## 2013-02-04 ENCOUNTER — Telehealth: Payer: Self-pay | Admitting: Cardiovascular Disease

## 2013-02-04 NOTE — Telephone Encounter (Signed)
New problem   Pt is wearing heart monitor and is having an allergic reaction to sticky pads. Please call pt.

## 2013-02-04 NOTE — Telephone Encounter (Signed)
LEFT VOICE MAIL FOR PT    TO CALL MONITOR COMPANY  TO  GET  ELECTRODES FOR  SENSITIVE SKIN  SENT  TO HOME .Zack Seal

## 2013-02-08 ENCOUNTER — Ambulatory Visit (HOSPITAL_COMMUNITY): Payer: BC Managed Care – PPO | Attending: Internal Medicine

## 2013-02-08 ENCOUNTER — Other Ambulatory Visit (HOSPITAL_COMMUNITY): Payer: Self-pay | Admitting: Cardiovascular Disease

## 2013-02-08 DIAGNOSIS — I498 Other specified cardiac arrhythmias: Secondary | ICD-10-CM | POA: Insufficient documentation

## 2013-02-08 DIAGNOSIS — R0602 Shortness of breath: Secondary | ICD-10-CM

## 2013-02-08 DIAGNOSIS — R0609 Other forms of dyspnea: Secondary | ICD-10-CM | POA: Insufficient documentation

## 2013-02-08 DIAGNOSIS — R0989 Other specified symptoms and signs involving the circulatory and respiratory systems: Secondary | ICD-10-CM | POA: Insufficient documentation

## 2013-02-08 DIAGNOSIS — R0600 Dyspnea, unspecified: Secondary | ICD-10-CM

## 2013-02-08 DIAGNOSIS — R002 Palpitations: Secondary | ICD-10-CM | POA: Insufficient documentation

## 2013-02-08 DIAGNOSIS — R001 Bradycardia, unspecified: Secondary | ICD-10-CM

## 2013-02-08 NOTE — Progress Notes (Signed)
Echocardiogram performed.  

## 2013-02-25 ENCOUNTER — Ambulatory Visit (INDEPENDENT_AMBULATORY_CARE_PROVIDER_SITE_OTHER): Payer: BC Managed Care – PPO | Admitting: Family Medicine

## 2013-02-25 ENCOUNTER — Encounter: Payer: Self-pay | Admitting: Family Medicine

## 2013-02-25 VITALS — BP 153/90 | HR 76 | Wt 215.0 lb

## 2013-02-25 DIAGNOSIS — R0609 Other forms of dyspnea: Secondary | ICD-10-CM

## 2013-02-25 DIAGNOSIS — Z23 Encounter for immunization: Secondary | ICD-10-CM

## 2013-02-25 DIAGNOSIS — R0989 Other specified symptoms and signs involving the circulatory and respiratory systems: Secondary | ICD-10-CM

## 2013-02-25 DIAGNOSIS — N6089 Other benign mammary dysplasias of unspecified breast: Secondary | ICD-10-CM

## 2013-02-25 DIAGNOSIS — R0600 Dyspnea, unspecified: Secondary | ICD-10-CM

## 2013-02-25 DIAGNOSIS — I10 Essential (primary) hypertension: Secondary | ICD-10-CM

## 2013-02-25 DIAGNOSIS — N6099 Unspecified benign mammary dysplasia of unspecified breast: Secondary | ICD-10-CM

## 2013-02-25 NOTE — Assessment & Plan Note (Signed)
Unchanged.  Normal cardiac evaluation October 2014. No signs of pulmonary disease. Sounds consistent with sleep apnea.  Will do SS

## 2013-02-25 NOTE — Progress Notes (Signed)
  Subjective:    Patient ID: [name], female    DOB: May 11, 1965, 47 y.o.   MRN: [id number]  HPI  CPE  Feels well except shortness of breath at night and sleepiness during day. Recent cardiac work up was normal.   Does sometimes awake with start during night and has been told she snores  Patient reports no  vision/ hearing changes,anorexia, weight change, fever ,adenopathy, persistant / recurrent hoarseness, swallowing issues, chest pain, edema,persistant / recurrent cough, hemoptysis, dyspnea(rest, exertional, paroxysmal nocturnal), gastrointestinal  bleeding (melena, rectal bleeding), abdominal pain, excessive heart burn, GU symptoms(dysuria, hematuria, pyuria, voiding/incontinence  Issues) syncope, focal weakness, severe memory loss, concerning skin lesions, depression, anxiety, abnormal bruising/bleeding, major joint swelling, abnormal vaginal bleeding.     Review of Systems     Objective:   Physical Exam Alert no acute distress Neck:  No deformities, thyromegaly, masses, or tenderness noted.   Supple with full range of motion without pain.  Moderate fullness Heart - Regular rate and rhythm.  No murmurs, gallops or rubs.    Lungs:  Normal respiratory effort, chest expands symmetrically. Lungs are clear to auscultation, no crackles or wheezes. Abdomen: soft and non-tender without masses, organomegaly or hernias noted.  No guarding or rebound Extremities:  No cyanosis, edema, or deformity noted with good range of motion of all major joints.           Assessment & Plan:

## 2013-02-25 NOTE — Assessment & Plan Note (Signed)
Not at goal today but celebrated at homecoming over the weekend.  She will monitor

## 2013-02-25 NOTE — Patient Instructions (Signed)
Good to see you today!  Thanks for coming in.  Everything looks healthy except your weight - that is your major homework.  Aim to lose 1-2 lbs a week.  The most important thing to work on is diet  Your blood pressure is a little high today.  Try to check it every now and then it should be < 140/90.  If not come back to see me  We will schedule you for a sleep study.     Come back in 6 months

## 2013-02-28 ENCOUNTER — Telehealth: Payer: Self-pay | Admitting: *Deleted

## 2013-03-01 NOTE — Telephone Encounter (Signed)
New message ° ° ° ° °Returning Christine's call °

## 2013-03-01 NOTE — Telephone Encounter (Signed)
PT  NOTIFIED ./CY 

## 2013-04-03 ENCOUNTER — Other Ambulatory Visit: Payer: Self-pay | Admitting: Family Medicine

## 2013-06-24 ENCOUNTER — Other Ambulatory Visit: Payer: Self-pay | Admitting: Family Medicine

## 2013-06-24 DIAGNOSIS — N6310 Unspecified lump in the right breast, unspecified quadrant: Secondary | ICD-10-CM

## 2013-07-08 ENCOUNTER — Emergency Department (HOSPITAL_COMMUNITY)
Admission: EM | Admit: 2013-07-08 | Discharge: 2013-07-08 | Disposition: A | Discharge status: Home / Self Care | Payer: BC Managed Care – PPO | Attending: Emergency Medicine | Admitting: Emergency Medicine

## 2013-07-08 ENCOUNTER — Encounter (HOSPITAL_COMMUNITY): Payer: Self-pay | Admitting: Emergency Medicine

## 2013-07-08 ENCOUNTER — Emergency Department (HOSPITAL_COMMUNITY): Payer: BC Managed Care – PPO

## 2013-07-08 DIAGNOSIS — IMO0002 Reserved for concepts with insufficient information to code with codable children: Secondary | ICD-10-CM

## 2013-07-08 DIAGNOSIS — M25572 Pain in left ankle and joints of left foot: Secondary | ICD-10-CM

## 2013-07-08 DIAGNOSIS — Z8709 Personal history of other diseases of the respiratory system: Secondary | ICD-10-CM | POA: Insufficient documentation

## 2013-07-08 DIAGNOSIS — Z96669 Presence of unspecified artificial ankle joint: Secondary | ICD-10-CM | POA: Insufficient documentation

## 2013-07-08 DIAGNOSIS — Z79899 Other long term (current) drug therapy: Secondary | ICD-10-CM | POA: Insufficient documentation

## 2013-07-08 DIAGNOSIS — N189 Chronic kidney disease, unspecified: Secondary | ICD-10-CM | POA: Insufficient documentation

## 2013-07-08 DIAGNOSIS — M25579 Pain in unspecified ankle and joints of unspecified foot: Secondary | ICD-10-CM | POA: Insufficient documentation

## 2013-07-08 DIAGNOSIS — Z8719 Personal history of other diseases of the digestive system: Secondary | ICD-10-CM | POA: Insufficient documentation

## 2013-07-08 DIAGNOSIS — I129 Hypertensive chronic kidney disease with stage 1 through stage 4 chronic kidney disease, or unspecified chronic kidney disease: Secondary | ICD-10-CM | POA: Insufficient documentation

## 2013-07-08 DIAGNOSIS — Z7982 Long term (current) use of aspirin: Secondary | ICD-10-CM | POA: Insufficient documentation

## 2013-07-08 DIAGNOSIS — M1710 Unilateral primary osteoarthritis, unspecified knee: Secondary | ICD-10-CM | POA: Insufficient documentation

## 2013-07-08 MED ORDER — TRAMADOL HCL 50 MG PO TABS: 50.0000 mg | ORAL_TABLET | Freq: Four times a day (QID) | ORAL | Status: DC | PRN

## 2013-07-08 NOTE — ED Provider Notes (Signed)
CSN: [id number]     Arrival date & time 07/08/13  1525 History   First MD Initiated Contact with Patient 07/08/13 1601     This chart was scribed for non-physician practitioner, Antonietta Breach, PA-C working with Saddie Benders. Dorna Mai, MD by Forrestine Him, ED Scribe. This patient was seen in room TR08C/TR08C and the patient's care was started at 4:02 PM.   Chief Complaint  Patient presents with  . Ankle Problem   The history is provided by the patient. No language interpreter was used.    HPI Comments: [name] is a 48 y.o. female with chronic left ankle swelling who presents to the Emergency Department complaining of a gradual onset, gradually worsening sore to the left lateral ankle with associated pain x 1 week. Symptoms worsening x 2 days. She describes associated pain as burning and itching; pain is nonradiating. She has also noted a serous weeping from the sore sporadically. She has tried elevating her foot, along with OTC ibuprofen with no noticeable improvement. Pt admits to an infection to the left ankle 1 year ago resulting in surgery and IV abx. Pt then states she had joint replacement to the left ankle performed 3 years ago by Dr. Doran Durand.  At this time she denies any fever, numbness, paresthesia, or weakness to her ankle. She denies any recent trauma or injury to her ankle.   Past Medical History  Diagnosis Date  . Allergy   . Hypertension   . PONV (postoperative nausea and vomiting)     N/V and trouble urinating after surgery   . Chronic kidney disease     right renal cyst   . GERD (gastroesophageal reflux disease)     hx of no problems at present   . Headache(784.0)     occasional headache   . Arthritis     Lt ankle  . Influenza 04/20/2012   Past Surgical History  Procedure Laterality Date  . Breast surgery  2010    lumpectomy Lt breast  . Tubal ligation  1988  . Fracture surgery  2011    Lt ankle surgeries x 3   . Joint replacement      left ankle 8/12   . Other  surgical history      2 hand surgeries on left and shoulder surgery on right shoulder   . Endometrial ablation  October 18 2010    at [facility]  . Laparoscopic nephrectomy  03/09/2011    Procedure: LAPAROSCOPIC NEPHRECTOMY;  Surgeon: Franchot Gallo;  Location: [facility];  Service: Urology;  Laterality: Right;  Laparoscopic Assisted Removal Of Renal Cyst   . Total ankle arthroplasty Left 07/05/2012    Procedure: Removal of total ankle implants,I & D with insertion of  new poly spacer;  Surgeon: Wylene Simmer, MD;  Location: [place];  Service: Orthopedics;  Laterality: Left;   Family History  Problem Relation Age of Onset  . Diabetes Mother   . Heart disease Mother   . Hypertension Father   . Diabetes Sister   . Kidney disease Sister   . Hypertension Brother   . Kidney disease Brother   . Arthritis Brother   . Cancer Maternal Grandmother   . Diabetes Sister   . Hypertension Sister   . Hypertension Sister   . Hypertension Sister   . Hypertension Brother   . Mental illness Brother    History  Substance Use Topics  . Smoking status: Never Smoker   . Smokeless tobacco: Not on file  .  Alcohol Use: No   OB History   Grav Para Term Preterm Abortions TAB SAB Ect Mult Living                 Review of Systems  Constitutional: Negative for fever and chills.  Musculoskeletal: Positive for arthralgias.  Skin: Negative for rash.  Neurological: Negative for dizziness and weakness.  All other systems reviewed and are negative.    Allergies  Tetracycline hcl; Septra; and Sulfamethoxazole-trimethoprim  Home Medications   Current Outpatient Rx  Name  Route  Sig  Dispense  Refill  . aspirin EC 81 MG tablet   Oral   Take 81 mg by mouth daily.         Marland Kitchen ibuprofen (ADVIL,MOTRIN) 800 MG tablet   Oral   Take 800 mg by mouth daily as needed for moderate pain.         Marland Kitchen losartan (COZAAR) 50 MG tablet   Oral   Take 50 mg by mouth daily.         . Naproxen Sodium (ALEVE PO)   Oral    Take 1-2 tablets by mouth at bedtime as needed (sleep and pain). Aleve PM         . spironolactone (ALDACTONE) 25 MG tablet   Oral   Take 25 mg by mouth daily.         . vitamin E 400 UNIT capsule   Oral   Take 400 Units by mouth daily.          . traMADol (ULTRAM) 50 MG tablet   Oral   Take 1 tablet (50 mg total) by mouth every 6 (six) hours as needed.   15 tablet   0    Triage Vitals: BP 161/82  Pulse 61  Temp(Src) 98.9 F (37.2 C) (Oral)  Resp 18  SpO2 100%  Physical Exam  Nursing note and vitals reviewed. Constitutional: She is oriented to person, place, and time. She appears well-developed and well-nourished. No distress.  HENT:  Head: Normocephalic and atraumatic.  Eyes: Conjunctivae and EOM are normal. No scleral icterus.  Neck: Normal range of motion.  Cardiovascular: Normal rate, regular rhythm and intact distal pulses.   DP and PT pulses 2+ b/l. Capillary refill normal in all digits of the left foot.  Pulmonary/Chest: Effort normal. No respiratory distress.  Musculoskeletal:       Left ankle: She exhibits swelling (Chronic). She exhibits normal range of motion. Tenderness. Lateral malleolus tenderness found. Achilles tendon normal.       Left lower leg: Normal.       Left foot: Normal.  Well-healed surgical scars to anterior, medial, and lateral left ankle. No erythema or heat to touch appreciated to left ankle joint. No red linear streaking. Scab of 1.5cm in diameter just above L lateral malleolus; scab does not appear acutely infected.  Neurological: She is alert and oriented to person, place, and time. She has normal reflexes.  No gross sensory deficits appreciated. Patellar and Achilles reflexes 2+ bilaterally.  Skin: Skin is warm and dry. No rash noted. She is not diaphoretic. No erythema. No pallor.  Psychiatric: She has a normal mood and affect. Her behavior is normal.    ED Course  Procedures (including critical care time)  DIAGNOSTIC  STUDIES: Oxygen Saturation is 100% on RA, Normal by my interpretation.    COORDINATION OF CARE: 4:01 PM- Will order X-Ray. Discussed treatment plan with pt at bedside and pt agreed to plan.  Labs Review Labs Reviewed - No data to display Imaging Review Dg Ankle Complete Left  07/08/2013   CLINICAL DATA:  Left ankle pain with skin wound  EXAM: LEFT ANKLE COMPLETE - 3+ VIEW  COMPARISON:  07/04/2012  FINDINGS: There remains some lucency surrounding both the talar component of the ankle arthroplasty. Diffuse soft tissue swelling is again identified. No new fracture is seen. No other focal abnormality is noted.  IMPRESSION: Stable appearance of the left ankle when compared with the prior exam.   Electronically Signed   By: Inez Catalina M.D.   On: 07/08/2013 17:55     EKG Interpretation None      MDM   Final diagnoses:  Ankle pain, left   Uncomplicated left ankle pain. Patient well and nontoxic appearing, hemodynamically stable, and afebrile. She is neurovascularly intact in physical exam. No evidence of sepsis/infection to L ankle joint. X-ray imaging is stable compared to prior without evidence of joint effusion. Do not believe further emergent imaging is indicated at this time. Patient stable and appropriate for discharge with instruction to followup with her orthopedist. Ibuprofen, rest, ice and elevation recommended. Return precautions discussed and patient agreeable to plan with no unaddressed concerns.  I personally performed the services described in this documentation, which was scribed in my presence. The recorded information has been reviewed and is accurate.   Filed Vitals:   07/08/13 1533  BP: 161/82  Pulse: 61  Temp: 98.9 F (37.2 C)  TempSrc: Oral  Resp: 18  SpO2: 100%     Antonietta Breach, PA-C 07/08/13 1818

## 2013-07-08 NOTE — ED Notes (Signed)
Pt. Reports ankle replacement x2 years ago and 1 year ago became infected and had another surgery. Pt. Reports x1-2 weeks has had sore on lateral side of left ankle that is draining. States "I can't get it to heal. I don't want it to be infected again". Ankle swollen but states that is normal.

## 2013-07-08 NOTE — ED Notes (Signed)
Pt presents for pain and swelling to left ankle and sore to lateral side of it since a week ago. Has chronic swelling d/t replacement.

## 2013-07-08 NOTE — Discharge Instructions (Signed)
Your ankle does not appear to be infected today and your x-ray today is stable compared to past imaging. I do not believe he need to be started on antibiotics at this time. Recommend you followup with your orthopedist regarding your symptoms. Recommend ibuprofen for pain control and tramadol as needed for breakthrough pain. Keep your scabbed area covered to prevent further trauma or injury/aggravation to the area. Return if symptoms worsen.  Arthralgia Your caregiver has diagnosed you as suffering from an arthralgia. Arthralgia means there is pain in a joint. This can come from many reasons including:  Bruising the joint which causes soreness (inflammation) in the joint.  Wear and tear on the joints which occur as we grow older (osteoarthritis).  Overusing the joint.  Various forms of arthritis.  Infections of the joint. Regardless of the cause of pain in your joint, most of these different pains respond to anti-inflammatory drugs and rest. The exception to this is when a joint is infected, and these cases are treated with antibiotics, if it is a bacterial infection. HOME CARE INSTRUCTIONS   Rest the injured area for as long as directed by your caregiver. Then slowly start using the joint as directed by your caregiver and as the pain allows. Crutches as directed may be useful if the ankles, knees or hips are involved. If the knee was splinted or casted, continue use and care as directed. If an stretchy or elastic wrapping bandage has been applied today, it should be removed and re-applied every 3 to 4 hours. It should not be applied tightly, but firmly enough to keep swelling down. Watch toes and feet for swelling, bluish discoloration, coldness, numbness or excessive pain. If any of these problems (symptoms) occur, remove the ace bandage and re-apply more loosely. If these symptoms persist, contact your caregiver or return to this location.  For the first 24 hours, keep the injured extremity  elevated on pillows while lying down.  Apply ice for 15-20 minutes to the sore joint every couple hours while awake for the first half day. Then 03-04 times per day for the first 48 hours. Put the ice in a plastic bag and place a towel between the bag of ice and your skin.  Wear any splinting, casting, elastic bandage applications, or slings as instructed.  Only take over-the-counter or prescription medicines for pain, discomfort, or fever as directed by your caregiver. Do not use aspirin immediately after the injury unless instructed by your physician. Aspirin can cause increased bleeding and bruising of the tissues.  If you were given crutches, continue to use them as instructed and do not resume weight bearing on the sore joint until instructed. Persistent pain and inability to use the sore joint as directed for more than 2 to 3 days are warning signs indicating that you should see a caregiver for a follow-up visit as soon as possible. Initially, a hairline fracture (break in bone) may not be evident on X-rays. Persistent pain and swelling indicate that further evaluation, non-weight bearing or use of the joint (use of crutches or slings as instructed), or further X-rays are indicated. X-rays may sometimes not show a small fracture until a week or 10 days later. Make a follow-up appointment with your own caregiver or one to whom we have referred you. A radiologist (specialist in reading X-rays) may read your X-rays. Make sure you know how you are to obtain your X-ray results. Do not assume everything is normal if you do not hear from [place].  SEEK MEDICAL CARE IF: Bruising, swelling, or pain increases. SEEK IMMEDIATE MEDICAL CARE IF:   Your fingers or toes are numb or blue.  The pain is not responding to medications and continues to stay the same or get worse.  The pain in your joint becomes severe.  You develop a fever over 102 F (38.9 C).  It becomes impossible to move or use the joint. MAKE  SURE YOU:   Understand these instructions.  Will watch your condition.  Will get help right away if you are not doing well or get worse. Document Released: 04/18/2005 Document Revised: 07/11/2011 Document Reviewed: 12/05/2007 [facility] Patient Information 2014 [place].

## 2013-07-08 NOTE — ED Notes (Signed)
Pt returned from xray. Pt ambulated to restroom independently; NAD.

## 2013-07-09 ENCOUNTER — Other Ambulatory Visit: Payer: Self-pay | Admitting: Family Medicine

## 2013-07-09 ENCOUNTER — Ambulatory Visit
Admission: RE | Admit: 2013-07-09 | Discharge: 2013-07-09 | Disposition: A | Discharge status: Home / Self Care | Payer: BC Managed Care – PPO | Source: Ambulatory Visit | Attending: Family Medicine | Admitting: Family Medicine

## 2013-07-09 ENCOUNTER — Ambulatory Visit
Admission: RE | Admit: 2013-07-09 | Discharge: 2013-07-09 | Disposition: A | Discharge status: Home / Self Care | Payer: Self-pay | Source: Ambulatory Visit | Attending: Family Medicine | Admitting: Family Medicine

## 2013-07-09 DIAGNOSIS — N6310 Unspecified lump in the right breast, unspecified quadrant: Secondary | ICD-10-CM

## 2013-07-09 DIAGNOSIS — R921 Mammographic calcification found on diagnostic imaging of breast: Secondary | ICD-10-CM

## 2013-07-10 NOTE — ED Provider Notes (Signed)
Medical screening examination/treatment/procedure(s) were performed by non-physician practitioner and as supervising physician I was immediately available for consultation/collaboration.   EKG Interpretation None        Saddie Benders. Dorna Mai, MD 07/10/13 2035

## 2013-07-16 ENCOUNTER — Ambulatory Visit
Admission: RE | Admit: 2013-07-16 | Discharge: 2013-07-16 | Disposition: A | Discharge status: Home / Self Care | Payer: BC Managed Care – PPO | Source: Ambulatory Visit | Attending: Family Medicine | Admitting: Family Medicine

## 2013-07-16 DIAGNOSIS — R921 Mammographic calcification found on diagnostic imaging of breast: Secondary | ICD-10-CM

## 2013-09-23 ENCOUNTER — Other Ambulatory Visit: Payer: Self-pay | Admitting: Family Medicine

## 2013-10-02 ENCOUNTER — Ambulatory Visit (INDEPENDENT_AMBULATORY_CARE_PROVIDER_SITE_OTHER): Payer: BC Managed Care – PPO | Admitting: Family Medicine

## 2013-10-02 ENCOUNTER — Encounter: Payer: Self-pay | Admitting: Family Medicine

## 2013-10-02 VITALS — BP 155/95 | HR 59 | Temp 99.0°F | Wt 219.0 lb

## 2013-10-02 DIAGNOSIS — M7989 Other specified soft tissue disorders: Secondary | ICD-10-CM

## 2013-10-02 NOTE — Patient Instructions (Signed)
Thank you for coming in,   Compression stockings are the best things for your leg swelling.     Please feel free to call with any questions or concerns at any time, at [phone]. --Dr. Raeford Razor  Venous Stasis or Chronic Venous Insufficiency Chronic venous insufficiency, also called venous stasis, is a condition that affects the veins in the legs. The condition prevents blood from being pumped through these veins effectively. Blood may no longer be pumped effectively from the legs back to the heart. This condition can range from mild to severe. With proper treatment, you should be able to continue with an active life. CAUSES  Chronic venous insufficiency occurs when the vein walls become stretched, weakened, or damaged or when valves within the vein are damaged. Some common causes of this include:  High blood pressure inside the veins (venous hypertension).  Increased blood pressure in the leg veins from long periods of sitting or standing.  A blood clot that blocks blood flow in a vein (deep vein thrombosis).  Inflammation of a superficial vein (phlebitis) that causes a blood clot to form. RISK FACTORS Various things can make you more likely to develop chronic venous insufficiency, including:  Family history of this condition.  Obesity.  Pregnancy.  Sedentary lifestyle.  Smoking.  Jobs requiring long periods of standing or sitting in one place.  Being a certain age. Women in their 78s and 65s and men in their 67s are more likely to develop this condition. SIGNS AND SYMPTOMS  Symptoms may include:   Varicose veins.  Skin breakdown or ulcers.  Reddened or discolored skin on the leg.  Brown, smooth, tight, and painful skin just above the ankle, usually on the inside surface (lipodermatosclerosis).  Swelling. DIAGNOSIS  To diagnose this condition, your health care provider will take a medical history and do a physical exam. The following tests may be ordered to confirm the  diagnosis:  Duplex ultrasound A procedure that produces a picture of a blood vessel and nearby organs and also provides information on blood flow through the blood vessel.  Plethysmography A procedure that tests blood flow.  A venogram, or venography A procedure used to look at the veins using X-ray and dye. TREATMENT The goals of treatment are to help you return to an active life and to minimize pain or disability. Treatment will depend on the severity of the condition. Medical procedures may be needed for severe cases. Treatment options may include:   Use of compression stockings. These can help with symptoms and lower the chances of the problem getting worse, but they do not cure the problem.  Sclerotherapy A procedure involving an injection of a material that "dissolves" the damaged veins. Other veins in the network of blood vessels take over the function of the damaged veins.  Surgery to remove the vein or cut off blood flow through the vein (vein stripping or laser ablation surgery).  Surgery to repair a valve. HOME CARE INSTRUCTIONS   Wear compression stockings as directed by your health care provider.  Only take over-the-counter or prescription medicines for pain, discomfort, or fever as directed by your health care provider.  Follow up with your health care provider as directed. SEEK MEDICAL CARE IF:   You have redness, swelling, or increasing pain in the affected area.  You see a red streak or line that extends up or down from the affected area.  You have a breakdown or loss of skin in the affected area, even if the breakdown  is small.  You have an injury to the affected area. SEEK IMMEDIATE MEDICAL CARE IF:   You have an injury and open wound in the affected area.  Your pain is severe and does not improve with medicine.  You have sudden numbness or weakness in the foot or ankle below the affected area, or you have trouble moving your foot or ankle.  You have a  fever or persistent symptoms for more than 2 3 days.  You have a fever and your symptoms suddenly get worse. MAKE SURE YOU:   Understand these instructions.  Will watch your condition.  Will get help right away if you are not doing well or get worse. Document Released: 08/22/2006 Document Revised: 02/06/2013 Document Reviewed: 12/24/2012 [facility] Patient Information 2014 [place].

## 2013-10-02 NOTE — Progress Notes (Signed)
   Subjective:    Patient ID: [name], female    DOB: January 10, 1966, 48 y.o.   MRN: [id number]  HPI [name] is here for leg and arm swelling.   She reports to having leg swelling bilaterally and left arm swelling. She has had ankle replacement on her left ankle. She has carpal tunnel bilaterally and surgery on her left wrist for a cyst removal.  She has had a left breast biopsy.  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 She also does lifting and moving of different objects at work. She has had swelling in her legs previously but noticed swelling in her left arm yesterday.  She denies any history of having clots. She doesn't smoke and doesn't take birth control.  She denies any weakness in her arms or legs. She does have some decreased sensation in her hands but this is her baseline.    Current Outpatient Prescriptions on File Prior to Visit  Medication Sig Dispense Refill  . aspirin EC 81 MG tablet Take 81 mg by mouth daily.      Marland Kitchen losartan (COZAAR) 50 MG tablet Take 50 mg by mouth daily.      Marland Kitchen spironolactone (ALDACTONE) 25 MG tablet TAKE 1 TABLET BY MOUTH EVERY DAY  30 tablet  0  . vitamin E 400 UNIT capsule Take 400 Units by mouth daily.       Marland Kitchen ibuprofen (ADVIL,MOTRIN) 800 MG tablet Take 800 mg by mouth daily as needed for moderate pain.      . Naproxen Sodium (ALEVE PO) Take 1-2 tablets by mouth at bedtime as needed (sleep and pain). Aleve PM      . traMADol (ULTRAM) 50 MG tablet Take 1 tablet (50 mg total) by mouth every 6 (six) hours as needed.  15 tablet  0   No current facility-administered medications on file prior to visit.    Review of Systems See HPI     Objective:   Physical Exam BP 155/95  Pulse 59  Temp(Src) 99 F (37.2 C) (Oral)  Wt 219 lb (99.338 kg) Gen: NAD, alert, cooperative with exam, well-appearing Pulm: clear breath sounds b/l, no crackles, no extra effort of breathing.   Ext: no noticeable edema appreciated in UE, UE are symmetrical b/l,  scar on posterior aspect of left wrist, 5/5 grip strength, +2 pulses in UE/LE b/l, LE are symmetrical b/l, surgical scars noticed on left ankle, left ankle is enlarged compared to right but is at baseline.      Assessment & Plan:

## 2013-10-03 ENCOUNTER — Encounter: Payer: Self-pay | Admitting: Family Medicine

## 2013-10-03 DIAGNOSIS — M7989 Other specified soft tissue disorders: Secondary | ICD-10-CM | POA: Insufficient documentation

## 2013-10-03 NOTE — Assessment & Plan Note (Signed)
Reports swelling in her legs and left arm. No sign of clot, no shortness of breath or chest pain. Most likely associated to her previous surgeries and working as a Radiation protection practitioner doing manual labor.  - encourage to wear compression stocking  - elevation of extremities as needed  - f/u PRN

## 2013-10-09 ENCOUNTER — Ambulatory Visit: Payer: Self-pay | Admitting: Podiatrist

## 2013-10-21 ENCOUNTER — Other Ambulatory Visit: Payer: Self-pay | Admitting: Family Medicine

## 2013-10-29 ENCOUNTER — Telehealth: Payer: Self-pay | Admitting: Family Medicine

## 2013-10-29 NOTE — Telephone Encounter (Signed)
When she picked up her prescription for spironolactone, she only got 15 pills. She would like to know why

## 2013-10-30 ENCOUNTER — Ambulatory Visit (INDEPENDENT_AMBULATORY_CARE_PROVIDER_SITE_OTHER): Payer: BC Managed Care – PPO | Admitting: Podiatrist

## 2013-10-30 ENCOUNTER — Ambulatory Visit (INDEPENDENT_AMBULATORY_CARE_PROVIDER_SITE_OTHER): Payer: BC Managed Care – PPO

## 2013-10-30 ENCOUNTER — Encounter: Payer: Self-pay | Admitting: Podiatrist

## 2013-10-30 VITALS — BP 142/77 | HR 64 | Resp 12

## 2013-10-30 DIAGNOSIS — M779 Enthesopathy, unspecified: Secondary | ICD-10-CM

## 2013-10-30 MED ORDER — IBUPROFEN 800 MG PO TABS: 800.0000 mg | ORAL_TABLET | Freq: Four times a day (QID) | ORAL | Status: DC | PRN

## 2013-10-30 NOTE — Telephone Encounter (Signed)
LM for patient to call back.  Please give message from MD when she does and help schedule an OV for her.  Thanks [employer]

## 2013-10-30 NOTE — Telephone Encounter (Signed)
Pharmacy should have told her she needs an office visit.  Has been more than a year since had blood work to check her K.  Ask her to see me at an office visit Thanks [facility]

## 2013-10-30 NOTE — Progress Notes (Signed)
   Subjective:    Patient ID: [name], female    DOB: 12-12-1965, 48 y.o.   MRN: [id number]  HPI  PT STATED RT TOP OF THE FOOT IS HURTING AND HAVE BURNING SENSATION. THE FOOT IS GETTING WORSE. THE FOOT GET AGGRAVATED WALKING AND SITTING. TRIED IBUPROFEN 800MG  QID AND NO HELP.    Review of Systems  Cardiovascular: Positive for leg swelling.  Gastrointestinal: Positive for nausea and constipation.  All other systems reviewed and are negative.      Objective:   Physical Exam Patient is awake, alert, and oriented x 3.  In no acute distress.  Vascular status is intact with palpable pedal pulses at 2/4 DP and PT bilateral and capillary refill time within normal limits. Neurological sensation is also intact bilaterally via Semmes Weinstein monofilament at 5/5 sites. Light touch, vibratory sensation, Achilles tendon reflex is intact. Dermatological exam reveals skin color, turger and texture as normal. No open lesions present.  Musculature intact with dorsiflexion, plantarflexion, inversion, eversion. Right foot. Left foot and ankle is swollen. She previously has undergone 4 surgeries on this left ankle by Dr. Doran Durand. She finally ended up with a total ankle replacement and states that she's finally doing well with that. Her issue today is the right foot on the dorsal lateral aspect. She feels she has been compensating so much the left foot and was worried she may have broken it. She's had pain in this right foot for several weeks. X-rays are negative for fracture. Pain on palpation dorsal lateral aspect of the right foot at the third and fourth metatarsal base cuneiform articulation is noted.     Assessment & Plan:  Sprain right foot, bursitis, capsulitis  Plan: Under sterile technique I injected the area of maximal tenderness with Kenalog and Marcaine plain. Recommended her ankle brace and ibuprofen 800 mg to be continued. If this is not beneficial she will call and we will consider boot  therapy. A boot on this right foot be difficult however she is still favoring it due to the problems with the left.

## 2013-11-08 ENCOUNTER — Encounter: Payer: Self-pay | Admitting: Family Medicine

## 2013-11-08 ENCOUNTER — Ambulatory Visit (INDEPENDENT_AMBULATORY_CARE_PROVIDER_SITE_OTHER): Payer: BC Managed Care – PPO | Admitting: Family Medicine

## 2013-11-08 VITALS — BP 149/85 | HR 51 | Ht 68.0 in | Wt 216.0 lb

## 2013-11-08 DIAGNOSIS — I10 Essential (primary) hypertension: Secondary | ICD-10-CM

## 2013-11-08 LAB — BASIC METABOLIC PANEL
BUN: 12 mg/dL (ref 6–23)
CO2: 24 meq/L (ref 19–32)
CREATININE: 0.82 mg/dL (ref 0.50–1.10)
Calcium: 8.9 mg/dL (ref 8.4–10.5)
Chloride: 102 mEq/L (ref 96–112)
Glucose, Bld: 81 mg/dL (ref 70–99)
Potassium: 3.7 mEq/L (ref 3.5–5.3)
SODIUM: 135 meq/L (ref 135–145)

## 2013-11-08 MED ORDER — SPIRONOLACTONE 25 MG PO TABS: 25.0000 mg | ORAL_TABLET | Freq: Every day | ORAL | Status: DC

## 2013-11-08 MED ORDER — LOSARTAN POTASSIUM 50 MG PO TABS: 50.0000 mg | ORAL_TABLET | Freq: Every day | ORAL | Status: DC

## 2013-11-08 NOTE — Assessment & Plan Note (Signed)
Near goal. Likely elevated as she just got off work in [employer].  Monitor at home Check labs

## 2013-11-08 NOTE — Progress Notes (Signed)
   Subjective:    Patient ID: [name], female    DOB: 12-Apr-1966, 48 y.o.   MRN: [id number]  HPI HYPERTENSION Disease Monitoring Home BP Monitoring not checking but has meter Chest pain- no    Dyspnea- no Medications Compliance-  Regularly takes cozaar and sprironolactone.  Drinks large amts of water. Lightheadedness-  no  Edema- mild at end of day. ROS - See HPI  PMH Lab Review   Potassium  Date Value Ref Range Status  11/17/2012 3.6  3.5 - 5.1 mEq/L Final  03/18/2009 3.0* 3.3 - 4.7 mEq/L Final     Sodium  Date Value Ref Range Status  11/17/2012 137  135 - 145 mEq/L Final  03/18/2009 136  128 - 145 mEq/L Final     Creat  Date Value Ref Range Status  10/04/2012 0.66  0.50 - 1.10 mg/dL Final     Creatinine, Ser  Date Value Ref Range Status  11/17/2012 0.70  0.50 - 1.10 mg/dL Final     Headache Mild temporal has for days better with food or rest worse with work and heat.  No photophobia or nausea or vomiting.  Ibuprofen helps.  No visual changes or weakness or loss of consciousness or confusion or fever    Chief Complaint noted Review of Symptoms - see HPI PMH - Smoking status noted.   Vital Signs reviewed     Review of Systems     Objective:   Physical Exam  Alert no acute distress Heart - Regular rate and rhythm.  No murmurs, gallops or rubs.    Lungs:  Normal respiratory effort, chest expands symmetrically. Lungs are clear to auscultation, no crackles or wheezes.  Extremities - mild 1+ edema at ankle       Assessment & Plan:

## 2013-11-08 NOTE — Patient Instructions (Signed)
Good to see you today!  Thanks for coming in.  I will call you if your tests are not good.  Otherwise I will send you a letter.  If you do not hear from me with in 2 weeks please call our office.     Check your blood pressure 2-3 times a week it should be < 140/90 if regularly above that the call me

## 2013-11-11 ENCOUNTER — Encounter: Payer: Self-pay | Admitting: Family Medicine

## 2013-12-06 ENCOUNTER — Emergency Department (HOSPITAL_COMMUNITY)
Admission: EM | Admit: 2013-12-06 | Discharge: 2013-12-07 | Disposition: A | Discharge status: Home / Self Care | Payer: BC Managed Care – PPO | Attending: Emergency Medicine | Admitting: Emergency Medicine

## 2013-12-06 ENCOUNTER — Encounter (HOSPITAL_COMMUNITY): Payer: Self-pay | Admitting: Emergency Medicine

## 2013-12-06 DIAGNOSIS — I129 Hypertensive chronic kidney disease with stage 1 through stage 4 chronic kidney disease, or unspecified chronic kidney disease: Secondary | ICD-10-CM | POA: Insufficient documentation

## 2013-12-06 DIAGNOSIS — N189 Chronic kidney disease, unspecified: Secondary | ICD-10-CM | POA: Insufficient documentation

## 2013-12-06 DIAGNOSIS — Z79899 Other long term (current) drug therapy: Secondary | ICD-10-CM | POA: Insufficient documentation

## 2013-12-06 DIAGNOSIS — Z8719 Personal history of other diseases of the digestive system: Secondary | ICD-10-CM | POA: Insufficient documentation

## 2013-12-06 DIAGNOSIS — Z8709 Personal history of other diseases of the respiratory system: Secondary | ICD-10-CM | POA: Insufficient documentation

## 2013-12-06 DIAGNOSIS — R350 Frequency of micturition: Secondary | ICD-10-CM | POA: Insufficient documentation

## 2013-12-06 DIAGNOSIS — R110 Nausea: Secondary | ICD-10-CM | POA: Insufficient documentation

## 2013-12-06 DIAGNOSIS — N390 Urinary tract infection, site not specified: Secondary | ICD-10-CM | POA: Insufficient documentation

## 2013-12-06 DIAGNOSIS — M129 Arthropathy, unspecified: Secondary | ICD-10-CM | POA: Insufficient documentation

## 2013-12-06 DIAGNOSIS — Z7982 Long term (current) use of aspirin: Secondary | ICD-10-CM | POA: Insufficient documentation

## 2013-12-06 LAB — URINALYSIS, ROUTINE W REFLEX MICROSCOPIC
BILIRUBIN URINE: NEGATIVE
Glucose, UA: NEGATIVE mg/dL
Ketones, ur: NEGATIVE mg/dL
Nitrite: NEGATIVE
Protein, ur: NEGATIVE mg/dL
SPECIFIC GRAVITY, URINE: 1.005 (ref 1.005–1.030)
UROBILINOGEN UA: 0.2 mg/dL (ref 0.0–1.0)
pH: 6 (ref 5.0–8.0)

## 2013-12-06 LAB — URINE MICROSCOPIC-ADD ON

## 2013-12-06 NOTE — ED Notes (Signed)
Pt ambulatory to room.

## 2013-12-06 NOTE — ED Notes (Signed)
Pt reports low abd pain with urinary frequency and dysuria.

## 2013-12-07 MED ORDER — KETOROLAC TROMETHAMINE 30 MG/ML IJ SOLN
30.0000 mg | Freq: Once | INTRAMUSCULAR | Status: AC
  Administered 2013-12-07: 30 mg via INTRAMUSCULAR
  Filled 2013-12-07: qty 1

## 2013-12-07 MED ORDER — HYDROCODONE-ACETAMINOPHEN 5-325 MG PO TABS: 1.0000 | ORAL_TABLET | Freq: Four times a day (QID) | ORAL | Status: DC | PRN

## 2013-12-07 MED ORDER — CEPHALEXIN 500 MG PO CAPS: ORAL_CAPSULE | ORAL | Status: DC

## 2013-12-07 NOTE — Discharge Instructions (Signed)
Stay very well hydrated with plenty of water throughout the day. Take antibiotic until completed. Follow up with your urologist or the urologist listed above for recheck of ongoing symptoms. Use ibuprofen for pain, or norco for unrelieved pain, but don't drive or operate machinery while taking this medication. Return to ER for emergent changing or worsening of symptoms. Please seek immediate care if you develop the following: You develop back pain.  Your symptoms are no better, or worse in 3 days. There is severe back pain or lower abdominal pain.  You develop chills.  You have a fever.  There is nausea or vomiting.  There is continued burning or discomfort with urination.    Urinary Tract Infection A urinary tract infection (UTI) can occur any place along the urinary tract. The tract includes the kidneys, ureters, bladder, and urethra. A type of germ called bacteria often causes a UTI. UTIs are often helped with antibiotic medicine.  HOME CARE   If given, take antibiotics as told by your doctor. Finish them even if you start to feel better.  Drink enough fluids to keep your pee (urine) clear or pale yellow.  Avoid tea, drinks with caffeine, and bubbly (carbonated) drinks.  Pee often. Avoid holding your pee in for a long time.  Pee before and after having sex (intercourse).  Wipe from front to back after you poop (bowel movement) if you are a woman. Use each tissue only once. GET HELP RIGHT AWAY IF:   You have back pain.  You have lower belly (abdominal) pain.  You have chills.  You feel sick to your stomach (nauseous).  You throw up (vomit).  Your burning or discomfort with peeing does not go away.  You have a fever.  Your symptoms are not better in 3 days. MAKE SURE YOU:   Understand these instructions.  Will watch your condition.  Will get help right away if you are not doing well or get worse. Document Released: 10/05/2007 Document Revised: 01/11/2012 Document  Reviewed: 11/17/2011 [facility] Patient Information 2015 [place], [place]. This information is not intended to replace advice given to you by your health care provider. Make sure you discuss any questions you have with your health care provider.

## 2013-12-07 NOTE — ED Provider Notes (Signed)
CSN: [id number]     Arrival date & time 12/06/13  2315 History   First MD Initiated Contact with Patient 12/06/13 2332     Chief Complaint  Patient presents with  . Urinary Frequency  . Abdominal Pain     (Consider location/radiation/quality/duration/timing/severity/associated sxs/prior Treatment) HPI Comments: [name] is a 48 y.o. Female with a PMHx of b/l renal cysts, GERD, and HTN who presents today with complaints of increased urinary frequency and urgency and suprapubic abdominal pressure-like pain which initially radiated a dull pain into her L flank and now radiates into her R flank intermittently. Endorses that the pain is moderate, constant, worse with urination and with no known alleviating factors, and she endorses that she hasn't tried anything for pain today. States her urine has been stronger smelling and darker than normal. She tried increasing her fluids which did not help with the pain but has made her urinate more frequently and has made her urine slightly clearer. States she had an appt with urology this week but it was cancelled and rescheduled for next week, and she doesn't feel she can wait that long. Associated symptoms include mild nausea. Denies fevers, chills, CP, SOB, vomiting, diarrhea, constipation, vaginal discharge or itching, dyspareunia, myalgias, arthralgias, or rashes. No longer has menses after an endometrial ablation. States she had a BTL in 1988. Endorses that she was last sexually active 2 weeks ago.  Patient is a 48 y.o. female presenting with frequency and abdominal pain. The history is provided by the patient. No language interpreter was used.  Urinary Frequency This is a new problem. The current episode started yesterday. The problem occurs constantly. The problem has been gradually worsening. Associated symptoms include abdominal pain, nausea (mild) and urinary symptoms. Pertinent negatives include no anorexia, arthralgias, change in bowel habit,  chest pain, chills, diaphoresis, fever, myalgias, numbness, rash, vomiting or weakness. Nothing aggravates the symptoms. She has tried drinking for the symptoms. The treatment provided no relief.  Abdominal Pain Associated symptoms: dysuria, hematuria and nausea (mild)   Associated symptoms: no anorexia, no chest pain, no chills, no constipation, no diarrhea, no fever, no shortness of breath, no vaginal bleeding, no vaginal discharge and no vomiting     Past Medical History  Diagnosis Date  . Allergy   . Hypertension   . PONV (postoperative nausea and vomiting)     N/V and trouble urinating after surgery   . Chronic kidney disease     right renal cyst   . GERD (gastroesophageal reflux disease)     hx of no problems at present   . Headache(784.0)     occasional headache   . Arthritis     Lt ankle  . Influenza 04/20/2012   Past Surgical History  Procedure Laterality Date  . Breast surgery  2010    lumpectomy Lt breast  . Tubal ligation  1988  . Fracture surgery  2011    Lt ankle surgeries x 3   . Joint replacement      left ankle 8/12   . Other surgical history      2 hand surgeries on left and shoulder surgery on right shoulder   . Endometrial ablation  October 18 2010    at [facility]  . Laparoscopic nephrectomy  03/09/2011    Procedure: LAPAROSCOPIC NEPHRECTOMY;  Surgeon: Franchot Gallo;  Location: [facility];  Service: Urology;  Laterality: Right;  Laparoscopic Assisted Removal Of Renal Cyst   . Total ankle arthroplasty Left 07/05/2012  Procedure: Removal of total ankle implants,I & D with insertion of  new poly spacer;  Surgeon: Wylene Simmer, MD;  Location: [place];  Service: Orthopedics;  Laterality: Left;   Family History  Problem Relation Age of Onset  . Diabetes Mother   . Heart disease Mother   . Hypertension Father   . Diabetes Sister   . Kidney disease Sister   . Hypertension Brother   . Kidney disease Brother   . Arthritis Brother   . Cancer Maternal Grandmother   .  Diabetes Sister   . Hypertension Sister   . Hypertension Sister   . Hypertension Sister   . Hypertension Brother   . Mental illness Brother    History  Substance Use Topics  . Smoking status: Never Smoker   . Smokeless tobacco: Not on file  . Alcohol Use: No   OB History   Grav Para Term Preterm Abortions TAB SAB Ect Mult Living                 Review of Systems  Constitutional: Negative for fever, chills and diaphoresis.  Respiratory: Negative for shortness of breath.   Cardiovascular: Negative for chest pain.  Gastrointestinal: Positive for nausea (mild) and abdominal pain. Negative for vomiting, diarrhea, constipation, blood in stool, abdominal distention, anorexia and change in bowel habit.  Genitourinary: Positive for dysuria, urgency, frequency, hematuria and flank pain. Negative for vaginal bleeding, vaginal discharge, vaginal pain, pelvic pain and dyspareunia.  Musculoskeletal: Negative for arthralgias and myalgias.  Skin: Negative for rash.  Neurological: Negative for dizziness, weakness and numbness.  10 Systems reviewed and are negative for acute change except as noted in the HPI.     Allergies  Tetracycline hcl; Septra; and Sulfamethoxazole-trimethoprim  Home Medications   Prior to Admission medications   Medication Sig Start Date End Date Taking? Authorizing Provider  aspirin EC 81 MG tablet Take 81 mg by mouth daily.   Yes Historical Provider, MD  Biotin 5000 MCG TABS Take by mouth.   Yes Historical Provider, MD  ibuprofen (ADVIL,MOTRIN) 800 MG tablet Take 1 tablet (800 mg total) by mouth every 6 (six) hours as needed for moderate pain. 10/30/13  Yes Viona Gilmore Egerton, DPM  losartan (COZAAR) 50 MG tablet Take 1 tablet (50 mg total) by mouth daily. 11/08/13  Yes Lind Covert, MD  spironolactone (ALDACTONE) 25 MG tablet Take 1 tablet (25 mg total) by mouth daily. 11/08/13  Yes Lind Covert, MD  traMADol (ULTRAM) 50 MG tablet Take 50 mg by mouth  every 6 (six) hours as needed for moderate pain.   Yes Historical Provider, MD  vitamin E 400 UNIT capsule Take 400 Units by mouth daily.    Yes Historical Provider, MD  cephALEXin (KEFLEX) 500 MG capsule 2 caps po bid x 7 days 12/07/13   Patty Sermons Camprubi-Soms, PA-C  HYDROcodone-acetaminophen (NORCO) 5-325 MG per tablet Take 1-2 tablets by mouth every 6 (six) hours as needed for severe pain. 12/07/13   Jahmire Ruffins Strupp Camprubi-Soms, PA-C   BP 164/73  Pulse 64  Temp(Src) 98.9 F (37.2 C) (Oral)  Resp 16  SpO2 100% Physical Exam  Nursing note and vitals reviewed. Constitutional: She is oriented to person, place, and time. Vital signs are normal. She appears well-developed and well-nourished. No distress.  Afebrile, nontoxic, NAD  HENT:  Head: Normocephalic and atraumatic.  Mouth/Throat: Mucous membranes are normal.  Eyes: Conjunctivae and EOM are normal. Right eye exhibits no discharge. Left eye exhibits no  discharge.  Neck: Normal range of motion. Neck supple.  Cardiovascular: Normal rate, regular rhythm, normal heart sounds and intact distal pulses.  Exam reveals no gallop and no friction rub.   No murmur heard. Pulmonary/Chest: Effort normal and breath sounds normal. No respiratory distress. She has no decreased breath sounds. She has no wheezes. She has no rhonchi. She has no rales.  Abdominal: Soft. Normal appearance and bowel sounds are normal. She exhibits no distension. There is tenderness in the suprapubic area. There is no rigidity, no rebound, no guarding, no CVA tenderness and no tenderness at McBurney's point.    +BS throughout, soft, nondistended. TTP in suprapubic area and into lateral R abdomen without McBurney's point TTP, no r/g/r. No lower pelvic TTP. No CVA TTP.   Musculoskeletal: Normal range of motion.  Neurological: She is alert and oriented to person, place, and time.  Skin: Skin is warm, dry and intact. No rash noted.  Psychiatric: She has a normal mood and  affect.    ED Course  Procedures (including critical care time) Labs Review Labs Reviewed  URINALYSIS, ROUTINE W REFLEX MICROSCOPIC - Abnormal; Notable for the following:    Hgb urine dipstick LARGE (*)    Leukocytes, UA LARGE (*)    All other components within normal limits  URINE MICROSCOPIC-ADD ON    Imaging Review No results found.   EKG Interpretation None      MDM   Final diagnoses:  UTI (lower urinary tract infection)     48y/o female with UTI symptoms. Afebrile and does not seem to have renal colicky symptoms, doubt nephrolithiasis or pyelo, will not obtain further imaging. No vaginal complaints and pain is not in pelvic area, therefore will not obtain pelvic exam at this time. U/A showing large Hgb, Large leuks, 11-20 WBC and rare bacteria, therefore given this presentation I believe this represents UTI. Will give pt shot of toradol for pain now, since pt is driving and cannot give narcotics here, and d/c with rx for norco and keflex. Discussed f/up with her urologist at her regularly scheduled appt, but if symptoms did not subside or changed, to call urologist given to her on d/c papers to schedule a sooner appt. Discussed good oral hydration. Pt refusing anything for nausea, stating she's able to tolerate fluids well and just feels nauseated due to pain. I explained the diagnosis and have given explicit precautions to return to the ER including for any other new or worsening symptoms. The patient understands and accepts the medical plan as it's been dictated and I have answered their questions. Discharge instructions concerning home care and prescriptions have been given. The patient is STABLE and is discharged to home in good condition.  BP 164/73  Pulse 64  Temp(Src) 98.9 F (37.2 C) (Oral)  Resp 16  SpO2 100%  Meds ordered this encounter  Medications  . ketorolac (TORADOL) 30 MG/ML injection 30 mg    Sig:   . cephALEXin (KEFLEX) 500 MG capsule    Sig: 2 caps  po bid x 7 days    Dispense:  28 capsule    Refill:  0    Order Specific Question:  Supervising Provider    Answer:  Noemi Chapel D [[id number]]  . HYDROcodone-acetaminophen (NORCO) 5-325 MG per tablet    Sig: Take 1-2 tablets by mouth every 6 (six) hours as needed for severe pain.    Dispense:  6 tablet    Refill:  0    Order Specific Question:  Supervising Provider    Answer:  Noemi Chapel D [id number] [place], PA-C 12/07/13 [phone]

## 2013-12-07 NOTE — ED Provider Notes (Signed)
Medical screening examination/treatment/procedure(s) were performed by non-physician practitioner and as supervising physician I was immediately available for consultation/collaboration.    Johnna Acosta, MD 12/07/13 [phone]

## 2014-02-21 ENCOUNTER — Ambulatory Visit: Payer: BC Managed Care – PPO | Admitting: Podiatrist

## 2014-02-25 ENCOUNTER — Ambulatory Visit (INDEPENDENT_AMBULATORY_CARE_PROVIDER_SITE_OTHER): Payer: BC Managed Care – PPO | Admitting: *Deleted

## 2014-02-25 DIAGNOSIS — Z23 Encounter for immunization: Secondary | ICD-10-CM | POA: Diagnosis not present

## 2014-03-07 ENCOUNTER — Ambulatory Visit: Payer: BC Managed Care – PPO | Admitting: Podiatrist

## 2014-03-12 ENCOUNTER — Encounter: Payer: Self-pay | Admitting: Family Medicine

## 2014-03-12 ENCOUNTER — Ambulatory Visit (INDEPENDENT_AMBULATORY_CARE_PROVIDER_SITE_OTHER): Payer: BC Managed Care – PPO | Admitting: Family Medicine

## 2014-03-12 ENCOUNTER — Ambulatory Visit: Payer: BC Managed Care – PPO | Admitting: Family Medicine

## 2014-03-12 VITALS — BP 154/87 | HR 53 | Temp 98.1°F | Ht 68.0 in | Wt 218.0 lb

## 2014-03-12 DIAGNOSIS — M79671 Pain in right foot: Secondary | ICD-10-CM

## 2014-03-12 DIAGNOSIS — M25551 Pain in right hip: Secondary | ICD-10-CM

## 2014-03-12 DIAGNOSIS — M545 Low back pain, unspecified: Secondary | ICD-10-CM

## 2014-03-12 DIAGNOSIS — I10 Essential (primary) hypertension: Secondary | ICD-10-CM

## 2014-03-12 MED ORDER — GABAPENTIN 100 MG PO CAPS: 100.0000 mg | ORAL_CAPSULE | Freq: Three times a day (TID) | ORAL | Status: DC

## 2014-03-12 NOTE — Patient Instructions (Addendum)
Good to see you today!  Thanks for coming in.  Use stretching and massage and heat for the back and thigh - consider a chiropracter  Try a arch support for the foot  The gabapentin take 100 mg three times daily if makes you sleepy take at night.   If after one week it is not helping then go up to 2 capsules three times daily  Take tylenol with other pain medications take it first as is the safest  Check your blood pressure at work if regularly > 140/90 then call

## 2014-03-12 NOTE — Progress Notes (Signed)
   Subjective:    Patient ID: [name], female    DOB: 1966-01-11, 48 y.o.   MRN: [id number]  HPI  R Thigh Pain Lateral anterior mid thigh for several weeks.  No trauma.  No mass.  Aches to touch.  No radiation.  No weakness or change in sensation  R Back Pain Mid lateral thoracic area.  Has been lifting wts that might have worsened.  No trauma.  No radiation.  No shortness of breath or cough or chest pain or pleuritic component.  No rash  Pain in R foot Aching burning pain in top mid lateral foot.  Status post injection from podiatrist for DJD.   Helped only a little.    Medications - Taking ibuprofen as needed and tramadol  HYPERTENSION Disease Monitoring Home BP Monitoring often systolic in 597C Chest pain- no    Dyspnea- no Medications Compliance-  Daily . Lightheadedness-  no  Edema- no ROS - See HPI  PMH Lab Review   POTASSIUM  Date Value Ref Range Status  11/08/2013 3.7 3.5 - 5.3 mEq/L Final  03/18/2009 3.0* 3.3 - 4.7 mEq/L Final   SODIUM  Date Value Ref Range Status  11/08/2013 135 135 - 145 mEq/L Final  03/18/2009 136 128 - 145 mEq/L Final   CREAT  Date Value Ref Range Status  11/08/2013 0.82 0.50 - 1.10 mg/dL Final   CREATININE, SER  Date Value Ref Range Status  11/17/2012 0.70 0.50 - 1.10 mg/dL Final       Chief Complaint noted Review of Symptoms - see HPI PMH - Smoking status noted.   Vital Signs reviewed    Review of Systems     Objective:   Physical Exam Back - focally tender with muscle spasm R lateral thoracic paraspinous muscle.  No vertebral tenderness.  Skin:  Intact without suspicious lesions or rashes R Thigh - focally area of tenderness without mass mid outer thigh.  left side normal R Foot - area of hypopigmentation mid lateral foot from injection.  Tender over this area.  No deformity Blood pressure rechecked unchanged       Assessment & Plan:   Back Pain - obvious spasm. Likely musculoskeletal from overuse.   Thigh  Pain - seems consistent with musculoskeletal  Pain.  No sign of tumor or neuropathy Foot pain - DJD  Plan Given diffuse pains and that she is already on nsaids and tramadol will try gabapentin with local heat and range of motion

## 2014-03-12 NOTE — Assessment & Plan Note (Signed)
Not at goal.  Maybe due to pain and nsaid use.  Recommend check at work.  If persist will need to increase losartan.   Of note her job is closing Dec 4

## 2014-03-14 NOTE — Addendum Note (Signed)
Addended by: Talbert Cage L on: 03/14/2014 10:39 AM   Modules accepted: SmartSet

## 2014-03-31 ENCOUNTER — Ambulatory Visit (INDEPENDENT_AMBULATORY_CARE_PROVIDER_SITE_OTHER): Payer: BC Managed Care – PPO | Admitting: *Deleted

## 2014-03-31 DIAGNOSIS — Z111 Encounter for screening for respiratory tuberculosis: Secondary | ICD-10-CM

## 2014-03-31 NOTE — Progress Notes (Signed)
   PPD placed Left Forearm.  Pt to return 48-72 hours for reading.  Pt tolerated intradermal injection. Derl Barrow, RN

## 2014-04-02 ENCOUNTER — Encounter: Payer: Self-pay | Admitting: *Deleted

## 2014-04-02 ENCOUNTER — Ambulatory Visit (INDEPENDENT_AMBULATORY_CARE_PROVIDER_SITE_OTHER): Payer: BC Managed Care – PPO | Admitting: *Deleted

## 2014-04-02 DIAGNOSIS — Z7689 Persons encountering health services in other specified circumstances: Secondary | ICD-10-CM

## 2014-04-02 DIAGNOSIS — Z111 Encounter for screening for respiratory tuberculosis: Secondary | ICD-10-CM

## 2014-04-02 LAB — TB SKIN TEST
INDURATION: 0 mm
TB Skin Test: NEGATIVE

## 2014-04-02 NOTE — Progress Notes (Signed)
   PPD Reading Note PPD read and results entered in EpicCare. Result: 0 mm induration. Interpretation: Negative Hurschel Paynter L, RN  

## 2014-08-16 ENCOUNTER — Encounter (HOSPITAL_COMMUNITY): Payer: Self-pay | Admitting: Emergency Medicine

## 2014-08-16 ENCOUNTER — Emergency Department (HOSPITAL_COMMUNITY)
Admission: EM | Admit: 2014-08-16 | Discharge: 2014-08-16 | Disposition: A | Discharge status: Home / Self Care | Payer: Self-pay | Attending: Emergency Medicine | Admitting: Emergency Medicine

## 2014-08-16 ENCOUNTER — Emergency Department (HOSPITAL_COMMUNITY): Payer: Self-pay

## 2014-08-16 DIAGNOSIS — R10A0 Flank pain, unspecified side: Secondary | ICD-10-CM | POA: Diagnosis present

## 2014-08-16 DIAGNOSIS — Z7982 Long term (current) use of aspirin: Secondary | ICD-10-CM | POA: Insufficient documentation

## 2014-08-16 DIAGNOSIS — Z9851 Tubal ligation status: Secondary | ICD-10-CM | POA: Insufficient documentation

## 2014-08-16 DIAGNOSIS — Z8719 Personal history of other diseases of the digestive system: Secondary | ICD-10-CM | POA: Insufficient documentation

## 2014-08-16 DIAGNOSIS — R319 Hematuria, unspecified: Secondary | ICD-10-CM | POA: Insufficient documentation

## 2014-08-16 DIAGNOSIS — Z8709 Personal history of other diseases of the respiratory system: Secondary | ICD-10-CM | POA: Insufficient documentation

## 2014-08-16 DIAGNOSIS — Z79899 Other long term (current) drug therapy: Secondary | ICD-10-CM | POA: Insufficient documentation

## 2014-08-16 DIAGNOSIS — M1990 Unspecified osteoarthritis, unspecified site: Secondary | ICD-10-CM | POA: Insufficient documentation

## 2014-08-16 DIAGNOSIS — R109 Unspecified abdominal pain: Secondary | ICD-10-CM

## 2014-08-16 DIAGNOSIS — Z3202 Encounter for pregnancy test, result negative: Secondary | ICD-10-CM | POA: Insufficient documentation

## 2014-08-16 DIAGNOSIS — I129 Hypertensive chronic kidney disease with stage 1 through stage 4 chronic kidney disease, or unspecified chronic kidney disease: Secondary | ICD-10-CM | POA: Insufficient documentation

## 2014-08-16 DIAGNOSIS — Z8781 Personal history of (healed) traumatic fracture: Secondary | ICD-10-CM | POA: Insufficient documentation

## 2014-08-16 DIAGNOSIS — N189 Chronic kidney disease, unspecified: Secondary | ICD-10-CM | POA: Insufficient documentation

## 2014-08-16 LAB — BASIC METABOLIC PANEL
Anion gap: 6 (ref 5–15)
BUN: 12 mg/dL (ref 6–23)
CALCIUM: 8.7 mg/dL (ref 8.4–10.5)
CO2: 25 mmol/L (ref 19–32)
Chloride: 108 mmol/L (ref 96–112)
Creatinine, Ser: 0.89 mg/dL (ref 0.50–1.10)
GFR calc Af Amer: 86 mL/min — ABNORMAL LOW (ref >90)
GFR, EST NON AFRICAN AMERICAN: 74 mL/min — AB (ref >90)
Glucose, Bld: 114 mg/dL — ABNORMAL HIGH (ref 70–99)
POTASSIUM: 3.9 mmol/L (ref 3.5–5.1)
Sodium: 139 mmol/L (ref 135–145)

## 2014-08-16 LAB — URINALYSIS, ROUTINE W REFLEX MICROSCOPIC
Bilirubin Urine: NEGATIVE
GLUCOSE, UA: NEGATIVE mg/dL
Ketones, ur: NEGATIVE mg/dL
Leukocytes, UA: NEGATIVE
Nitrite: NEGATIVE
PH: 6 (ref 5.0–8.0)
PROTEIN: NEGATIVE mg/dL
Specific Gravity, Urine: 1.013 (ref 1.005–1.030)
Urobilinogen, UA: 0.2 mg/dL (ref 0.0–1.0)

## 2014-08-16 LAB — URINE MICROSCOPIC-ADD ON

## 2014-08-16 LAB — PREGNANCY, URINE: Preg Test, Ur: NEGATIVE

## 2014-08-16 MED ORDER — ONDANSETRON HCL 4 MG/2ML IJ SOLN
4.0000 mg | Freq: Once | INTRAMUSCULAR | Status: AC
  Administered 2014-08-16: 4 mg via INTRAVENOUS
  Filled 2014-08-16: qty 2

## 2014-08-16 MED ORDER — HYDROCODONE-ACETAMINOPHEN 5-325 MG PO TABS: 1.0000 | ORAL_TABLET | Freq: Four times a day (QID) | ORAL | Status: DC | PRN

## 2014-08-16 MED ORDER — MORPHINE SULFATE 4 MG/ML IJ SOLN
4.0000 mg | Freq: Once | INTRAMUSCULAR | Status: AC
  Administered 2014-08-16: 4 mg via INTRAVENOUS
  Filled 2014-08-16: qty 1

## 2014-08-16 NOTE — ED Notes (Signed)
Pt from home c/o left flan pain that radiates to left lower abdomen. She reports urinary frequency but denies dysuria. She denies Hx of kidney stones. She reports surgery on right kidney for cysts.

## 2014-08-16 NOTE — ED Provider Notes (Signed)
CSN: [id number]     Arrival date & time 08/16/14  0740 History   First MD Initiated Contact with Patient 08/16/14 [phone]     Chief Complaint  Patient presents with  . Flank Pain     (Consider location/radiation/quality/duration/timing/severity/associated sxs/prior Treatment) HPI Comments: 49 yo female with a history of right renal cyst which she is followed by [facility], HTN, and  previous back injury presenting with 3 days of left flank pain radiating to LLQ and left leg.  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  Pain is constant 8/10 described as a pressure and sharp aggravated by movement and ambulation.  Has taken Ibuprofen and applied heat without relief from pain.  Denies fever, chills, urinary frequency, dysuria, hematuria or urgency.  Has intermittent nausea and denies vomiting or diarrhea. Denies vaginal discharge, vaginal bleeding.  Does not have menses due to s/p endometrial ablation and states she is sexually active. States the pain is similar with renal cyst on right side  Patient is a 49 y.o. female presenting with flank pain. The history is provided by the patient.  Flank Pain This is a new problem.    Past Medical History  Diagnosis Date  . Allergy   . Hypertension   . PONV (postoperative nausea and vomiting)     N/V and trouble urinating after surgery   . Chronic kidney disease     right renal cyst   . GERD (gastroesophageal reflux disease)     hx of no problems at present   . Headache(784.0)     occasional headache   . Arthritis     Lt ankle  . Influenza 04/20/2012   Past Surgical History  Procedure Laterality Date  . Breast surgery  2010    lumpectomy Lt breast  . Tubal ligation  1988  . Fracture surgery  2011    Lt ankle surgeries x 3   . Joint replacement      left ankle 8/12   . Other surgical history      2 hand surgeries on left and shoulder surgery on right shoulder   .  Endometrial ablation  October 18 2010    at [facility]  . Laparoscopic nephrectomy  03/09/2011    Procedure: LAPAROSCOPIC NEPHRECTOMY;  Surgeon: Franchot Gallo;  Location: [facility];  Service: Urology;  Laterality: Right;  Laparoscopic Assisted Removal Of Renal Cyst   . Total ankle arthroplasty Left 07/05/2012    Procedure: Removal of total ankle implants,I & D with insertion of  new poly spacer;  Surgeon: Wylene Simmer, MD;  Location: [place];  Service: Orthopedics;  Laterality: Left;   Family History  Problem Relation Age of Onset  . Diabetes Mother   . Heart disease Mother   . Hypertension Father   . Diabetes Sister   . Kidney disease Sister   . Hypertension Brother   . Kidney disease Brother   . Arthritis Brother   . Cancer Maternal Grandmother   . Diabetes Sister   . Hypertension Sister   . Hypertension Sister   . Hypertension Sister   . Hypertension Brother   . Mental illness Brother    History  Substance Use Topics  . Smoking status: Never Smoker   . Smokeless tobacco: Not on file  . Alcohol Use: No   OB History    No data available     Review of Systems  Genitourinary: Positive for flank pain.  All other systems reviewed and are negative.     Allergies  Tetracycline hcl; Septra; and Sulfamethoxazole-trimethoprim  Home Medications   Prior to Admission medications   Medication Sig Start Date End Date Taking? Authorizing Provider  aspirin EC 81 MG tablet Take 81 mg by mouth daily.    Historical Provider, MD  Biotin 5000 MCG TABS Take by mouth.    Historical Provider, MD  gabapentin (NEURONTIN) 100 MG capsule Take 1 capsule (100 mg total) by mouth 3 (three) times daily. 03/12/14   Lind Covert, MD  ibuprofen (ADVIL,MOTRIN) 800 MG tablet Take 1 tablet (800 mg total) by mouth every 6 (six) hours as needed for moderate pain. 10/30/13   Bronson Ing, DPM  losartan (COZAAR) 50 MG tablet Take 1 tablet (50 mg total) by mouth daily. 11/08/13   Lind Covert, MD    spironolactone (ALDACTONE) 25 MG tablet Take 1 tablet (25 mg total) by mouth daily. 11/08/13   Lind Covert, MD  traMADol (ULTRAM) 50 MG tablet Take 50 mg by mouth every 6 (six) hours as needed for moderate pain.    Historical Provider, MD  vitamin E 400 UNIT capsule Take 400 Units by mouth daily.     Historical Provider, MD   BP 141/69 mmHg  Pulse 65  Temp(Src) 98 F (36.7 C) (Oral)  Resp 17  SpO2 100% Physical Exam  Constitutional: She is oriented to person, place, and time. She appears well-developed and well-nourished.  HENT:  Head: Normocephalic and atraumatic.  Eyes: EOM are normal.  Neck: Normal range of motion. Neck supple.  Cardiovascular: Normal rate, regular rhythm and normal heart sounds.   No murmur heard. Pulmonary/Chest: Effort normal and breath sounds normal. She has no wheezes.  Abdominal: Soft. She exhibits no distension and no mass. There is no hepatosplenomegaly. There is tenderness. There is CVA tenderness. There is no rebound.    mildy tender in lower left quadrant without guarding  Musculoskeletal: Normal range of motion.  ttp in left lumbar paraspinal area  Lymphadenopathy:    She has no cervical adenopathy.  Neurological: She is alert and oriented to person, place, and time. Coordination normal.  Skin: Skin is warm and dry.  Psychiatric: She has a normal mood and affect.    ED Course  Procedures (including critical care time) Labs Review Labs Reviewed  URINALYSIS, ROUTINE W REFLEX MICROSCOPIC - Abnormal; Notable for the following:    Hgb urine dipstick TRACE (*)    All other components within normal limits  BASIC METABOLIC PANEL - Abnormal; Notable for the following:    Glucose, Bld 114 (*)    GFR calc non Af Amer 74 (*)    GFR calc Af Amer 86 (*)    All other components within normal limits  URINE MICROSCOPIC-ADD ON - Abnormal; Notable for the following:    Squamous Epithelial / LPF FEW (*)    All other components within normal limits   PREGNANCY, URINE    Imaging Review Ct Abdomen Pelvis Wo Contrast  08/16/2014   CLINICAL DATA:  Left flank pain  EXAM: CT ABDOMEN AND PELVIS WITHOUT CONTRAST  TECHNIQUE: Multidetector CT imaging of the abdomen and pelvis was performed following the standard protocol without IV contrast.  COMPARISON:  04/02/2008  FINDINGS: Lung bases are free of acute infiltrate or sizable effusion.  The liver is well visualized and within normal limits. The spleen, adrenal glands and pancreas are also within normal limits. The gallbladder is well distended demonstrates  a single dependent gallstone. No wall thickening or pericholecystic fluid is noted.  The kidneys are well visualized bilaterally. Left kidney demonstrates no mass lesion or hydronephrosis. No renal calculi are seen. The right kidney again demonstrates a cystic lesion which has regressed significantly from the prior exam. It now measures approximately 4.0 x 3.4 cm. It previously measured 9.6 x 8.8 cm. No calculi or hydronephrosis is identified. Ureters are well visualized bilaterally without acute abnormality. The bladder is partially distended.  The appendix is within normal limits. Mild diverticular change is noted without diverticulitis. No pelvic mass lesion or sidewall abnormality is noted. No free pelvic fluid is seen. The osseous structures show no acute abnormality.  IMPRESSION: Somewhat complex cystic lesion within the right kidney which has shown significant regression when compared with the prior CT examination of 2009. The overall appearance is similar to recent ultrasound from 2015.  No other focal abnormality is noted.   Electronically Signed   By: Inez Catalina M.D.   On: 08/16/2014 09:21     EKG Interpretation None      MDM   Final diagnoses:  Flank pain   Left flank pain- Renal focused CT positive for right renal cyst; negative for renal calculi  renal function normal:potentially musculoskeletal in nature. Discussed follow up with  pcp    Glendell Docker, NP 08/16/14 [place], MD 08/16/14 1650

## 2014-08-16 NOTE — Discharge Instructions (Signed)
Continue Ibuprofen as directed.   Apply ice 20 minutes 3-4 times daily. Hydrocodone for moderate to severe pain.

## 2014-09-12 ENCOUNTER — Encounter: Payer: Self-pay | Admitting: Family Medicine

## 2014-09-12 NOTE — Progress Notes (Signed)
Patient is dropping off form so that she may receive her handicap placard. Please contact the pt at [phone] when it is ready to be picked up. Thank you, Fonda Kinder, ASA

## 2014-09-15 NOTE — Progress Notes (Signed)
Placed form in MD's box for completion. Deloyd Handy, CMA.

## 2014-09-15 NOTE — Progress Notes (Signed)
LMOVM stating we needed to know the reason for the handicap sticker and contact office. Dellia Donnelly, CMA.

## 2014-09-15 NOTE — Progress Notes (Signed)
Please call patient to find out why needs handiap parking.  If for ortho problem she would need to have her ortho dr fill out form  Thanks

## 2014-09-16 NOTE — Progress Notes (Signed)
Spoke with patient and she has already picked this up. [name],CMA

## 2014-09-30 ENCOUNTER — Other Ambulatory Visit: Payer: Self-pay | Admitting: Family Medicine

## 2014-10-01 ENCOUNTER — Other Ambulatory Visit: Payer: Self-pay | Admitting: Family Medicine

## 2014-10-01 DIAGNOSIS — R921 Mammographic calcification found on diagnostic imaging of breast: Secondary | ICD-10-CM

## 2014-10-01 DIAGNOSIS — N6310 Unspecified lump in the right breast, unspecified quadrant: Secondary | ICD-10-CM

## 2014-10-05 ENCOUNTER — Other Ambulatory Visit: Payer: Self-pay | Admitting: Family Medicine

## 2014-10-06 ENCOUNTER — Ambulatory Visit
Admission: RE | Admit: 2014-10-06 | Discharge: 2014-10-06 | Disposition: A | Discharge status: Home / Self Care | Payer: Managed Care, Other (non HMO) | Source: Ambulatory Visit | Attending: Family Medicine | Admitting: Family Medicine

## 2014-10-06 DIAGNOSIS — N6310 Unspecified lump in the right breast, unspecified quadrant: Secondary | ICD-10-CM

## 2014-10-06 DIAGNOSIS — R921 Mammographic calcification found on diagnostic imaging of breast: Secondary | ICD-10-CM

## 2014-10-21 ENCOUNTER — Emergency Department (HOSPITAL_COMMUNITY)
Admission: EM | Admit: 2014-10-21 | Discharge: 2014-10-22 | Disposition: A | Discharge status: Home / Self Care | Payer: Managed Care, Other (non HMO) | Attending: Emergency Medicine | Admitting: Emergency Medicine

## 2014-10-21 DIAGNOSIS — Z8719 Personal history of other diseases of the digestive system: Secondary | ICD-10-CM | POA: Insufficient documentation

## 2014-10-21 DIAGNOSIS — Z8709 Personal history of other diseases of the respiratory system: Secondary | ICD-10-CM | POA: Insufficient documentation

## 2014-10-21 DIAGNOSIS — Z79899 Other long term (current) drug therapy: Secondary | ICD-10-CM | POA: Diagnosis not present

## 2014-10-21 DIAGNOSIS — M25572 Pain in left ankle and joints of left foot: Secondary | ICD-10-CM | POA: Diagnosis not present

## 2014-10-21 DIAGNOSIS — R2242 Localized swelling, mass and lump, left lower limb: Secondary | ICD-10-CM | POA: Diagnosis not present

## 2014-10-21 DIAGNOSIS — Z7982 Long term (current) use of aspirin: Secondary | ICD-10-CM | POA: Diagnosis not present

## 2014-10-21 DIAGNOSIS — I129 Hypertensive chronic kidney disease with stage 1 through stage 4 chronic kidney disease, or unspecified chronic kidney disease: Secondary | ICD-10-CM | POA: Diagnosis not present

## 2014-10-21 DIAGNOSIS — M1990 Unspecified osteoarthritis, unspecified site: Secondary | ICD-10-CM | POA: Diagnosis not present

## 2014-10-21 DIAGNOSIS — M25473 Effusion, unspecified ankle: Secondary | ICD-10-CM

## 2014-10-21 DIAGNOSIS — N189 Chronic kidney disease, unspecified: Secondary | ICD-10-CM | POA: Insufficient documentation

## 2014-10-21 NOTE — ED Provider Notes (Signed)
CSN: [id number]     Arrival date & time 10/21/14  2324 History  This chart was scribed for Linton Flemings, MD by Rayfield Citizen, ED Scribe. This patient was seen in room D31C/D31C and the patient's care was started at 12:39 AM.    Chief Complaint  Patient presents with  . Joint Swelling   The history is provided by the patient. No language interpreter was used.    HPI Comments: [name] is a 49 y.o. female who presents to the Emergency Department complaining of 1 month of "aching" pain  and swelling to the left ankle (medial > lateral), radiating as a "shooting pain" up into her leg. She notes several recent instances of "twisting" this ankle; she describes the swelling as "feeling as though its ready to pop," which concerned her enough to come to the ED tonight. Patient notes some swelling at baseline, worse when on her feet; she recently began a new job where she has been seated more but has still noted increased swelling to the ankle. Patient has been taking tylenol without significant relief. She denies recent fevers or chills. Patient states she had an ankle replacement in 2012-2013 after an injury during a fall; she had an infection several years ago but denies any significant trauma or injury to the ankle within the past several months.   Prior ankle surgeries by Dr. Georgena Spurling. Patient takes losartan and spironolactone daily.   Past Medical History  Diagnosis Date  . Allergy   . Hypertension   . PONV (postoperative nausea and vomiting)     N/V and trouble urinating after surgery   . Chronic kidney disease     right renal cyst   . GERD (gastroesophageal reflux disease)     hx of no problems at present   . Headache(784.0)     occasional headache   . Arthritis     Lt ankle  . Influenza 04/20/2012   Past Surgical History  Procedure Laterality Date  . Breast surgery  2010    lumpectomy Lt breast  . Tubal ligation  1988  . Fracture surgery  2011    Lt ankle surgeries x 3   . Joint  replacement      left ankle 8/12   . Other surgical history      2 hand surgeries on left and shoulder surgery on right shoulder   . Endometrial ablation  October 18 2010    at [facility]  . Laparoscopic nephrectomy  03/09/2011    Procedure: LAPAROSCOPIC NEPHRECTOMY;  Surgeon: Franchot Gallo;  Location: [facility];  Service: Urology;  Laterality: Right;  Laparoscopic Assisted Removal Of Renal Cyst   . Total ankle arthroplasty Left 07/05/2012    Procedure: Removal of total ankle implants,I & D with insertion of  new poly spacer;  Surgeon: Wylene Simmer, MD;  Location: [place];  Service: Orthopedics;  Laterality: Left;   Family History  Problem Relation Age of Onset  . Diabetes Mother   . Heart disease Mother   . Hypertension Father   . Diabetes Sister   . Kidney disease Sister   . Hypertension Brother   . Kidney disease Brother   . Arthritis Brother   . Cancer Maternal Grandmother   . Diabetes Sister   . Hypertension Sister   . Hypertension Sister   . Hypertension Sister   . Hypertension Brother   . Mental illness Brother    History  Substance Use Topics  . Smoking status: Never Smoker   .  Smokeless tobacco: Never Used  . Alcohol Use: Yes     Comment: socially   OB History    No data available     Review of Systems  Constitutional: Negative for fever and chills.  Musculoskeletal: Positive for joint swelling.       Pain and swelling to left ankle  All other systems reviewed and are negative.  Allergies  Tetracycline hcl; Septra; and Sulfamethoxazole-trimethoprim  Home Medications   Prior to Admission medications   Medication Sig Start Date End Date Taking? Authorizing Provider  aspirin EC 81 MG tablet Take 81 mg by mouth daily.   Yes Historical Provider, MD  Biotin 5000 MCG TABS Take 1 tablet by mouth daily.    Yes Historical Provider, MD  losartan (COZAAR) 50 MG tablet Take 1 tablet (50 mg total) by mouth daily. 11/08/13  Yes Lind Covert, MD  spironolactone (ALDACTONE)  25 MG tablet TAKE 1 TABLET (25 MG TOTAL) BY MOUTH DAILY. 10/06/14  Yes Lind Covert, MD  vitamin E 400 UNIT capsule Take 400 Units by mouth daily.    Yes Historical Provider, MD  gabapentin (NEURONTIN) 100 MG capsule Take 1 capsule (100 mg total) by mouth 3 (three) times daily. Patient not taking: Reported on 10/22/2014 03/12/14   Lind Covert, MD  HYDROcodone-acetaminophen (NORCO/VICODIN) 5-325 MG per tablet Take 1-2 tablets by mouth every 6 (six) hours as needed. Patient not taking: Reported on 10/22/2014 08/16/14   Glendell Docker, NP  ibuprofen (ADVIL,MOTRIN) 800 MG tablet Take 1 tablet (800 mg total) by mouth every 6 (six) hours as needed for moderate pain. Patient not taking: Reported on 10/22/2014 10/30/13   Bronson Ing, DPM   BP 137/81 mmHg  Pulse 49  Temp(Src) 98.9 F (37.2 C) (Oral)  SpO2 100% Physical Exam  Constitutional: She is oriented to person, place, and time. She appears well-developed and well-nourished. No distress.  HENT:  Head: Normocephalic and atraumatic.  Eyes: EOM are normal. Pupils are equal, round, and reactive to light.  Neck: Normal range of motion. Neck supple. No JVD present.  Cardiovascular: Normal rate, regular rhythm and normal heart sounds.  Exam reveals no gallop and no friction rub.   No murmur heard. Pulmonary/Chest: Effort normal and breath sounds normal. No respiratory distress. She has no wheezes. She has no rales.  Abdominal: Soft. Bowel sounds are normal. She exhibits no mass. There is no tenderness. There is no rebound and no guarding.  Musculoskeletal: Normal range of motion. She exhibits edema and tenderness.  Moves all extremities normally. Sweling bilaterally malleoli, ttp over the medial malleoli   Neurological: She is alert and oriented to person, place, and time. She displays normal reflexes.  Skin: Skin is warm and dry. No rash noted.  Psychiatric: She has a normal mood and affect. Her behavior is normal.  Nursing note  and vitals reviewed.   ED Course  Procedures   DIAGNOSTIC STUDIES: Oxygen Saturation is 99% on RA, normal by my interpretation.    COORDINATION OF CARE: 12:45 AM Discussed treatment plan with pt at bedside and pt agreed to plan.   Labs Review Labs Reviewed - No data to display  Imaging Review Dg Ankle Complete Left  10/22/2014   CLINICAL DATA:  Left ankle joint swelling and pain for 1 month. Initial encounter.  EXAM: LEFT ANKLE COMPLETE - 3+ VIEW  COMPARISON:  Left ankle radiographs performed 07/08/2013  FINDINGS: The patient's tibiotalar prosthesis is grossly stable in appearance. No definite osseous erosion  is characterized. Mildly worsened diffuse soft tissue swelling is noted about the ankle.  Plantar and posterior calcaneal spurs are seen. The subtalar joint is grossly unremarkable. Mild degenerative change is noted about the midfoot.  IMPRESSION: Tibiotalar prosthesis is grossly stable in appearance. No definite osseous erosion seen on radiograph. Mildly worsened diffuse soft tissue swelling noted about the ankle.   Electronically Signed   By: Garald Balding M.D.   On: 10/22/2014 01:44     EKG Interpretation None      MDM   Final diagnoses:  Ankle pain, left  Ankle edema    I personally performed the services described in this documentation, which was scribed in my presence. The recorded information has been reviewed and is accurate.  Pt with acute on chronic ankle pain s/p prior surgeries.  Possible injury earlier this month.  Swelling noted, no crepitus.  Will start RICE therapy, naprosyn, f/u with Ortho     Linton Flemings, MD 10/22/14 [phone]

## 2014-10-22 ENCOUNTER — Encounter (HOSPITAL_COMMUNITY): Payer: Self-pay | Admitting: *Deleted

## 2014-10-22 ENCOUNTER — Emergency Department (HOSPITAL_COMMUNITY): Payer: Managed Care, Other (non HMO)

## 2014-10-22 MED ORDER — NAPROXEN 500 MG PO TABS: 500.0000 mg | ORAL_TABLET | Freq: Two times a day (BID) | ORAL | Status: DC

## 2014-10-22 MED ORDER — NAPROXEN 250 MG PO TABS
500.0000 mg | ORAL_TABLET | Freq: Once | ORAL | Status: AC
  Administered 2014-10-22: 500 mg via ORAL
  Filled 2014-10-22: qty 2

## 2014-10-22 NOTE — ED Notes (Signed)
Pt to Xray.

## 2014-10-22 NOTE — Discharge Instructions (Signed)
Please wear your prior brace.  Contact your orthopedic surgeon for recheck if not improving with Rest, Ice, Compression, Elevation (RICE).  Take naprosyn as prescribed.   Ankle Pain Ankle pain is a common symptom. The bones, cartilage, tendons, and muscles of the ankle joint perform a lot of work each day. The ankle joint holds your body weight and allows you to move around. Ankle pain can occur on either side or back of 1 or both ankles. Ankle pain may be sharp and burning or dull and aching. There may be tenderness, stiffness, redness, or warmth around the ankle. The pain occurs more often when a person walks or puts pressure on the ankle. CAUSES  There are many reasons ankle pain can develop. It is important to work with your caregiver to identify the cause since many conditions can impact the bones, cartilage, muscles, and tendons. Causes for ankle pain include:  Injury, including a break (fracture), sprain, or strain often due to a fall, sports, or a high-impact activity.  Swelling (inflammation) of a tendon (tendonitis).  Achilles tendon rupture.  Ankle instability after repeated sprains and strains.  Poor foot alignment.  Pressure on a nerve (tarsal tunnel syndrome).  Arthritis in the ankle or the lining of the ankle.  Crystal formation in the ankle (gout or pseudogout). DIAGNOSIS  A diagnosis is based on your medical history, your symptoms, results of your physical exam, and results of diagnostic tests. Diagnostic tests may include X-ray exams or a computerized magnetic scan (magnetic resonance imaging, MRI). TREATMENT  Treatment will depend on the cause of your ankle pain and may include:  Keeping pressure off the ankle and limiting activities.  Using crutches or other walking support (a cane or brace).  Using rest, ice, compression, and elevation.  Participating in physical therapy or home exercises.  Wearing shoe inserts or special shoes.  Losing weight.  Taking  medications to reduce pain or swelling or receiving an injection.  Undergoing surgery. HOME CARE INSTRUCTIONS   Only take over-the-counter or prescription medicines for pain, discomfort, or fever as directed by your caregiver.  Put ice on the injured area.  Put ice in a plastic bag.  Place a towel between your skin and the bag.  Leave the ice on for 15-20 minutes at a time, 03-04 times a day.  Keep your leg raised (elevated) when possible to lessen swelling.  Avoid activities that cause ankle pain.  Follow specific exercises as directed by your caregiver.  Record how often you have ankle pain, the location of the pain, and what it feels like. This information may be helpful to you and your caregiver.  Ask your caregiver about returning to work or sports and whether you should drive.  Follow up with your caregiver for further examination, therapy, or testing as directed. SEEK MEDICAL CARE IF:   Pain or swelling continues or worsens beyond 1 week.  You have an oral temperature above 102 F (38.9 C).  You are feeling unwell or have chills.  You are having an increasingly difficult time with walking.  You have loss of sensation or other new symptoms.  You have questions or concerns. MAKE SURE YOU:   Understand these instructions.  Will watch your condition.  Will get help right away if you are not doing well or get worse. Document Released: 10/06/2009 Document Revised: 07/11/2011 Document Reviewed: 10/06/2009 [facility] Patient Information 2015 [place], [place]. This information is not intended to replace advice given to you by your health care  provider. Make sure you discuss any questions you have with your health care provider.  Cryotherapy Cryotherapy is when you put ice on your injury. Ice helps lessen pain and puffiness (swelling) after an injury. Ice works the best when you start using it in the first 24 to 48 hours after an injury. HOME CARE  Put a dry or damp  towel between the ice pack and your skin.  You may press gently on the ice pack.  Leave the ice on for no more than 10 to 20 minutes at a time.  Check your skin after 5 minutes to make sure your skin is okay.  Rest at least 20 minutes between ice pack uses.  Stop using ice when your skin loses feeling (numbness).  Do not use ice on someone who cannot tell you when it hurts. This includes small children and people with memory problems (dementia). GET HELP RIGHT AWAY IF:  You have white spots on your skin.  Your skin turns blue or pale.  Your skin feels waxy or hard.  Your puffiness gets worse. MAKE SURE YOU:   Understand these instructions.  Will watch your condition.  Will get help right away if you are not doing well or get worse. Document Released: 10/05/2007 Document Revised: 07/11/2011 Document Reviewed: 12/09/2010 [facility] Patient Information 2015 [place], [place]. This information is not intended to replace advice given to you by your health care provider. Make sure you discuss any questions you have with your health care provider.

## 2014-10-22 NOTE — ED Notes (Signed)
Patient presents with swelling to the left ankle.  Has had surgery to that ankle a couple years ago but no new injury.  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  Ankle soft to touch, no warmth noted.

## 2014-10-22 NOTE — ED Notes (Signed)
Pt here fr swelling to left ankle and pain, pt sts she had a replacement  in 2012-2013 and infection a few years ago but today presents with swelling and pain to ankle, no known injury.

## 2014-11-05 ENCOUNTER — Ambulatory Visit (INDEPENDENT_AMBULATORY_CARE_PROVIDER_SITE_OTHER): Payer: Managed Care, Other (non HMO) | Admitting: Family Medicine

## 2014-11-05 ENCOUNTER — Encounter: Payer: Self-pay | Admitting: Family Medicine

## 2014-11-05 ENCOUNTER — Other Ambulatory Visit (HOSPITAL_COMMUNITY)
Admission: RE | Admit: 2014-11-05 | Discharge: 2014-11-05 | Disposition: A | Discharge status: Home / Self Care | Payer: Managed Care, Other (non HMO) | Source: Ambulatory Visit | Attending: Family Medicine | Admitting: Family Medicine

## 2014-11-05 VITALS — BP 149/69 | HR 57 | Temp 98.1°F | Ht 68.0 in | Wt 223.1 lb

## 2014-11-05 DIAGNOSIS — N281 Cyst of kidney, acquired: Secondary | ICD-10-CM

## 2014-11-05 DIAGNOSIS — I10 Essential (primary) hypertension: Secondary | ICD-10-CM

## 2014-11-05 DIAGNOSIS — E669 Obesity, unspecified: Secondary | ICD-10-CM | POA: Diagnosis not present

## 2014-11-05 DIAGNOSIS — Z1151 Encounter for screening for human papillomavirus (HPV): Secondary | ICD-10-CM | POA: Insufficient documentation

## 2014-11-05 DIAGNOSIS — Z01419 Encounter for gynecological examination (general) (routine) without abnormal findings: Secondary | ICD-10-CM | POA: Insufficient documentation

## 2014-11-05 NOTE — Patient Instructions (Addendum)
Good to see you today!  Thanks for coming in.  You are healthy!  You need to get a colonoscopy - call to set up an appointment  Given your family history you are at risk for heart disease and especially diabetes - you can best prevent these by controlling your weight with diet and exercise and controlling your blood pressure   Consider diet drinks or just water

## 2014-11-05 NOTE — Assessment & Plan Note (Signed)
Adequate control BP Readings from Last 3 Encounters:  11/05/14 149/69  10/22/14 140/67  08/16/14 141/69

## 2014-11-05 NOTE — Addendum Note (Signed)
Addended by: Charlton Haws on: 11/05/2014 09:45 AM   Modules accepted: Orders

## 2014-11-05 NOTE — Assessment & Plan Note (Signed)
Discussed plans see AVS

## 2014-11-05 NOTE — Progress Notes (Signed)
   Subjective:    Patient ID: [name], female    DOB: 1966/02/24, 49 y.o.   MRN: [id number]  HPI  Feels well no complatints  Patient reports no  vision/ hearing changes,anorexia, weight change, fever ,adenopathy, persistant / recurrent hoarseness, swallowing issues, chest pain, edema,persistant / recurrent cough, hemoptysis, dyspnea(rest, exertional, paroxysmal nocturnal), gastrointestinal  bleeding (melena, rectal bleeding), abdominal pain, excessive heart burn, GU symptoms(dysuria, hematuria, pyuria, voiding/incontinence  Issues) syncope, focal weakness, severe memory loss, concerning skin lesions, depression, anxiety, abnormal bruising/bleeding, major joint swelling, breast masses or abnormal vaginal bleeding.     FHX - diabetes and heart disease in mom  Chief Complaint noted Review of Symptoms - see HPI PMH - Smoking status noted.   Vital Signs reviewed   Review of Systems     Objective:   Physical Exam  Alert no acute distress Neck:  No deformities, thyromegaly, masses, or tenderness noted.   Supple with full range of motion without pain. Heart - Regular rate and rhythm.  No murmurs, gallops or rubs.    Lungs:  Normal respiratory effort, chest expands symmetrically. Lungs are clear to auscultation, no crackles or wheezes. Abdomen: soft and non-tender without masses, organomegaly or hernias noted.  No guarding or rebound Genitalia:  Normal introitus for age, no external lesions, no vaginal discharge, mucosa pink and moist, no vaginal or cervical lesions, no vaginal atrophy, no friaility or hemorrhage, normal uterus size and position, no adnexal masses or tenderness Extremities:  No cyanosis, edema, or deformity noted with good range of motion of all major joints.   Skin:  Intact without suspicious lesions or rashes      Assessment & Plan:

## 2014-11-06 LAB — CYTOLOGY - PAP

## 2014-11-11 ENCOUNTER — Other Ambulatory Visit: Payer: Self-pay | Admitting: Family Medicine

## 2014-12-30 ENCOUNTER — Encounter (HOSPITAL_COMMUNITY): Payer: Self-pay | Admitting: *Deleted

## 2014-12-30 ENCOUNTER — Other Ambulatory Visit: Payer: Self-pay | Admitting: Family Medicine

## 2014-12-30 ENCOUNTER — Emergency Department (HOSPITAL_COMMUNITY)
Admission: EM | Admit: 2014-12-30 | Discharge: 2014-12-30 | Disposition: A | Discharge status: Home / Self Care | Payer: Managed Care, Other (non HMO) | Attending: Emergency Medicine | Admitting: Emergency Medicine

## 2014-12-30 ENCOUNTER — Emergency Department (HOSPITAL_COMMUNITY): Payer: Managed Care, Other (non HMO)

## 2014-12-30 DIAGNOSIS — M779 Enthesopathy, unspecified: Secondary | ICD-10-CM

## 2014-12-30 DIAGNOSIS — I129 Hypertensive chronic kidney disease with stage 1 through stage 4 chronic kidney disease, or unspecified chronic kidney disease: Secondary | ICD-10-CM | POA: Insufficient documentation

## 2014-12-30 DIAGNOSIS — N189 Chronic kidney disease, unspecified: Secondary | ICD-10-CM | POA: Insufficient documentation

## 2014-12-30 DIAGNOSIS — M1990 Unspecified osteoarthritis, unspecified site: Secondary | ICD-10-CM | POA: Insufficient documentation

## 2014-12-30 DIAGNOSIS — Z791 Long term (current) use of non-steroidal anti-inflammatories (NSAID): Secondary | ICD-10-CM | POA: Insufficient documentation

## 2014-12-30 DIAGNOSIS — Z79899 Other long term (current) drug therapy: Secondary | ICD-10-CM | POA: Insufficient documentation

## 2014-12-30 DIAGNOSIS — M778 Other enthesopathies, not elsewhere classified: Secondary | ICD-10-CM | POA: Insufficient documentation

## 2014-12-30 DIAGNOSIS — Z7982 Long term (current) use of aspirin: Secondary | ICD-10-CM | POA: Insufficient documentation

## 2014-12-30 NOTE — ED Notes (Signed)
Pt reports Rt wrist pain for 2 weeks. Pt's job requires packing boxes.

## 2014-12-30 NOTE — ED Provider Notes (Signed)
CSN: [id number]     Arrival date & time 12/30/14  0920 History  This chart was scribed for non-physician practitioner, Glendell Docker, NP working with Leo Grosser, MD, by Erling Conte, ED Scribe. This patient was seen in room TR09C/TR09C and the patient's care was started at 9:30 AM.     Chief Complaint  Patient presents with  . Wrist Pain    The history is provided by the patient. No language interpreter was used.     HPI Comments: [name] is a 49 y.o. female who presents to the Emergency Department complaining of constant, moderate, right wrist pain onset 1 week. Pt reports the pain radiates into her right thumb. She states she had a prior injury to the wrist and dislocated it several years ago. Pt has been wearing a soft wrist brace with mild relief. 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 She has not taken any meds PTA. She denies any swelling, numbness, or weakness to the area.    Past Medical History  Diagnosis Date  . Allergy   . Hypertension   . PONV (postoperative nausea and vomiting)     N/V and trouble urinating after surgery   . Chronic kidney disease     right renal cyst   . GERD (gastroesophageal reflux disease)     hx of no problems at present   . Headache(784.0)     occasional headache   . Arthritis     Lt ankle  . Influenza 04/20/2012   Past Surgical History  Procedure Laterality Date  . Breast surgery  2010    lumpectomy Lt breast  . Tubal ligation  1988  . Fracture surgery  2011    Lt ankle surgeries x 3   . Joint replacement      left ankle 8/12   . Other surgical history      2 hand surgeries on left and shoulder surgery on right shoulder   . Endometrial ablation  October 18 2010    at [facility]  . Laparoscopic nephrectomy  03/09/2011    Procedure: LAPAROSCOPIC NEPHRECTOMY;  Surgeon: Franchot Gallo;  Location: [facility];  Service: Urology;  Laterality: Right;  Laparoscopic Assisted Removal Of Renal  Cyst   . Total ankle arthroplasty Left 07/05/2012    Procedure: Removal of total ankle implants,I & D with insertion of  new poly spacer;  Surgeon: Wylene Simmer, MD;  Location: [facility];  Service: Orthopedics;  Laterality: Left;   Family History  Problem Relation Age of Onset  . Diabetes Mother   . Heart disease Mother   . Hypertension Father   . Diabetes Sister   . Kidney disease Sister   . Hypertension Brother   . Kidney disease Brother   . Arthritis Brother   . Cancer Maternal Grandmother   . Diabetes Sister   . Hypertension Sister   . Hypertension Sister   . Hypertension Sister   . Hypertension Brother   . Mental illness Brother    Social History  Substance Use Topics  . Smoking status: Never Smoker   . Smokeless tobacco: Never Used  . Alcohol Use: Yes     Comment: socially   OB History    No data available     Review of Systems  Musculoskeletal: Positive for myalgias and arthralgias. Negative for joint swelling.  Neurological: Negative for weakness and numbness.  All other systems reviewed and are negative.  Allergies  Tetracycline hcl; Septra; and Sulfamethoxazole-trimethoprim  Home Medications   Prior to Admission medications   Medication Sig Start Date End Date Taking? Authorizing Provider  aspirin EC 81 MG tablet Take 81 mg by mouth daily.    Historical Provider, MD  Biotin 5000 MCG TABS Take 1 tablet by mouth daily.     Historical Provider, MD  losartan (COZAAR) 50 MG tablet TAKE 1 TABLET (50 MG TOTAL) BY MOUTH DAILY. 11/11/14   Lind Covert, MD  naproxen (NAPROSYN) 500 MG tablet Take 1 tablet (500 mg total) by mouth 2 (two) times daily with a meal. 10/22/14   Linton Flemings, MD  spironolactone (ALDACTONE) 25 MG tablet TAKE 1 TABLET (25 MG TOTAL) BY MOUTH DAILY. 10/06/14   Lind Covert, MD  vitamin E 400 UNIT capsule Take 400 Units by mouth daily.     Historical Provider, MD   Triage Vitals: BP 147/67 mmHg  Pulse 62  Temp(Src) 98.3 F (36.8 C)  (Oral)  Resp 20  Wt 215 lb (97.523 kg)  SpO2 98%  Physical Exam  Constitutional: She is oriented to person, place, and time. She appears well-developed and well-nourished. No distress.  HENT:  Head: Normocephalic and atraumatic.  Right Ear: External ear normal.  Left Ear: External ear normal.  Mouth/Throat: Oropharynx is clear and moist.  Eyes: Conjunctivae and EOM are normal.  Neck: Neck supple. No tracheal deviation present.  Cardiovascular: Normal rate, regular rhythm and normal heart sounds.   Pulmonary/Chest: Effort normal and breath sounds normal. No respiratory distress.  Musculoskeletal: Normal range of motion.  Full ROM, no redness or swelling  Neurological: She is alert and oriented to person, place, and time.  Equal grip strength  Skin: Skin is warm and dry.  Psychiatric: She has a normal mood and affect. Her behavior is normal.  Nursing note and vitals reviewed.   ED Course  Procedures (including critical care time)  DIAGNOSTIC STUDIES: Oxygen Saturation is 98% on RA, normal by my interpretation.    COORDINATION OF CARE:  9:41 AM-. Pt advised of plan for treatment and pt agrees. Will order x-ray of right wrist    Labs Review Labs Reviewed - No data to display  Imaging Review Dg Wrist Complete Right  12/30/2014   CLINICAL DATA:  Right wrist pain  EXAM: RIGHT WRIST - COMPLETE 3+ VIEW  COMPARISON:  None.  FINDINGS: There is no evidence of fracture or dislocation. There is no evidence of arthropathy or other focal bone abnormality. Soft tissues are unremarkable.  IMPRESSION: Negative.   Electronically Signed   By: Franchot Gallo M.D.   On: 12/30/2014 10:35   I have personally reviewed and evaluated these images and lab results as part of my medical decision-making.   EKG Interpretation None      MDM   Final diagnoses:  Tendonitis    No bony injury noted. velcro splint for relief. Discussed return precuations  I personally performed the services  described in this documentation, which was scribed in my presence. The recorded information has been reviewed and is accurate.    Glendell Docker, NP 12/30/14 1041  Leo Grosser, MD 01/02/15 [phone]

## 2014-12-30 NOTE — ED Notes (Signed)
Declined W/C at D/C and was escorted to lobby by RN. 

## 2014-12-30 NOTE — Discharge Instructions (Signed)
Repetitive Strain Injuries  Repetitive strain injuries (RSIs) result from overuse or misuse of soft tissues including muscles, tendons, or nerves. Tendons are the cord-like structures that attach muscles to bones. RSIs can affect almost any part of the body. However, RSIs are most common in the arms (thumbs, wrists, elbows, shoulders) and legs (ankles, knees). Common medical conditions that are often caused by repetitive strain include carpal tunnel syndrome, tennis or golfer's elbow, bursitis, and tendonitis. If RSIs are treated early, and therepeated activity is reduced or removed, the severity and length of your problems can usually be reduced. RSIs are also called cumulative trauma disorders (CTD).   CAUSES   Many RSIs occur due to repeating the same activity at work over weeks or months without sufficient rest, such as prolonged typing. RSIs also commonly occur when a hobby or sport is done repeatedly without sufficient rest. RSIs can also occur due to repeated strain or stress on a body part in someone who has one or more risk factors for RSIs.  RISK FACTORS  Workplace risk factors   Frequent computer use, especially if your workstation is not adjusted for your body type.   Infrequent rest breaks.   Working in a high-pressure environment.   Working at a fast pace.   Repeating the same motion, such as frequent typing.   Working in an awkward position or holding the same position for a long time.   Forceful movements such as lifting, pulling, or pushing.   Vibration caused by using power tools.   Working in cold temperatures.   Job stress.  Personal risk factors   Poor posture.   Being loose-jointed.   Not exercising regularly.   Being overweight.   Arthritis, diabetes, thyroid problems, or other long-term (chronic)medical conditions.   Vitamin deficiencies.   Keeping your fingernails long.   An unhealthy, stressful, or inactive lifestyle.   Not sleeping well.  SYMPTOMS   Symptoms often  begin at work but become more noticeable after the repeated stress has ended. For example, you may develop fatigue or soreness in your wrist while typingat work, and at night you may develop numbness and tingling in your fingers. Common symptoms include:    Burning, shooting, or aching pain, especially in the fingers, palms, wrists, forearms, or shoulders.   Tenderness.   Swelling.   Tingling, numbness, or loss of feeling.   Pain with certain activities, such as turning a doorknob or reaching above your head.   Weakness, heaviness, or loss of coordination in yourhand.   Muscle spasms or tightness.  In some cases, symptoms can become so intense that it is difficult to perform everyday tasks. Symptoms that do not improve with rest may indicate a more serious condition.   DIAGNOSIS   Your caregiver may determine the type ofRSI you have based on your medical evaluation and a description of your activities.   TREATMENT   Treatment depends on the severity and type of RSI you have. Your caregiver may recommend rest for the affected body part, medicines, and physical or occupational therapy to reduce pain, swelling, and soreness. Discuss the activities you do repeatedly with your caregiver. Your caregiver can help you decide whether you need to change your activities. An RSI may take months or years to heal, especially if the affected body part gets insufficient rest. In some cases, such as severe carpal tunnel syndrome, surgery may be recommended.  PREVENTION   Talk with your supervisor to make sure you have the proper equipment   cushion in the curve of your lower back.  Shoulders and arms relaxed and at your sides.  Neck relaxed and not bent forwards or backwards.  Your desk and computer workstation  properly adjusted to your body type.  Your chair adjusted so there is no excess pressure on the back of your thighs.  The keyboard resting above your thighs. You should be able to reach the keys with your elbows at your side, bent at a right angle. Your arms should be supported on forearm rests, with your forearms parallel to the ground.  The computer mouse within easy reach.  The monitor directly in front of you, so that your eyes are aligned with the top of the screen. The screen should be about 15 to 25 inches from your eyes.  While typing, keep your wrist straight, in a neutral position. Move your entire arm when you move your mouse or when typing hard-to-reach keys.  Only use your computer as much as you need to for work. Do not use it during breaks.  Take breaks often from any repeated activity. Alternate with another task which requires you to use different muscles, or rest at least once every hour.  Change positions regularly. If you spend a lot of time sitting, get up, walk around, and stretch.  Do not hold pens or pencils tightly when writing.  Exercise regularly.  Maintain a normal weight.  Eat a diet with plenty of vegetables, whole grains, and fruit.  Get sufficient, restful sleep. HOME CARE INSTRUCTIONS  If your caregiver prescribed medicine to help reduce swelling, take it as directed.  Only take over-the-counter or prescription medicines for pain, discomfort, or fever as directed by your caregiver.  Reduce, and if needed, stopthe activities that are causing your problems until you have no further symptoms.If your symptoms are work-related, you may need to talk to your supervisor about changing your activities.  When symptoms develop, put ice or a cold pack on the aching area.  Put ice in a plastic bag.  Place a towel between your skin and the bag.  Leave the ice on for 15-20 minutes.  If you were given a splint to keep your wrist from bending, wear it as  instructed. It is important to wear the splint at night. Use the splint for as long as your caregiver recommends. SEEK MEDICAL CARE IF:  You develop new problems.  Your problems do not get better with medicine. MAKE SURE YOU:  Understand these instructions.  Will watch your condition.  Will get help right away if you are not doing well or get worse. Document Released: 04/08/2002 Document Revised: 10/18/2011 Document Reviewed: 06/09/2011 [facility] Patient Information 2015 [place], [place]. This information is not intended to replace advice given to you by your health care provider. Make sure you discuss any questions you have with your health care provider.  Tendinitis Tendinitis is swelling and inflammation of the tendons. Tendons are band-like tissues that connect muscle to bone. Tendinitis commonly occurs in the:   Shoulders (rotator cuff).  Heels (Achilles tendon).  Elbows (triceps tendon). CAUSES Tendinitis is usually caused by overusing the tendon, muscles, and joints involved. When the tissue surrounding a tendon (synovium) becomes inflamed, it is called tenosynovitis. Tendinitis commonly develops in people whose jobs require repetitive motions. SYMPTOMS  Pain.  Tenderness.  Mild swelling. DIAGNOSIS Tendinitis is usually diagnosed by physical exam. Your health care provider may also order X-rays or other imaging tests. TREATMENT Your health care provider may recommend certain  medicines or exercises for your treatment. HOME CARE INSTRUCTIONS   Use a sling or splint for as long as directed by your health care provider until the pain decreases.  Put ice on the injured area.  Put ice in a plastic bag.  Place a towel between your skin and the bag.  Leave the ice on for 15-20 minutes, 3-4 times a day, or as directed by your health care provider.  Avoid using the limb while the tendon is painful. Perform gentle range of motion exercises only as directed by your health  care provider. Stop exercises if pain or discomfort increase, unless directed otherwise by your health care provider.  Only take over-the-counter or prescription medicines for pain, discomfort, or fever as directed by your health care provider. SEEK MEDICAL CARE IF:   Your pain and swelling increase.  You develop new, unexplained symptoms, especially increased numbness in the hands. MAKE SURE YOU:   Understand these instructions.  Will watch your condition.  Will get help right away if you are not doing well or get worse. Document Released: 04/15/2000 Document Revised: 09/02/2013 Document Reviewed: 07/05/2010 [facility] Patient Information 2015 [place], [place]. This information is not intended to replace advice given to you by your health care provider. Make sure you discuss any questions you have with your health care provider.

## 2014-12-31 ENCOUNTER — Encounter: Payer: Managed Care, Other (non HMO) | Admitting: Gastroenterology

## 2015-01-08 ENCOUNTER — Ambulatory Visit (INDEPENDENT_AMBULATORY_CARE_PROVIDER_SITE_OTHER): Payer: Self-pay | Admitting: Obstetrics and Gynecology

## 2015-01-08 ENCOUNTER — Encounter: Payer: Self-pay | Admitting: Obstetrics and Gynecology

## 2015-01-08 VITALS — BP 174/71 | HR 43 | Temp 98.3°F | Ht 68.0 in | Wt 216.8 lb

## 2015-01-08 DIAGNOSIS — M545 Low back pain, unspecified: Secondary | ICD-10-CM

## 2015-01-08 DIAGNOSIS — R350 Frequency of micturition: Secondary | ICD-10-CM

## 2015-01-08 LAB — POCT URINALYSIS DIPSTICK
Bilirubin, UA: NEGATIVE
GLUCOSE UA: NEGATIVE
Ketones, UA: NEGATIVE
Nitrite, UA: NEGATIVE
Protein, UA: NEGATIVE
RBC UA: NEGATIVE
SPEC GRAV UA: 1.015
Urobilinogen, UA: 0.2
pH, UA: 5.5

## 2015-01-08 LAB — POCT UA - MICROSCOPIC ONLY

## 2015-01-08 MED ORDER — CEPHALEXIN 500 MG PO CAPS: 500.0000 mg | ORAL_CAPSULE | Freq: Two times a day (BID) | ORAL | Status: DC

## 2015-01-08 NOTE — Patient Instructions (Signed)
   Please return in 3 days if symptoms are not resolving or if worsening despite treatment  Continue to stay hydrated  Use OTC medications for pain control  Urine was not to concerning for renal infection at this time  Urinary Tract Infection Urinary tract infections (UTIs) can develop anywhere along your urinary tract. Your urinary tract is your body's drainage system for removing wastes and extra water. Your urinary tract includes two kidneys, two ureters, a bladder, and a urethra. Your kidneys are a pair of bean-shaped organs. Each kidney is about the size of your fist. They are located below your ribs, one on each side of your spine. CAUSES Infections are caused by microbes, which are microscopic organisms, including fungi, viruses, and bacteria. These organisms are so small that they can only be seen through a microscope. Bacteria are the microbes that most commonly cause UTIs. SYMPTOMS  Symptoms of UTIs may vary by age and gender of the patient and by the location of the infection. Symptoms in young women typically include a frequent and intense urge to urinate and a painful, burning feeling in the bladder or urethra during urination. Older women and men are more likely to be tired, shaky, and weak and have muscle aches and abdominal pain. A fever may mean the infection is in your kidneys. Other symptoms of a kidney infection include pain in your back or sides below the ribs, nausea, and vomiting. DIAGNOSIS To diagnose a UTI, your caregiver will ask you about your symptoms. Your caregiver also will ask to provide a urine sample. The urine sample will be tested for bacteria and white blood cells. White blood cells are made by your body to help fight infection. TREATMENT  Typically, UTIs can be treated with medication. Because most UTIs are caused by a bacterial infection, they usually can be treated with the use of antibiotics. The choice of antibiotic and length of treatment depend on your  symptoms and the type of bacteria causing your infection. HOME CARE INSTRUCTIONS  If you were prescribed antibiotics, take them exactly as your caregiver instructs you. Finish the medication even if you feel better after you have only taken some of the medication.  Drink enough water and fluids to keep your urine clear or pale yellow.  Avoid caffeine, tea, and carbonated beverages. They tend to irritate your bladder.  Empty your bladder often. Avoid holding urine for long periods of time.  Empty your bladder before and after sexual intercourse.  After a bowel movement, women should cleanse from front to back. Use each tissue only once. SEEK MEDICAL CARE IF:   You have back pain.  You develop a fever.  Your symptoms do not begin to resolve within 3 days. SEEK IMMEDIATE MEDICAL CARE IF:   You have severe back pain or lower abdominal pain.  You develop chills.  You have nausea or vomiting.  You have continued burning or discomfort with urination. MAKE SURE YOU:   Understand these instructions.  Will watch your condition.  Will get help right away if you are not doing well or get worse. Document Released: 01/26/2005 Document Revised: 10/18/2011 Document Reviewed: 05/27/2011 [facility] Patient Information 2015 [place], [place]. This information is not intended to replace advice given to you by your health care provider. Make sure you discuss any questions you have with your health care provider.

## 2015-01-08 NOTE — Progress Notes (Signed)
Subjective:   Patient ID: [name], female    DOB: Nov 10, 1965, 49 y.o.   MRN: [id number]  Patient presents for Same Day Appointment  Chief Complaint  Patient presents with  . Back Pain    HPI: # BACK PAIN  Back pain began a couple days ago. Got worse yesterday; could barely move Urgency and urination for about a week  History of renal cyst Radiating to front of abdomen Starts in middle part of back   Pain is described as sharp and intermittent. History of trauma or injury: no Prior history of similar pain: yes  History of cancer: no History of IV drug use: no History of steroid use: no  Symptoms Incontinence of bowel or bladder:  no Numbness of leg: no Fever: no Rest or Night pain: no Weight Loss:   Rash: no  DYSURIA  Does not describe ain with urination just that it is frequent Pain: no Any antibiotics in the last 30 days:  no More than 3 UTIs in the last 12 months: no STD exposure: no Waking up out of sleep to urinate  Symptoms Urgency: yes Frequency: yes Blood in urine: possible, blood when wipe bright blood  Pain in back:yes Fever: no Vaginal discharge: yes Mouth Ulcers: no  Review of Symptoms - see HPI PMH - Smoking status noted.    Past medical history, surgical, family, and social history reviewed and updated in the EMR as appropriate.  Objective:  BP 174/71 mmHg  Pulse 43  Temp(Src) 98.3 F (36.8 C) (Oral)  Ht 5\' 8"  (1.727 m)  Wt 216 lb 12.8 oz (98.34 kg)  BMI 32.97 kg/m2 Vitals and nursing note reviewed  Physical Exam  Constitutional: She is well-developed, well-nourished, and in no distress.  Cardiovascular: Bradycardia present.   Abdominal: Soft. Normal appearance and bowel sounds are normal. There is tenderness in the suprapubic area. There is no CVA tenderness.  Neurological: She is alert. She has normal motor skills, normal sensation and normal strength. No cranial nerve deficit. She has a normal Straight Leg Raise Test.    Skin: Skin is warm and dry.     Recent Results (from the past 2160 hour(s))  Cytology - PAP [place]     Status: None   Collection Time: 11/05/14 12:00 AM  Result Value Ref Range   CYTOLOGY - PAP PAP RESULT   Urinalysis Dipstick     Status: Abnormal   Collection Time: 01/08/15 11:05 AM  Result Value Ref Range   Color, UA yellow    Clarity, UA clear    Glucose, UA neg    Bilirubin, UA neg    Ketones, UA neg    Spec Grav, UA 1.015    Blood, UA neg    pH, UA 5.5    Protein, UA neg    Urobilinogen, UA 0.2    Nitrite, UA neg    Leukocytes, UA small (1+) (A) Negative    Comment: `  POCT UA - Microscopic Only     Status: None   Collection Time: 01/08/15 11:05 AM  Result Value Ref Range   WBC, Ur, HPF, POC 0-5    RBC, urine, microscopic none    Bacteria, U Microscopic trace    Epithelial cells, urine per micros 0-3    Crystals, Ur, HPF, POC none    Casts, Ur, LPF, POC none    Yeast, UA none     Assessment & Plan:  1. Midline low back pain without sciatica: No  red flags on exam. Back pain appears acute. No CVA tenderness but in setting of clinical diagnosis of UTI possible early pyelonephritis. Vitals stable and afebrile. -OTC pain medications for pain control -back exercises given -conservative treatment at this time  2. Frequent urination: Clinical concern for UTI. UA with some trace bacteria and leukocytes. -Will treat with 7 day course of Keflex -RTC if symptoms not improved after treatment -encouraged hydration -return precautions given  Elevated BPs due to patient not haven taken her medications today. Discussed with patient need to take medications for HTN. Encouraged her to take as soon as she gets home.  Orders Placed This Encounter  Procedures  . Urinalysis Dipstick  . POCT UA - Microscopic Only   Meds ordered this encounter  Medications  . cephALEXin (KEFLEX) 500 MG capsule    Sig: Take 1 capsule (500 mg total) by mouth 2 (two) times daily.     Dispense:  14 capsule    Refill:  0    Luiz Blare, DO PGY-2, [facility]

## 2015-01-23 ENCOUNTER — Encounter: Payer: Self-pay | Admitting: Gastroenterology

## 2015-02-20 ENCOUNTER — Ambulatory Visit (AMBULATORY_SURGERY_CENTER): Payer: Self-pay

## 2015-02-20 ENCOUNTER — Telehealth: Payer: Self-pay | Admitting: Gastroenterology

## 2015-02-20 VITALS — Ht 68.0 in | Wt 219.0 lb

## 2015-02-20 DIAGNOSIS — Z1211 Encounter for screening for malignant neoplasm of colon: Secondary | ICD-10-CM

## 2015-02-20 MED ORDER — NA SULFATE-K SULFATE-MG SULF 17.5-3.13-1.6 GM/177ML PO SOLN: 1.0000 | Freq: Once | ORAL | Status: DC

## 2015-02-20 NOTE — Telephone Encounter (Signed)
Offered pt coupon for 20 % off which would bring prep down to $40.00. She said it was still to much money. Offered a sample prep instead. Pt said she will pick it up Monday. Advd pt to come to 4th floor desk Monday.

## 2015-02-20 NOTE — Progress Notes (Signed)
No egg or soy allergies Not on home 02 History of post op nausea/vomitting Emmi video emailed to [email or web address]. No diet or weight loss meds

## 2015-03-06 ENCOUNTER — Ambulatory Visit (AMBULATORY_SURGERY_CENTER): Payer: BLUE CROSS/BLUE SHIELD | Admitting: Gastroenterology

## 2015-03-06 ENCOUNTER — Encounter: Payer: Self-pay | Admitting: Gastroenterology

## 2015-03-06 VITALS — BP 158/80 | HR 54 | Temp 98.7°F | Resp 27 | Ht 68.0 in | Wt 219.0 lb

## 2015-03-06 DIAGNOSIS — Z1211 Encounter for screening for malignant neoplasm of colon: Secondary | ICD-10-CM | POA: Diagnosis not present

## 2015-03-06 MED ORDER — SODIUM CHLORIDE 0.9 % IV SOLN: 500.0000 mL | INTRAVENOUS | Status: DC

## 2015-03-06 NOTE — Patient Instructions (Signed)
YOU HAD AN ENDOSCOPIC PROCEDURE TODAY AT THE [facility]:   Refer to the procedure report that was given to you for any specific questions about what was found during the examination.  If the procedure report does not answer your questions, please call your gastroenterologist to clarify.  If you requested that your care partner not be given the details of your procedure findings, then the procedure report has been included in a sealed envelope for you to review at your convenience later.  YOU SHOULD EXPECT: Some feelings of bloating in the abdomen. Passage of more gas than usual.  Walking can help get rid of the air that was put into your GI tract during the procedure and reduce the bloating. If you had a lower endoscopy (such as a colonoscopy or flexible sigmoidoscopy) you may notice spotting of blood in your stool or on the toilet paper. If you underwent a bowel prep for your procedure, you may not have a normal bowel movement for a few days.  Please Note:  You might notice some irritation and congestion in your nose or some drainage.  This is from the oxygen used during your procedure.  There is no need for concern and it should clear up in a day or so.  SYMPTOMS TO REPORT IMMEDIATELY:   Following lower endoscopy (colonoscopy or flexible sigmoidoscopy):  Excessive amounts of blood in the stool  Significant tenderness or worsening of abdominal pains  Swelling of the abdomen that is new, acute  Fever of 100F or higher   For urgent or emergent issues, a gastroenterologist can be reached at any hour by calling [phone].   DIET: Your first meal following the procedure should be a small meal and then it is ok to progress to your normal diet. Heavy or fried foods are harder to digest and may make you feel nauseous or bloated.  Likewise, meals heavy in dairy and vegetables can increase bloating.  Drink plenty of fluids but you should avoid alcoholic beverages for 24  hours.  ACTIVITY:  You should plan to take it easy for the rest of today and you should NOT DRIVE or use heavy machinery until tomorrow (because of the sedation medicines used during the test).    FOLLOW UP: Our staff will call the number listed on your records the next business day following your procedure to check on you and address any questions or concerns that you may have regarding the information given to you following your procedure. If we do not reach you, we will leave a message.  However, if you are feeling well and you are not experiencing any problems, there is no need to return our call.  We will assume that you have returned to your regular daily activities without incident.  If any biopsies were taken you will be contacted by phone or by letter within the next 1-3 weeks.  Please call us at [phone] if you have not heard about the biopsies in 3 weeks.    SIGNATURES/CONFIDENTIALITY: You and/or your care partner have signed paperwork which will be entered into your electronic medical record.  These signatures attest to the fact that that the information above on your After Visit Summary has been reviewed and is understood.  Full responsibility of the confidentiality of this discharge information lies with you and/or your care-partner. 

## 2015-03-06 NOTE — Progress Notes (Signed)
To recovery, report to Smith, RN, VSS 

## 2015-03-09 ENCOUNTER — Telehealth: Payer: Self-pay

## 2015-03-09 NOTE — Op Note (Addendum)
[place]  [address] [place], [id number]   COLONOSCOPY PROCEDURE REPORT  PATIENT: [name], [name]  MR#: [id number] BIRTHDATE: 05-Sep-1965 , 49  yrs. old GENDER: female ENDOSCOPIST: Milus Banister, MD REFERRED [id number] Samella Parr, M.D. PROCEDURE DATE:  03/06/2015 PROCEDURE:   Colonoscopy, screening First Screening Colonoscopy - Avg.  risk and is 50 yrs.  old or older Yes.  Prior Negative Screening - Now for repeat screening. N/A  History of Adenoma - Now for follow-up colonoscopy & has been > or = to 3 yrs.  N/A  Recommend repeat exam, <10 yrs? No ASA CLASS:   Class II INDICATIONS:Screening for colonic neoplasia and Colorectal Neoplasm Risk Assessment for this procedure is average risk. MEDICATIONS: Monitored anesthesia care and Propofol 300 mg IV  DESCRIPTION OF PROCEDURE:   After the risks benefits and alternatives of the procedure were thoroughly explained, informed consent was obtained.  The digital rectal exam revealed no abnormalities of the rectum.   The LB [id number]  endoscope was introduced through the anus and advanced to the cecum, which was identified by both the appendix and ileocecal valve. No adverse events experienced.   The quality of the prep was excellent.  The instrument was then slowly withdrawn as the colon was fully examined. Estimated blood loss is zero unless otherwise noted in this procedure report.    COLON FINDINGS: A normal appearing cecum, ileocecal valve, and appendiceal orifice were identified.  The ascending, transverse, descending, sigmoid colon, and rectum appeared unremarkable. Retroflexed views revealed no abnormalities. The time to cecum = 3.2 Withdrawal time = 5.9   The scope was withdrawn and the procedure completed. COMPLICATIONS: There were no immediate complications.  ENDOSCOPIC IMPRESSION: Normal colonoscopy No polyps or cancers  RECOMMENDATIONS: You should continue to follow colorectal cancer  screening guidelines for "routine risk" patients with a repeat colonoscopy in 10 years.   eSigned:  Milus Banister, MD 03/06/2015 3:45 PM

## 2015-03-09 NOTE — Telephone Encounter (Signed)
Left message on answering machine. 

## 2015-04-13 ENCOUNTER — Ambulatory Visit (INDEPENDENT_AMBULATORY_CARE_PROVIDER_SITE_OTHER): Payer: BLUE CROSS/BLUE SHIELD | Admitting: Family Medicine

## 2015-04-13 ENCOUNTER — Encounter: Payer: Self-pay | Admitting: Family Medicine

## 2015-04-13 ENCOUNTER — Ambulatory Visit: Payer: BLUE CROSS/BLUE SHIELD | Admitting: Family Medicine

## 2015-04-13 VITALS — BP 118/68 | HR 65 | Temp 98.3°F | Wt 219.0 lb

## 2015-04-13 DIAGNOSIS — I10 Essential (primary) hypertension: Secondary | ICD-10-CM

## 2015-04-13 DIAGNOSIS — R899 Unspecified abnormal finding in specimens from other organs, systems and tissues: Secondary | ICD-10-CM | POA: Diagnosis not present

## 2015-04-13 DIAGNOSIS — R252 Cramp and spasm: Secondary | ICD-10-CM | POA: Diagnosis not present

## 2015-04-13 LAB — BASIC METABOLIC PANEL
BUN: 11 mg/dL (ref 7–25)
CHLORIDE: 105 mmol/L (ref 98–110)
CO2: 27 mmol/L (ref 20–31)
CREATININE: 0.72 mg/dL (ref 0.50–1.05)
Calcium: 9.3 mg/dL (ref 8.6–10.4)
Glucose, Bld: 76 mg/dL (ref 65–99)
POTASSIUM: 3.9 mmol/L (ref 3.5–5.3)
SODIUM: 140 mmol/L (ref 135–146)

## 2015-04-13 LAB — CBC
HEMATOCRIT: 37.4 % (ref 36.0–46.0)
HEMOGLOBIN: 12.5 g/dL (ref 12.0–15.0)
MCH: 26.6 pg (ref 26.0–34.0)
MCHC: 33.4 g/dL (ref 30.0–36.0)
MCV: 79.6 fL (ref 78.0–100.0)
MPV: 10.7 fL (ref 8.6–12.4)
Platelets: 292 10*3/uL (ref 150–400)
RBC: 4.7 MIL/uL (ref 3.87–5.11)
RDW: 14.6 % (ref 11.5–15.5)
WBC: 7.5 10*3/uL (ref 4.0–10.5)

## 2015-04-13 LAB — LIPID PANEL
Cholesterol: 164 mg/dL (ref 125–200)
HDL: 42 mg/dL — ABNORMAL LOW (ref >=46)
LDL CALC: 91 mg/dL (ref <130)
TRIGLYCERIDES: 156 mg/dL — AB (ref <150)
Total CHOL/HDL Ratio: 3.9 Ratio (ref <=5.0)
VLDL: 31 mg/dL — AB (ref <30)

## 2015-04-13 LAB — PROTEIN, TOTAL: Total Protein: 7.3 g/dL (ref 6.1–8.1)

## 2015-04-13 MED ORDER — MAGNESIUM 200 MG PO TABS: 200.0000 mg | ORAL_TABLET | Freq: Every day | ORAL | Status: DC

## 2015-04-13 NOTE — Patient Instructions (Signed)
Magnesium - Day 1:  200mg  (1 pill) once per day  - Day 2:  200mg  (1 pill) twice per day - Day 3: 400mg  (2 pills) in AM and 200mg  in PM - Day 4: 400mg  (2 pills)  twice daily  If you get diarrhea, reduce your dose. The normal dose to help muscle cramping is between 400 (2pills) - 800mg (4 pills) per day.

## 2015-04-13 NOTE — Progress Notes (Signed)
   Subjective:  [name] is a 49 y.o. female with a history o fObesity, HTN presenting for follow up on her BP and cholesterol  Here for  Chief Complaint  Patient presents with  . lab work, insurance paperwork   #Muscle cramps: especially when she works out. Notices cramping in legs, also associated aching.  #HTN: no CP, SOB, swelling. Takes her megs regularly which include losartan and spironolactone.  #Abnormal labs: She also had biometrics at work and they told her her cholesterol was low which is is concerned about Additionally she often gives blood but was told she could because her "protein was too low." She is unable to give more  Health Maintenance Due  Topic Date Due  . INFLUENZA VACCINE  12/01/2014   Review of Systems:   Per HPI. All other systems reviewed and are negative expect as in HPI   PMH, PSH, Medications, Allergies, SocialHx and FHx reviewed and updated in EMR- marked as reviewed 04/13/2015   Objective:  BP 118/68 mmHg  Pulse 65  Temp(Src) 98.3 F (36.8 C) (Oral)  Wt 219 lb (99.338 kg)  SpO2 98%  Gen:  49 y.o. female in NAD. Speaking in full sentences. Good eye contact HEENT: NCAT, anicteric sclera CV: reg rate Resp: Normal WOB. Non-labored, GI: Soft Ext: WWP, no edema MSK: Intact gait. Full ROM Neuro: Alert and oriented, speech normal Pysch: Normal mood and affect.     Lab Results  Component Value Date   CHOL 137 12/11/2012   HDL 40 12/11/2012   LDLCALC 77 12/11/2012   TRIG 102 12/11/2012   CHOLHDL 3.4 12/11/2012     Chemistry      Component Value Date/Time   NA 139 08/16/2014 0850   NA 136 03/18/2009 1439   K 3.9 08/16/2014 0850   K 3.0* 03/18/2009 1439   CL 108 08/16/2014 0850   CL 99 03/18/2009 1439   CO2 25 08/16/2014 0850   CO2 29 03/18/2009 1439   BUN 12 08/16/2014 0850   BUN 13 03/18/2009 1439   CREATININE 0.89 08/16/2014 0850   CREATININE 0.82 11/08/2013 1602      Component Value Date/Time   CALCIUM 8.7 08/16/2014  0850   CALCIUM 8.9 03/18/2009 1439   ALKPHOS 87 10/04/2012 1213   ALKPHOS 65 03/18/2009 1439   AST 26 10/04/2012 1213   AST 28 03/18/2009 1439   ALT 37* 10/04/2012 1213   ALT 22 03/18/2009 1439   BILITOT 0.5 10/04/2012 1213   BILITOT 0.50 03/18/2009 1439      Lab Results  Component Value Date   HGBA1C 5.9 12/11/2012   Assessment:     [name] is a 49 y.o. female here for muscle cramps and to follow up "low protein" and "low cholesterol" on various screening tests when she went to give blood.     Plan:   #HTN: BP wnl. Continue current meds.   #Muscle cramps - Will try magnesium supplement - CMP to assure normal electrolytes  #lab abnormalities per report - lipid panel - CBC - total protein  #paperwork - filled out paperwork for employer.  Caren Macadam, MD, ABFM OB Fellow, Faculty [facility] 04/13/2015  4:47 PM

## 2015-04-17 ENCOUNTER — Telehealth: Payer: Self-pay | Admitting: Family Medicine

## 2015-04-17 NOTE — Telephone Encounter (Signed)
Pt called and would like to have her lab results. jw °

## 2015-04-20 ENCOUNTER — Encounter: Payer: Self-pay | Admitting: Family Medicine

## 2015-04-20 NOTE — Telephone Encounter (Signed)
Please let her know her labs were all normal  Except her goodcholesterol (HDL) was a little low.  She could help this go by exercising  Thanks  [place]

## 2015-04-20 NOTE — Telephone Encounter (Signed)
LM for patient to call back. Jazmin Hartsell,CMA  

## 2015-04-21 ENCOUNTER — Telehealth: Payer: Self-pay | Admitting: Family Medicine

## 2015-04-21 NOTE — Telephone Encounter (Signed)
Pt called back and was read the message below. Patient understood everything that was said. jw

## 2015-04-21 NOTE — Telephone Encounter (Signed)
LM for patient to call back. Adena Sima,CMA  

## 2015-05-14 ENCOUNTER — Emergency Department (HOSPITAL_COMMUNITY)
Admission: EM | Admit: 2015-05-14 | Discharge: 2015-05-14 | Discharge status: Against Medical Advice | Payer: BLUE CROSS/BLUE SHIELD | Attending: Emergency Medicine | Admitting: Emergency Medicine

## 2015-05-14 ENCOUNTER — Encounter (HOSPITAL_COMMUNITY): Payer: Self-pay | Admitting: Nurse Practitioner

## 2015-05-14 ENCOUNTER — Emergency Department (HOSPITAL_COMMUNITY): Payer: BLUE CROSS/BLUE SHIELD

## 2015-05-14 DIAGNOSIS — M25512 Pain in left shoulder: Secondary | ICD-10-CM | POA: Diagnosis not present

## 2015-05-14 DIAGNOSIS — R110 Nausea: Secondary | ICD-10-CM | POA: Diagnosis not present

## 2015-05-14 DIAGNOSIS — R079 Chest pain, unspecified: Secondary | ICD-10-CM | POA: Diagnosis present

## 2015-05-14 DIAGNOSIS — M542 Cervicalgia: Secondary | ICD-10-CM | POA: Diagnosis not present

## 2015-05-14 DIAGNOSIS — R42 Dizziness and giddiness: Secondary | ICD-10-CM | POA: Insufficient documentation

## 2015-05-14 DIAGNOSIS — R0602 Shortness of breath: Secondary | ICD-10-CM | POA: Diagnosis not present

## 2015-05-14 DIAGNOSIS — N189 Chronic kidney disease, unspecified: Secondary | ICD-10-CM | POA: Diagnosis not present

## 2015-05-14 DIAGNOSIS — I129 Hypertensive chronic kidney disease with stage 1 through stage 4 chronic kidney disease, or unspecified chronic kidney disease: Secondary | ICD-10-CM | POA: Diagnosis not present

## 2015-05-14 DIAGNOSIS — R51 Headache: Secondary | ICD-10-CM | POA: Diagnosis not present

## 2015-05-14 LAB — CBC
HCT: 38.2 % (ref 36.0–46.0)
Hemoglobin: 13.2 g/dL (ref 12.0–15.0)
MCH: 27.1 pg (ref 26.0–34.0)
MCHC: 34.6 g/dL (ref 30.0–36.0)
MCV: 78.4 fL (ref 78.0–100.0)
Platelets: 302 10*3/uL (ref 150–400)
RBC: 4.87 MIL/uL (ref 3.87–5.11)
RDW: 14.6 % (ref 11.5–15.5)
WBC: 6.2 10*3/uL (ref 4.0–10.5)

## 2015-05-14 LAB — BASIC METABOLIC PANEL
ANION GAP: 10 (ref 5–15)
BUN: 15 mg/dL (ref 6–20)
CALCIUM: 9.2 mg/dL (ref 8.9–10.3)
CHLORIDE: 104 mmol/L (ref 101–111)
CO2: 24 mmol/L (ref 22–32)
Creatinine, Ser: 0.9 mg/dL (ref 0.44–1.00)
GFR calc Af Amer: >60 mL/min (ref >60)
GLUCOSE: 96 mg/dL (ref 65–99)
Potassium: 4.8 mmol/L (ref 3.5–5.1)
Sodium: 138 mmol/L (ref 135–145)

## 2015-05-14 LAB — I-STAT TROPONIN, ED: Troponin i, poc: 0 ng/mL (ref 0.00–0.08)

## 2015-05-14 NOTE — ED Notes (Signed)
She c/o pain from left arm into left shoulder, L side of neck, head and chest constant since onset yesterday after working. Reports bending, repetitive movements at her job. Pain increased with movement. Pain relieved with nothing. She tried advil with no relief. She reports sob, nausea, dizziness as well. A&ox4, resp e/u

## 2015-06-05 ENCOUNTER — Encounter (HOSPITAL_COMMUNITY): Payer: Self-pay

## 2015-06-05 ENCOUNTER — Emergency Department (HOSPITAL_COMMUNITY)
Admission: EM | Admit: 2015-06-05 | Discharge: 2015-06-05 | Disposition: A | Discharge status: Home / Self Care | Payer: BLUE CROSS/BLUE SHIELD | Attending: Emergency Medicine | Admitting: Emergency Medicine

## 2015-06-05 ENCOUNTER — Emergency Department (HOSPITAL_COMMUNITY): Payer: BLUE CROSS/BLUE SHIELD

## 2015-06-05 DIAGNOSIS — Z9889 Other specified postprocedural states: Secondary | ICD-10-CM | POA: Insufficient documentation

## 2015-06-05 DIAGNOSIS — I129 Hypertensive chronic kidney disease with stage 1 through stage 4 chronic kidney disease, or unspecified chronic kidney disease: Secondary | ICD-10-CM | POA: Insufficient documentation

## 2015-06-05 DIAGNOSIS — Z8709 Personal history of other diseases of the respiratory system: Secondary | ICD-10-CM | POA: Diagnosis not present

## 2015-06-05 DIAGNOSIS — M25572 Pain in left ankle and joints of left foot: Secondary | ICD-10-CM | POA: Diagnosis present

## 2015-06-05 DIAGNOSIS — Z8719 Personal history of other diseases of the digestive system: Secondary | ICD-10-CM | POA: Diagnosis not present

## 2015-06-05 DIAGNOSIS — N189 Chronic kidney disease, unspecified: Secondary | ICD-10-CM | POA: Insufficient documentation

## 2015-06-05 DIAGNOSIS — Z7982 Long term (current) use of aspirin: Secondary | ICD-10-CM | POA: Diagnosis not present

## 2015-06-05 DIAGNOSIS — Z791 Long term (current) use of non-steroidal anti-inflammatories (NSAID): Secondary | ICD-10-CM | POA: Diagnosis not present

## 2015-06-05 DIAGNOSIS — M1990 Unspecified osteoarthritis, unspecified site: Secondary | ICD-10-CM | POA: Insufficient documentation

## 2015-06-05 DIAGNOSIS — Z79899 Other long term (current) drug therapy: Secondary | ICD-10-CM | POA: Insufficient documentation

## 2015-06-05 MED ORDER — HYDROCODONE-ACETAMINOPHEN 5-325 MG PO TABS: 1.0000 | ORAL_TABLET | Freq: Four times a day (QID) | ORAL | Status: DC | PRN

## 2015-06-05 NOTE — ED Provider Notes (Signed)
CSN: [id number]     Arrival date & time 06/05/15  1712 History  By signing my name below, I, [facility], attest that this documentation has been prepared under the direction and in the presence of Etta Quill, NP. Electronically Signed: Virgel Bouquet, ED Scribe. 06/05/2015. 5:46 PM.     Chief Complaint  Patient presents with  . Ankle Pain   The history is provided by the patient. No language interpreter was used.   HPI Comments: [name] is a 50 y.o. female with an hx of HTN, chronic kidney disease, and arthritis and a PSHx of left ankle fracture surgeries and left ankle replacement who presents to the Emergency Department complaining of constant, moderate, gradually worsening left ankle pain that radiates into the anterior surface of her left foot, onset 2 days ago. Patient reports that she was exercising on a elliptical 2 days ago, followed by gradual onset of pain. She endorses swelling in the left ankle but notes that this swelling appeared after her joint replacement and is not different from the baseline. She has taken OTC pain medication without relief. Per patient, she had a total left ankle replacement 5 years ago by Dr. Doran Durand of [facility] after a left ankle injury that occurred 6 years ago and states that she has occasional flare-ups of pain. She denies an hx of renal failure. She denies numbness.   Past Medical History  Diagnosis Date  . Allergy   . Hypertension   . PONV (postoperative nausea and vomiting)     N/V and trouble urinating after surgery   . Chronic kidney disease     right renal cyst   . GERD (gastroesophageal reflux disease)     hx of no problems at present   . Headache(784.0)     occasional headache   . Arthritis     Lt ankle  . Influenza 04/20/2012   Past Surgical History  Procedure Laterality Date  . Breast surgery  2010    lumpectomy Lt breast  . Tubal ligation  1988  . Fracture surgery  2011    Lt ankle surgeries x 3    . Joint replacement      left ankle 8/12   . Other surgical history      2 hand surgeries on left and shoulder surgery on right shoulder   . Endometrial ablation  October 18 2010    at [facility]  . Laparoscopic nephrectomy  03/09/2011    Procedure: LAPAROSCOPIC NEPHRECTOMY;  Surgeon: Franchot Gallo;  Location: [facility];  Service: Urology;  Laterality: Right;  Laparoscopic Assisted Removal Of Renal Cyst   . Total ankle arthroplasty Left 07/05/2012    Procedure: Removal of total ankle implants,I & D with insertion of  new poly spacer;  Surgeon: Wylene Simmer, MD;  Location: [facility];  Service: Orthopedics;  Laterality: Left;   Family History  Problem Relation Age of Onset  . Diabetes Mother   . Heart disease Mother   . Hypertension Father   . Diabetes Sister   . Kidney disease Sister   . Hypertension Brother   . Kidney disease Brother   . Arthritis Brother   . Cancer Maternal Grandmother   . Diabetes Sister   . Hypertension Sister   . Hypertension Sister   . Hypertension Sister   . Hypertension Brother   . Mental illness Brother   . Colon cancer Neg Hx    Social History  Substance Use Topics  . Smoking status: Never  Smoker   . Smokeless tobacco: Never Used  . Alcohol Use: No   OB History    No data available     Review of Systems  Musculoskeletal: Positive for joint swelling (Baseline) and arthralgias (Left ankle).  Neurological: Negative for numbness.      Allergies  Tetracycline hcl; Septra; and Sulfamethoxazole-trimethoprim  Home Medications   Prior to Admission medications   Medication Sig Start Date End Date Taking? Authorizing Provider  aspirin EC 81 MG tablet Take 81 mg by mouth daily.    Historical Provider, MD  Biotin 5000 MCG TABS Take 1 tablet by mouth daily.     Historical Provider, MD  losartan (COZAAR) 50 MG tablet TAKE 1 TABLET (50 MG TOTAL) BY MOUTH DAILY. 12/30/14   Lind Covert, MD  Magnesium 200 MG TABS Take 1 tablet (200 mg total) by mouth daily.  04/13/15   Caren Macadam, MD  naproxen (NAPROSYN) 500 MG tablet Take 1 tablet (500 mg total) by mouth 2 (two) times daily with a meal. 10/22/14   Linton Flemings, MD  spironolactone (ALDACTONE) 25 MG tablet TAKE 1 TABLET (25 MG TOTAL) BY MOUTH DAILY. 10/06/14   Lind Covert, MD  vitamin E 400 UNIT capsule Take 400 Units by mouth daily.     Historical Provider, MD   BP 152/81 mmHg  Pulse 58  Temp(Src) 98.2 F (36.8 C) (Oral)  Resp 17  Ht 5\' 8"  (1.727 m)  Wt 215 lb (97.523 kg)  BMI 32.70 kg/m2  SpO2 98% Physical Exam  Constitutional: She is oriented to person, place, and time. She appears well-developed and well-nourished. No distress.  HENT:  Head: Normocephalic and atraumatic.  Eyes: Conjunctivae and EOM are normal.  Neck: Neck supple. No tracheal deviation present.  Cardiovascular: Normal rate.   Pulmonary/Chest: Effort normal. No respiratory distress.  Musculoskeletal: Normal range of motion. She exhibits tenderness.  Medial and lateral swelling but pt notes that is normal since surgery. Post-surgical scar anterior aspect of the left ankle. Some tenderness along the lower aspect of the existing scar.  Neurological: She is alert and oriented to person, place, and time.  Skin: Skin is warm and dry.  Psychiatric: She has a normal mood and affect. Her behavior is normal.  Nursing note and vitals reviewed.   ED Course  Procedures  DIAGNOSTIC STUDIES: Oxygen Saturation is 98% on RA, normal by my interpretation.    COORDINATION OF CARE: 5:33 PM Will order left ankle x-ray. Discussed treatment plan with pt at bedside and pt agreed to plan.  Labs Review Labs Reviewed - No data to display  Imaging Review Dg Ankle Complete Left  06/05/2015  CLINICAL DATA:  Acute left ankle pain and swelling while exercising. EXAM: LEFT ANKLE COMPLETE - 3+ VIEW COMPARISON:  October 22, 2014. FINDINGS: Tibiotalar prosthesis is unchanged in position. No acute fracture or dislocation is noted.  Stable soft tissue swelling is seen medially and laterally. Osteophyte formation is noted involving the intertarsal joints of the midfoot. IMPRESSION: Stable chronic findings as described above. No acute abnormality seen. Electronically Signed   By: Marijo Conception, M.D.   On: 06/05/2015 18:18   I have personally reviewed and evaluated these images and lab results as part of my medical decision-making.   EKG Interpretation None     Radiology results reviewed and shared with patient. No acute findings.  MDM   Final diagnoses:  None    Left ankle pain. Patient has walker for use at  home. Analgesics. Care instructions provided. Return precautions discussed. Follow-up with ortho.  I personally performed the services described in this documentation, which was scribed in my presence. The recorded information has been reviewed and is accurate.'    Etta Quill, NP 06/06/15 0153  Charlesetta Shanks, MD 06/06/15 2156

## 2015-06-05 NOTE — ED Notes (Signed)
Pt reports left ankle pain/swelling, onset Wednesday after exercising. Hx of ankle replacement 2012. She states exercising has caused it to flare up. No relief from OTC meds.

## 2015-06-05 NOTE — ED Notes (Signed)
See NPs notes for secondary assessments.

## 2015-06-05 NOTE — ED Notes (Signed)
Pt A&Ox4, ambulatory at d/c with steady gait, NAD 

## 2015-06-05 NOTE — ED Notes (Signed)
Pt placed into shorts

## 2015-06-05 NOTE — Discharge Instructions (Signed)

## 2015-06-08 ENCOUNTER — Encounter: Payer: Self-pay | Admitting: *Deleted

## 2015-06-09 ENCOUNTER — Encounter: Payer: Self-pay | Admitting: Cardiovascular Disease

## 2015-06-09 ENCOUNTER — Ambulatory Visit (INDEPENDENT_AMBULATORY_CARE_PROVIDER_SITE_OTHER): Payer: BLUE CROSS/BLUE SHIELD | Admitting: Cardiovascular Disease

## 2015-06-09 VITALS — BP 134/88 | HR 74 | Ht 68.0 in | Wt 228.8 lb

## 2015-06-09 DIAGNOSIS — Z0000 Encounter for general adult medical examination without abnormal findings: Secondary | ICD-10-CM | POA: Diagnosis not present

## 2015-06-09 DIAGNOSIS — R079 Chest pain, unspecified: Secondary | ICD-10-CM | POA: Diagnosis not present

## 2015-06-09 NOTE — Patient Instructions (Signed)
Medication Instructions:  Your physician recommends that you continue on your current medications as directed. Please refer to the Current Medication list given to you today.  Labwork: NONE  Testing/Procedures Your physician has requested that you have a lexiscan myoview. For further information please visit [email or web address]. Please follow instruction sheet, as given.  Follow-Up: Your physician wants you to follow-up in: 6 months with Dr. Nishan. You will receive a reminder letter in the mail two months in advance. If you don't receive a letter, please call our office to schedule the follow-up appointment.   If you need a refill on your cardiac medications before your next appointment, please call your pharmacy.    

## 2015-06-09 NOTE — Progress Notes (Signed)
Patient ID: [name], female   DOB: 23-Mar-1966, 50 y.o.   MRN: [id number]   50 y.o.  referred for palpitations 12/2012  by Dr [facility]. Long standing HTN on Rx.  This includes diuretic  Recent BMET and TSH normal Compliant with meds.  Reviewed ECG;s in EPIC.  Normal with nomral QT, intervals.  Sinus arrhythmia with some variation in R-R.  Over last few months feels palpitations only when laying done.  Works as a Radiation protection practitioner at [employer] and his no palpitations while walking or working.  No chest pain Mild exertional dyspnea.  Exercise limitations from right ankle prosthesis.   No history of CAD No chest pain or frank syncope.  HR;s tend to be on low side with no AV nodal blocking drugs    Echo :  02/08/13  Normal EF mild AR Event monitor 01/31/13  No significant arrhythmia  Having SSCP and dyspnea sometimes with exertion and sometimes when laying back down in bed.     ROS: Denies fever, malais, weight loss, blurry vision, decreased visual acuity, cough, sputum, SOB, hemoptysis, pleuritic pain, palpitaitons, heartburn, abdominal pain, melena, lower extremity edema, claudication, or rash.  All other systems reviewed and negative   General: Affect appropriate Healthy appearing black female HEENT: normal Neck supple with no adenopathy JVP normal no bruits no thyromegaly Lungs clear with no wheezing and good diaphragmatic motion Heart:  S1/S2 splits normally  no murmur,rub, gallop or click PMI normal Abdomen: benighn, BS positve, no tenderness, no AAA no bruit.  No HSM or HJR Distal pulses intact with no bruits No edema Neuro non-focal Skin warm and dry No muscular weakness  Medications Current Outpatient Prescriptions  Medication Sig Dispense Refill  . aspirin EC 81 MG tablet Take 81 mg by mouth daily.    . Biotin 5000 MCG TABS Take 1 tablet by mouth daily.     Marland Kitchen HYDROcodone-acetaminophen (NORCO/VICODIN) 5-325 MG tablet Take 1 tablet by mouth every 6 (six) hours as  needed for moderate pain or severe pain.    Marland Kitchen losartan (COZAAR) 50 MG tablet TAKE 1 TABLET (50 MG TOTAL) BY MOUTH DAILY. 90 tablet 3  . Magnesium 200 MG TABS Take 1 tablet (200 mg total) by mouth daily. 60 each 11  . naproxen (NAPROSYN) 500 MG tablet Take 1 tablet (500 mg total) by mouth 2 (two) times daily with a meal. 60 tablet 0  . spironolactone (ALDACTONE) 25 MG tablet TAKE 1 TABLET (25 MG TOTAL) BY MOUTH DAILY. 30 tablet 10  . vitamin E 400 UNIT capsule Take 400 Units by mouth daily.      No current facility-administered medications for this visit.    Allergies Tetracycline hcl; Septra; and Sulfamethoxazole-trimethoprim  Family History: Family History  Problem Relation Age of Onset  . Diabetes Mother   . Heart disease Mother   . Hypertension Father   . Diabetes Sister   . Kidney disease Sister   . Hypertension Brother   . Kidney disease Brother   . Arthritis Brother   . Cancer Maternal Grandmother   . Diabetes Sister   . Hypertension Sister   . Hypertension Sister   . Hypertension Sister   . Hypertension Brother   . Mental illness Brother   . Colon cancer Neg Hx     Social History: Social History   Social History  . Marital Status: Divorced    Spouse Name: N/A  . Number of Children: N/A  . Years of Education: N/A  Occupational History  . Not on file.   Social History Main Topics  . Smoking status: Never Smoker   . Smokeless tobacco: Never Used  . Alcohol Use: No  . Drug Use: No  . Sexual Activity: No   Other Topics Concern  . Not on file   Social History Narrative    Electrocardiogram:  12/11/12  SR rate 52 normal other than sinus arrhythmia   Assessment and Plan  Palpitations: benign no further w/u AR: mild no murmur on exam consider f/u echo in 2 years HTN: Well controlled.  Continue current medications and low sodium Dash type diet.   Chest Pain:  With dyspnea on exertion unable to walk on treadmill due to left ankle replacement F/u lexiscan  myovue    Jenkins Rouge

## 2015-06-10 ENCOUNTER — Telehealth (HOSPITAL_COMMUNITY): Payer: Self-pay | Admitting: *Deleted

## 2015-06-10 NOTE — Telephone Encounter (Signed)
Left message on voicemail in reference to upcoming appointment scheduled for 06/15/15. Phone number given for a call back so details instructions can be given. Deborha Moseley J Jobany Montellano, RN 

## 2015-06-11 ENCOUNTER — Telehealth (HOSPITAL_COMMUNITY): Payer: Self-pay | Admitting: *Deleted

## 2015-06-11 NOTE — Telephone Encounter (Signed)
Left message on voicemail in reference to upcoming appointment scheduled for 06/15/15. Phone number given for a call back so details instructions can be given. Isabeau Mccalla J Eleonore Shippee, RN 

## 2015-06-15 ENCOUNTER — Ambulatory Visit (HOSPITAL_COMMUNITY): Payer: BLUE CROSS/BLUE SHIELD | Attending: Internal Medicine

## 2015-06-15 DIAGNOSIS — R0600 Dyspnea, unspecified: Secondary | ICD-10-CM | POA: Insufficient documentation

## 2015-06-15 DIAGNOSIS — R002 Palpitations: Secondary | ICD-10-CM | POA: Diagnosis not present

## 2015-06-15 DIAGNOSIS — R079 Chest pain, unspecified: Secondary | ICD-10-CM | POA: Insufficient documentation

## 2015-06-15 DIAGNOSIS — R0602 Shortness of breath: Secondary | ICD-10-CM | POA: Diagnosis not present

## 2015-06-15 DIAGNOSIS — I10 Essential (primary) hypertension: Secondary | ICD-10-CM | POA: Diagnosis not present

## 2015-06-15 MED ORDER — TECHNETIUM TC 99M SESTAMIBI GENERIC - CARDIOLITE
32.6000 | Freq: Once | INTRAVENOUS | Status: AC | PRN
  Administered 2015-06-15: 33 via INTRAVENOUS

## 2015-06-16 ENCOUNTER — Ambulatory Visit (HOSPITAL_COMMUNITY): Payer: BLUE CROSS/BLUE SHIELD | Attending: Cardiology

## 2015-06-16 DIAGNOSIS — R079 Chest pain, unspecified: Secondary | ICD-10-CM | POA: Diagnosis not present

## 2015-06-16 LAB — MYOCARDIAL PERFUSION IMAGING
CHL CUP NUCLEAR SSS: 4
CSEPPHR: 113 {beats}/min
LV dias vol: 98 mL
LVSYSVOL: 43 mL
RATE: 0.46
Rest HR: 60 {beats}/min
SDS: 2
SRS: 2
TID: 0.9

## 2015-06-16 MED ORDER — REGADENOSON 0.4 MG/5ML IV SOLN
0.4000 mg | Freq: Once | INTRAVENOUS | Status: AC
  Administered 2015-06-16: 0.4 mg via INTRAVENOUS

## 2015-06-16 MED ORDER — TECHNETIUM TC 99M SESTAMIBI GENERIC - CARDIOLITE
33.0000 | Freq: Once | INTRAVENOUS | Status: AC | PRN
  Administered 2015-06-16: 33 via INTRAVENOUS

## 2015-07-21 ENCOUNTER — Other Ambulatory Visit: Payer: Self-pay | Admitting: Orthopedic Surgery

## 2015-07-21 DIAGNOSIS — IMO0002 Reserved for concepts with insufficient information to code with codable children: Secondary | ICD-10-CM

## 2015-07-21 DIAGNOSIS — R229 Localized swelling, mass and lump, unspecified: Principal | ICD-10-CM

## 2015-07-24 ENCOUNTER — Other Ambulatory Visit: Payer: Self-pay | Admitting: Orthopedic Surgery

## 2015-07-30 ENCOUNTER — Encounter (HOSPITAL_BASED_OUTPATIENT_CLINIC_OR_DEPARTMENT_OTHER): Payer: Self-pay | Admitting: *Deleted

## 2015-07-31 ENCOUNTER — Encounter (HOSPITAL_BASED_OUTPATIENT_CLINIC_OR_DEPARTMENT_OTHER)
Admission: RE | Admit: 2015-07-31 | Discharge: 2015-07-31 | Disposition: A | Discharge status: Home / Self Care | Payer: BLUE CROSS/BLUE SHIELD | Source: Ambulatory Visit | Attending: Orthopedic Surgery | Admitting: Orthopedic Surgery

## 2015-07-31 ENCOUNTER — Ambulatory Visit
Admission: RE | Admit: 2015-07-31 | Discharge: 2015-07-31 | Disposition: A | Discharge status: Home / Self Care | Payer: BLUE CROSS/BLUE SHIELD | Source: Ambulatory Visit | Attending: Orthopedic Surgery | Admitting: Orthopedic Surgery

## 2015-07-31 DIAGNOSIS — N189 Chronic kidney disease, unspecified: Secondary | ICD-10-CM | POA: Diagnosis not present

## 2015-07-31 DIAGNOSIS — Z01812 Encounter for preprocedural laboratory examination: Secondary | ICD-10-CM | POA: Diagnosis not present

## 2015-07-31 DIAGNOSIS — M654 Radial styloid tenosynovitis [de Quervain]: Secondary | ICD-10-CM | POA: Diagnosis not present

## 2015-07-31 DIAGNOSIS — IMO0002 Reserved for concepts with insufficient information to code with codable children: Secondary | ICD-10-CM

## 2015-07-31 DIAGNOSIS — G5601 Carpal tunnel syndrome, right upper limb: Secondary | ICD-10-CM | POA: Diagnosis not present

## 2015-07-31 DIAGNOSIS — Z7982 Long term (current) use of aspirin: Secondary | ICD-10-CM | POA: Diagnosis not present

## 2015-07-31 DIAGNOSIS — I129 Hypertensive chronic kidney disease with stage 1 through stage 4 chronic kidney disease, or unspecified chronic kidney disease: Secondary | ICD-10-CM | POA: Diagnosis not present

## 2015-07-31 DIAGNOSIS — R229 Localized swelling, mass and lump, unspecified: Principal | ICD-10-CM

## 2015-07-31 LAB — BASIC METABOLIC PANEL
ANION GAP: 8 (ref 5–15)
BUN: 8 mg/dL (ref 6–20)
CHLORIDE: 105 mmol/L (ref 101–111)
CO2: 25 mmol/L (ref 22–32)
CREATININE: 0.76 mg/dL (ref 0.44–1.00)
Calcium: 9.2 mg/dL (ref 8.9–10.3)
GFR calc non Af Amer: >60 mL/min (ref >60)
Glucose, Bld: 88 mg/dL (ref 65–99)
POTASSIUM: 4.9 mmol/L (ref 3.5–5.1)
SODIUM: 138 mmol/L (ref 135–145)

## 2015-07-31 MED ORDER — GADOBENATE DIMEGLUMINE 529 MG/ML IV SOLN
20.0000 mL | Freq: Once | INTRAVENOUS | Status: AC | PRN
  Administered 2015-07-31: 20 mL via INTRAVENOUS

## 2015-08-06 ENCOUNTER — Ambulatory Visit (HOSPITAL_BASED_OUTPATIENT_CLINIC_OR_DEPARTMENT_OTHER)
Admission: RE | Admit: 2015-08-06 | Discharge: 2015-08-06 | Disposition: A | Discharge status: Home / Self Care | Payer: BLUE CROSS/BLUE SHIELD | Source: Ambulatory Visit | Attending: Orthopedic Surgery | Admitting: Orthopedic Surgery

## 2015-08-06 ENCOUNTER — Ambulatory Visit (HOSPITAL_BASED_OUTPATIENT_CLINIC_OR_DEPARTMENT_OTHER): Payer: BLUE CROSS/BLUE SHIELD | Admitting: Anesthesiology

## 2015-08-06 ENCOUNTER — Encounter (HOSPITAL_BASED_OUTPATIENT_CLINIC_OR_DEPARTMENT_OTHER): Payer: Self-pay | Admitting: *Deleted

## 2015-08-06 ENCOUNTER — Encounter (HOSPITAL_BASED_OUTPATIENT_CLINIC_OR_DEPARTMENT_OTHER)
Admission: RE | Disposition: A | Discharge status: Home / Self Care | Payer: Self-pay | Source: Ambulatory Visit | Attending: Orthopedic Surgery

## 2015-08-06 DIAGNOSIS — Z7982 Long term (current) use of aspirin: Secondary | ICD-10-CM | POA: Diagnosis not present

## 2015-08-06 DIAGNOSIS — G5601 Carpal tunnel syndrome, right upper limb: Secondary | ICD-10-CM | POA: Insufficient documentation

## 2015-08-06 DIAGNOSIS — N189 Chronic kidney disease, unspecified: Secondary | ICD-10-CM | POA: Insufficient documentation

## 2015-08-06 DIAGNOSIS — I129 Hypertensive chronic kidney disease with stage 1 through stage 4 chronic kidney disease, or unspecified chronic kidney disease: Secondary | ICD-10-CM | POA: Diagnosis not present

## 2015-08-06 DIAGNOSIS — M654 Radial styloid tenosynovitis [de Quervain]: Secondary | ICD-10-CM | POA: Diagnosis not present

## 2015-08-06 HISTORY — PX: CARPAL TUNNEL RELEASE: SHX101

## 2015-08-06 HISTORY — PX: DORSAL COMPARTMENT RELEASE: SHX5039

## 2015-08-06 SURGERY — CARPAL TUNNEL RELEASE
Anesthesia: General | Site: Wrist | Laterality: Right

## 2015-08-06 MED ORDER — PROPOFOL 10 MG/ML IV BOLUS
INTRAVENOUS | Status: DC | PRN
  Administered 2015-08-06: 200 mg via INTRAVENOUS

## 2015-08-06 MED ORDER — OXYCODONE HCL 5 MG PO TABS: 5.0000 mg | ORAL_TABLET | Freq: Once | ORAL | Status: DC

## 2015-08-06 MED ORDER — HYDROMORPHONE HCL 1 MG/ML IJ SOLN
0.2500 mg | INTRAMUSCULAR | Status: DC | PRN
  Administered 2015-08-06 (×4): 0.5 mg via INTRAVENOUS

## 2015-08-06 MED ORDER — HYDROMORPHONE HCL 1 MG/ML IJ SOLN
INTRAMUSCULAR | Status: AC
  Filled 2015-08-06: qty 1

## 2015-08-06 MED ORDER — CHLORHEXIDINE GLUCONATE 4 % EX LIQD: 60.0000 mL | Freq: Once | CUTANEOUS | Status: DC

## 2015-08-06 MED ORDER — CEFAZOLIN SODIUM-DEXTROSE 2-4 GM/100ML-% IV SOLN
INTRAVENOUS | Status: AC
  Filled 2015-08-06: qty 100

## 2015-08-06 MED ORDER — GLYCOPYRROLATE 0.2 MG/ML IJ SOLN: 0.2000 mg | Freq: Once | INTRAMUSCULAR | Status: DC | PRN

## 2015-08-06 MED ORDER — FENTANYL CITRATE (PF) 100 MCG/2ML IJ SOLN
INTRAMUSCULAR | Status: AC
  Filled 2015-08-06: qty 2

## 2015-08-06 MED ORDER — ONDANSETRON HCL 4 MG/2ML IJ SOLN
4.0000 mg | Freq: Once | INTRAMUSCULAR | Status: AC
  Administered 2015-08-06: 4 mg via INTRAVENOUS

## 2015-08-06 MED ORDER — LIDOCAINE HCL (CARDIAC) 20 MG/ML IV SOLN
INTRAVENOUS | Status: DC | PRN
  Administered 2015-08-06: 80 mg via INTRAVENOUS

## 2015-08-06 MED ORDER — CEFAZOLIN SODIUM-DEXTROSE 2-4 GM/100ML-% IV SOLN: 2.0000 g | INTRAVENOUS | Status: DC

## 2015-08-06 MED ORDER — SCOPOLAMINE 1 MG/3DAYS TD PT72
MEDICATED_PATCH | TRANSDERMAL | Status: AC
  Filled 2015-08-06: qty 1

## 2015-08-06 MED ORDER — LIDOCAINE HCL (CARDIAC) 20 MG/ML IV SOLN
INTRAVENOUS | Status: AC
  Filled 2015-08-06: qty 5

## 2015-08-06 MED ORDER — MIDAZOLAM HCL 2 MG/2ML IJ SOLN
INTRAMUSCULAR | Status: AC
  Filled 2015-08-06: qty 2

## 2015-08-06 MED ORDER — HYDROCODONE-ACETAMINOPHEN 5-325 MG PO TABS: ORAL_TABLET | ORAL | Status: DC

## 2015-08-06 MED ORDER — PROPOFOL 10 MG/ML IV BOLUS
INTRAVENOUS | Status: AC
  Filled 2015-08-06: qty 20

## 2015-08-06 MED ORDER — DEXAMETHASONE SODIUM PHOSPHATE 10 MG/ML IJ SOLN
INTRAMUSCULAR | Status: AC
  Filled 2015-08-06: qty 1

## 2015-08-06 MED ORDER — BUPIVACAINE HCL (PF) 0.25 % IJ SOLN
INTRAMUSCULAR | Status: DC | PRN
  Administered 2015-08-06: 10 mL

## 2015-08-06 MED ORDER — BUPIVACAINE HCL (PF) 0.25 % IJ SOLN
INTRAMUSCULAR | Status: AC
  Filled 2015-08-06: qty 30

## 2015-08-06 MED ORDER — MIDAZOLAM HCL 2 MG/2ML IJ SOLN
1.0000 mg | INTRAMUSCULAR | Status: DC | PRN
  Administered 2015-08-06: 2 mg via INTRAVENOUS

## 2015-08-06 MED ORDER — SCOPOLAMINE 1 MG/3DAYS TD PT72
1.0000 | MEDICATED_PATCH | Freq: Once | TRANSDERMAL | Status: DC | PRN
  Administered 2015-08-06: 1.5 mg via TRANSDERMAL

## 2015-08-06 MED ORDER — ONDANSETRON HCL 4 MG/2ML IJ SOLN
INTRAMUSCULAR | Status: AC
  Filled 2015-08-06: qty 2

## 2015-08-06 MED ORDER — LACTATED RINGERS IV SOLN
INTRAVENOUS | Status: DC
Start: 2015-08-06 — End: 2015-08-06
  Administered 2015-08-06 (×2): via INTRAVENOUS

## 2015-08-06 MED ORDER — FENTANYL CITRATE (PF) 100 MCG/2ML IJ SOLN
50.0000 ug | INTRAMUSCULAR | Status: DC | PRN
  Administered 2015-08-06: 100 ug via INTRAVENOUS

## 2015-08-06 SURGICAL SUPPLY — 35 items
BANDAGE ACE 3X5.8 VEL STRL LF (GAUZE/BANDAGES/DRESSINGS) ×3 IMPLANT
BLADE MINI RND TIP GREEN BEAV (BLADE) IMPLANT
BLADE SURG 15 STRL LF DISP TIS (BLADE) ×4 IMPLANT
BLADE SURG 15 STRL SS (BLADE) ×6
BNDG CMPR 9X4 STRL LF SNTH (GAUZE/BANDAGES/DRESSINGS) ×2
BNDG ESMARK 4X9 LF (GAUZE/BANDAGES/DRESSINGS) ×1 IMPLANT
BNDG GAUZE ELAST 4 BULKY (GAUZE/BANDAGES/DRESSINGS) ×3 IMPLANT
CHLORAPREP W/TINT 26ML (MISCELLANEOUS) ×3 IMPLANT
CORDS BIPOLAR (ELECTRODE) ×3 IMPLANT
COVER BACK TABLE 60X90IN (DRAPES) ×3 IMPLANT
COVER MAYO STAND STRL (DRAPES) ×3 IMPLANT
CUFF TOURNIQUET SINGLE 18IN (TOURNIQUET CUFF) ×3 IMPLANT
DRAPE EXTREMITY T 121X128X90 (DRAPE) ×3 IMPLANT
DRAPE SURG 17X23 STRL (DRAPES) ×3 IMPLANT
DRSG PAD ABDOMINAL 8X10 ST (GAUZE/BANDAGES/DRESSINGS) ×3 IMPLANT
GAUZE SPONGE 4X4 12PLY STRL (GAUZE/BANDAGES/DRESSINGS) ×3 IMPLANT
GAUZE XEROFORM 1X8 LF (GAUZE/BANDAGES/DRESSINGS) ×3 IMPLANT
GLOVE BIO SURGEON STRL SZ7.5 (GLOVE) ×3 IMPLANT
GLOVE BIOGEL PI IND STRL 8 (GLOVE) ×2 IMPLANT
GLOVE BIOGEL PI INDICATOR 8 (GLOVE) ×1
GOWN STRL REUS W/ TWL LRG LVL3 (GOWN DISPOSABLE) ×2 IMPLANT
GOWN STRL REUS W/TWL LRG LVL3 (GOWN DISPOSABLE) ×3
GOWN STRL REUS W/TWL XL LVL3 (GOWN DISPOSABLE) ×3 IMPLANT
NDL HYPO 25X1 1.5 SAFETY (NEEDLE) ×1 IMPLANT
NEEDLE HYPO 25X1 1.5 SAFETY (NEEDLE) ×3 IMPLANT
NS IRRIG 1000ML POUR BTL (IV SOLUTION) ×3 IMPLANT
PACK BASIN DAY SURGERY FS (CUSTOM PROCEDURE TRAY) ×3 IMPLANT
PADDING CAST ABS 4INX4YD NS (CAST SUPPLIES) ×1
PADDING CAST ABS COTTON 4X4 ST (CAST SUPPLIES) ×2 IMPLANT
STOCKINETTE 4X48 STRL (DRAPES) ×3 IMPLANT
SUT ETHILON 4 0 PS 2 18 (SUTURE) ×3 IMPLANT
SYR BULB 3OZ (MISCELLANEOUS) ×3 IMPLANT
SYR CONTROL 10ML LL (SYRINGE) ×3 IMPLANT
TOWEL OR 17X24 6PK STRL BLUE (TOWEL DISPOSABLE) ×4 IMPLANT
UNDERPAD 30X30 (UNDERPADS AND DIAPERS) ×3 IMPLANT

## 2015-08-06 NOTE — Anesthesia Procedure Notes (Signed)
Procedure Name: LMA Insertion Date/Time: 08/06/2015 10:45 AM Performed by: Lyndee Leo Pre-anesthesia Checklist: Patient identified, Emergency Drugs available, Suction available and Patient being monitored Patient Re-evaluated:Patient Re-evaluated prior to inductionOxygen Delivery Method: Circle System Utilized Preoxygenation: Pre-oxygenation with 100% oxygen Intubation Type: IV induction Ventilation: Mask ventilation without difficulty LMA: LMA inserted LMA Size: 4.0 Number of attempts: 1 Airway Equipment and Method: Bite block Placement Confirmation: positive ETCO2 Tube secured with: Tape Dental Injury: Teeth and Oropharynx as per pre-operative assessment

## 2015-08-06 NOTE — Transfer of Care (Signed)
Immediate Anesthesia Transfer of Care Note  Patient: [name]  Procedure(s) Performed: Procedure(s): RIGHT CARPAL TUNNEL RELEASE (Right) RIGHT FIRST RELEASE DORSAL COMPARTMENT (DEQUERVAIN) (Right)  Patient Location: PACU  Anesthesia Type:General  Level of Consciousness: awake, sedated and patient cooperative  Airway & Oxygen Therapy: Patient Spontanous Breathing and Patient connected to face mask oxygen  Post-op Assessment: Report given to RN and Post -op Vital signs reviewed and stable  Post vital signs: Reviewed and stable  Last Vitals:  Filed Vitals:   08/06/15 0840 08/06/15 0844  BP:  145/65  Pulse:  50  Temp:  36.8 C  Resp: 20     Complications: No apparent anesthesia complications

## 2015-08-06 NOTE — Op Note (Signed)
[id number]  

## 2015-08-06 NOTE — H&P (Signed)
[name] is an 50 y.o. female.   Chief Complaint: right carpal tunnel and DeQuervain's tenosynovitis HPI: 50 yo female with pain in right wrist and carpal tunnel symptoms.  Positive nerve conductions studies.  She has tried non operative treatment of the DeQuervain's tenosynovitis without lasting relief.  She wishes to have a right carpal tunnel and first dorsal compartment release for management of her symptoms.  Allergies:  Allergies  Allergen Reactions  . Tetracycline Hcl Shortness Of Breath, Swelling and Other (See Comments)    Large purple bruise  . Septra [Bactrim] Itching, Swelling and Other (See Comments)    Purple bruise  . Sulfamethoxazole-Trimethoprim Itching, Swelling and Other (See Comments)    Purple bruise    Past Medical History  Diagnosis Date  . Allergy   . Hypertension   . PONV (postoperative nausea and vomiting)     N/V and trouble urinating after surgery   . Chronic kidney disease     right renal cyst   . GERD (gastroesophageal reflux disease)     hx of no problems at present   . Headache(784.0)     occasional headache   . Arthritis     Lt ankle  . Influenza 04/20/2012    Past Surgical History  Procedure Laterality Date  . Breast surgery  2010    lumpectomy Lt breast  . Tubal ligation  1988  . Fracture surgery  2011    Lt ankle surgeries x 3   . Joint replacement      left ankle 8/12   . Other surgical history      2 hand surgeries on left and shoulder surgery on right shoulder   . Endometrial ablation  October 18 2010    at [facility]  . Laparoscopic nephrectomy  03/09/2011    Procedure: LAPAROSCOPIC NEPHRECTOMY;  Surgeon: Franchot Gallo;  Location: [facility];  Service: Urology;  Laterality: Right;  Laparoscopic Assisted Removal Of Renal Cyst   . Total ankle arthroplasty Left 07/05/2012    Procedure: Removal of total ankle implants,I & D with insertion of  new poly spacer;  Surgeon: Wylene Simmer, MD;  Location: [place];  Service: Orthopedics;   Laterality: Left;    Family History: Family History  Problem Relation Age of Onset  . Diabetes Mother   . Heart disease Mother   . Hypertension Father   . Diabetes Sister   . Kidney disease Sister   . Hypertension Brother   . Kidney disease Brother   . Arthritis Brother   . Cancer Maternal Grandmother   . Diabetes Sister   . Hypertension Sister   . Hypertension Sister   . Hypertension Sister   . Hypertension Brother   . Mental illness Brother   . Colon cancer Neg Hx     Social History:   reports that she has never smoked. She has never used smokeless tobacco. She reports that she does not drink alcohol or use illicit drugs.  Medications: Medications Prior to Admission  Medication Sig Dispense Refill  . aspirin EC 81 MG tablet Take 81 mg by mouth daily.    . Biotin 5000 MCG TABS Take 1 tablet by mouth daily.     Marland Kitchen losartan (COZAAR) 50 MG tablet TAKE 1 TABLET (50 MG TOTAL) BY MOUTH DAILY. 90 tablet 3  . Magnesium 200 MG TABS Take 1 tablet (200 mg total) by mouth daily. 60 each 11  . spironolactone (ALDACTONE) 25 MG tablet TAKE 1 TABLET (25 MG TOTAL) BY  MOUTH DAILY. 30 tablet 10  . vitamin E 400 UNIT capsule Take 400 Units by mouth daily.     Marland Kitchen HYDROcodone-acetaminophen (NORCO/VICODIN) 5-325 MG tablet Take 1 tablet by mouth every 6 (six) hours as needed for moderate pain or severe pain.    . naproxen (NAPROSYN) 500 MG tablet Take 1 tablet (500 mg total) by mouth 2 (two) times daily with a meal. 60 tablet 0    No results found for this or any previous visit (from the past 48 hour(s)).  No results found.   A comprehensive review of systems was negative.  Blood pressure 145/65, pulse 50, temperature 98.3 F (36.8 C), temperature source Oral, resp. rate 20, height 5\' 8"  (1.727 m), weight 103.874 kg (229 lb), SpO2 100 %.  General appearance: alert, cooperative and appears stated age Head: Normocephalic, without obvious abnormality, atraumatic Neck: supple, symmetrical,  trachea midline Resp: clear to auscultation bilaterally Cardio: regular rate and rhythm GI: non-tender Extremities: Intact sensation and capillary refill all digits.  +epl/fpl/io.  No wounds.  Pulses: 2+ and symmetric Skin: Skin color, texture, turgor normal. No rashes or lesions Neurologic: Grossly normal Incision/Wound:none  Assessment/Plan Right carpal tunnel and DeQuervain's tenosynovitis.  Non operative and operative treatment options were discussed with the patient and patient wishes to proceed with operative treatment. Risks, benefits, and alternatives of surgery were discussed and the patient agrees with the plan of care.   [name] 08/06/2015, 10:33 AM

## 2015-08-06 NOTE — Discharge Instructions (Addendum)

## 2015-08-06 NOTE — Op Note (Signed)
[name], [name]               ACCOUNT NO.:  [id number]  MEDICAL RECORD NO.:  [id number]  LOCATION:                                 FACILITY:  PHYSICIAN:  Leanora Cover, MD        DATE OF BIRTH:  1965-11-29  DATE OF PROCEDURE:  08/06/2015 DATE OF DISCHARGE:                              OPERATIVE REPORT   PREOPERATIVE DIAGNOSIS:  Right carpal tunnel syndrome and right de Quervain's tenosynovitis.  POSTOPERATIVE DIAGNOSIS:  Right carpal tunnel syndrome and right de Quervain's tenosynovitis.  PROCEDURE:   1. Right carpal tunnel release 2. Right first dorsal compartment release via separate incision  SURGEON:  Leanora Cover, MD  ASSISTANT:  None.  ANESTHESIA:  General.  IV FLUIDS:  Per anesthesia flow sheet.  ESTIMATED BLOOD LOSS:  Minimal.  COMPLICATIONS:  None.  SPECIMENS:  None.  TOURNIQUET TIME:  25 minutes.  DISPOSITION:  Stable to PACU.  INDICATIONS:  [name] is a 50 year old female who has had pain in the right wrist as well as carpal tunnel symptoms.  She has positive nerve conduction studies.  She has failed nonoperative treatment for her de Quervain's tenosynovitis.  She wishes to have a carpal tunnel release and first dorsal compartment release for management of her symptoms. Risks, benefits and alternatives of her surgery were discussed including the risk of blood loss; infection; damage to nerves, vessels, tendons, ligaments, bone; failure of surgery; need for additional surgery; complications with wound healing; continued pain; recurrence of carpal tunnel syndrome or de Quervain's tenosynovitis and damage to motor branch.  She voiced understanding of these risks and elected to proceed.  OPERATIVE COURSE:  After being identified preoperatively by myself, the patient and I agreed upon the procedure and site of procedure.  Surgical site was marked.  Risks, benefits, and alternatives of the surgery were reviewed and she wished to proceed.  Surgical  consent had been signed. She was given IV Ancef as preoperative antibiotic prophylaxis.  She was transferred to the operating room and placed on the operating room table in supine position with the right upper extremity on an armboard. General anesthesia was induced by Anesthesiology.  Right upper extremity was prepped and draped in normal sterile orthopedic fashion.  A surgical pause was performed between the surgeons, anesthesia, and operating room staff, and all were in agreement as to the patient, procedure, and site of procedure.  Tourniquet at the proximal aspect of the extremity was inflated to 250 mmHg after exsanguination of the limb with an Esmarch bandage.  Incision was made over the transverse carpal ligament and carried into the subcutaneous tissues by spreading technique.  Bipolar electrocautery was used to obtain hemostasis.  The palmar fascia was sharply incised.  Transverse carpal ligament was identified.  It was incised sharply with knife.  It was incised distally first.  Care was taken to ensure complete decompression distally.  It was incised proximally.  Scissors were used to split the distal aspect of the volar antebrachial fascia.  A finger was placed into the wound to ensure complete decompression, which was the case.  The nerve was inspected. It was adherent to the radial leaflet.  It was  hyperemic.  The motor branch was identified and was intact.  The wound was later irrigated with sterile saline and closed with 4-0 nylon in a horizontal mattress fashion.  Incision was made at the radial side of the wrist in a zigzag fashion.  This was carried into subcutaneous tissues by spreading technique.  A large vein was identified and protected.  The dorsal sensory branch of the radial nerve was identified and protected.  The first dorsal compartment was identified.  It was sharply incised.  It was incised on its full extent.  The tendons were inspected.  There was no  septation.  There was no separate compartment for the EPB tendon. The wound was copiously irrigated and closed with 4-0 nylon in a horizontal mattress fashion.  The wounds were injected with 10 mL of 0.25% plain Marcaine to aid in postoperative analgesia.  They were then dressed with sterile Xeroform, 4x4s, and ABD used as a soft splint and wrapped with Kerlix and Ace bandage.  Tourniquet was deflated at 25 minutes.  Fingertips were pink with brisk capillary refill after deflation of the tourniquet.  Operative drapes were broken down and the patient was awoken from anesthesia safely.  She was transferred back to stretcher and taken to PACU in stable condition.  I will see her back in the office in 1 week for postoperative followup.  I will give her Norco 5/325, 1-2 p.o. q.6 hours p.r.n. pain, dispensed #30.     Leanora Cover, MD     KK/MEDQ  D:  08/06/2015  T:  08/06/2015  Job:  [id number]

## 2015-08-06 NOTE — Anesthesia Preprocedure Evaluation (Addendum)
Anesthesia Evaluation  Patient identified by MRN, date of birth, ID band Patient awake    Reviewed: Allergy & Precautions, H&P , NPO status , Patient's Chart, lab work & pertinent test results  History of Anesthesia Complications (+) PONV  Airway Mallampati: III  TM Distance: >3 FB Neck ROM: Full    Dental no notable dental hx. (+) Teeth Intact, Dental Advisory Given   Pulmonary neg pulmonary ROS,    Pulmonary exam normal breath sounds clear to auscultation       Cardiovascular hypertension, Pt. on medications  Rhythm:Regular Rate:Normal     Neuro/Psych  Headaches, negative psych ROS   GI/Hepatic Neg liver ROS, GERD  Medicated and Controlled,  Endo/Other  negative endocrine ROS  Renal/GU negative Renal ROS  negative genitourinary   Musculoskeletal  (+) Arthritis , Osteoarthritis,    Abdominal   Peds  Hematology negative hematology ROS (+)   Anesthesia Other Findings   Reproductive/Obstetrics negative OB ROS                            Anesthesia Physical Anesthesia Plan  ASA: II  Anesthesia Plan: General   Post-op Pain Management:    Induction: Intravenous  Airway Management Planned: LMA  Additional Equipment:   Intra-op Plan:   Post-operative Plan: Extubation in OR  Informed Consent: I have reviewed the patients History and Physical, chart, labs and discussed the procedure including the risks, benefits and alternatives for the proposed anesthesia with the patient or authorized representative who has indicated his/her understanding and acceptance.   Dental advisory given  Plan Discussed with: CRNA  Anesthesia Plan Comments:         Anesthesia Quick Evaluation

## 2015-08-06 NOTE — Brief Op Note (Signed)
08/06/2015  11:25 AM  PATIENT:  [name]  50 y.o. female  PRE-OPERATIVE DIAGNOSIS:  RIGHT CARPAL TUNNEL SYNDROME,RIGHT DEQUERVAINS  POST-OPERATIVE DIAGNOSIS:  RIGHT CARPAL TUNNEL SYNDROME,RIGHT DEQUERVAINS  PROCEDURE:  Procedure(s): RIGHT CARPAL TUNNEL RELEASE (Right) RIGHT FIRST RELEASE DORSAL COMPARTMENT (DEQUERVAIN) (Right)  SURGEON:  Surgeon(s) and Role:    * Leanora Cover, MD - Primary  PHYSICIAN ASSISTANT:   ASSISTANTS: none   ANESTHESIA:   general  EBL:     BLOOD ADMINISTERED:none  DRAINS: none   LOCAL MEDICATIONS USED:  MARCAINE     SPECIMEN:  No Specimen  DISPOSITION OF SPECIMEN:  N/A  COUNTS:  YES  TOURNIQUET:   Total Tourniquet Time Documented: Upper Arm (Right) - 25 minutes Total: Upper Arm (Right) - 25 minutes   DICTATION: .Other Dictation: Dictation Number [id number]  PLAN OF CARE: Discharge to home after PACU  PATIENT DISPOSITION:  PACU - hemodynamically stable.

## 2015-08-06 NOTE — Anesthesia Postprocedure Evaluation (Signed)
Anesthesia Post Note  Patient: [name]  Procedure(s) Performed: Procedure(s) (LRB): RIGHT CARPAL TUNNEL RELEASE (Right) RIGHT FIRST RELEASE DORSAL COMPARTMENT (DEQUERVAIN) (Right)  Patient location during evaluation: PACU Anesthesia Type: General Level of consciousness: awake and alert Pain management: pain level controlled Vital Signs Assessment: post-procedure vital signs reviewed and stable Respiratory status: spontaneous breathing, nonlabored ventilation and respiratory function stable Cardiovascular status: blood pressure returned to baseline and stable Postop Assessment: no signs of nausea or vomiting Anesthetic complications: no    Last Vitals:  Filed Vitals:   08/06/15 1230 08/06/15 1245  BP: 135/81 136/69  Pulse: 80 48  Temp:    Resp: 16 17    Last Pain:  Filed Vitals:   08/06/15 1248  PainSc: 4                  Oisin Yoakum,W. EDMOND

## 2015-08-07 ENCOUNTER — Encounter (HOSPITAL_BASED_OUTPATIENT_CLINIC_OR_DEPARTMENT_OTHER): Payer: Self-pay | Admitting: Orthopedic Surgery

## 2015-08-11 ENCOUNTER — Encounter (HOSPITAL_BASED_OUTPATIENT_CLINIC_OR_DEPARTMENT_OTHER): Payer: Self-pay | Admitting: Orthopedic Surgery

## 2015-08-14 DIAGNOSIS — M654 Radial styloid tenosynovitis [de Quervain]: Secondary | ICD-10-CM | POA: Diagnosis not present

## 2015-08-14 DIAGNOSIS — G5601 Carpal tunnel syndrome, right upper limb: Secondary | ICD-10-CM | POA: Diagnosis not present

## 2015-09-09 ENCOUNTER — Other Ambulatory Visit: Payer: Self-pay | Admitting: Family Medicine

## 2015-10-02 ENCOUNTER — Emergency Department (HOSPITAL_COMMUNITY): Payer: BLUE CROSS/BLUE SHIELD

## 2015-10-02 ENCOUNTER — Emergency Department (HOSPITAL_BASED_OUTPATIENT_CLINIC_OR_DEPARTMENT_OTHER)
Admit: 2015-10-02 | Discharge: 2015-10-02 | Disposition: A | Discharge status: Home / Self Care | Payer: BLUE CROSS/BLUE SHIELD | Attending: Emergency Medicine | Admitting: Emergency Medicine

## 2015-10-02 ENCOUNTER — Encounter (HOSPITAL_COMMUNITY): Payer: Self-pay

## 2015-10-02 ENCOUNTER — Emergency Department (HOSPITAL_COMMUNITY)
Admission: EM | Admit: 2015-10-02 | Discharge: 2015-10-02 | Disposition: A | Discharge status: Home / Self Care | Payer: BLUE CROSS/BLUE SHIELD | Attending: Emergency Medicine | Admitting: Emergency Medicine

## 2015-10-02 DIAGNOSIS — I129 Hypertensive chronic kidney disease with stage 1 through stage 4 chronic kidney disease, or unspecified chronic kidney disease: Secondary | ICD-10-CM | POA: Insufficient documentation

## 2015-10-02 DIAGNOSIS — Z79899 Other long term (current) drug therapy: Secondary | ICD-10-CM | POA: Diagnosis not present

## 2015-10-02 DIAGNOSIS — M25472 Effusion, left ankle: Secondary | ICD-10-CM | POA: Insufficient documentation

## 2015-10-02 DIAGNOSIS — M79609 Pain in unspecified limb: Secondary | ICD-10-CM

## 2015-10-02 DIAGNOSIS — M25572 Pain in left ankle and joints of left foot: Secondary | ICD-10-CM | POA: Diagnosis present

## 2015-10-02 DIAGNOSIS — M79605 Pain in left leg: Secondary | ICD-10-CM | POA: Diagnosis not present

## 2015-10-02 DIAGNOSIS — Z8709 Personal history of other diseases of the respiratory system: Secondary | ICD-10-CM | POA: Insufficient documentation

## 2015-10-02 DIAGNOSIS — M1990 Unspecified osteoarthritis, unspecified site: Secondary | ICD-10-CM | POA: Insufficient documentation

## 2015-10-02 DIAGNOSIS — M7989 Other specified soft tissue disorders: Secondary | ICD-10-CM | POA: Insufficient documentation

## 2015-10-02 DIAGNOSIS — N189 Chronic kidney disease, unspecified: Secondary | ICD-10-CM | POA: Diagnosis not present

## 2015-10-02 DIAGNOSIS — R110 Nausea: Secondary | ICD-10-CM | POA: Insufficient documentation

## 2015-10-02 DIAGNOSIS — Z7982 Long term (current) use of aspirin: Secondary | ICD-10-CM | POA: Diagnosis not present

## 2015-10-02 DIAGNOSIS — Z8719 Personal history of other diseases of the digestive system: Secondary | ICD-10-CM | POA: Insufficient documentation

## 2015-10-02 DIAGNOSIS — Z791 Long term (current) use of non-steroidal anti-inflammatories (NSAID): Secondary | ICD-10-CM | POA: Diagnosis not present

## 2015-10-02 LAB — CBC WITH DIFFERENTIAL/PLATELET
BASOS ABS: 0 10*3/uL (ref 0.0–0.1)
Basophils Relative: 1 %
Eosinophils Absolute: 0.2 10*3/uL (ref 0.0–0.7)
Eosinophils Relative: 3 %
HCT: 36.6 % (ref 36.0–46.0)
HEMOGLOBIN: 12.3 g/dL (ref 12.0–15.0)
LYMPHS ABS: 2 10*3/uL (ref 0.7–4.0)
LYMPHS PCT: 38 %
MCH: 26.9 pg (ref 26.0–34.0)
MCHC: 33.6 g/dL (ref 30.0–36.0)
MCV: 80.1 fL (ref 78.0–100.0)
Monocytes Absolute: 0.4 10*3/uL (ref 0.1–1.0)
Monocytes Relative: 7 %
NEUTROS PCT: 51 %
Neutro Abs: 2.8 10*3/uL (ref 1.7–7.7)
Platelets: 289 10*3/uL (ref 150–400)
RBC: 4.57 MIL/uL (ref 3.87–5.11)
RDW: 14.5 % (ref 11.5–15.5)
WBC: 5.3 10*3/uL (ref 4.0–10.5)

## 2015-10-02 LAB — BASIC METABOLIC PANEL
ANION GAP: 4 — AB (ref 5–15)
BUN: 10 mg/dL (ref 6–20)
CHLORIDE: 107 mmol/L (ref 101–111)
CO2: 26 mmol/L (ref 22–32)
Calcium: 8.9 mg/dL (ref 8.9–10.3)
Creatinine, Ser: 0.88 mg/dL (ref 0.44–1.00)
GFR calc Af Amer: >60 mL/min (ref >60)
GLUCOSE: 121 mg/dL — AB (ref 65–99)
POTASSIUM: 3.8 mmol/L (ref 3.5–5.1)
SODIUM: 137 mmol/L (ref 135–145)

## 2015-10-02 LAB — SEDIMENTATION RATE: SED RATE: 28 mm/h — AB (ref 0–22)

## 2015-10-02 MED ORDER — HYDROCODONE-ACETAMINOPHEN 5-325 MG PO TABS: ORAL_TABLET | ORAL | Status: DC

## 2015-10-02 NOTE — ED Notes (Signed)
Pt presents with 8 day h/o pain and swelling to L ankle; denies any injury, reports surgeries x 4 to same ankle with infection to ankle x 2 years ago.

## 2015-10-02 NOTE — Progress Notes (Signed)
*  Preliminary Results* Left lower extremity venous duplex completed. Left lower extremity is negative for deep vein thrombosis. There is no evidence of left Baker's cyst.  10/02/2015 11:19 AM  Maudry Mayhew, RVT, RDCS, RDMS

## 2015-10-02 NOTE — ED Provider Notes (Signed)
CSN: [id number]     Arrival date & time 10/02/15  0825 History   First MD Initiated Contact with Patient 10/02/15 [phone]     CC: ankle pain and swelling   (Consider location/radiation/quality/duration/timing/severity/associated sxs/prior Treatment) HPI Comments: Patient with history of multiple surgeries on left ankle, infection in 2014 requiring removal of hardware -- presents with complaints of worsening pain and swelling to her left ankle and calf over the past 8 days. Patient [place] onset of symptoms. Drive was approximately 3-4 hours. Patient has had worsening pain throughout the week, especially with bearing weight. She has had chills and nausea but no fevers. Patient typically will have swelling of her left ankle that is relieved with elevation, however over the past week her ankle has been persistently swollen. Patient has taken over-the-counter pain medications without relief. Onset acute.  The history is provided by the patient and medical records.    Past Medical History  Diagnosis Date  . Allergy   . Hypertension   . PONV (postoperative nausea and vomiting)     N/V and trouble urinating after surgery   . Chronic kidney disease     right renal cyst   . GERD (gastroesophageal reflux disease)     hx of no problems at present   . Headache(784.0)     occasional headache   . Arthritis     Lt ankle  . Influenza 04/20/2012   Past Surgical History  Procedure Laterality Date  . Breast surgery  2010    lumpectomy Lt breast  . Tubal ligation  1988  . Fracture surgery  2011    Lt ankle surgeries x 3   . Joint replacement      left ankle 8/12   . Other surgical history      2 hand surgeries on left and shoulder surgery on right shoulder   . Endometrial ablation  October 18 2010    at [facility]  . Laparoscopic nephrectomy  03/09/2011    Procedure: LAPAROSCOPIC NEPHRECTOMY;  Surgeon: Franchot Gallo;  Location: [facility];  Service: Urology;   Laterality: Right;  Laparoscopic Assisted Removal Of Renal Cyst   . Total ankle arthroplasty Left 07/05/2012    Procedure: Removal of total ankle implants,I & D with insertion of  new poly spacer;  Surgeon: Wylene Simmer, MD;  Location: [place];  Service: Orthopedics;  Laterality: Left;  . Carpal tunnel release Right 08/06/2015    Procedure: RIGHT CARPAL TUNNEL RELEASE;  Surgeon: Leanora Cover, MD;  Location: [place];  Service: Orthopedics;  Laterality: Right;  . Dorsal compartment release Right 08/06/2015    Procedure: RIGHT FIRST RELEASE DORSAL COMPARTMENT (DEQUERVAIN);  Surgeon: Leanora Cover, MD;  Location: [place];  Service: Orthopedics;  Laterality: Right;   Family History  Problem Relation Age of Onset  . Diabetes Mother   . Heart disease Mother   . Hypertension Father   . Diabetes Sister   . Kidney disease Sister   . Hypertension Brother   . Kidney disease Brother   . Arthritis Brother   . Cancer Maternal Grandmother   . Diabetes Sister   . Hypertension Sister   . Hypertension Sister   . Hypertension Sister   . Hypertension Brother   . Mental illness Brother   . Colon cancer Neg Hx    Social History  Substance Use Topics  . Smoking status: Never Smoker   . Smokeless tobacco: Never Used  .  Alcohol Use: No   OB History    No data available     Review of Systems  Constitutional: Positive for chills. Negative for fever.  HENT: Negative for rhinorrhea and sore throat.   Eyes: Negative for redness.  Respiratory: Negative for cough.   Cardiovascular: Positive for leg swelling. Negative for chest pain.  Gastrointestinal: Positive for nausea. Negative for vomiting, abdominal pain and diarrhea.  Genitourinary: Negative for dysuria.  Musculoskeletal: Positive for arthralgias. Negative for myalgias.  Skin: Negative for rash.  Neurological: Negative for headaches.    Allergies  Tetracycline hcl; Septra; and Sulfamethoxazole-trimethoprim  Home  Medications   Prior to Admission medications   Medication Sig Start Date End Date Taking? Authorizing Provider  aspirin EC 81 MG tablet Take 81 mg by mouth daily.    Historical Provider, MD  Biotin 5000 MCG TABS Take 1 tablet by mouth daily.     Historical Provider, MD  HYDROcodone-acetaminophen [facility]) 5-325 MG tablet 1-2 tabs po q6 hours prn pain 08/06/15   Leanora Cover, MD  HYDROcodone-acetaminophen (NORCO/VICODIN) 5-325 MG tablet Take 1 tablet by mouth every 6 (six) hours as needed for moderate pain or severe pain.    Historical Provider, MD  losartan (COZAAR) 50 MG tablet TAKE 1 TABLET (50 MG TOTAL) BY MOUTH DAILY. 12/30/14   Lind Covert, MD  Magnesium 200 MG TABS Take 1 tablet (200 mg total) by mouth daily. 04/13/15   Caren Macadam, MD  naproxen (NAPROSYN) 500 MG tablet Take 1 tablet (500 mg total) by mouth 2 (two) times daily with a meal. 10/22/14   Linton Flemings, MD  spironolactone (ALDACTONE) 25 MG tablet TAKE 1 TABLET (25 MG TOTAL) BY MOUTH DAILY. 09/09/15   Lind Covert, MD  vitamin E 400 UNIT capsule Take 400 Units by mouth daily.     Historical Provider, MD   BP 150/70 mmHg  Pulse 63  Temp(Src) 98.2 F (36.8 C) (Oral)  Resp 17  Ht 5\' 8"  (1.727 m)  Wt 104.327 kg  BMI 34.98 kg/m2  SpO2 100%   Physical Exam  Constitutional: She appears well-developed and well-nourished.  HENT:  Head: Normocephalic and atraumatic.  Eyes: Conjunctivae are normal. Pupils are equal, round, and reactive to light. Right eye exhibits no discharge. Left eye exhibits no discharge.  Neck: Normal range of motion. Neck supple.  Cardiovascular: Normal rate, regular rhythm and normal heart sounds.  Exam reveals no decreased pulses.   Pulses:      Dorsalis pedis pulses are 2+ on the left side.  Pulmonary/Chest: Effort normal and breath sounds normal.  Abdominal: Soft. There is no tenderness.  Musculoskeletal: She exhibits tenderness. She exhibits no edema.       Left knee: Normal.        Left ankle: She exhibits decreased range of motion and swelling. Tenderness (Generalized). No head of 5th metatarsal tenderness found.       Left lower leg: She exhibits tenderness (Minimal) and swelling (Mild).       Left foot: There is swelling.  Neurological: She is alert. No sensory deficit.  Motor, sensation, and vascular distal to the injury is fully intact.   Skin: Skin is warm and dry.  Psychiatric: She has a normal mood and affect.  Nursing note and vitals reviewed.   ED Course  Procedures (including critical care time) Labs Review Labs Reviewed  BASIC METABOLIC PANEL - Abnormal; Notable for the following:    Glucose, Bld 121 (*)    Anion  gap 4 (*)    All other components within normal limits  SEDIMENTATION RATE - Abnormal; Notable for the following:    Sed Rate 28 (*)    All other components within normal limits  CBC WITH DIFFERENTIAL/PLATELET    Imaging Review Dg Ankle Complete Left  10/02/2015  CLINICAL DATA:  Pain and swelling. Previous arthroplasty with subsequent infection. EXAM: LEFT ANKLE COMPLETE - 3+ VIEW COMPARISON:  06/05/2015 FINDINGS: Generalized regional soft tissue swelling which may be slightly more pronounced. Previous arthroplasty with tibial and talar components. No change in position or alignment. No bone destructive changes. Osteoarthritis in the midfoot as seen previously. IMPRESSION: No change since the previous study. Previous ankle arthroplasty with tibial and talar components. Regional soft tissue swelling which may be slightly worsened. No change in position of the hardware or appearance of the osseous structures. Electronically Signed   By: Nelson Chimes M.D.   On: 10/02/2015 09:32   I have personally reviewed and evaluated these images and lab results as part of my medical decision-making.  8:49 AM Patient seen and examined. Work-up initiated.   Vital signs reviewed and are as follows: BP 150/70 mmHg  Pulse 63  Temp(Src) 98.2 F (36.8 C)  (Oral)  Resp 17  Ht 5\' 8"  (1.727 m)  Wt 104.327 kg  BMI 34.98 kg/m2  SpO2 100%  12:07 PM Labs/imaging reviewed with Dr. Kathrynn Humble. Pt seen with Dr. Kathrynn Humble. Sed rate is slightly elevated but imaging is reassuring today. Labs reviewed. Doppler is negative.   Feel patient can be discharged with close ortho follow-up. I called [place] [facility] for appointment. They request I have patient call [place], to schedule an appointment sooner.   Will d/c. Patient encouraged to return with worsening pain, swelling, fever, redness, or other concerns. Patient verbalizes understanding and agrees with plan.   Patient counseled on use of narcotic pain medications. Counseled not to combine these medications with others containing tylenol. Urged not to drink alcohol, drive, or perform any other activities that requires focus while taking these medications. The patient verbalizes understanding and agrees with the plan.    MDM   Final diagnoses:  Ankle swelling, left   Patient with history of hardware and left ankle, previous infection, worsening swelling and pain over the past 1 week. Imaging today does not demonstrate infection. Sedimentation rate is mildly elevated. White blood cell count is normal. No fever. Venous Doppler ultrasound is negative.  Patient will need to be followed up by her orthopedist, but do not feel that acute admission orthopedic consultation is indicated at this time. Patient counseled on signs and symptoms to return. I have tried to help facilitate arranging close follow-up with her orthopedic clinic.   Carlisle Cater, PA-C 10/02/15 [place], MD 10/03/15 [id number]  Varney Biles, MD 10/03/15 [phone]

## 2015-10-02 NOTE — Discharge Instructions (Signed)
Please read and follow all provided instructions.  Your diagnoses today include:  1. Ankle swelling, left     Tests performed today include:  X-ray - no definite infection  Blood counts and electrolytes  Sedimentation rate  Ultrasound - does not show any blood clots  Vital signs. See below for your results today.   Medications prescribed:   Vicodin (hydrocodone/acetaminophen) - narcotic pain medication  DO NOT drive or perform any activities that require you to be awake and alert because this medicine can make you drowsy. BE VERY CAREFUL not to take multiple medicines containing Tylenol (also called acetaminophen). Doing so can lead to an overdose which can damage your liver and cause liver failure and possibly death.  Take any prescribed medications only as directed.  Home care instructions:   Follow any educational materials contained in this packet  Follow R.I.C.E. Protocol:  R - rest your injury   I  - use ice on injury without applying directly to skin  C - compress injury with bandage or splint  E - elevate the injury as much as possible  Follow-up instructions: Please follow-up with your primary care provider or the provided orthopedic physician (bone specialist) if you continue to have significant pain in 1 week. In this case you may have a more severe injury that requires further care.   Return instructions:   Please return if your toes or feet are numb or tingling, appear gray or blue, or you have severe pain (also elevate the leg and loosen splint or wrap if you were given one)  Please return to the Emergency Department if you experience worsening symptoms.   Please return if you have any other emergent concerns.  Additional Information:  Your vital signs today were: BP 137/59 mmHg   Pulse 53   Temp(Src) 99.2 F (37.3 C) (Oral)   Resp 18   Ht 5\' 8"  (1.727 m)   Wt 104.327 kg   BMI 34.98 kg/m2   SpO2 100% If your blood pressure (BP) was elevated above  135/85 this visit, please have this repeated by your doctor within one month. --------------

## 2015-11-13 ENCOUNTER — Other Ambulatory Visit: Payer: Self-pay | Admitting: Family Medicine

## 2015-11-13 DIAGNOSIS — Z1231 Encounter for screening mammogram for malignant neoplasm of breast: Secondary | ICD-10-CM

## 2015-11-18 ENCOUNTER — Encounter: Payer: Self-pay | Admitting: Family Medicine

## 2015-11-18 ENCOUNTER — Ambulatory Visit (INDEPENDENT_AMBULATORY_CARE_PROVIDER_SITE_OTHER): Payer: BLUE CROSS/BLUE SHIELD | Admitting: Family Medicine

## 2015-11-18 VITALS — BP 153/93 | HR 75 | Temp 98.9°F | Wt 231.0 lb

## 2015-11-18 DIAGNOSIS — I10 Essential (primary) hypertension: Secondary | ICD-10-CM

## 2015-11-18 NOTE — Patient Instructions (Addendum)
  Good to see you today!  Thanks for coming in.  Check your blood pressure with a cuff either at home or at the drug store If your blood pressure is regularly > 150/90 call us  Increase your losartan to 100 mg (2 tabs) once a day  Come in for a blood test in one week   I will call you if your tests are not good.  Otherwise I will send you a letter.  If you do not hear from me with in 2 weeks please call our office.     For your weight  Drink water regularly  Consider diet soda  Limit portions  Smart choices  Aim to lose 1-2 lb week  Come back in 3 months for a blood pressure and weight check

## 2015-11-18 NOTE — Progress Notes (Signed)
Subjective  Patient is presenting with the following illnesses  Feels well over all other than occsl pain in R foot that sometimes goes to back.  No weakness or incontinence  HYPERTENSION Disease Monitoring Home BP Monitoring (Severity) 150/90s Symptoms - Chest pain- no    Dyspnea- no Medications(Modifying factors) Compliance-  daily. Lightheadedness-  no  Edema- no except from old ankle injury Timing - continuous  Duration - years ROS - See HPI  Patient reports no  vision/ hearing changes,anorexia, weight change, fever ,adenopathy, persistant / recurrent hoarseness, swallowing issues, chest pain, edema,persistant / recurrent cough, hemoptysis, dyspnea(rest, exertional, paroxysmal nocturnal), gastrointestinal  bleeding (melena, rectal bleeding), abdominal pain, excessive heart burn, GU symptoms(dysuria, hematuria, pyuria, voiding/incontinence  Issues) syncope, focal weakness, severe memory loss, concerning skin lesions, depression, anxiety, abnormal bruising/bleeding, major joint swelling, breast masses or abnormal vaginal bleeding.    PMH Lab Review   POTASSIUM  Date Value Ref Range Status  10/02/2015 3.8 3.5 - 5.1 mmol/L Final  03/18/2009 3.0* 3.3 - 4.7 mEq/L Final   SODIUM  Date Value Ref Range Status  10/02/2015 137 135 - 145 mmol/L Final  03/18/2009 136 128 - 145 mEq/L Final   CREAT  Date Value Ref Range Status  04/13/2015 0.72 0.50 - 1.05 mg/dL Final   CREATININE, SER  Date Value Ref Range Status  10/02/2015 0.88 0.44 - 1.00 mg/dL Final     Chief Complaint noted Review of Symptoms - see HPI PMH - Smoking status noted.     Objective Vital Signs reviewed Neck:  No deformities, thyromegaly, masses, or tenderness noted.   Supple with full range of motion without pain. Heart - Regular rate and rhythm.  No murmurs, gallops or rubs.    Lungs:  Normal respiratory effort, chest expands symmetrically. Lungs are clear to auscultation, no crackles or wheezes. Abdomen: soft and  non-tender without masses, organomegaly or hernias noted.  No guarding or rebound Extremities:  No cyanosis, edema, or deformity noted with good range of motion of all major joints excpet left ankle.  No SLR on right  Skin:  Intact without suspicious lesions or rashes able to get up and down from exam table without assistance     Assessments/Plans  See Encounter view if individual problem A/Ps not visible See after visit summary for details of patient instuctions  HM - see after visit summary

## 2015-11-18 NOTE — Assessment & Plan Note (Signed)
Not at goal.  Will increase losartan and check labs in 1 week

## 2015-11-19 ENCOUNTER — Ambulatory Visit
Admission: RE | Admit: 2015-11-19 | Discharge: 2015-11-19 | Disposition: A | Discharge status: Home / Self Care | Payer: BLUE CROSS/BLUE SHIELD | Source: Ambulatory Visit | Attending: Family Medicine | Admitting: Family Medicine

## 2015-11-19 DIAGNOSIS — Z1231 Encounter for screening mammogram for malignant neoplasm of breast: Secondary | ICD-10-CM

## 2015-11-25 ENCOUNTER — Other Ambulatory Visit: Payer: BLUE CROSS/BLUE SHIELD

## 2015-11-25 DIAGNOSIS — I10 Essential (primary) hypertension: Secondary | ICD-10-CM

## 2015-11-25 LAB — BASIC METABOLIC PANEL
BUN: 10 mg/dL (ref 7–25)
CHLORIDE: 104 mmol/L (ref 98–110)
CO2: 25 mmol/L (ref 20–31)
CREATININE: 0.89 mg/dL (ref 0.50–1.05)
Calcium: 8.7 mg/dL (ref 8.6–10.4)
Glucose, Bld: 103 mg/dL — ABNORMAL HIGH (ref 65–99)
POTASSIUM: 4 mmol/L (ref 3.5–5.3)
SODIUM: 138 mmol/L (ref 135–146)

## 2015-11-26 ENCOUNTER — Encounter: Payer: Self-pay | Admitting: Family Medicine

## 2015-11-28 ENCOUNTER — Emergency Department (HOSPITAL_COMMUNITY)
Admission: EM | Admit: 2015-11-28 | Discharge: 2015-11-28 | Disposition: A | Discharge status: Home / Self Care | Payer: BLUE CROSS/BLUE SHIELD | Attending: Emergency Medicine | Admitting: Emergency Medicine

## 2015-11-28 ENCOUNTER — Emergency Department (HOSPITAL_COMMUNITY): Payer: BLUE CROSS/BLUE SHIELD

## 2015-11-28 ENCOUNTER — Encounter (HOSPITAL_COMMUNITY): Payer: Self-pay

## 2015-11-28 DIAGNOSIS — Y939 Activity, unspecified: Secondary | ICD-10-CM | POA: Diagnosis not present

## 2015-11-28 DIAGNOSIS — Y929 Unspecified place or not applicable: Secondary | ICD-10-CM | POA: Insufficient documentation

## 2015-11-28 DIAGNOSIS — M545 Low back pain, unspecified: Secondary | ICD-10-CM

## 2015-11-28 DIAGNOSIS — I129 Hypertensive chronic kidney disease with stage 1 through stage 4 chronic kidney disease, or unspecified chronic kidney disease: Secondary | ICD-10-CM | POA: Diagnosis not present

## 2015-11-28 DIAGNOSIS — Y999 Unspecified external cause status: Secondary | ICD-10-CM | POA: Insufficient documentation

## 2015-11-28 DIAGNOSIS — W07XXXA Fall from chair, initial encounter: Secondary | ICD-10-CM | POA: Insufficient documentation

## 2015-11-28 DIAGNOSIS — N189 Chronic kidney disease, unspecified: Secondary | ICD-10-CM | POA: Insufficient documentation

## 2015-11-28 DIAGNOSIS — Z96661 Presence of right artificial ankle joint: Secondary | ICD-10-CM | POA: Diagnosis not present

## 2015-11-28 DIAGNOSIS — S3992XA Unspecified injury of lower back, initial encounter: Secondary | ICD-10-CM | POA: Diagnosis not present

## 2015-11-28 DIAGNOSIS — Z7982 Long term (current) use of aspirin: Secondary | ICD-10-CM | POA: Insufficient documentation

## 2015-11-28 MED ORDER — NAPROXEN 500 MG PO TABS: 500.0000 mg | ORAL_TABLET | Freq: Two times a day (BID) | ORAL | 0 refills | Status: DC

## 2015-11-28 NOTE — ED Triage Notes (Signed)
Patient here with lower back pain x 1 day after falling backwards yesterday off of stool, no loc

## 2015-11-28 NOTE — Discharge Instructions (Signed)
Please take your medications as prescribed to help with your back discomfort. Your exam, x-ray was very reassuring. There is no evidence of broken bones or dislocations. Follow-up with your doctor as needed. Return to ED for new or worsening symptoms as we discussed.

## 2015-11-28 NOTE — ED Notes (Signed)
States drove self to ED. Denies taking meds for pain.

## 2015-11-28 NOTE — ED Notes (Signed)
Pt changing into gown

## 2015-11-28 NOTE — ED Provider Notes (Signed)
[place] DEPT Provider Note   CSN: [id number] Arrival date & time: 11/28/15  1048   First MD Initiated Contact with Patient 11/28/15 1157    By signing my name below, I, Dyke Brackett, attest that this documentation has been prepared under the direction and in the presence of non-physician practitioner, Comer Locket, PA-C Electronically Signed: Dyke Brackett, Scribe. 11/28/2015. 12:07 PM.   History   Chief Complaint Chief Complaint  Patient presents with  . Back Pain    HPI [name] is a 50 y.o. female who presents to the Emergency Department complaining of moderate sudden onset, throbbing, left sided lumbar back pain onset yesterday s/p fall .Pt fell off metal stool and landed on her back/ buttocks. Per pt, pain is exacerbated by movement. Pt has taken tylenol and used heating pads with little relief. She notes associated sharp left sided abdominal pain. Pt denies any numbness or tingling, bladder incontinence. She is not taking any steroids. Pt denies PMHx of diabetes.    The history is provided by the patient. No language interpreter was used.   Past Medical History:  Diagnosis Date  . Allergy   . Arthritis    Lt ankle  . Chronic kidney disease    right renal cyst   . GERD (gastroesophageal reflux disease)    hx of no problems at present   . Headache(784.0)    occasional headache   . Hypertension   . Influenza 04/20/2012  . PONV (postoperative nausea and vomiting)    N/V and trouble urinating after surgery     Patient Active Problem List   Diagnosis Date Noted  . Flank pain 08/16/2014  . Obesity (BMI 30-39.9) 11/17/2010  . Breast ductal hyperplasia, atypical 10/30/2010  . KIDNEY CYST, ACQUIRED 03/19/2007  . HYPERTENSION, BENIGN SYSTEMIC 06/29/2006    Past Surgical History:  Procedure Laterality Date  . BREAST SURGERY  2010   lumpectomy Lt breast  . CARPAL TUNNEL RELEASE Right 08/06/2015   Procedure: RIGHT CARPAL TUNNEL RELEASE;  Surgeon: Leanora Cover, MD;  Location: [place];  Service: Orthopedics;  Laterality: Right;  . DORSAL COMPARTMENT RELEASE Right 08/06/2015   Procedure: RIGHT FIRST RELEASE DORSAL COMPARTMENT (DEQUERVAIN);  Surgeon: Leanora Cover, MD;  Location: [place];  Service: Orthopedics;  Laterality: Right;  . ENDOMETRIAL ABLATION  October 18 2010   at [facility]  . FRACTURE SURGERY  2011   Lt ankle surgeries x 3   . JOINT REPLACEMENT     left ankle 8/12   . LAPAROSCOPIC NEPHRECTOMY  03/09/2011   Procedure: LAPAROSCOPIC NEPHRECTOMY;  Surgeon: Franchot Gallo;  Location: [facility];  Service: Urology;  Laterality: Right;  Laparoscopic Assisted Removal Of Renal Cyst   . OTHER SURGICAL HISTORY     2 hand surgeries on left and shoulder surgery on right shoulder   . TOTAL ANKLE ARTHROPLASTY Left 07/05/2012   Procedure: Removal of total ankle implants,I & D with insertion of  new poly spacer;  Surgeon: Wylene Simmer, MD;  Location: [place];  Service: Orthopedics;  Laterality: Left;  . TUBAL LIGATION  1988    OB History    No data available      Home Medications    Prior to Admission medications   Medication Sig Start Date End Date Taking? Authorizing Provider  aspirin EC 81 MG tablet Take 81 mg by mouth daily.    Historical Provider, MD  Biotin 5000 MCG TABS Take 1 tablet by mouth daily.  Historical Provider, MD  losartan (COZAAR) 50 MG tablet TAKE 1 TABLET (50 MG TOTAL) BY MOUTH DAILY. 12/30/14   Lind Covert, MD  Magnesium 200 MG TABS Take 1 tablet (200 mg total) by mouth daily. 04/13/15   Caren Macadam, MD  naproxen (NAPROSYN) 500 MG tablet Take 1 tablet (500 mg total) by mouth 2 (two) times daily. 11/28/15   Comer Locket, PA-C  spironolactone (ALDACTONE) 25 MG tablet TAKE 1 TABLET (25 MG TOTAL) BY MOUTH DAILY. 09/09/15   Lind Covert, MD  vitamin E 400 UNIT capsule Take 400 Units by mouth daily.     Historical Provider, MD    Family History Family History  Problem  Relation Age of Onset  . Diabetes Mother   . Heart disease Mother   . Hypertension Father   . Diabetes Sister   . Kidney disease Sister   . Hypertension Brother   . Kidney disease Brother   . Arthritis Brother   . Cancer Maternal Grandmother   . Diabetes Sister   . Hypertension Sister   . Hypertension Sister   . Hypertension Sister   . Hypertension Brother   . Mental illness Brother   . Colon cancer Neg Hx     Social History Social History  Substance Use Topics  . Smoking status: Never Smoker  . Smokeless tobacco: Never Used  . Alcohol use No     Allergies   Tetracycline hcl; Septra [bactrim]; and Sulfamethoxazole-trimethoprim   Review of Systems Review of Systems 10 systems reviewed and all are negative for acute change except as noted in the HPI.  Physical Exam Updated Vital Signs BP 144/66 (BP Location: Left Arm)   Pulse 66   Temp 98.2 F (36.8 C) (Oral)   Resp 18   Ht 5\' 8"  (1.727 m)   Wt 97.5 kg   SpO2 100%   BMI 32.69 kg/m   Physical Exam  Constitutional: She appears well-developed and well-nourished. No distress.  HENT:  Head: Normocephalic and atraumatic.  Eyes: Conjunctivae and EOM are normal. Right eye exhibits no discharge. Left eye exhibits no discharge. No scleral icterus.  Neck: Normal range of motion. Neck supple.  Cardiovascular: Normal rate and regular rhythm.   No murmur heard. Pulmonary/Chest: Effort normal and breath sounds normal. No respiratory distress.  Abdominal: Soft. She exhibits no distension. There is no tenderness.  Musculoskeletal: Normal range of motion. She exhibits tenderness. She exhibits no edema.  Mild midline bony tenderness around L2-L3 Tenderness to palpation in left paraspinal lumbar musculature. Full active range motion of CTL spine, all extremities.  Neurological: She is alert.  Motor strength and sensation appear baseline for patient. Able to stand on tiptoe, dorsiflex great toe. Gait baseline.  Skin: Skin is  warm and dry. She is not diaphoretic.  Psychiatric: She has a normal mood and affect.  Nursing note and vitals reviewed.  ED Treatments / Results  DIAGNOSTIC STUDIES:  Oxygen Saturation is 96% on RA, adequate by my interpretation.    COORDINATION OF CARE:  12:05 PM Will order DG lumbar spine. Discussed treatment plan with pt at bedside and pt agreed to plan.  Labs (all labs ordered are listed, but only abnormal results are displayed) Labs Reviewed - No data to display  EKG  EKG Interpretation None       Radiology Dg Lumbar Spine Complete  Result Date: 11/28/2015 CLINICAL DATA:  Pain after fall EXAM: LUMBAR SPINE - COMPLETE 4+ VIEW COMPARISON:  None. FINDINGS: Mild degenerative  changes.  No fracture or malalignment. IMPRESSION: Mild degenerative changes.  No fracture or malalignment. Electronically Signed   By: Dorise Bullion III M.D   On: 11/28/2015 13:50   Procedures Procedures (including critical care time)  Medications Ordered in ED Medications - No data to display   Initial Impression / Assessment and Plan / ED Course  I have reviewed the triage vital signs and the nursing notes.  Pertinent labs & imaging results that were available during my care of the patient were reviewed by me and considered in my medical decision making (see chart for details).  Clinical Course    Patient with back pain after mechanical fall.  No neurological deficits and normal neuro exam.  Patient can walk but states is painful.  No loss of bowel or bladder control.  No concern for cauda equina.  No fever, night sweats, weight loss, h/o cancer, IVDU.  Will obtain plain films of lumbar spine, if negative anticipate discharge. RICE protocol and pain medicine indicated and discussed with patient.    Final Clinical Impressions(s) / ED Diagnoses   Final diagnoses:  Left-sided low back pain without sciatica  I personally performed the services described in this documentation, which was  scribed in my presence. The recorded information has been reviewed and is accurate.   New Prescriptions New Prescriptions   NAPROXEN (NAPROSYN) 500 MG TABLET    Take 1 tablet (500 mg total) by mouth 2 (two) times daily.     Comer Locket, PA-C 11/28/15 [place], MD 11/28/15 [phone]

## 2015-11-28 NOTE — ED Notes (Signed)
Returned from xray

## 2015-11-28 NOTE — ED Notes (Signed)
Pt transported to xray via w/c

## 2015-12-11 ENCOUNTER — Other Ambulatory Visit: Payer: Self-pay | Admitting: Family Medicine

## 2015-12-17 ENCOUNTER — Other Ambulatory Visit: Payer: Self-pay | Admitting: Family Medicine

## 2015-12-17 NOTE — Telephone Encounter (Signed)
Pt called and said that she needs her Losartan prescription called in at 100 mg so that she can start the higher dose. jw

## 2015-12-18 ENCOUNTER — Other Ambulatory Visit: Payer: Self-pay | Admitting: Family Medicine

## 2015-12-18 MED ORDER — NAPROXEN 500 MG PO TABS: 500.0000 mg | ORAL_TABLET | Freq: Two times a day (BID) | ORAL | 1 refills | Status: DC | PRN

## 2015-12-18 NOTE — Progress Notes (Deleted)
Subjective  Patient is presenting with the following illnesses     Chief Complaint noted Review of Symptoms - see HPI PMH - Smoking status noted.     Objective Vital Signs reviewed     Assessments/Plans  No problem-specific Assessment & Plan notes found for this encounter.   See Encounter view if individual problem A/Ps not visible See after visit summary for details of patient instuctions 

## 2015-12-22 MED ORDER — LOSARTAN POTASSIUM 50 MG PO TABS: 100.0000 mg | ORAL_TABLET | Freq: Every day | ORAL | 0 refills | Status: DC

## 2015-12-22 NOTE — Addendum Note (Signed)
Addended by: Valerie Roys on: 12/22/2015 04:25 PM   Modules accepted: Orders

## 2015-12-22 NOTE — Telephone Encounter (Signed)
Pt called back again about her refill on losartan---100 mg.

## 2015-12-22 NOTE — Telephone Encounter (Signed)
Will forward to MD and covering MD to change script to reflect the new dose that patient is taking of losartan 100mg .  Sherby Moncayo,CMA

## 2016-03-06 ENCOUNTER — Emergency Department (HOSPITAL_COMMUNITY)
Admission: EM | Admit: 2016-03-06 | Discharge: 2016-03-06 | Disposition: A | Discharge status: Home / Self Care | Payer: BLUE CROSS/BLUE SHIELD | Attending: Emergency Medicine | Admitting: Emergency Medicine

## 2016-03-06 ENCOUNTER — Encounter (HOSPITAL_COMMUNITY): Payer: Self-pay | Admitting: Emergency Medicine

## 2016-03-06 DIAGNOSIS — Z7982 Long term (current) use of aspirin: Secondary | ICD-10-CM | POA: Insufficient documentation

## 2016-03-06 DIAGNOSIS — I10 Essential (primary) hypertension: Secondary | ICD-10-CM

## 2016-03-06 DIAGNOSIS — I129 Hypertensive chronic kidney disease with stage 1 through stage 4 chronic kidney disease, or unspecified chronic kidney disease: Secondary | ICD-10-CM | POA: Insufficient documentation

## 2016-03-06 DIAGNOSIS — R110 Nausea: Secondary | ICD-10-CM | POA: Diagnosis not present

## 2016-03-06 DIAGNOSIS — Z79899 Other long term (current) drug therapy: Secondary | ICD-10-CM | POA: Insufficient documentation

## 2016-03-06 DIAGNOSIS — N189 Chronic kidney disease, unspecified: Secondary | ICD-10-CM | POA: Insufficient documentation

## 2016-03-06 MED ORDER — METOPROLOL TARTRATE 25 MG PO TABS
25.0000 mg | ORAL_TABLET | Freq: Once | ORAL | Status: AC
  Administered 2016-03-06: 25 mg via ORAL
  Filled 2016-03-06: qty 1

## 2016-03-06 NOTE — ED Provider Notes (Signed)
[place] DEPT Provider Note   CSN: [id number] Arrival date & time: 03/06/16  1450     History   Chief Complaint Chief Complaint  Patient presents with  . Hypertension  . Nausea    HPI [name] is a 50 y.o. female.  HPI  50 year old female history of hypertension states that she took her usual blood pressure medicines of losartan and spironolactone this morning at 10:30. She felt somewhat nauseated had some sharp pain in her left temple. She went to [facility] and had her blood pressure checked and noted that it was elevated. She states that her primary care physician, Dr. Erin Hearing told her to come to the emergency department if her blood pressure was over 140. She presents here secondary to that. She denies any current headache, change in vision, chest pain, or dyspnea. She states that her medicines have been changed recently. She has had some problems controlling her blood pressure in the past.  Past Medical History:  Diagnosis Date  . Allergy   . Arthritis    Lt ankle  . Chronic kidney disease    right renal cyst   . GERD (gastroesophageal reflux disease)    hx of no problems at present   . Headache(784.0)    occasional headache   . Hypertension   . Influenza 04/20/2012  . PONV (postoperative nausea and vomiting)    N/V and trouble urinating after surgery     Patient Active Problem List   Diagnosis Date Noted  . Flank pain 08/16/2014  . Obesity (BMI 30-39.9) 11/17/2010  . Breast ductal hyperplasia, atypical 10/30/2010  . KIDNEY CYST, ACQUIRED 03/19/2007  . HYPERTENSION, BENIGN SYSTEMIC 06/29/2006    Past Surgical History:  Procedure Laterality Date  . BREAST SURGERY  2010   lumpectomy Lt breast  . CARPAL TUNNEL RELEASE Right 08/06/2015   Procedure: RIGHT CARPAL TUNNEL RELEASE;  Surgeon: Leanora Cover, MD;  Location: [place];  Service: Orthopedics;  Laterality: Right;  . DORSAL COMPARTMENT RELEASE Right 08/06/2015   Procedure: RIGHT FIRST  RELEASE DORSAL COMPARTMENT (DEQUERVAIN);  Surgeon: Leanora Cover, MD;  Location: [place];  Service: Orthopedics;  Laterality: Right;  . ENDOMETRIAL ABLATION  October 18 2010   at [facility]  . FRACTURE SURGERY  2011   Lt ankle surgeries x 3   . JOINT REPLACEMENT     left ankle 8/12   . LAPAROSCOPIC NEPHRECTOMY  03/09/2011   Procedure: LAPAROSCOPIC NEPHRECTOMY;  Surgeon: Franchot Gallo;  Location: [facility];  Service: Urology;  Laterality: Right;  Laparoscopic Assisted Removal Of Renal Cyst   . OTHER SURGICAL HISTORY     2 hand surgeries on left and shoulder surgery on right shoulder   . TOTAL ANKLE ARTHROPLASTY Left 07/05/2012   Procedure: Removal of total ankle implants,I & D with insertion of  new poly spacer;  Surgeon: Wylene Simmer, MD;  Location: [place];  Service: Orthopedics;  Laterality: Left;  . TUBAL LIGATION  1988    OB History    No data available       Home Medications    Prior to Admission medications   Medication Sig Start Date End Date Taking? Authorizing Provider  aspirin EC 81 MG tablet Take 81 mg by mouth daily.    Historical Provider, MD  Biotin 5000 MCG TABS Take 1 tablet by mouth daily.     Historical Provider, MD  losartan (COZAAR) 50 MG tablet Take 2 tablets (100 mg total) by mouth daily. 12/22/15  Blane Ohara McDiarmid, MD  Magnesium 200 MG TABS Take 1 tablet (200 mg total) by mouth daily. 04/13/15   Caren Macadam, MD  naproxen (NAPROSYN) 500 MG tablet Take 1 tablet (500 mg total) by mouth 2 (two) times daily as needed. 12/18/15   Lind Covert, MD  spironolactone (ALDACTONE) 25 MG tablet TAKE 1 TABLET (25 MG TOTAL) BY MOUTH DAILY. 09/09/15   Lind Covert, MD  vitamin E 400 UNIT capsule Take 400 Units by mouth daily.     Historical Provider, MD    Family History Family History  Problem Relation Age of Onset  . Diabetes Mother   . Heart disease Mother   . Hypertension Father   . Diabetes Sister   . Kidney disease Sister   .  Hypertension Brother   . Kidney disease Brother   . Arthritis Brother   . Cancer Maternal Grandmother   . Diabetes Sister   . Hypertension Sister   . Hypertension Sister   . Hypertension Sister   . Hypertension Brother   . Mental illness Brother   . Colon cancer Neg Hx     Social History Social History  Substance Use Topics  . Smoking status: Never Smoker  . Smokeless tobacco: Never Used  . Alcohol use No     Allergies   Tetracycline hcl; Septra [bactrim]; and Sulfamethoxazole-trimethoprim   Review of Systems Review of Systems  Constitutional: Negative.   HENT: Negative.   Eyes: Negative.   Respiratory: Negative.   Cardiovascular: Negative.   Gastrointestinal: Negative.   Genitourinary: Negative.   Musculoskeletal: Negative.   Skin: Negative.      Physical Exam Updated Vital Signs BP 178/83 (BP Location: Left Arm)   Pulse (!) 57   Temp 98.2 F (36.8 C)   Resp 18   SpO2 100%   Physical Exam  Constitutional: She is oriented to person, place, and time. She appears well-developed and well-nourished. No distress.  HENT:  Head: Normocephalic and atraumatic.  Eyes: EOM are normal. Pupils are equal, round, and reactive to light.  Neck: Normal range of motion. Neck supple. No JVD present. No tracheal deviation present. No thyromegaly present.  Cardiovascular: Normal rate, regular rhythm, normal heart sounds and intact distal pulses.   Pulmonary/Chest: Effort normal and breath sounds normal.  Abdominal: Soft. Bowel sounds are normal.  Musculoskeletal: Normal range of motion.  Neurological: She is alert and oriented to person, place, and time.  Skin: Skin is warm. Capillary refill takes less than 2 seconds.  Psychiatric: She has a normal mood and affect.  Nursing note and vitals reviewed.    ED Treatments / Results  Labs (all labs ordered are listed, but only abnormal results are displayed) Labs Reviewed - No data to display  EKG  EKG  Interpretation None       Radiology No results found.  Procedures Procedures (including critical care time)  Medications Ordered in ED Medications  metoprolol tartrate (LOPRESSOR) tablet 25 mg (not administered)     Initial Impression / Assessment and Plan / ED Course  I have reviewed the triage vital signs and the nursing notes.  Pertinent labs & imaging results that were available during my care of the patient were reviewed by me and considered in my medical decision making (see chart for details).  Clinical Course    Reviewed patients records and has had previous bp significantly elevated but has been under better control recently.  No headache here and normal neuro exam and  I don't think it is likely that symptoms are related to hypertension.  Plan one dose lopressor and patient will follow up with her doctor early this week.  We discussed return precautions and she voices understanding.   Final Clinical Impressions(s) / ED Diagnoses   Final diagnoses:  Nausea  Essential hypertension    New Prescriptions New Prescriptions   No medications on file     Pattricia Boss, MD 03/06/16 1513

## 2016-03-06 NOTE — ED Triage Notes (Signed)
Pt. Stated, I was feeling bad and nausea this morning so [facility] and took [facility] BP and it read 183 , and my doctor told me if its over 140 to come here.

## 2016-03-09 ENCOUNTER — Encounter: Payer: Self-pay | Admitting: Family Medicine

## 2016-03-09 ENCOUNTER — Ambulatory Visit (INDEPENDENT_AMBULATORY_CARE_PROVIDER_SITE_OTHER): Payer: BLUE CROSS/BLUE SHIELD | Admitting: Family Medicine

## 2016-03-09 VITALS — BP 151/76 | HR 60 | Temp 98.5°F | Wt 231.2 lb

## 2016-03-09 DIAGNOSIS — R110 Nausea: Secondary | ICD-10-CM

## 2016-03-09 DIAGNOSIS — I10 Essential (primary) hypertension: Secondary | ICD-10-CM | POA: Diagnosis not present

## 2016-03-09 LAB — BASIC METABOLIC PANEL
BUN: 10 mg/dL (ref 7–25)
CALCIUM: 9.3 mg/dL (ref 8.6–10.4)
CO2: 26 mmol/L (ref 20–31)
Chloride: 108 mmol/L (ref 98–110)
Creat: 0.77 mg/dL (ref 0.50–1.05)
Glucose, Bld: 107 mg/dL — ABNORMAL HIGH (ref 65–99)
POTASSIUM: 4.7 mmol/L (ref 3.5–5.3)
SODIUM: 143 mmol/L (ref 135–146)

## 2016-03-09 MED ORDER — METOPROLOL TARTRATE 25 MG PO TABS: 12.5000 mg | ORAL_TABLET | Freq: Two times a day (BID) | ORAL | 1 refills | Status: DC

## 2016-03-09 NOTE — Patient Instructions (Signed)
Good to see you today!  Thanks for coming in.  Take the lopressor 1/2 tab twice a day   Monitor your blood pressure - get a meter if possible   Goal blood pressure is < 140/90  If your blood pressure is > 180/100 then call us.  If your blood pressure is > 150/90 regularly after one week then call me and we will adjust your medication  If the abdomen discomfort or nausea is not slowly improving or gets worse then call us  Come back in 2-3 weeks bring your blood pressure meter  I will call you if your lab tests are not normal.  Otherwise we will discuss them at your next visit.

## 2016-03-09 NOTE — Progress Notes (Signed)
Subjective  Patient is presenting with the following illnesses  HYPERTENSION Disease Monitoring  Home BP Monitoring (Severity) took once at home since ER visit 167/73.  Was given a single dose of lopressor in ER and felt better Symptoms - Chest pain- no    Dyspnea- no Medications (Modifying factors) Compliance-  Taking losartan and sprio. Rare NSAIDs Lightheadedness-  no  Edema- no Timing - continuous  Duration - years ROS - See HPI  NAUSEA For about the last week.  No vomiting.  Mild diffuse abdomen discomfort not really pain.  No fever.  Normal bowel movements.  Sp BTL.  No new medications  PMH Lab Review   Potassium  Date Value Ref Range Status  11/25/2015 4.0 3.5 - 5.3 mmol/L Final  03/18/2009 3.0 (L) 3.3 - 4.7 mEq/L Final   Sodium  Date Value Ref Range Status  11/25/2015 138 135 - 146 mmol/L Final  03/18/2009 136 128 - 145 mEq/L Final   Creat  Date Value Ref Range Status  11/25/2015 0.89 0.50 - 1.05 mg/dL Final    Comment:      For patients > or = 51 years of age: The upper reference limit for Creatinine is approximately 13% higher for people identified as African-American.         Chief Complaint noted Review of Symptoms - see HPI PMH - Smoking status noted.     Objective Vital Signs reviewed Heart - Regular rate and rhythm.  No murmurs, gallops or rubs.    Lungs:  Normal respiratory effort, chest expands symmetrically. Lungs are clear to auscultation, no crackles or wheezes. Abdomen: soft and very mild right UQ tenderness without masses, organomegaly or hernias noted.  No guarding or rebound     Assessments/Plans  No problem-specific Assessment & Plan notes found for this encounter.  NAUSEA No evident cause.  No signs of acute abdomen.  Check labs   See Encounter view if individual problem A/Ps not visible See after visit summary for details of patient instuctions

## 2016-03-09 NOTE — Assessment & Plan Note (Signed)
Not controlled.  Will add low dose BB given her good response in ER.  Caution with her relative bradycardia. Monitor her blood pressure closely

## 2016-03-16 ENCOUNTER — Other Ambulatory Visit: Payer: Self-pay | Admitting: Family Medicine

## 2016-05-01 ENCOUNTER — Other Ambulatory Visit: Payer: Self-pay | Admitting: Family Medicine

## 2016-05-06 ENCOUNTER — Other Ambulatory Visit: Payer: Self-pay | Admitting: Family Medicine

## 2016-06-01 DIAGNOSIS — N281 Cyst of kidney, acquired: Secondary | ICD-10-CM | POA: Diagnosis not present

## 2016-06-13 ENCOUNTER — Telehealth: Payer: Self-pay | Admitting: Family Medicine

## 2016-06-13 NOTE — Telephone Encounter (Signed)
Pt had an allergic reaction to metoprolol. Pt called the pharmacy over the weekend and was told to stop taking it. Pt made an appointment with PCP 06-15-16. ep

## 2016-06-13 NOTE — Telephone Encounter (Signed)
Left voice message for patient to return nurse call if needed.  Left message wanting to know what type of reaction she had and if see had questions.  Will forward to PCP.  Derl Barrow, RN

## 2016-06-15 ENCOUNTER — Ambulatory Visit (INDEPENDENT_AMBULATORY_CARE_PROVIDER_SITE_OTHER): Payer: BLUE CROSS/BLUE SHIELD | Admitting: Family Medicine

## 2016-06-15 ENCOUNTER — Encounter: Payer: Self-pay | Admitting: Family Medicine

## 2016-06-15 DIAGNOSIS — I10 Essential (primary) hypertension: Secondary | ICD-10-CM | POA: Diagnosis not present

## 2016-06-15 DIAGNOSIS — T783XXA Angioneurotic edema, initial encounter: Secondary | ICD-10-CM

## 2016-06-15 MED ORDER — AMLODIPINE BESYLATE 10 MG PO TABS: 10.0000 mg | ORAL_TABLET | Freq: Every day | ORAL | 1 refills | Status: DC

## 2016-06-15 NOTE — Progress Notes (Signed)
Subjective  Patient is presenting with the following illnesses  Alergic reaction About a week ago awoke with face swelling lip swelling and perhaps tongue swelling.  No shortness of breath or trouble swallowing.  Has never had before.  Now all resolved.   Stop her metoprolol that she had been on about a month.  Never been on an acei that she remembers but does take losartan.  No current swelling or chest pain or lightheadness or new edema  HYPERTENSION Disease Monitoring  Home BP Monitoring (Severity) does not have a machine but at other offices around 150/85 Symptoms - Chest pain- no    Dyspnea- no Medications (Modifying factors) Compliance-  yes. Lightheadedness-  no  Edema- no Timing - continuous  Duration - years ROS - See HPI  PMH Lab Review   Potassium  Date Value Ref Range Status  03/09/2016 4.7 3.5 - 5.3 mmol/L Final  03/18/2009 3.0 (L) 3.3 - 4.7 mEq/L Final   Sodium  Date Value Ref Range Status  03/09/2016 143 135 - 146 mmol/L Final  03/18/2009 136 128 - 145 mEq/L Final   Creat  Date Value Ref Range Status  03/09/2016 0.77 0.50 - 1.05 mg/dL Final    Comment:      For patients > or = 52 years of age: The upper reference limit for Creatinine is approximately 13% higher for people identified as African-American.         Chief Complaint noted Review of Symptoms - see HPI PMH - Smoking status noted.     Objective Vital Signs reviewed  Mouth - no lesions, mucous membranes are moist, no decaying teeth   Heart - Regular rate and rhythm.  No murmurs, gallops or rubs.    Lungs:  Normal respiratory effort, chest expands symmetrically. Lungs are clear to auscultation, no crackles or wheezes.    Assessments/Plans  No problem-specific Assessment & Plan notes found for this encounter.   See Encounter view if individual problem A/Ps not visible See after visit summary for details of patient instuctions

## 2016-06-15 NOTE — Patient Instructions (Signed)
Good to see you today!  Thanks for coming in.   For the BP  Stop the losartan and the metoprolol  The next take the amlodipine 10 mg every morning along with your spironolactone  Monitor your blood pressure and bring in the machine next visit  If your blood pressure is regularly > 150/90 then call me  Work on the weight Exercise and diet control -  Consider change to diet or just water Cut back on high calorie foods Eat three times a day   Come back in 4-5 weeks

## 2016-06-16 DIAGNOSIS — T783XXA Angioneurotic edema, initial encounter: Secondary | ICD-10-CM | POA: Insufficient documentation

## 2016-06-16 NOTE — Assessment & Plan Note (Signed)
Her description seem most consistent with angioedema.  Unsure of cause doubt beta blocker.   Will stop her ARB given the risks.  Start amlodipine and continue spironolactone and monitor blood pressure.

## 2016-06-16 NOTE — Assessment & Plan Note (Signed)
BP Readings from Last 3 Encounters:  06/15/16 120/78  03/09/16 (!) 151/76  03/06/16 178/83   At goal today.  Will stop ARB due to recent angioedema episode and add amlodipine and monitor.  See after visit summary

## 2016-07-02 ENCOUNTER — Other Ambulatory Visit: Payer: Self-pay | Admitting: Family Medicine

## 2016-07-07 ENCOUNTER — Other Ambulatory Visit: Payer: Self-pay | Admitting: Family Medicine

## 2016-07-07 DIAGNOSIS — R252 Cramp and spasm: Secondary | ICD-10-CM

## 2016-07-13 ENCOUNTER — Ambulatory Visit: Payer: BLUE CROSS/BLUE SHIELD | Admitting: Family Medicine

## 2016-07-26 ENCOUNTER — Emergency Department (HOSPITAL_COMMUNITY)
Admission: EM | Admit: 2016-07-26 | Discharge: 2016-07-26 | Disposition: A | Discharge status: Home / Self Care | Payer: BLUE CROSS/BLUE SHIELD | Attending: Emergency Medicine | Admitting: Emergency Medicine

## 2016-07-26 ENCOUNTER — Other Ambulatory Visit: Payer: Self-pay

## 2016-07-26 ENCOUNTER — Encounter (HOSPITAL_COMMUNITY): Payer: Self-pay | Admitting: *Deleted

## 2016-07-26 ENCOUNTER — Emergency Department (HOSPITAL_BASED_OUTPATIENT_CLINIC_OR_DEPARTMENT_OTHER)
Admit: 2016-07-26 | Discharge: 2016-07-26 | Disposition: A | Discharge status: Home / Self Care | Payer: BLUE CROSS/BLUE SHIELD

## 2016-07-26 ENCOUNTER — Emergency Department (HOSPITAL_COMMUNITY): Payer: BLUE CROSS/BLUE SHIELD

## 2016-07-26 DIAGNOSIS — J069 Acute upper respiratory infection, unspecified: Secondary | ICD-10-CM | POA: Diagnosis not present

## 2016-07-26 DIAGNOSIS — Z7982 Long term (current) use of aspirin: Secondary | ICD-10-CM | POA: Diagnosis not present

## 2016-07-26 DIAGNOSIS — R05 Cough: Secondary | ICD-10-CM | POA: Diagnosis not present

## 2016-07-26 DIAGNOSIS — R071 Chest pain on breathing: Secondary | ICD-10-CM | POA: Insufficient documentation

## 2016-07-26 DIAGNOSIS — M79609 Pain in unspecified limb: Secondary | ICD-10-CM | POA: Diagnosis not present

## 2016-07-26 DIAGNOSIS — M79662 Pain in left lower leg: Secondary | ICD-10-CM | POA: Insufficient documentation

## 2016-07-26 DIAGNOSIS — I129 Hypertensive chronic kidney disease with stage 1 through stage 4 chronic kidney disease, or unspecified chronic kidney disease: Secondary | ICD-10-CM | POA: Insufficient documentation

## 2016-07-26 DIAGNOSIS — N189 Chronic kidney disease, unspecified: Secondary | ICD-10-CM | POA: Insufficient documentation

## 2016-07-26 DIAGNOSIS — M7989 Other specified soft tissue disorders: Secondary | ICD-10-CM

## 2016-07-26 DIAGNOSIS — Z96652 Presence of left artificial knee joint: Secondary | ICD-10-CM | POA: Insufficient documentation

## 2016-07-26 DIAGNOSIS — Z79899 Other long term (current) drug therapy: Secondary | ICD-10-CM | POA: Insufficient documentation

## 2016-07-26 LAB — BASIC METABOLIC PANEL
ANION GAP: 8 (ref 5–15)
BUN: 10 mg/dL (ref 6–20)
CO2: 23 mmol/L (ref 22–32)
CREATININE: 0.78 mg/dL (ref 0.44–1.00)
Calcium: 8.9 mg/dL (ref 8.9–10.3)
Chloride: 104 mmol/L (ref 101–111)
Glucose, Bld: 110 mg/dL — ABNORMAL HIGH (ref 65–99)
Potassium: 4.3 mmol/L (ref 3.5–5.1)
SODIUM: 135 mmol/L (ref 135–145)

## 2016-07-26 LAB — CBC WITH DIFFERENTIAL/PLATELET
BASOS ABS: 0 10*3/uL (ref 0.0–0.1)
BASOS PCT: 0 %
EOS ABS: 0.2 10*3/uL (ref 0.0–0.7)
EOS PCT: 3 %
HEMATOCRIT: 35.5 % — AB (ref 36.0–46.0)
Hemoglobin: 12.3 g/dL (ref 12.0–15.0)
Lymphocytes Relative: 42 %
Lymphs Abs: 2.5 10*3/uL (ref 0.7–4.0)
MCH: 27.1 pg (ref 26.0–34.0)
MCHC: 34.6 g/dL (ref 30.0–36.0)
MCV: 78.2 fL (ref 78.0–100.0)
MONO ABS: 0.4 10*3/uL (ref 0.1–1.0)
MONOS PCT: 7 %
NEUTROS ABS: 2.9 10*3/uL (ref 1.7–7.7)
Neutrophils Relative %: 48 %
PLATELETS: 273 10*3/uL (ref 150–400)
RBC: 4.54 MIL/uL (ref 3.87–5.11)
RDW: 14.2 % (ref 11.5–15.5)
WBC: 6 10*3/uL (ref 4.0–10.5)

## 2016-07-26 LAB — I-STAT TROPONIN, ED: TROPONIN I, POC: 0 ng/mL (ref 0.00–0.08)

## 2016-07-26 MED ORDER — IPRATROPIUM-ALBUTEROL 0.5-2.5 (3) MG/3ML IN SOLN
3.0000 mL | Freq: Four times a day (QID) | RESPIRATORY_TRACT | Status: DC
  Administered 2016-07-26: 3 mL via RESPIRATORY_TRACT
  Filled 2016-07-26: qty 3

## 2016-07-26 NOTE — Discharge Instructions (Signed)
Stay well hydrated, treat pain and fever with ibuprofen and Tylenol. Follow-up with your primary care provider. Return to the emergency department if you experience shortness of breath, chest pain, difficulty breathing or any other concerning symptoms.

## 2016-07-26 NOTE — ED Provider Notes (Signed)
[facility] DEPT Provider Note   CSN: [id number] Arrival date & time: 07/26/16  4268     History   Chief Complaint Chief Complaint  Patient presents with  . Cough  . Generalized Body Aches    HPI [name] is a 51 y.o. female presenting with 1 week of coughing which is worse at night and associated with shortness of breath, wheezing and chest discomfort with deep inhale. Patient also reports pain in her left leg starting in the calf and coming up the back of her leg. She has noticed worsening swelling from the ankle up. She has had a left ankle replacement in 2012 which was subsequently infected 2014 and had to have surgical debridement. It is always swollen at baseline and is not any worse today but the swelling in her leg is new. She reports some ankle swelling on the right leg as well for the past month. She also started amlodipine about a month ago. She has tried over-the-counter medications for her cold without much relief. Denies fever, chills, hemoptysis, nausea, vomiting, diarrhea, generalized myalgias or other symptoms. She denies history of estrogen use, prolonged immobilization or travel, recent surgery, history of DVT/PE.   HPI  Past Medical History:  Diagnosis Date  . Allergy   . Arthritis    Lt ankle  . Chronic kidney disease    right renal cyst   . GERD (gastroesophageal reflux disease)    hx of no problems at present   . Headache(784.0)    occasional headache   . Hypertension   . Influenza 04/20/2012  . PONV (postoperative nausea and vomiting)    N/V and trouble urinating after surgery     Patient Active Problem List   Diagnosis Date Noted  . Angioedema 06/16/2016  . Flank pain 08/16/2014  . Obesity (BMI 30-39.9) 11/17/2010  . Breast ductal hyperplasia, atypical 10/30/2010  . KIDNEY CYST, ACQUIRED 03/19/2007  . HYPERTENSION, BENIGN SYSTEMIC 06/29/2006    Past Surgical History:  Procedure Laterality Date  . BREAST SURGERY  2010   lumpectomy  Lt breast  . CARPAL TUNNEL RELEASE Right 08/06/2015   Procedure: RIGHT CARPAL TUNNEL RELEASE;  Surgeon: Leanora Cover, MD;  Location: [facility];  Service: Orthopedics;  Laterality: Right;  . DORSAL COMPARTMENT RELEASE Right 08/06/2015   Procedure: RIGHT FIRST RELEASE DORSAL COMPARTMENT (DEQUERVAIN);  Surgeon: Leanora Cover, MD;  Location: [facility];  Service: Orthopedics;  Laterality: Right;  . ENDOMETRIAL ABLATION  October 18 2010   at [facility]  . FRACTURE SURGERY  2011   Lt ankle surgeries x 3   . JOINT REPLACEMENT     left ankle 8/12   . LAPAROSCOPIC NEPHRECTOMY  03/09/2011   Procedure: LAPAROSCOPIC NEPHRECTOMY;  Surgeon: Franchot Gallo;  Location: [facility];  Service: Urology;  Laterality: Right;  Laparoscopic Assisted Removal Of Renal Cyst   . OTHER SURGICAL HISTORY     2 hand surgeries on left and shoulder surgery on right shoulder   . TOTAL ANKLE ARTHROPLASTY Left 07/05/2012   Procedure: Removal of total ankle implants,I & D with insertion of  new poly spacer;  Surgeon: Wylene Simmer, MD;  Location: [facility];  Service: Orthopedics;  Laterality: Left;  . TUBAL LIGATION  1988    OB History    No data available       Home Medications    Prior to Admission medications   Medication Sig Start Date End Date Taking? Authorizing Provider  amLODipine (NORVASC) 10 MG tablet  Take 1 tablet (10 mg total) by mouth daily. 06/15/16  Yes Lind Covert, MD  aspirin EC 81 MG tablet Take 81 mg by mouth daily.   Yes Historical Provider, MD  Biotin 5000 MCG TABS Take 1 tablet by mouth daily.    Yes Historical Provider, MD  naproxen (NAPROSYN) 500 MG tablet Take 1 tablet (500 mg total) by mouth 2 (two) times daily as needed. 12/18/15  Yes Lind Covert, MD  spironolactone (ALDACTONE) 25 MG tablet TAKE 1 TABLET (25 MG TOTAL) BY MOUTH DAILY. 05/06/16  Yes Lind Covert, MD  vitamin E 400 UNIT capsule Take 400 Units by mouth daily.    Yes Historical Provider, MD     Family History Family History  Problem Relation Age of Onset  . Diabetes Mother   . Heart disease Mother   . Hypertension Father   . Diabetes Sister   . Kidney disease Sister   . Hypertension Brother   . Kidney disease Brother   . Arthritis Brother   . Cancer Maternal Grandmother   . Diabetes Sister   . Hypertension Sister   . Hypertension Sister   . Hypertension Sister   . Hypertension Brother   . Mental illness Brother   . Colon cancer Neg Hx     Social History Social History  Substance Use Topics  . Smoking status: Never Smoker  . Smokeless tobacco: Never Used  . Alcohol use No     Allergies   Tetracycline hcl; Angiotensin receptor blockers; Septra [bactrim]; and Sulfamethoxazole-trimethoprim   Review of Systems Review of Systems  Constitutional: Negative for chills and fever.  HENT: Negative for congestion, ear pain, sinus pain, sinus pressure, sore throat and trouble swallowing.   Eyes: Negative for pain and visual disturbance.  Respiratory: Positive for cough, shortness of breath and wheezing. Negative for choking and stridor.   Cardiovascular: Positive for chest pain and leg swelling. Negative for palpitations.  Gastrointestinal: Negative for abdominal distention, abdominal pain, blood in stool, diarrhea, nausea and vomiting.  Genitourinary: Negative for difficulty urinating, dysuria, flank pain and hematuria.  Musculoskeletal: Positive for myalgias. Negative for arthralgias, back pain, neck pain and neck stiffness.  Skin: Negative for color change, pallor and rash.  Neurological: Negative for dizziness, seizures, syncope, facial asymmetry, speech difficulty, weakness and light-headedness.     Physical Exam Updated Vital Signs BP (!) 141/71 (BP Location: Right Arm)   Pulse 72   Temp 98.7 F (37.1 C) (Oral)   Resp 16   Ht 5\' 8"  (1.727 m)   Wt 99.8 kg   SpO2 99%   BMI 33.45 kg/m   Physical Exam  Constitutional: She appears well-developed and  well-nourished. No distress.  Patient is afebrile, nontoxic-appearing, sitting comfortably in bed in no acute distress.  HENT:  Head: Normocephalic and atraumatic.  Eyes: Conjunctivae and EOM are normal.  Neck: Normal range of motion. Neck supple. No JVD present.  Cardiovascular: Normal rate, regular rhythm, normal heart sounds and intact distal pulses.   No murmur heard. Pulmonary/Chest: Effort normal. No respiratory distress. She has wheezes. She has no rales. She exhibits no tenderness.  Diffuse wheezing bilaterally worse on the left  Abdominal: Soft. She exhibits no distension. There is no tenderness.  Musculoskeletal: She exhibits edema and tenderness.  Left popliteal and calf tenderness to palpation. Left ankle is swollen and patient states that it is no more swollen than her baseline. No erythema or warmth.   Neurological: She is alert. No sensory deficit.  Skin: Skin is warm and dry. She is not diaphoretic. No erythema. No pallor.  Psychiatric: She has a normal mood and affect. Her behavior is normal.  Nursing note and vitals reviewed.    ED Treatments / Results  Labs (all labs ordered are listed, but only abnormal results are displayed) Labs Reviewed  CBC WITH DIFFERENTIAL/PLATELET - Abnormal; Notable for the following:       Result Value   HCT 35.5 (*)    All other components within normal limits  BASIC METABOLIC PANEL - Abnormal; Notable for the following:    Glucose, Bld 110 (*)    All other components within normal limits  I-STAT TROPOININ, ED    EKG  EKG Interpretation None       Radiology Dg Chest 2 View  Result Date: 07/26/2016 CLINICAL DATA:  Cough, wheezing for 1 week.  Congestion, fever. EXAM: CHEST  2 VIEW COMPARISON:  05/14/2015 FINDINGS: Heart and mediastinal contours are within normal limits. No focal opacities or effusions. No acute bony abnormality. IMPRESSION: No active cardiopulmonary disease. Electronically Signed   By: Rolm Baptise M.D.   On:  07/26/2016 09:02    Procedures Procedures (including critical care time)  Medications Ordered in ED Medications  ipratropium-albuterol (DUONEB) 0.5-2.5 (3) MG/3ML nebulizer solution 3 mL (3 mLs Nebulization Given 07/26/16 0925)     Initial Impression / Assessment and Plan / ED Course  I have reviewed the triage vital signs and the nursing notes.  Pertinent labs & imaging results that were available during my care of the patient were reviewed by me and considered in my medical decision making (see chart for details).     Patient presents with 1 week of nonproductive cough, chest discomfort, left leg swelling and tenderness to palpation of the calf. Lung exam reveals wheezing bilaterally worse on the left side. She reports tenderness of the left posterior lower rib cage.   Chest x-ray negative, troponin: 0, ekg NSR, LE [place] without evidence of DVT. Labs unremarkable. She is afebrile and nontoxic appearing. Vitals are stable. Given duo nebs and on reassessment she was no longer wheezing and was feeling much better.  Discharge home with conservative management and close follow-up with PCP.  Discussed strict return precautions and advised to return to the emergency department if experiencing any new or worsening symptoms. Instructions were understood and patient agreed with discharge plan.   Final Clinical Impressions(s) / ED Diagnoses   Final diagnoses:  Viral upper respiratory tract infection  Pain and swelling of lower leg, left  Chest pain on breathing    New Prescriptions New Prescriptions   No medications on file     Emeline General, PA-C 07/26/16 1141    Gareth Morgan, MD 07/30/16 [phone]

## 2016-07-26 NOTE — ED Notes (Signed)
Ambulated to bathroom walked with patient , vascular lab at bedside.

## 2016-07-26 NOTE — Progress Notes (Signed)
Preliminary results by tech - Left Lower Ext. Venous Duplex completed. Negative for deep and superficial vein thrombosis and Baker's cyst. Oda Cogan, BS, RDMS, RVT

## 2016-07-26 NOTE — ED Notes (Signed)
Pt did not need anything at this time  

## 2016-07-26 NOTE — ED Notes (Signed)
c/o dry cough onset 1 week ago states her chest and back hurt from coughing. c/o generalized bodyaches today

## 2016-08-04 ENCOUNTER — Other Ambulatory Visit: Payer: Self-pay | Admitting: Family Medicine

## 2016-08-10 ENCOUNTER — Encounter: Payer: Self-pay | Admitting: Family Medicine

## 2016-08-10 ENCOUNTER — Ambulatory Visit (INDEPENDENT_AMBULATORY_CARE_PROVIDER_SITE_OTHER): Payer: BLUE CROSS/BLUE SHIELD | Admitting: Family Medicine

## 2016-08-10 DIAGNOSIS — I10 Essential (primary) hypertension: Secondary | ICD-10-CM | POA: Diagnosis not present

## 2016-08-10 DIAGNOSIS — R609 Edema, unspecified: Secondary | ICD-10-CM

## 2016-08-10 NOTE — Patient Instructions (Signed)
Good to see you today!  Thanks for coming in.  Support hose as much as you can  Cut back on salty foods as much as you can  Cut back to 5 mg a day on amlodopine - 1/2 tab  Monitor your blood pressure goal is < 140/90.  If it is above that then call me   Continued weight loss is great  Come back in 3 months bring your machine we wil check blood test

## 2016-08-10 NOTE — Assessment & Plan Note (Signed)
Peripheral seems related to CCB.   No signs of pelvice obstruction or CHF.  Recent labs ok.  Will try decrease in amlodipine.  See after visit summary

## 2016-08-10 NOTE — Assessment & Plan Note (Signed)
At goal.  Given edema will decrease amlodipine dose

## 2016-08-10 NOTE — Progress Notes (Signed)
Subjective  Patient is presenting with the following illnesses   HYPERTENSION Disease Monitoring  Home BP Monitoring (Severity) has been well controlled Symptoms - Chest pain- no    Dyspnea- no Medications (Modifying factors) Compliance-  daily. Lightheadedness-  no  Edema- Yes see below. No lip or face swelling recurrence Timing - continuous  Duration - years ROS - See HPI  PMH Lab Review   Potassium  Date Value Ref Range Status  07/26/2016 4.3 3.5 - 5.1 mmol/L Final    Comment:    HEMOLYSIS AT THIS LEVEL MAY AFFECT RESULT  03/18/2009 3.0 (L) 3.3 - 4.7 mEq/L Final   Sodium  Date Value Ref Range Status  07/26/2016 135 135 - 145 mmol/L Final  03/18/2009 136 128 - 145 mEq/L Final   Creat  Date Value Ref Range Status  03/09/2016 0.77 0.50 - 1.05 mg/dL Final    Comment:      For patients > or = 52 years of age: The upper reference limit for Creatinine is approximately 13% higher for people identified as African-American.      Creatinine, Ser  Date Value Ref Range Status  07/26/2016 0.78 0.44 - 1.00 mg/dL Final        EDEMA Has noticed more peripheral edema lately.  No shortness of breath or orthopnea.  Is better in the AM.  Seems related to starting amlodipine. Has been told to use compression stockings in past.  Has to use different sized due to ankle replacement on the left.   Chief Complaint noted Review of Symptoms - see HPI PMH - Smoking status noted.     Objective Vital Signs reviewed Alert nad Heart - Regular rate and rhythm.  No murmurs, gallops or rubs.    Lungs:  Normal respiratory effort, chest expands symmetrically. Lungs are clear to auscultation, no crackles or wheezes. Extrem - trace edema bilaterally with surgical changes and scars on the left     Assessments/Plans  No problem-specific Assessment & Plan notes found for this encounter.   See Encounter view if individual problem A/Ps not visible See after visit summary for details of  patient instuctions

## 2016-08-12 ENCOUNTER — Ambulatory Visit (INDEPENDENT_AMBULATORY_CARE_PROVIDER_SITE_OTHER): Payer: BLUE CROSS/BLUE SHIELD | Admitting: Podiatry

## 2016-08-12 ENCOUNTER — Encounter: Payer: Self-pay | Admitting: Podiatry

## 2016-08-12 ENCOUNTER — Ambulatory Visit (INDEPENDENT_AMBULATORY_CARE_PROVIDER_SITE_OTHER): Payer: BLUE CROSS/BLUE SHIELD

## 2016-08-12 VITALS — BP 142/88 | HR 62 | Resp 16

## 2016-08-12 DIAGNOSIS — I10 Essential (primary) hypertension: Secondary | ICD-10-CM | POA: Insufficient documentation

## 2016-08-12 DIAGNOSIS — M19172 Post-traumatic osteoarthritis, left ankle and foot: Secondary | ICD-10-CM

## 2016-08-12 DIAGNOSIS — M779 Enthesopathy, unspecified: Secondary | ICD-10-CM

## 2016-08-12 DIAGNOSIS — M79672 Pain in left foot: Principal | ICD-10-CM

## 2016-08-12 DIAGNOSIS — M6740 Ganglion, unspecified site: Secondary | ICD-10-CM | POA: Insufficient documentation

## 2016-08-12 DIAGNOSIS — M79671 Pain in right foot: Secondary | ICD-10-CM

## 2016-08-12 DIAGNOSIS — M7742 Metatarsalgia, left foot: Secondary | ICD-10-CM

## 2016-08-12 DIAGNOSIS — S93491S Sprain of other ligament of right ankle, sequela: Secondary | ICD-10-CM

## 2016-08-12 MED ORDER — METHYLPREDNISOLONE 4 MG PO TBPK: ORAL_TABLET | ORAL | 0 refills | Status: DC

## 2016-08-12 MED ORDER — MELOXICAM 15 MG PO TABS: 15.0000 mg | ORAL_TABLET | Freq: Every day | ORAL | 2 refills | Status: DC

## 2016-08-12 NOTE — Patient Instructions (Signed)
Start with the medrol dose pack for 6 days (steroid) once complete you can start the mobic or meloxicam.

## 2016-08-12 NOTE — Progress Notes (Signed)
   Subjective:    Patient ID: [name], female    DOB: [birth year], 51 y.o.   MRN: [id number]  HPI 51 year old female presents the office today for concerns of bilateral foot pain. She states this has been ongoing for the last several months. She has noticed swelling to the right ankle as well as the left foot. She has a history of total ankle replacement left side in 2012 which ultimately resulted in infection she had had a surgery 1.5 years later. Over the last several months she started to have pain to left forefoot as well as the outside aspect of the right foot. She denies any recent injury or trauma. She has tried arthritis cream, ice, naproxen with some help the symptoms do continue. She has not followed up with Dr. Doran Durand recently. She's had no other treatment. No other complaints at this time.   Review of Systems  Musculoskeletal: Positive for arthralgias and gait problem.  All other systems reviewed and are negative.      Objective:   Physical Exam General: AAO x3, NAD  Dermatological: Skin is warm, dry and supple bilateral. Nails x 10 are well manicured; remaining integument appears unremarkable at this time. There are no open sores, no preulcerative lesions, no rash or signs of infection present.  Vascular: DP pulses 2/4, PT pulse 1/4, CRT less than 3 seconds. There is no pain with calf compression, swelling, warmth, erythema.  Neruologic: Grossly intact via light touch bilateral. Vibratory intact via tuning fork bilateral. Patellar and Achilles deep tendon reflexes 2+ bilateral. No Babinski or clonus noted bilateral.   Musculoskeletal: There is a decrease range of motion of the left ankle joint. Unable to get the left ankle to neutral and her forefoot is sitting in a plantar flexed position on the left side. There is mild diffuse tenderness of the dorsal aspect of the forefoot there is no specific area pinpoint bony tenderness or pain vibratory sensation. On the right ankle  there is tenderness in the course of the peroneal tendon just posterior and inferior to lateral malleolus along the course the lateral ankle ligaments and along the ATFL and CFL. There is no gross enclosed bili present. There is no erythema or increase in warmth. There is no pain to the deltoid ligaments, syndesmosis, proximal tib-fib her right foot. MMT 5/5  Gait: Unassisted, Nonantalgic.      Assessment & Plan:  51 year old female right ankle arthritis resulted in forefoot pain; left ankle tendinitis, chronic ankle sprain -Treatment options discussed including all alternatives, risks, and complications -Etiology of symptoms were discussed -X-rays were obtained and reviewed. Ankle replacements present the left ankle but there is arthritic changes around the ankle. -On the left side she'll likely benefit more from a brace. With her forefoot pain is because her forefoot is plantarflex given the arthritis in the ankle. -Prescribed a Medrol Dosepak. Once this is complete she can start meloxicam.  -She also likely benefit from an orthotic on the right side. -Trilock ankle brace on the right.  -I will order arterial studies given decreased pulses as well as swelling. -I'll have her follow with Liliane Channel for possible bracing on the left side and orthotic on the right side.  Celesta Gentile, DPM

## 2016-08-15 ENCOUNTER — Telehealth: Payer: Self-pay | Admitting: *Deleted

## 2016-08-15 ENCOUNTER — Other Ambulatory Visit: Payer: BLUE CROSS/BLUE SHIELD

## 2016-08-15 DIAGNOSIS — R0989 Other specified symptoms and signs involving the circulatory and respiratory systems: Secondary | ICD-10-CM

## 2016-08-15 DIAGNOSIS — M79671 Pain in right foot: Secondary | ICD-10-CM

## 2016-08-15 DIAGNOSIS — R609 Edema, unspecified: Secondary | ICD-10-CM

## 2016-08-15 NOTE — Telephone Encounter (Addendum)
-----   Message from Trula Slade, DPM sent at 08/15/2016  8:10 AM EDT ----- Can you please order arterial studies for decreased pulses and swelling? Orders faxed to [facility].

## 2016-08-29 ENCOUNTER — Ambulatory Visit (INDEPENDENT_AMBULATORY_CARE_PROVIDER_SITE_OTHER): Payer: BLUE CROSS/BLUE SHIELD | Admitting: Podiatry

## 2016-08-29 ENCOUNTER — Other Ambulatory Visit: Payer: BLUE CROSS/BLUE SHIELD

## 2016-08-29 ENCOUNTER — Encounter: Payer: Self-pay | Admitting: Podiatry

## 2016-08-29 DIAGNOSIS — M779 Enthesopathy, unspecified: Secondary | ICD-10-CM

## 2016-08-29 DIAGNOSIS — M79672 Pain in left foot: Secondary | ICD-10-CM | POA: Diagnosis not present

## 2016-08-29 DIAGNOSIS — M7742 Metatarsalgia, left foot: Secondary | ICD-10-CM | POA: Diagnosis not present

## 2016-08-29 DIAGNOSIS — S93491S Sprain of other ligament of right ankle, sequela: Secondary | ICD-10-CM | POA: Diagnosis not present

## 2016-08-29 NOTE — Progress Notes (Signed)
Subjective: 51 year old female presents the office they for follow-up evaluation of bilateral foot pain. She said left side is doing better but she still any pain to the outside aspect of the right ankle. She states the swelling is much improved and she is continuing ankle brace although she is not having out today. She also presents today to be symmetric for evaluation of the left foot/ankle. Denies any systemic complaints such as fevers, chills, nausea, vomiting. No acute changes since last appointment, and no other complaints at this time.   Objective: AAO x3, NAD DP/PT pulses palpable bilaterally, CRT less than 3 seconds On the left foot there is improved tenderness on the metatarsals. The foot is fixed and slightly plantar flexed position due to the ankle arthritis. On the right centers continued tenderness to palpation mostly to lateral aspect of ankle Mrs. on the course the peroneal tendon posterior and inferior to lateral malleolus. The tendons appear to be intact. There is mild continued edema however this is improved. There is no erythema or increase in length. Is also some minimal discomfort on the course lateral ankle ligaments. There is no area pinpoint bony tenderness or pain the vibratory sensation. No open lesions or pre-ulcerative lesions.  No pain with calf compression, swelling, warmth, erythema  Assessment: Left forefoot pain due to plantar flexed position; right lateral ankle pain, rule out tear  Plan: -All treatment options discussed with the patient including all alternatives, risks, complications.  -At this point I recommended MRI the right ankle to rule out tendon tear and evaluate the integrity the lateral ankle ligaments. For now continue with the ankle brace. Ice and elevation. -On the left side she was evaluated by Liliane Channel for likely rocker-bottom shoe versus brace. -She is scheduled for arterial studies next week -I'll see her back in the near future when she gets the  MRI of the arterial studies or sooner if needed -Patient encouraged to call the office with any questions, concerns, change in symptoms.   Celesta Gentile, DPM

## 2016-08-29 NOTE — Telephone Encounter (Addendum)
-----   Message from Trula Slade, DPM sent at 08/29/2016 10:04 AM EDT ----- Can you please order an MRi of the right ankle to rule out tendon tear and lateral ankle ligament tear. Thanks. Orders given to [facility]. [facility] and faxed to [place].

## 2016-09-06 ENCOUNTER — Telehealth: Payer: Self-pay | Admitting: *Deleted

## 2016-09-06 NOTE — Telephone Encounter (Signed)
"  This patient is scheduled for a MRI right ankle on 09/10/2016.  She has [place] have to go through [facility] to get an authorization."  I will work on getting it authorized.

## 2016-09-07 ENCOUNTER — Other Ambulatory Visit: Payer: Self-pay | Admitting: Podiatry

## 2016-09-07 ENCOUNTER — Ambulatory Visit (HOSPITAL_COMMUNITY)
Admission: RE | Admit: 2016-09-07 | Discharge: 2016-09-07 | Disposition: A | Discharge status: Home / Self Care | Payer: BLUE CROSS/BLUE SHIELD | Source: Ambulatory Visit | Attending: Cardiology | Admitting: Cardiology

## 2016-09-07 DIAGNOSIS — R609 Edema, unspecified: Secondary | ICD-10-CM

## 2016-09-07 DIAGNOSIS — R0989 Other specified symptoms and signs involving the circulatory and respiratory systems: Secondary | ICD-10-CM | POA: Diagnosis not present

## 2016-09-10 ENCOUNTER — Inpatient Hospital Stay: Admission: RE | Admit: 2016-09-10 | Payer: BLUE CROSS/BLUE SHIELD | Source: Ambulatory Visit

## 2016-09-14 ENCOUNTER — Telehealth: Payer: Self-pay | Admitting: *Deleted

## 2016-09-14 NOTE — Telephone Encounter (Addendum)
-----   Message from Trula Slade, DPM sent at 09/13/2016  7:19 AM EDT ----- Circulation studies are normal; please let her know. Thanks.09/14/2016-Left message informing pt the circulation test were normal.

## 2016-09-15 ENCOUNTER — Ambulatory Visit (INDEPENDENT_AMBULATORY_CARE_PROVIDER_SITE_OTHER): Payer: BLUE CROSS/BLUE SHIELD | Admitting: Podiatry

## 2016-09-15 ENCOUNTER — Encounter: Payer: Self-pay | Admitting: Podiatry

## 2016-09-15 DIAGNOSIS — M19172 Post-traumatic osteoarthritis, left ankle and foot: Secondary | ICD-10-CM | POA: Diagnosis not present

## 2016-09-15 DIAGNOSIS — M779 Enthesopathy, unspecified: Secondary | ICD-10-CM

## 2016-09-15 DIAGNOSIS — M7742 Metatarsalgia, left foot: Secondary | ICD-10-CM | POA: Diagnosis not present

## 2016-09-16 NOTE — Progress Notes (Signed)
Subjective: 51 year old female presents the office they for follow-up evaluation of bilateral foot pain. She states that she did not get the MRI done because she forgot about the appointment last week and she has not been able to reschedule. She continues to get pain to the outside aspect of the right foot/ankle. She also gets pain to the left foot which is unchanged. She's been measured for shoes due to cost she was torn off on them. She states this for a diabetic shoe although she is not diabetic. Will discussed this with Liliane Channel. Denies any systemic complaints such as fevers, chills, nausea, vomiting. No acute changes since last appointment, and no other complaints at this time.   Objective: AAO x3, NAD DP/PT pulses palpable bilaterally, CRT less than 3 seconds On the right foot there is tenderness lateral aspect of the right foot on the sinus tarsi. There is minimal discomfort on the course the peroneal tendons. There is minimal edema to the foot is no erythema or increase in warmth. No pain with ankle or subtalar range of motion. On the left foot there continues to be diffuse forefoot pain. There is no overlying edema, erythema, increase in warmth. Foot semirigid plantar flexed position. There is no swelling or redness left foot. No open lesions or pre-ulcerative lesions.  No pain with calf compression, swelling, warmth, erythema  Assessment: Right foot subtalar joint capsulitis; left forefoot pain likely biomechanical due to  plantar flexed position  Plan: -All treatment options discussed with the patient including all alternatives, risks, complications.  -On the right foot recommended a steroid injection into the sinus tarsi. She wishes to pursue this. She'll conditions a mixture Kenalog and local anesthetic was infiltrated into the sinus tarsi without complications. Post injection care was discussed. Continue ankle brace. -Awaiting custom shoe on the left foot but also discussed a brace. I'll  discuss this with Liliane Channel. -Patient encouraged to call the office with any questions, concerns, change in symptoms.   [name], DPM

## 2016-09-19 ENCOUNTER — Telehealth: Payer: Self-pay | Admitting: Orthotics

## 2016-09-20 NOTE — Telephone Encounter (Signed)
MRI was authorized.  Patient is scheduled for June 1.

## 2016-09-30 ENCOUNTER — Ambulatory Visit
Admission: RE | Admit: 2016-09-30 | Discharge: 2016-09-30 | Disposition: A | Discharge status: Home / Self Care | Payer: BLUE CROSS/BLUE SHIELD | Source: Ambulatory Visit | Attending: Podiatry | Admitting: Podiatry

## 2016-09-30 DIAGNOSIS — M79671 Pain in right foot: Secondary | ICD-10-CM | POA: Diagnosis not present

## 2016-10-05 ENCOUNTER — Telehealth: Payer: Self-pay | Admitting: *Deleted

## 2016-10-05 NOTE — Telephone Encounter (Addendum)
-----   Message from Trula Slade, DPM sent at 10/05/2016  7:08 AM EDT ----- Right ankle MRI normal- please let her know. Please have her try compression socks. Thanks. I informed pt of Dr. Leigh Aurora review of MRI results and orders. Pt states she has compression socks Dr. Jacqualyn Posey gave and diabetic socks. I ask pt if the compression socks came to the knee, pt states come over the ankle. I told her diabetic socks were not the same as compression socks which assist the body in moving waste fluid from the legs. I told her I would fax the order to [facility] on [address] [phone], and she could call to see if needed an appt for fitting. Pt states understanding and ask if she needed to keep the appt 10/13/2016 and I told her yes, so we could monitor the swelling. Faxed orders to [facility].

## 2016-10-13 ENCOUNTER — Encounter: Payer: Self-pay | Admitting: Podiatry

## 2016-10-13 ENCOUNTER — Ambulatory Visit (INDEPENDENT_AMBULATORY_CARE_PROVIDER_SITE_OTHER): Payer: BLUE CROSS/BLUE SHIELD | Admitting: Podiatry

## 2016-10-13 DIAGNOSIS — M7742 Metatarsalgia, left foot: Secondary | ICD-10-CM

## 2016-10-13 DIAGNOSIS — M19172 Post-traumatic osteoarthritis, left ankle and foot: Secondary | ICD-10-CM | POA: Diagnosis not present

## 2016-10-13 DIAGNOSIS — M779 Enthesopathy, unspecified: Secondary | ICD-10-CM | POA: Diagnosis not present

## 2016-10-13 NOTE — Progress Notes (Signed)
Subjective: 51 year old female presents the office if operative valuation bilateral foot pain. She states that so far she continues to pain and she still notices some mild swelling to her feet and that no treatment we have done is helped so far. She has not been casted for a brace. She has not followed up with Liliane Channel. His his been ongoing for quite some time and her symptoms have been unchanged. Denies any systemic complaints such as fevers, chills, nausea, vomiting. No acute changes since last appointment, and no other complaints at this time.   Objective: AAO x3, NAD DP/PT pulses palpable bilaterally, CRT less than 3 seconds On the right foot there is tenderness continuation on the lateral aspect of the foot on the sinus tarsi of localized edema to this area. There is no erythema or increase in warmth. There is no discomfort along the course of the peroneal tendons today. There is no other area pinpoint tenderness of the right foot. On the left side there is no range of motion of the ankle joint. The foot does sit in a plantar flexed position. Overall exam appears to be mostly unchanged. No open lesions or pre-ulcerative lesions.  No pain with calf compression, swelling, warmth, erythema  Assessment: Capsulitis right foot, plantarflexed foot left side due to ankle arthritis  Plan: -All treatment options discussed with the patient including all alternatives, risks, complications.  -I had Rick evaluate her today for a brace. She went ahead and got casted for brace on the left side. We also discussed the deep heel cup orthotic on the right side. After discussion of Rozann Lesches over the brace on left side. I also discussed shoe modifications as well. -Patient encouraged to call the office with any questions, concerns, change in symptoms.   Celesta Gentile, DPM

## 2016-10-18 ENCOUNTER — Encounter: Payer: Self-pay | Admitting: Family Medicine

## 2016-10-18 ENCOUNTER — Ambulatory Visit (INDEPENDENT_AMBULATORY_CARE_PROVIDER_SITE_OTHER): Payer: BLUE CROSS/BLUE SHIELD | Admitting: Family Medicine

## 2016-10-18 VITALS — BP 130/80 | HR 72 | Temp 98.7°F | Ht 68.0 in | Wt 234.6 lb

## 2016-10-18 DIAGNOSIS — R600 Localized edema: Secondary | ICD-10-CM | POA: Diagnosis not present

## 2016-10-18 DIAGNOSIS — I10 Essential (primary) hypertension: Secondary | ICD-10-CM

## 2016-10-18 MED ORDER — METOPROLOL TARTRATE 25 MG PO TABS: ORAL_TABLET | ORAL | 1 refills | Status: DC

## 2016-10-18 NOTE — Assessment & Plan Note (Signed)
Chronic issue Likely a combination of calcium channel blocker use, NSAID use, venous insufficiency in the setting of being on her feet throughout the day We will discontinue amlodipine as decreasing the dose did help some with her swelling Currently, she needs NSAID for ankle problems, but advised her this may be contributing as well Check CMP, CBC, TSH to evaluate for other causes of edema Advise compression stockings, moving around, and elevating feet when able Follow-up in one month

## 2016-10-18 NOTE — Patient Instructions (Signed)
Nice to meet you today. We are getting some labs today and someone will call you or send you a letter with the results when they're available. Stop taking amlodipine. Start taking metoprolol twice daily instead for your blood pressure. The meloxicam may also be contributing to your swelling. Wear compression socks as we discussed. Stay moving to help the fluid get back up from your legs.  Follow-up for your annual checkup with your primary care doctor next month and we will recheck your swelling and blood pressure at that time.  Take care, Dr. Jacinto Reap

## 2016-10-18 NOTE — Progress Notes (Signed)
Subjective:   [name] is a 51 y.o. female with a history of HTN, breast ductal hyperplasia, obesity here for  Chief Complaint  Patient presents with  . Follow-up    BP     HTN: - Medications: Amlodipine 5 mg daily, spironolactone 25 mg daily - Compliance: good - Checking BP at home: yes, 110-130/70-80s - Denies any SOB, CP, vision changes, medication SEs, or symptoms of hypotension - Diet: eats out a lot - Exercise: somewhat limited by previous ankle surgery  LE edema States that she continues to retain a lot of fluid in both feet States that ankle MRIs are fine Worse at the end of the day Did get a little better after decreasing amlodipine to 5mg  daily Stays on her feet a lot during the day Seems to urinate a lot at night time Taking meloxicam for ankle pain per Podiatry x1 month L>R due to previous L ankle surgery   Review of Systems:  Per HPI.   Social History: never smoker  Objective:  BP 130/80   Pulse 72   Temp 98.7 F (37.1 C) (Oral)   Ht 5\' 8"  (1.727 m)   Wt 234 lb 9.6 oz (106.4 kg)   SpO2 99%   BMI 35.67 kg/m   Gen:  51 y.o. female in NAD HEENT: NCAT, MMM, EOMI, PERRL, anicteric sclerae, OP clear CV: RRR, no MRG Resp: Non-labored, CTAB, no wheezes noted Abd: Soft, NTND, BS present, no guarding or organomegaly Ext: WWP, no edema R leg, edema of L ankle MSK: L ankle with limited ROM and edema Neuro: Alert and oriented, speech normal       Chemistry      Component Value Date/Time   NA 135 07/26/2016 0928   NA 136 03/18/2009 1439   K 4.3 07/26/2016 0928   K 3.0 (L) 03/18/2009 1439   CL 104 07/26/2016 0928   CL 99 03/18/2009 1439   CO2 23 07/26/2016 0928   CO2 29 03/18/2009 1439   BUN 10 07/26/2016 0928   BUN 13 03/18/2009 1439   CREATININE 0.78 07/26/2016 0928   CREATININE 0.77 03/09/2016 0910      Component Value Date/Time   CALCIUM 8.9 07/26/2016 0928   CALCIUM 8.9 03/18/2009 1439   ALKPHOS 87 10/04/2012 1213   ALKPHOS 65  03/18/2009 1439   AST 26 10/04/2012 1213   AST 28 03/18/2009 1439   ALT 37 (H) 10/04/2012 1213   ALT 22 03/18/2009 1439   BILITOT 0.5 10/04/2012 1213   BILITOT 0.50 03/18/2009 1439      Lab Results  Component Value Date   WBC 6.0 07/26/2016   HGB 12.3 07/26/2016   HCT 35.5 (L) 07/26/2016   MCV 78.2 07/26/2016   PLT 273 07/26/2016   Lab Results  Component Value Date   TSH 1.445 12/11/2012   Lab Results  Component Value Date   HGBA1C 5.9 12/11/2012   Assessment & Plan:     [name] is a 51 y.o. female here for   Lower extremity edema Chronic issue Likely a combination of calcium channel blocker use, NSAID use, venous insufficiency in the setting of being on her feet throughout the day We will discontinue amlodipine as decreasing the dose did help some with her swelling Currently, she needs NSAID for ankle problems, but advised her this may be contributing as well Check CMP, CBC, TSH to evaluate for other causes of edema Advise compression stockings, moving around, and elevating feet when able  Follow-up in one month  HYPERTENSION, BENIGN SYSTEMIC Currently well controlled Given edema, will discontinue amlodipine She will likely need something else for her hypertension after stopping amlodipine, so we will start Metoprolol 12.5 mg twice a day F/u in 1 month   Bacigalupo, Dionne Bucy, MD MPH PGY-3,  [facility] 10/18/2016  10:39 AM

## 2016-10-18 NOTE — Assessment & Plan Note (Signed)
Currently well controlled Given edema, will discontinue amlodipine She will likely need something else for her hypertension after stopping amlodipine, so we will start Metoprolol 12.5 mg twice a day F/u in 1 month

## 2016-10-19 ENCOUNTER — Encounter: Payer: Self-pay | Admitting: Family Medicine

## 2016-10-19 LAB — CMP14+EGFR
ALT: 19 IU/L (ref 0–32)
AST: 17 IU/L (ref 0–40)
Albumin/Globulin Ratio: 1.1 — ABNORMAL LOW (ref 1.2–2.2)
Albumin: 3.9 g/dL (ref 3.5–5.5)
Alkaline Phosphatase: 108 IU/L (ref 39–117)
BUN/Creatinine Ratio: 13 (ref 9–23)
BUN: 11 mg/dL (ref 6–24)
Bilirubin Total: 0.2 mg/dL (ref 0.0–1.2)
CALCIUM: 8.9 mg/dL (ref 8.7–10.2)
CO2: 24 mmol/L (ref 20–29)
CREATININE: 0.83 mg/dL (ref 0.57–1.00)
Chloride: 105 mmol/L (ref 96–106)
GFR calc Af Amer: 94 mL/min/{1.73_m2} (ref >59)
GFR, EST NON AFRICAN AMERICAN: 81 mL/min/{1.73_m2} (ref >59)
GLOBULIN, TOTAL: 3.4 g/dL (ref 1.5–4.5)
Glucose: 111 mg/dL — ABNORMAL HIGH (ref 65–99)
Potassium: 4.4 mmol/L (ref 3.5–5.2)
Sodium: 142 mmol/L (ref 134–144)
Total Protein: 7.3 g/dL (ref 6.0–8.5)

## 2016-10-19 LAB — CBC
HEMATOCRIT: 38.6 % (ref 34.0–46.6)
HEMOGLOBIN: 12.7 g/dL (ref 11.1–15.9)
MCH: 26.7 pg (ref 26.6–33.0)
MCHC: 32.9 g/dL (ref 31.5–35.7)
MCV: 81 fL (ref 79–97)
PLATELETS: 286 10*3/uL (ref 150–379)
RBC: 4.75 x10E6/uL (ref 3.77–5.28)
RDW: 15 % (ref 12.3–15.4)
WBC: 4.8 10*3/uL (ref 3.4–10.8)

## 2016-10-19 LAB — TSH: TSH: 1.84 u[IU]/mL (ref 0.450–4.500)

## 2016-10-26 ENCOUNTER — Encounter (HOSPITAL_COMMUNITY): Payer: Self-pay | Admitting: Emergency Medicine

## 2016-10-26 ENCOUNTER — Emergency Department (HOSPITAL_COMMUNITY): Payer: BLUE CROSS/BLUE SHIELD

## 2016-10-26 ENCOUNTER — Emergency Department (HOSPITAL_BASED_OUTPATIENT_CLINIC_OR_DEPARTMENT_OTHER)
Admit: 2016-10-26 | Discharge: 2016-10-26 | Disposition: A | Discharge status: Home / Self Care | Payer: BLUE CROSS/BLUE SHIELD

## 2016-10-26 ENCOUNTER — Emergency Department (HOSPITAL_COMMUNITY)
Admission: EM | Admit: 2016-10-26 | Discharge: 2016-10-26 | Disposition: A | Discharge status: Home / Self Care | Payer: BLUE CROSS/BLUE SHIELD | Attending: Emergency Medicine | Admitting: Emergency Medicine

## 2016-10-26 DIAGNOSIS — M7989 Other specified soft tissue disorders: Secondary | ICD-10-CM | POA: Diagnosis not present

## 2016-10-26 DIAGNOSIS — N189 Chronic kidney disease, unspecified: Secondary | ICD-10-CM | POA: Diagnosis not present

## 2016-10-26 DIAGNOSIS — Z7982 Long term (current) use of aspirin: Secondary | ICD-10-CM | POA: Insufficient documentation

## 2016-10-26 DIAGNOSIS — Z96662 Presence of left artificial ankle joint: Secondary | ICD-10-CM | POA: Diagnosis not present

## 2016-10-26 DIAGNOSIS — M79609 Pain in unspecified limb: Secondary | ICD-10-CM | POA: Diagnosis not present

## 2016-10-26 DIAGNOSIS — Z79899 Other long term (current) drug therapy: Secondary | ICD-10-CM | POA: Diagnosis not present

## 2016-10-26 DIAGNOSIS — I129 Hypertensive chronic kidney disease with stage 1 through stage 4 chronic kidney disease, or unspecified chronic kidney disease: Secondary | ICD-10-CM | POA: Diagnosis not present

## 2016-10-26 DIAGNOSIS — M25572 Pain in left ankle and joints of left foot: Secondary | ICD-10-CM | POA: Diagnosis not present

## 2016-10-26 MED ORDER — OXYCODONE-ACETAMINOPHEN 5-325 MG PO TABS
1.0000 | ORAL_TABLET | Freq: Once | ORAL | Status: AC
  Administered 2016-10-26: 1 via ORAL
  Filled 2016-10-26: qty 1

## 2016-10-26 MED ORDER — TRAMADOL HCL 50 MG PO TABS: 50.0000 mg | ORAL_TABLET | Freq: Four times a day (QID) | ORAL | 0 refills | Status: DC | PRN

## 2016-10-26 MED ORDER — DICLOFENAC SODIUM 50 MG PO TBEC: 50.0000 mg | DELAYED_RELEASE_TABLET | Freq: Two times a day (BID) | ORAL | 0 refills | Status: DC

## 2016-10-26 NOTE — Discharge Instructions (Signed)
Do not drive while taking the narcotic pain medication as it will make you sleepy. Follow up with your doctor. Return here as needed.  °

## 2016-10-26 NOTE — Progress Notes (Signed)
*  PRELIMINARY RESULTS* Vascular Ultrasound left lower extremity venous duplex has been completed.  Preliminary findings: No evidence of deep vein thrombosis or baker's cysts in the left lower extremity.  Preliminary results given to nurse, [facility] @ 19:20   Everrett Coombe 10/26/2016, 7:19 PM

## 2016-10-26 NOTE — ED Notes (Signed)
venous study at the bedside.

## 2016-10-26 NOTE — ED Provider Notes (Signed)
[facility] DEPT Provider Note   CSN: [id number] Arrival date & time: 10/26/16  1657  By signing my name below, I, Levester Fresh, attest that this documentation has been prepared under the direction and in the presence of Debroah Baller, NP .  Electronically Signed: Levester Fresh, Scribe. 11/01/2016. 1:55 AM.  History   Chief Complaint Chief Complaint  Patient presents with  . Ankle Pain   HPI Comments [name] is a 51 y.o. female with a PMHx significant for HTN, breast ductal hyperplasia and obesity, PSHx of left ankle replacement in 2012, who presents to the Emergency Department with complaints of a gradually worsening left ankle pain x1wk, currently rated a 5/10 in severity.  Radiation to her left calf and thigh, described as spasms and tingling.  Associated edema, which per pt is baseline.  Pain is worse with bearing weight and ambulation.  No numbness or weakness.  Pt has been followed by a podiatrist, Dr. Jacqualyn Posey.  Normal MRI on 10/05/2016.  She reports a hx of infection to the ankle s/p hardware placement.  Pt denies experiencing any other acute sx, including nausea, vomiting, fevers or chills.   The history is provided by the patient and medical records. No language interpreter was used.   Past Medical History:  Diagnosis Date  . Allergy   . Arthritis    Lt ankle  . Chronic kidney disease    right renal cyst   . GERD (gastroesophageal reflux disease)    hx of no problems at present   . Headache(784.0)    occasional headache   . Hypertension   . Influenza 04/20/2012  . PONV (postoperative nausea and vomiting)    N/V and trouble urinating after surgery     Patient Active Problem List   Diagnosis Date Noted  . Lower extremity edema 10/18/2016  . Ganglion cyst 08/12/2016  . High blood pressure 08/12/2016  . Edema 08/10/2016  . Angioedema 06/16/2016  . Obesity (BMI 30-39.9) 11/17/2010  . Breast ductal hyperplasia, atypical 10/30/2010  . KIDNEY CYST, ACQUIRED  03/19/2007  . HYPERTENSION, BENIGN SYSTEMIC 06/29/2006    Past Surgical History:  Procedure Laterality Date  . BREAST SURGERY  2010   lumpectomy Lt breast  . CARPAL TUNNEL RELEASE Right 08/06/2015   Procedure: RIGHT CARPAL TUNNEL RELEASE;  Surgeon: Leanora Cover, MD;  Location: [facility];  Service: Orthopedics;  Laterality: Right;  . DORSAL COMPARTMENT RELEASE Right 08/06/2015   Procedure: RIGHT FIRST RELEASE DORSAL COMPARTMENT (DEQUERVAIN);  Surgeon: Leanora Cover, MD;  Location: [facility];  Service: Orthopedics;  Laterality: Right;  . ENDOMETRIAL ABLATION  October 18 2010   at [facility]  . FRACTURE SURGERY  2011   Lt ankle surgeries x 3   . JOINT REPLACEMENT     left ankle 8/12   . LAPAROSCOPIC NEPHRECTOMY  03/09/2011   Procedure: LAPAROSCOPIC NEPHRECTOMY;  Surgeon: Franchot Gallo;  Location: [facility];  Service: Urology;  Laterality: Right;  Laparoscopic Assisted Removal Of Renal Cyst   . OTHER SURGICAL HISTORY     2 hand surgeries on left and shoulder surgery on right shoulder   . TOTAL ANKLE ARTHROPLASTY Left 07/05/2012   Procedure: Removal of total ankle implants,I & D with insertion of  new poly spacer;  Surgeon: Wylene Simmer, MD;  Location: [facility];  Service: Orthopedics;  Laterality: Left;  . TUBAL LIGATION  1988    OB History    No data available  Home Medications    Prior to Admission medications   Medication Sig Start Date End Date Taking? Authorizing Provider  aspirin EC 81 MG tablet Take 81 mg by mouth daily.    [provider]  Biotin 5000 MCG TABS Take 1 tablet by mouth daily.     [provider]  diclofenac (VOLTAREN) 50 MG EC tablet Take 1 tablet (50 mg total) by mouth 2 (two) times daily. 10/26/16   Ashley Murrain, NP  meloxicam (MOBIC) 15 MG tablet Take 1 tablet (15 mg total) by mouth daily. 08/12/16 08/12/17  Trula Slade, DPM  metoprolol tartrate (LOPRESSOR) 25 MG tablet TAKE 0.5 TABLETS (12.5 MG TOTAL) BY MOUTH 2  (TWO) TIMES DAILY. 10/18/16   Virginia Crews, MD  spironolactone (ALDACTONE) 25 MG tablet TAKE 1 TABLET (25 MG TOTAL) BY MOUTH DAILY. 05/06/16   Lind Covert, MD  traMADol (ULTRAM) 50 MG tablet Take 1 tablet (50 mg total) by mouth every 6 (six) hours as needed. 10/26/16   Ashley Murrain, NP  vitamin E 400 UNIT capsule Take 400 Units by mouth daily.     [provider]    Family History Family History  Problem Relation Age of Onset  . Diabetes Mother   . Heart disease Mother   . Hypertension Father   . Diabetes Sister   . Kidney disease Sister   . Hypertension Brother   . Kidney disease Brother   . Arthritis Brother   . Cancer Maternal Grandmother   . Diabetes Sister   . Hypertension Sister   . Hypertension Sister   . Hypertension Sister   . Hypertension Brother   . Mental illness Brother   . Colon cancer Neg Hx     Social History Social History  Substance Use Topics  . Smoking status: Never Smoker  . Smokeless tobacco: Never Used  . Alcohol use No     Allergies   Tetracycline hcl; Angiotensin receptor blockers; Septra [bactrim]; and Sulfamethoxazole-trimethoprim   Review of Systems Review of Systems  Constitutional: Negative for chills and fever.  Gastrointestinal: Negative for nausea and vomiting.  Musculoskeletal: Positive for arthralgias.  Neurological: Negative for weakness and numbness.   Physical Exam Updated Vital Signs BP (!) 160/81 (BP Location: Right Arm)   Pulse (!) 45   Temp 98.4 F (36.9 C) (Oral)   Resp 19   Ht 5\' 8"  (1.727 m)   Wt 97.5 kg (215 lb)   SpO2 95%   BMI 32.69 kg/m   Physical Exam  Constitutional: She is oriented to person, place, and time. She appears well-developed and well-nourished.  Eyes: EOM are normal.  Neck: Neck supple.  Pulmonary/Chest: Effort normal.  Abdominal: Soft. There is no tenderness.  Musculoskeletal:  Left ankle with edema, tenderness to palpation.  Limited range of motion due to  swelling and prior surgery.  There are scars noted s/p ankle replacement from 2012.  Pedal pulses 2+, adequate circulation.  Neurological: She is alert and oriented to person, place, and time. No cranial nerve deficit.  Skin: Skin is warm and dry.  Psychiatric: She has a normal mood and affect.  Nursing note and vitals reviewed.  ED Treatments / Results  DIAGNOSTIC STUDIES: Oxygen Saturation is 95% on room air, adequate by my interpretation.    COORDINATION OF CARE: 6:03 PM Discussed treatment plan with pt at bedside and pt agreed to plan.  Labs (all labs ordered are listed, but only abnormal results are displayed) Labs Reviewed -  No data to display   Radiology Dg Ankle Complete Left  Result Date: 10/26/2016 CLINICAL DATA:  Left ankle pain.  Prior ankle replacement. EXAM: LEFT ANKLE COMPLETE - 3+ VIEW COMPARISON:  10/02/2015. FINDINGS: Diffuse soft-tissue swelling again noted. Left ankle joint replacement with tibial and talar components in stable position. Hardware intact. Lucencies are noted about the tibial and talar components. Loosening cannot be excluded. No evidence of fracture or dislocation. Heterotopic bone noted adjacent to the ankle joint. IMPRESSION: 1. Diffuse soft tissue swelling.  No acute abnormality. 2. Left ankle joint replacement with tibial and talar components in stable position. Lucencies are noted about the tibial and talar components. Loosening of these components cannot be excluded . Electronically Signed   By: Marcello Moores  Register   On: 10/26/2016 17:43    Procedures Procedures (including critical care time)  Medications Ordered in ED Medications  oxyCODONE-acetaminophen (PERCOCET/ROXICET) 5-325 MG per tablet 1 tablet (1 tablet Oral Given 10/26/16 1930)     Initial Impression / Assessment and Plan / ED Course  I have reviewed the triage vital signs and the nursing notes.  Pertinent imaging results that were available during my care of the patient were  reviewed by me and considered in my medical decision making (see chart for details).  Patient X-Ray negative for obvious fracture or dislocation.  Pt advised to follow up with orthopedics for further evaluation of the hardware of the ankle replacement. Patient given ace wrap while in ED, conservative therapy recommended and discussed. Patient will be discharged home & is agreeable with above plan. Returns precautions discussed. Pt appears safe for discharge.  Final Clinical Impressions(s) / ED Diagnoses   Final diagnoses:  Acute left ankle pain   I personally performed the services described in this documentation, which was scribed in my presence. The recorded information has been reviewed and is accurate.  New Prescriptions Discharge Medication List as of 10/26/2016  7:23 PM    START taking these medications   Details  diclofenac (VOLTAREN) 50 MG EC tablet Take 1 tablet (50 mg total) by mouth 2 (two) times daily., Starting Wed 10/26/2016, Print    traMADol (ULTRAM) 50 MG tablet Take 1 tablet (50 mg total) by mouth every 6 (six) hours as needed., Starting Wed 10/26/2016, Print         [place], [place], NP 11/01/16 0159    Julianne Rice, MD 11/03/16 1148

## 2016-10-26 NOTE — ED Triage Notes (Signed)
Pt reports left ankle pain for the last week, pt has history on ankle replacement. Worse with walking and feels like spasms in calf.

## 2016-11-05 ENCOUNTER — Other Ambulatory Visit: Payer: Self-pay | Admitting: Podiatry

## 2016-11-14 ENCOUNTER — Ambulatory Visit: Payer: BLUE CROSS/BLUE SHIELD | Admitting: Orthotics

## 2016-11-18 ENCOUNTER — Ambulatory Visit (INDEPENDENT_AMBULATORY_CARE_PROVIDER_SITE_OTHER): Payer: BLUE CROSS/BLUE SHIELD | Admitting: Orthotics

## 2016-11-18 DIAGNOSIS — M779 Enthesopathy, unspecified: Secondary | ICD-10-CM | POA: Diagnosis not present

## 2016-11-18 DIAGNOSIS — M19172 Post-traumatic osteoarthritis, left ankle and foot: Secondary | ICD-10-CM

## 2016-11-18 DIAGNOSIS — M7742 Metatarsalgia, left foot: Secondary | ICD-10-CM

## 2016-11-23 ENCOUNTER — Ambulatory Visit (INDEPENDENT_AMBULATORY_CARE_PROVIDER_SITE_OTHER): Payer: BLUE CROSS/BLUE SHIELD | Admitting: Family Medicine

## 2016-11-23 ENCOUNTER — Encounter: Payer: Self-pay | Admitting: Family Medicine

## 2016-11-23 DIAGNOSIS — R609 Edema, unspecified: Secondary | ICD-10-CM

## 2016-11-23 DIAGNOSIS — L989 Disorder of the skin and subcutaneous tissue, unspecified: Secondary | ICD-10-CM | POA: Diagnosis not present

## 2016-11-23 DIAGNOSIS — I10 Essential (primary) hypertension: Secondary | ICD-10-CM | POA: Diagnosis not present

## 2016-11-23 NOTE — Assessment & Plan Note (Signed)
BP Readings from Last 3 Encounters:  11/23/16 126/88  10/26/16 (!) 160/81  10/18/16 130/80   Stable at goal

## 2016-11-23 NOTE — Assessment & Plan Note (Signed)
Improved off of amlodipine and biotin

## 2016-11-23 NOTE — Patient Instructions (Signed)
Good to see you today!  Thanks for coming in.  All looks healthy.  I would make an appointment for our dermatololgy clinic to have lesion on your abdomen removed  I agree losing weight would be healthier about 1 lb a week

## 2016-11-23 NOTE — Assessment & Plan Note (Signed)
Appears to be a wart or chronic irritated skin tag but given it is bothering her and could be malignant due to chronic irritation recommend removal - refer to derm clinic

## 2016-11-23 NOTE — Progress Notes (Signed)
Subjective  Patient is presenting for PE Feels well  Patient reports no  vision/ hearing changes,anorexia, weight change, fever ,adenopathy, persistant / recurrent hoarseness, swallowing issues, chest pain, ,persistant / recurrent cough, hemoptysis, dyspnea(rest, exertional, paroxysmal nocturnal), gastrointestinal  bleeding (melena, rectal bleeding), abdominal pain, excessive heart burn, GU symptoms(dysuria, hematuria, pyuria, voiding/incontinence  Issues) syncope, focal weakness, severe memory loss, concerning skin lesions, depression, anxiety, abnormal bruising/bleeding, major joint swelling, breast masses or abnormal vaginal bleeding.    Skin lesion on abdomen has been present for years but is irritated by her belt  Chief Complaint noted Review of Symptoms - see HPI PMH - Smoking status noted.     Objective Vital Signs reviewed Neck:  No deformities, thyromegaly, masses, or tenderness noted.   Supple with full range of motion without pain. Lungs:  Normal respiratory effort, chest expands symmetrically. Lungs are clear to auscultation, no crackles or wheezes. Heart - Regular rate and rhythm.  No murmurs, gallops or rubs.    Abdomen: soft and non-tender without masses, organomegaly or hernias noted.  No guarding or rebound Skin - rough irregular warty appearing lesion on her left lower abdomen at the belt level.  7 mm in diameter dark.    Assessments/Plans  No problem-specific Assessment & Plan notes found for this encounter.   See Encounter view if individual problem A/Ps not visible See after visit summary for details of patient instuctions

## 2016-11-24 NOTE — Progress Notes (Signed)
Patient came in today to pick up standard Afo brace.  Patient was evaluated for fit and function.   The brace fit very well and there were any complaints of the way it felt once donned.  The brace offered ankle stability in both saggital and coroneal planes.  Patient advised to always wear proper fitting shoes with brace. 

## 2016-12-06 ENCOUNTER — Other Ambulatory Visit: Payer: Self-pay | Admitting: Family Medicine

## 2016-12-27 ENCOUNTER — Other Ambulatory Visit: Payer: Self-pay | Admitting: Family Medicine

## 2016-12-27 DIAGNOSIS — Z1231 Encounter for screening mammogram for malignant neoplasm of breast: Secondary | ICD-10-CM

## 2017-01-03 ENCOUNTER — Telehealth: Payer: Self-pay | Admitting: *Deleted

## 2017-01-03 NOTE — Telephone Encounter (Signed)
Patient states she has been taking metoprolol for about 2 months now (1/2 tab in the morning and 1/2 tab in the evenings). Patient states since starting this medication she has'nt been able to sleep well and when she does sleep she has very vivid/bad dreams, also feels like her memory has been foggy. Patient says all these symptoms started when she started this medication and she thought these side effects would wear off the longer she took the medication but it has'nt. Patient states pharmacist suggested possibly starting a beta blocker that she could take once daily in the morning may help. Patient states she does not want to continue metoprolol and would like MD to switch her to something else.

## 2017-01-05 ENCOUNTER — Other Ambulatory Visit: Payer: Self-pay | Admitting: Family Medicine

## 2017-01-05 MED ORDER — METOPROLOL SUCCINATE ER 25 MG PO TB24: 25.0000 mg | ORAL_TABLET | Freq: Every morning | ORAL | 3 refills | Status: DC

## 2017-01-05 NOTE — Telephone Encounter (Signed)
Called her stop tartrate start succinate 1/2 pill in am for 3-4 days if tolerating go to full pill Monitor her blood pressure and pulse with her machine

## 2017-01-09 ENCOUNTER — Ambulatory Visit
Admission: RE | Admit: 2017-01-09 | Discharge: 2017-01-09 | Disposition: A | Discharge status: Home / Self Care | Payer: BLUE CROSS/BLUE SHIELD | Source: Ambulatory Visit | Attending: Family Medicine | Admitting: Family Medicine

## 2017-01-09 DIAGNOSIS — Z1231 Encounter for screening mammogram for malignant neoplasm of breast: Secondary | ICD-10-CM | POA: Diagnosis not present

## 2017-01-18 ENCOUNTER — Ambulatory Visit: Payer: BLUE CROSS/BLUE SHIELD

## 2017-01-18 NOTE — Progress Notes (Deleted)
   Subjective:    Patient ID: [name], female    DOB: 07/28/1965, 51 y.o.   MRN: [id number]   CC:  HPI: Skin lesion on abdomen has been present for years but is irritated by her belt.  Seen by Dr. Erin Hearing on 7/25 and recommended for referral to dermatology clinic for removal.   Smoking status reviewed  Review of Systems   Objective:  There were no vitals taken for this visit. Vitals and nursing note reviewed  General: well nourished, in no acute distress HEENT: normocephalic, TM's visualized bilaterally, no scleral icterus or conjunctival pallor, no nasal discharge, moist mucous membranes, good dentition without erythema or discharge noted in posterior oropharynx Neck: supple, non-tender, without lymphadenopathy Cardiac: RRR, clear S1 and S2, no murmurs, rubs, or gallops Respiratory: clear to auscultation bilaterally, no increased work of breathing Abdomen: soft, nontender, nondistended, no masses or organomegaly. Bowel sounds present Extremities: no edema or cyanosis. Warm, well perfused. 2+ radial and PT pulses bilaterally Skin: warm and dry, no rashes noted Neuro: alert and oriented, no focal deficits   Assessment & Plan:    No problem-specific Assessment & Plan notes found for this encounter.    No Follow-up on file.   Caroline More, DO, PGY-1

## 2017-01-23 ENCOUNTER — Other Ambulatory Visit: Payer: Self-pay | Admitting: Family Medicine

## 2017-02-10 ENCOUNTER — Telehealth: Payer: Self-pay | Admitting: *Deleted

## 2017-02-10 DIAGNOSIS — S63641A Sprain of metacarpophalangeal joint of right thumb, initial encounter: Secondary | ICD-10-CM | POA: Diagnosis not present

## 2017-02-10 DIAGNOSIS — M79641 Pain in right hand: Secondary | ICD-10-CM | POA: Diagnosis not present

## 2017-02-10 NOTE — Telephone Encounter (Signed)
Patient left message on nurse line wanting to let PCP know that since starting beta blocker heart rate has been in mid 40s. Today at separate office visit it was 42. Hubbard Hartshorn, RN, BSN

## 2017-02-14 NOTE — Telephone Encounter (Signed)
Please call her and ask her to stop her beta blocker (metoprolol)  metoprolol and call back with her HR and BP later this week  Thanks  LC

## 2017-02-15 NOTE — Telephone Encounter (Signed)
Patient notified. Will call back Friday with BP and HR. Hubbard Hartshorn, RN, BSN

## 2017-02-15 NOTE — Telephone Encounter (Signed)
Attempted to reach patient to give info below. Left message on VM requesting return call. Hubbard Hartshorn, RN, BSN

## 2017-02-20 ENCOUNTER — Telehealth: Payer: Self-pay | Admitting: Family Medicine

## 2017-02-20 ENCOUNTER — Ambulatory Visit (INDEPENDENT_AMBULATORY_CARE_PROVIDER_SITE_OTHER): Payer: BLUE CROSS/BLUE SHIELD | Admitting: *Deleted

## 2017-02-20 VITALS — BP 166/102 | HR 57

## 2017-02-20 DIAGNOSIS — I10 Essential (primary) hypertension: Secondary | ICD-10-CM

## 2017-02-20 DIAGNOSIS — Z0130 Encounter for examination of blood pressure without abnormal findings: Secondary | ICD-10-CM

## 2017-02-20 MED ORDER — INDAPAMIDE 1.25 MG PO TABS: 1.2500 mg | ORAL_TABLET | Freq: Every day | ORAL | 1 refills | Status: DC

## 2017-02-20 NOTE — Progress Notes (Signed)
   Patient presents for BP check after stopping metoprolol 5 days ago Med list reviewed Discussed need for low sodium diet and using Mrs. Dash as alternative to salt Encouraged to choose foods with 5% or less of daily value for sodium. Patient walking 45 minutes 3 days per week; doing 5K walk this weekend  Patient denies blurred vision, SHOB, chest pain  Reports slight headache and nausea earlier but not at present  Vitals:   02/20/17 1542 02/20/17 1554  BP: (!) 166/108 (!) 166/102  Pulse: (!) 57   SpO2: 97%    BP recheck 166/102 left arm manually with large cuff   First available appt given with PCP (03/08/2017)  Patient given literature on DASH Eating Plan  Return precautions discussed. Hubbard Hartshorn, RN, BSN

## 2017-02-20 NOTE — Assessment & Plan Note (Signed)
Unable to tolerate beta blocker due to bradycardia and fatigue.  Amlodipine caused edema, Has angioedema so can't take ARB ACE.  Will retry thiazide - indapamide at low dose and watch her K

## 2017-02-20 NOTE — Telephone Encounter (Signed)
Spoke with her   Will Start indapamide 1.25 mg daily Continue spiro 25 mg Stop metoprolol Send me her blood pressure readings Come in for bmet on Thursday or Friday

## 2017-02-20 NOTE — Patient Instructions (Signed)
DASH Eating Plan DASH stands for "Dietary Approaches to Stop Hypertension." The DASH eating plan is a healthy eating plan that has been shown to reduce high blood pressure (hypertension). It may also reduce your risk for type 2 diabetes, heart disease, and stroke. The DASH eating plan may also help with weight loss. What are tips for following this plan? General guidelines  Avoid eating more than 2,300 mg (milligrams) of salt (sodium) a day. If you have hypertension, you may need to reduce your sodium intake to 1,500 mg a day.  Limit alcohol intake to no more than 1 drink a day for nonpregnant women and 2 drinks a day for men. One drink equals 12 oz of beer, 5 oz of wine, or 1 oz of hard liquor.  Work with your health care provider to maintain a healthy body weight or to lose weight. Ask what an ideal weight is for you.  Get at least 30 minutes of exercise that causes your heart to beat faster (aerobic exercise) most days of the week. Activities may include walking, swimming, or biking.  Work with your health care provider or diet and nutrition specialist (dietitian) to adjust your eating plan to your individual calorie needs. Reading food labels  Check food labels for the amount of sodium per serving. Choose foods with less than 5 percent of the Daily Value of sodium. Generally, foods with less than 300 mg of sodium per serving fit into this eating plan.  To find whole grains, look for the word "whole" as the first word in the ingredient list. Shopping  Buy products labeled as "low-sodium" or "no salt added."  Buy fresh foods. Avoid canned foods and premade or frozen meals. Cooking  Avoid adding salt when cooking. Use salt-free seasonings or herbs instead of table salt or sea salt. Check with your health care provider or pharmacist before using salt substitutes.  Do not fry foods. Cook foods using healthy methods such as baking, boiling, grilling, and broiling instead.  Cook with  heart-healthy oils, such as olive, canola, soybean, or sunflower oil. Meal planning   Eat a balanced diet that includes: ? 5 or more servings of fruits and vegetables each day. At each meal, try to fill half of your plate with fruits and vegetables. ? Up to 6-8 servings of whole grains each day. ? Less than 6 oz of lean meat, poultry, or fish each day. A 3-oz serving of meat is about the same size as a deck of cards. One egg equals 1 oz. ? 2 servings of low-fat dairy each day. ? A serving of nuts, seeds, or beans 5 times each week. ? Heart-healthy fats. Healthy fats called Omega-3 fatty acids are found in foods such as flaxseeds and coldwater fish, like sardines, salmon, and mackerel.  Limit how much you eat of the following: ? Canned or prepackaged foods. ? Food that is high in trans fat, such as fried foods. ? Food that is high in saturated fat, such as fatty meat. ? Sweets, desserts, sugary drinks, and other foods with added sugar. ? Full-fat dairy products.  Do not salt foods before eating.  Try to eat at least 2 vegetarian meals each week.  Eat more home-cooked food and less restaurant, buffet, and fast food.  When eating at a restaurant, ask that your food be prepared with less salt or no salt, if possible. What foods are recommended? The items listed may not be a complete list. Talk with your dietitian about what   dietary choices are best for you. Grains Whole-grain or whole-wheat bread. Whole-grain or whole-wheat pasta. Brown rice. Oatmeal. Quinoa. Bulgur. Whole-grain and low-sodium cereals. Pita bread. Low-fat, low-sodium crackers. Whole-wheat flour tortillas. Vegetables Fresh or frozen vegetables (raw, steamed, roasted, or grilled). Low-sodium or reduced-sodium tomato and vegetable juice. Low-sodium or reduced-sodium tomato sauce and tomato paste. Low-sodium or reduced-sodium canned vegetables. Fruits All fresh, dried, or frozen fruit. Canned fruit in natural juice (without  added sugar). Meat and other protein foods Skinless chicken or turkey. Ground chicken or turkey. Pork with fat trimmed off. Fish and seafood. Egg whites. Dried beans, peas, or lentils. Unsalted nuts, nut butters, and seeds. Unsalted canned beans. Lean cuts of beef with fat trimmed off. Low-sodium, lean deli meat. Dairy Low-fat (1%) or fat-free (skim) milk. Fat-free, low-fat, or reduced-fat cheeses. Nonfat, low-sodium ricotta or cottage cheese. Low-fat or nonfat yogurt. Low-fat, low-sodium cheese. Fats and oils Soft margarine without trans fats. Vegetable oil. Low-fat, reduced-fat, or light mayonnaise and salad dressings (reduced-sodium). Canola, safflower, olive, soybean, and sunflower oils. Avocado. Seasoning and other foods Herbs. Spices. Seasoning mixes without salt. Unsalted popcorn and pretzels. Fat-free sweets. What foods are not recommended? The items listed may not be a complete list. Talk with your dietitian about what dietary choices are best for you. Grains Baked goods made with fat, such as croissants, muffins, or some breads. Dry pasta or rice meal packs. Vegetables Creamed or fried vegetables. Vegetables in a cheese sauce. Regular canned vegetables (not low-sodium or reduced-sodium). Regular canned tomato sauce and paste (not low-sodium or reduced-sodium). Regular tomato and vegetable juice (not low-sodium or reduced-sodium). Pickles. Olives. Fruits Canned fruit in a light or heavy syrup. Fried fruit. Fruit in cream or butter sauce. Meat and other protein foods Fatty cuts of meat. Ribs. Fried meat. Bacon. Sausage. Bologna and other processed lunch meats. Salami. Fatback. Hotdogs. Bratwurst. Salted nuts and seeds. Canned beans with added salt. Canned or smoked fish. Whole eggs or egg yolks. Chicken or turkey with skin. Dairy Whole or 2% milk, cream, and half-and-half. Whole or full-fat cream cheese. Whole-fat or sweetened yogurt. Full-fat cheese. Nondairy creamers. Whipped toppings.  Processed cheese and cheese spreads. Fats and oils Butter. Stick margarine. Lard. Shortening. Ghee. Bacon fat. Tropical oils, such as coconut, palm kernel, or palm oil. Seasoning and other foods Salted popcorn and pretzels. Onion salt, garlic salt, seasoned salt, table salt, and sea salt. Worcestershire sauce. Tartar sauce. Barbecue sauce. Teriyaki sauce. Soy sauce, including reduced-sodium. Steak sauce. Canned and packaged gravies. Fish sauce. Oyster sauce. Cocktail sauce. Horseradish that you find on the shelf. Ketchup. Mustard. Meat flavorings and tenderizers. Bouillon cubes. Hot sauce and Tabasco sauce. Premade or packaged marinades. Premade or packaged taco seasonings. Relishes. Regular salad dressings. Where to find more information:  National Heart, Lung, and Blood Institute: www.nhlbi.nih.gov  American Heart Association: www.heart.org Summary  The DASH eating plan is a healthy eating plan that has been shown to reduce high blood pressure (hypertension). It may also reduce your risk for type 2 diabetes, heart disease, and stroke.  With the DASH eating plan, you should limit salt (sodium) intake to 2,300 mg a day. If you have hypertension, you may need to reduce your sodium intake to 1,500 mg a day.  When on the DASH eating plan, aim to eat more fresh fruits and vegetables, whole grains, lean proteins, low-fat dairy, and heart-healthy fats.  Work with your health care provider or diet and nutrition specialist (dietitian) to adjust your eating plan to your individual   calorie needs. This information is not intended to replace advice given to you by your health care provider. Make sure you discuss any questions you have with your health care provider. Document Released: 04/07/2011 Document Revised: 04/11/2016 Document Reviewed: 04/11/2016 Elsevier Interactive Patient Education  2017 Elsevier Inc.  

## 2017-02-24 ENCOUNTER — Other Ambulatory Visit: Payer: BLUE CROSS/BLUE SHIELD

## 2017-02-24 DIAGNOSIS — I10 Essential (primary) hypertension: Secondary | ICD-10-CM

## 2017-02-25 LAB — BASIC METABOLIC PANEL
BUN / CREAT RATIO: 14 (ref 9–23)
BUN: 11 mg/dL (ref 6–24)
CO2: 24 mmol/L (ref 20–29)
CREATININE: 0.76 mg/dL (ref 0.57–1.00)
Calcium: 9.2 mg/dL (ref 8.7–10.2)
Chloride: 102 mmol/L (ref 96–106)
GFR, EST AFRICAN AMERICAN: 104 mL/min/{1.73_m2} (ref >59)
GFR, EST NON AFRICAN AMERICAN: 90 mL/min/{1.73_m2} (ref >59)
Glucose: 89 mg/dL (ref 65–99)
Potassium: 4 mmol/L (ref 3.5–5.2)
SODIUM: 140 mmol/L (ref 134–144)

## 2017-03-01 ENCOUNTER — Encounter: Payer: Self-pay | Admitting: Family Medicine

## 2017-03-08 ENCOUNTER — Ambulatory Visit: Payer: BLUE CROSS/BLUE SHIELD | Admitting: Family Medicine

## 2017-04-12 ENCOUNTER — Ambulatory Visit: Payer: BLUE CROSS/BLUE SHIELD | Admitting: Family Medicine

## 2017-04-14 ENCOUNTER — Other Ambulatory Visit: Payer: Self-pay | Admitting: Family Medicine

## 2017-05-03 ENCOUNTER — Ambulatory Visit: Payer: BLUE CROSS/BLUE SHIELD | Admitting: Family Medicine

## 2017-05-03 ENCOUNTER — Other Ambulatory Visit: Payer: Self-pay

## 2017-05-03 ENCOUNTER — Encounter: Payer: Self-pay | Admitting: Family Medicine

## 2017-05-03 DIAGNOSIS — E669 Obesity, unspecified: Secondary | ICD-10-CM

## 2017-05-03 DIAGNOSIS — I10 Essential (primary) hypertension: Secondary | ICD-10-CM | POA: Diagnosis not present

## 2017-05-03 NOTE — Assessment & Plan Note (Signed)
Improved - continue to work on this year

## 2017-05-03 NOTE — Progress Notes (Signed)
Subjective  Patient is presenting with the following illnesses  HYPERTENSION Disease Monitoring  Home BP Monitoring (Severity) well controlled Symptoms - Chest pain- no    Dyspnea- no Medications (Modifying factors) Compliance-  Daily two meds. Lightheadedness-  Mild rarely  Edema- no Timing - continuous  Duration - years ROS - See HPI  OBESITY Current weight/BMI : Body mass index is 34.76 kg/m.    How long have been obese:  years Course:  Better since last visit Problems or symptoms it causes:  Pain in legs   Things have tried to improve:  Stop eating out as much   PMH Lab Review   Potassium  Date Value Ref Range Status  02/24/2017 4.0 3.5 - 5.2 mmol/L Final  03/18/2009 3.0 (L) 3.3 - 4.7 mEq/L Final   Sodium  Date Value Ref Range Status  02/24/2017 140 134 - 144 mmol/L Final  03/18/2009 136 128 - 145 mEq/L Final   Creat  Date Value Ref Range Status  03/09/2016 0.77 0.50 - 1.05 mg/dL Final    Comment:      For patients > or = 53 years of age: The upper reference limit for Creatinine is approximately 13% higher for people identified as African-American.      Creatinine, Ser  Date Value Ref Range Status  02/24/2017 0.76 0.57 - 1.00 mg/dL Final           Chief Complaint noted Review of Symptoms - see HPI PMH - Smoking status noted.     Objective Vital Signs reviewed Psych:  Cognition and judgment appear intact. Alert, communicative  and cooperative with normal attention span and concentration. No apparent delusions, illusions, hallucinations    Assessments/Plans  HYPERTENSION, BENIGN SYSTEMIC Improved - continue current medications   Obesity (BMI 30-39.9) Improved - continue to work on this year    See after visit summary for details of patient instuctions

## 2017-05-03 NOTE — Patient Instructions (Signed)
Good to see you today!  Thanks for coming in.  Keep doing what you are doing  Come back in 6 months

## 2017-05-03 NOTE — Assessment & Plan Note (Signed)
Improved continue current medications  

## 2017-05-11 ENCOUNTER — Ambulatory Visit: Payer: BLUE CROSS/BLUE SHIELD | Admitting: Podiatry

## 2017-05-11 ENCOUNTER — Ambulatory Visit (INDEPENDENT_AMBULATORY_CARE_PROVIDER_SITE_OTHER): Payer: BLUE CROSS/BLUE SHIELD

## 2017-05-11 DIAGNOSIS — M7742 Metatarsalgia, left foot: Secondary | ICD-10-CM | POA: Diagnosis not present

## 2017-05-11 DIAGNOSIS — M19172 Post-traumatic osteoarthritis, left ankle and foot: Secondary | ICD-10-CM | POA: Diagnosis not present

## 2017-05-11 DIAGNOSIS — M779 Enthesopathy, unspecified: Secondary | ICD-10-CM

## 2017-05-11 NOTE — Patient Instructions (Signed)
For a second opinion please contact:  Dr. Selene G. Parekh, MD [address]  [place], [place] [address] [phone]  

## 2017-05-12 ENCOUNTER — Telehealth: Payer: Self-pay | Admitting: *Deleted

## 2017-05-12 ENCOUNTER — Other Ambulatory Visit: Payer: Self-pay | Admitting: Family Medicine

## 2017-05-12 DIAGNOSIS — M7740 Metatarsalgia, unspecified foot: Secondary | ICD-10-CM

## 2017-05-12 DIAGNOSIS — M19172 Post-traumatic osteoarthritis, left ankle and foot: Secondary | ICD-10-CM

## 2017-05-12 DIAGNOSIS — M779 Enthesopathy, unspecified: Secondary | ICD-10-CM

## 2017-05-12 NOTE — Progress Notes (Signed)
Subjective: 52 year old female presents the office today for concerns of continued left forefoot pain.  She has been trying the brace and this is not been helping and she states the pain to the forefoot is been getting worse to the point where she is going to have to start walking with a walker.  She also gets pain to the ankle joint.  She denies any recent injury or trauma and she denies any increase in swelling or redness to her ankle. She has minimal ROM of the ankle joint and she is starting to get pain to the back of her knee.   She previously underwent a total ankle replacement in 2012 with Dr. Doran Durand.  She subsequently developed an infection and return to the operating room for incision and drainage and poly exchange.   Denies any systemic complaints such as fevers, chills, nausea, vomiting. No acute changes since last appointment, and no other complaints at this time.   Objective: AAO x3, NAD DP/PT pulses palpable bilaterally, CRT less than 3 seconds Tenderness palpation along the dorsal forefoot there is no specific area pinpoint bony tenderness or pain to vibratory sensation.  There is chronic edema to the ankle joint and there is minimal range of motion of the ankle joint.  I am unable to get her ankle joint to 90 degrees.  When she stands she has a hyperextension knee in order to get her foot flat on the ground.  There is no overlying erythema or increase in warmth.  Mild diffuse tenderness of the ankle joint but there is no erythema or increase in warmth.  There is no clinical signs of infection noted. No open lesions or pre-ulcerative lesions.  No pain with calf compression, swelling, warmth, erythema  Assessment: Capsulitis right foot, plantarflexed foot left side due to ankle arthritis  Plan: -All treatment options discussed with the patient including all alternatives, risks, complications.  -X-rays were obtained and reviewed with the patient.  No evidence of acute fracture  identified today.  No stress fracture identified.  Arthritic changes present to the ankle joint with total ankle replacement present. -At this point I have tried bracing and other conservative treatment and she continues to have forefoot pain.  I believe the reason why she is having forefoot pain is due to ankle arthritis and the position of her ankle.  We discussed conservative and surgical treatment.  At this point I think that she is to be evaluated further surgical intervention.  We discussed removal of hardware, ankle arthrodesis with likely rod, spacer but I want her to see one of the physicians at [facility]. A referral was placed for this. I was going to have her see Dr. Doran Durand but she requested to be seen at [facility]. She has no further questions or concerns today.   Trula Slade DPM

## 2017-05-12 NOTE — Telephone Encounter (Signed)
Dr. Jacqualyn Posey referred pt to Dr. Gardiner Fanti at [facility]. Faxed referral, clinicals and demographics to Dr. Donnie Mesa at [facility].

## 2017-05-12 NOTE — Telephone Encounter (Signed)
Pt states she got the message that her appt for the MRI was ready, and she didn't think she was to have a MRI. I told pt I had left the appt on the wrong phone. I told her I did make the referral to Dr. Donnie Mesa - [facility] and I would give her their phone number. Pt asked that I call back and leave on her voice mail. Left message with Dr. Gardiner Fanti [phone] and if they had not called her by Wednesday to call.

## 2017-05-31 DIAGNOSIS — M25572 Pain in left ankle and joints of left foot: Secondary | ICD-10-CM | POA: Diagnosis not present

## 2017-05-31 DIAGNOSIS — M6702 Short Achilles tendon (acquired), left ankle: Secondary | ICD-10-CM | POA: Diagnosis not present

## 2017-05-31 DIAGNOSIS — Z96662 Presence of left artificial ankle joint: Secondary | ICD-10-CM | POA: Diagnosis not present

## 2017-05-31 DIAGNOSIS — M6720 Synovial hypertrophy, not elsewhere classified, unspecified site: Secondary | ICD-10-CM | POA: Diagnosis not present

## 2017-06-12 DIAGNOSIS — Z96662 Presence of left artificial ankle joint: Secondary | ICD-10-CM | POA: Diagnosis not present

## 2017-06-12 DIAGNOSIS — M25572 Pain in left ankle and joints of left foot: Secondary | ICD-10-CM | POA: Diagnosis not present

## 2017-06-12 DIAGNOSIS — M6702 Short Achilles tendon (acquired), left ankle: Secondary | ICD-10-CM | POA: Diagnosis not present

## 2017-06-21 DIAGNOSIS — Z96662 Presence of left artificial ankle joint: Secondary | ICD-10-CM | POA: Diagnosis not present

## 2017-06-29 ENCOUNTER — Ambulatory Visit: Payer: BLUE CROSS/BLUE SHIELD | Admitting: Allergy & Immunology

## 2017-06-29 ENCOUNTER — Encounter: Payer: Self-pay | Admitting: Allergy & Immunology

## 2017-06-29 VITALS — BP 126/70 | HR 74 | Temp 98.8°F | Resp 17 | Ht 68.0 in | Wt 231.8 lb

## 2017-06-29 DIAGNOSIS — S99912D Unspecified injury of left ankle, subsequent encounter: Secondary | ICD-10-CM

## 2017-06-29 DIAGNOSIS — Z9109 Other allergy status, other than to drugs and biological substances: Secondary | ICD-10-CM

## 2017-06-29 NOTE — Patient Instructions (Addendum)
1. Allergy to metal - Come back on Monday for placement of the metal patches. - We will place them on Monday and you can come back on Wednesday and Friday for readings.  - You should not have to pay a copay for this.  2. Return in about 4 days (around 07/03/2017).   Please inform us of any Emergency Department visits, hospitalizations, or changes in symptoms. Call us before going to the ED for breathing or allergy symptoms since we might be able to fit you in for a sick visit. Feel free to contact us anytime with any questions, problems, or concerns.  It was a pleasure to meet you today!  Websites that have reliable patient information: 1. American Academy of Asthma, Allergy, and Immunology: www.aaaai.org 2. Food Allergy Research and Education (FARE): foodallergy.org 3. Mothers of Asthmatics: http://www.asthmacommunitynetwork.org 4. American College of Allergy, Asthma, and Immunology: www.acaai.org

## 2017-06-29 NOTE — Progress Notes (Signed)
 NEW PATIENT  Date of Service/Encounter:  06/29/17  Referring provider: Chambliss, Marshall L, MD   Assessment:   Concern for allergy to metal  Left ankle injury - requiring repeat surgical intervention  Plan/Recommendations:   1. Allergy to metal - Come back on Monday for placement of the metal patches. - We will place them on Monday and you can come back on Wednesday and Friday for readings.  - You should not have to pay a copay for this.  2. Return in about 4 days (around 07/03/2017).  Subjective:   [name] is a 52 y.o. female presenting today for evaluation of  Chief Complaint  Patient presents with  . Allergy Testing    Metals    [name] has a history of the following: Patient Active Problem List   Diagnosis Date Noted  . Skin lesion 11/23/2016  . Ganglion cyst 08/12/2016  . Edema 08/10/2016  . Angioedema 06/16/2016  . Obesity (BMI 30-39.9) 11/17/2010  . Breast ductal hyperplasia, atypical 10/30/2010  . KIDNEY CYST, ACQUIRED 03/19/2007  . HYPERTENSION, BENIGN SYSTEMIC 06/29/2006    History obtained from: chart review and patient.  [name] was referred by Chambliss, Marshall L, MD.     [name] is a 52 y.o. female presenting for evaluation of a possible metal allergy. She is going to have her left ankle replacement placed again. Her story starts in 2011, when she slipped on black ice. They initially put some plates and screws in. Then these pushed out and the second option was the replacement. She had the replacement performed around 2013. It was OK for 1.5 years and then became infected in March 2014. She underwent a debridement and had clean out. The plastic within the join was replaced. Wound culture was negative. She was on a PICC line for 7 weeks. It seems that she has had a simmering infection since that time, according to the patient. Since she has continued to have problems, there is concern that there is a metal allergy which is why  she is here today. She did have a CT scan recently and they are planning to put in a "cinder block" or they may do another replacement. In any case, her orthopedic surgeon wanted to have metal testing done to make sure that she is not sensitized to the materials.   STAR ankle replacement contains cobalt chromium alloy, titanium, and polyethylene.     Otherwise, there is no history of other atopic diseases, including asthma, drug allergies, food allergies, environmental allergies, stinging insect allergies, or urticaria. There is no significant infectious history. Vaccinations are up to date.    Past Medical History: Patient Active Problem List   Diagnosis Date Noted  . Skin lesion 11/23/2016  . Ganglion cyst 08/12/2016  . Edema 08/10/2016  . Angioedema 06/16/2016  . Obesity (BMI 30-39.9) 11/17/2010  . Breast ductal hyperplasia, atypical 10/30/2010  . KIDNEY CYST, ACQUIRED 03/19/2007  . HYPERTENSION, BENIGN SYSTEMIC 06/29/2006    Medication List:  Allergies as of 06/29/2017      Reactions   Tetracycline Hcl Shortness Of Breath, Swelling, Other (See Comments)   Large purple bruise   Angiotensin Receptor Blockers Swelling   Had episode of angioedema on Losartan    Septra [bactrim] Itching, Swelling, Other (See Comments)   Purple bruise   Sulfamethoxazole-trimethoprim Itching, Swelling, Other (See Comments)   Purple bruise      Medication List        Accurate as of 06/29/17    9:20 PM. Always use your most recent med list.          aspirin EC 81 MG tablet Take 81 mg by mouth daily.   indapamide 1.25 MG tablet Commonly known as:  LOZOL TAKE 1 TABLET BY MOUTH EVERY DAY   spironolactone 25 MG tablet Commonly known as:  ALDACTONE TAKE 1 TABLET BY MOUTH EVERY DAY   vitamin E 400 UNIT capsule Take 400 Units by mouth daily.       Birth History: non-contributory.   Developmental History: non-contributory.   Past Surgical History: Past Surgical History:  Procedure  Laterality Date  . BREAST EXCISIONAL BIOPSY    . BREAST SURGERY  2010   lumpectomy Lt breast  . CARPAL TUNNEL RELEASE Right 08/06/2015   Procedure: RIGHT CARPAL TUNNEL RELEASE;  Surgeon: Leanora Cover, MD;  Location: [place];  Service: Orthopedics;  Laterality: Right;  . DORSAL COMPARTMENT RELEASE Right 08/06/2015   Procedure: RIGHT FIRST RELEASE DORSAL COMPARTMENT (DEQUERVAIN);  Surgeon: Leanora Cover, MD;  Location: [place];  Service: Orthopedics;  Laterality: Right;  . ENDOMETRIAL ABLATION  October 18 2010   at [facility]  . FRACTURE SURGERY  2011   Lt ankle surgeries x 3   . JOINT REPLACEMENT     left ankle 8/12   . LAPAROSCOPIC NEPHRECTOMY  03/09/2011   Procedure: LAPAROSCOPIC NEPHRECTOMY;  Surgeon: Franchot Gallo;  Location: [facility];  Service: Urology;  Laterality: Right;  Laparoscopic Assisted Removal Of Renal Cyst   . OTHER SURGICAL HISTORY     2 hand surgeries on left and shoulder surgery on right shoulder   . TOTAL ANKLE ARTHROPLASTY Left 07/05/2012   Procedure: Removal of total ankle implants,I & D with insertion of  new poly spacer;  Surgeon: Wylene Simmer, MD;  Location: [place];  Service: Orthopedics;  Laterality: Left;  . TUBAL LIGATION  1988     Family History: Family History  Problem Relation Age of Onset  . Diabetes Mother   . Heart disease Mother   . Hypertension Father   . Diabetes Sister   . Kidney disease Sister   . Hypertension Brother   . Kidney disease Brother   . Arthritis Brother   . Cancer Maternal Grandmother   . Diabetes Sister   . Hypertension Sister   . Hypertension Sister   . Hypertension Sister   . Hypertension Brother   . Mental illness Brother   . BRCA 1/2 Brother   . Colon cancer Neg Hx      Social History: [name] lives at home with her family. They live in an apartment with vinyl flooring in the main living areas and carpeting in the bedrooms. They have electric heating and central cooling. There are no animals inside  or outside of the home. There are dust mite coverings on the bedding, but not the pillows. There is no tobacco exposure. 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 She is not a smoker at this time.      Review of Systems: a 14-point review of systems is pertinent for what is mentioned in HPI.  Otherwise, all other systems were negative. Constitutional: negative other than that listed in the HPI Eyes: negative other than that listed in the HPI Ears, nose, mouth, throat, and face: negative other than that listed in the HPI Respiratory: negative other than that listed in the HPI Cardiovascular: negative other than that listed in the HPI Gastrointestinal: negative other than that  listed in the HPI Genitourinary: negative other than that listed in the HPI Integument: negative other than that listed in the HPI Hematologic: negative other than that listed in the HPI Musculoskeletal: negative other than that listed in the HPI Neurological: negative other than that listed in the HPI Allergy/Immunologic: negative other than that listed in the HPI    Objective:   Blood pressure 126/70, pulse 74, temperature 98.8 F (37.1 C), resp. rate 17, height 5' 8" (1.727 m), weight 231 lb 12.8 oz (105.1 kg), SpO2 97 %. Body mass index is 35.25 kg/m.   Physical Exam:  General: Alert, interactive, in no acute distress. Pleasant.  Eyes: No conjunctival injection bilaterally, no discharge on the right, no discharge on the left and no Horner-Trantas dots present. PERRL bilaterally. EOMI without pain. No photophobia.  Ears: Right TM pearly gray with normal light reflex, Left TM pearly gray with normal light reflex, Right TM intact without perforation and Left TM intact without perforation.  Nose/Throat: External nose within normal limits and septum midline. Turbinates edematous without discharge. Posterior oropharynx mildly erythematous without cobblestoning in the posterior oropharynx.  Tonsils 2+ without exudates.  Tongue without thrush. Neck: Supple without thyromegaly. Trachea midline. Adenopathy: no enlarged lymph nodes appreciated in the anterior cervical, occipital, axillary, epitrochlear, inguinal, or popliteal regions. Lungs: Clear to auscultation without wheezing, rhonchi or rales. No increased work of breathing. CV: Normal S1/S2. No murmurs. Capillary refill <2 seconds.  Abdomen: Nondistended, nontender. No guarding or rebound tenderness. Bowel sounds present in all fields and hypoactive  Skin: There is some redness over the left ankle in conjunction with some marked swelling. There are suture scars appreciated in two locations on the left ankle. Extremities:  No clubbing, cyanosis or edema. Neuro:   Grossly intact. No focal deficits appreciated. Responsive to questions.  Diagnostic studies: none (patches will be placed on Monday 07/03/17      Joel Gallagher, MD [facility]      

## 2017-07-03 ENCOUNTER — Ambulatory Visit (INDEPENDENT_AMBULATORY_CARE_PROVIDER_SITE_OTHER): Payer: BLUE CROSS/BLUE SHIELD | Admitting: Allergy

## 2017-07-03 VITALS — BP 128/78 | HR 62 | Resp 16

## 2017-07-03 DIAGNOSIS — Z9109 Other allergy status, other than to drugs and biological substances: Secondary | ICD-10-CM | POA: Diagnosis not present

## 2017-07-04 NOTE — Progress Notes (Signed)
    Follow-up Note  RE: [name] MRN: [id number] DOB: Sep 30, 1965 Date of Office Visit: 07/03/2017  Primary care provider: Lind Covert, MD Referring provider: Lew Dawes returns to the office today for metal patch test placement, given suspected history of metal allergy.  She was last seen in the office on 06/29/17 by Dr. Ernst Bowler.  She states she has been doing well since this visit.  States she believes she has a "minor infection" in the foot with the implant as to reason she is having metal patch testing done.      Diagnostics:  Metal patch placed on back, marked and taped in place.    Plan:  Metal allergy  - she will return in 2 days for initial reading and again in 4 days for final reading.  Advised not to get patches wet.  She may use antihistamine if any pruritus.    F/u in 2 days  Prudy Feeler, MD [facility]

## 2017-07-05 ENCOUNTER — Ambulatory Visit: Payer: BLUE CROSS/BLUE SHIELD | Admitting: Allergy & Immunology

## 2017-07-05 ENCOUNTER — Encounter: Payer: Self-pay | Admitting: Allergy & Immunology

## 2017-07-05 DIAGNOSIS — Z9109 Other allergy status, other than to drugs and biological substances: Secondary | ICD-10-CM

## 2017-07-05 NOTE — Progress Notes (Signed)
    Follow-up Note  RE: SHAQUANTA HARKLESS MRN: [id number] DOB: 11-08-1965 Date of Office Visit: 07/05/2017  Primary care provider: Lind Covert, MD Referring provider: Lew Dawes returns to the office today for the initial patch test interpretation, given suspected history of metal contact dermatitis. She is going to have surgery for an ankle replacement performed in the near future.    Diagnostics:   Metal series: minimal reactivity to nickel sulfate with one isolated pustule, otherwise negative.   Plan:   Allergic contact dermatitis - The patient will come back on Friday March 8th for a final read. - Her orthopedic appointment has been rescheduled for March 18th.    Salvatore Marvel, MD [facility]

## 2017-07-06 ENCOUNTER — Ambulatory Visit: Payer: BLUE CROSS/BLUE SHIELD | Admitting: Allergy

## 2017-07-07 ENCOUNTER — Ambulatory Visit: Payer: BLUE CROSS/BLUE SHIELD | Admitting: Allergy

## 2017-07-07 DIAGNOSIS — Z9109 Other allergy status, other than to drugs and biological substances: Secondary | ICD-10-CM

## 2017-07-07 NOTE — Progress Notes (Signed)
    Follow-up Note  RE: [name] MRN: [id number] DOB: [birth year] Date of Office Visit: 07/07/2017  Primary care provider: Lind Covert, MD Referring provider: Lew Dawes returns to the office today for the initial patch test interpretation, given suspected history of contact dermatitis.    Diagnostics:  Metal series 96 hour reading:  3 erythematous papules at location of nickel sulfate hexahydrate   Metal series 48hr reading: minimal reactivity to nickel sulfate with one isolated pustule, otherwise negative.   Plan:  Metal allergy  The patient has been provided detailed information regarding the Nickel that she is sensitive to, as well as products containing the substances.  Meticulous avoidance of Nickel containing substances is recommended. If avoidance is not possible, the use of barrier creams or lotions is recommended.  Prudy Feeler, MD [facility]

## 2017-07-17 DIAGNOSIS — Z96662 Presence of left artificial ankle joint: Secondary | ICD-10-CM | POA: Diagnosis not present

## 2017-07-17 DIAGNOSIS — M6702 Short Achilles tendon (acquired), left ankle: Secondary | ICD-10-CM | POA: Diagnosis not present

## 2017-08-07 DIAGNOSIS — Z96662 Presence of left artificial ankle joint: Secondary | ICD-10-CM | POA: Diagnosis not present

## 2017-08-07 DIAGNOSIS — M6702 Short Achilles tendon (acquired), left ankle: Secondary | ICD-10-CM | POA: Diagnosis not present

## 2017-08-22 DIAGNOSIS — Z01818 Encounter for other preprocedural examination: Secondary | ICD-10-CM | POA: Diagnosis not present

## 2017-08-22 DIAGNOSIS — Z91048 Other nonmedicinal substance allergy status: Secondary | ICD-10-CM | POA: Diagnosis not present

## 2017-08-22 DIAGNOSIS — T8459XA Infection and inflammatory reaction due to other internal joint prosthesis, initial encounter: Secondary | ICD-10-CM | POA: Diagnosis not present

## 2017-08-22 DIAGNOSIS — I10 Essential (primary) hypertension: Secondary | ICD-10-CM | POA: Diagnosis not present

## 2017-08-22 DIAGNOSIS — R001 Bradycardia, unspecified: Secondary | ICD-10-CM | POA: Diagnosis not present

## 2017-08-22 DIAGNOSIS — I358 Other nonrheumatic aortic valve disorders: Secondary | ICD-10-CM | POA: Diagnosis not present

## 2017-08-22 DIAGNOSIS — T8484XA Pain due to internal orthopedic prosthetic devices, implants and grafts, initial encounter: Secondary | ICD-10-CM | POA: Diagnosis not present

## 2017-08-22 DIAGNOSIS — M25572 Pain in left ankle and joints of left foot: Secondary | ICD-10-CM | POA: Diagnosis not present

## 2017-08-22 DIAGNOSIS — G8929 Other chronic pain: Secondary | ICD-10-CM | POA: Diagnosis not present

## 2017-08-22 DIAGNOSIS — Z96662 Presence of left artificial ankle joint: Secondary | ICD-10-CM | POA: Diagnosis not present

## 2017-08-22 DIAGNOSIS — G8918 Other acute postprocedural pain: Secondary | ICD-10-CM | POA: Diagnosis not present

## 2017-08-22 DIAGNOSIS — Z472 Encounter for removal of internal fixation device: Secondary | ICD-10-CM | POA: Diagnosis not present

## 2017-08-22 DIAGNOSIS — E876 Hypokalemia: Secondary | ICD-10-CM | POA: Diagnosis not present

## 2017-08-22 DIAGNOSIS — Z881 Allergy status to other antibiotic agents status: Secondary | ICD-10-CM | POA: Diagnosis not present

## 2017-08-22 DIAGNOSIS — M6702 Short Achilles tendon (acquired), left ankle: Secondary | ICD-10-CM | POA: Diagnosis not present

## 2017-08-22 DIAGNOSIS — Z7982 Long term (current) use of aspirin: Secondary | ICD-10-CM | POA: Diagnosis not present

## 2017-08-22 DIAGNOSIS — Y791 Therapeutic (nonsurgical) and rehabilitative orthopedic devices associated with adverse incidents: Secondary | ICD-10-CM | POA: Diagnosis not present

## 2017-08-23 DIAGNOSIS — G8929 Other chronic pain: Secondary | ICD-10-CM | POA: Diagnosis not present

## 2017-08-23 DIAGNOSIS — M6702 Short Achilles tendon (acquired), left ankle: Secondary | ICD-10-CM | POA: Diagnosis not present

## 2017-08-23 DIAGNOSIS — M25572 Pain in left ankle and joints of left foot: Secondary | ICD-10-CM | POA: Diagnosis not present

## 2017-08-24 DIAGNOSIS — R001 Bradycardia, unspecified: Secondary | ICD-10-CM | POA: Diagnosis not present

## 2017-08-24 DIAGNOSIS — M25572 Pain in left ankle and joints of left foot: Secondary | ICD-10-CM | POA: Diagnosis not present

## 2017-08-24 DIAGNOSIS — I358 Other nonrheumatic aortic valve disorders: Secondary | ICD-10-CM | POA: Diagnosis not present

## 2017-08-24 DIAGNOSIS — M6702 Short Achilles tendon (acquired), left ankle: Secondary | ICD-10-CM | POA: Diagnosis not present

## 2017-08-24 DIAGNOSIS — G8929 Other chronic pain: Secondary | ICD-10-CM | POA: Diagnosis not present

## 2017-08-25 DIAGNOSIS — G8929 Other chronic pain: Secondary | ICD-10-CM | POA: Diagnosis not present

## 2017-08-25 DIAGNOSIS — M25572 Pain in left ankle and joints of left foot: Secondary | ICD-10-CM | POA: Diagnosis not present

## 2017-08-26 MED ORDER — PHENOL 1.4 % MT LIQD
OROMUCOSAL | Status: DC
Start: ? — End: 2017-08-26

## 2017-08-26 MED ORDER — OXYCODONE HCL 5 MG PO TABS
5.00 | ORAL_TABLET | ORAL | Status: DC
Start: ? — End: 2017-08-26

## 2017-08-26 MED ORDER — VITAMIN C 500 MG PO TABS
1000.00 | ORAL_TABLET | ORAL | Status: DC
Start: 2017-08-26 — End: 2017-08-26

## 2017-08-26 MED ORDER — BISACODYL 10 MG RE SUPP
10.00 | RECTAL | Status: DC
Start: ? — End: 2017-08-26

## 2017-08-26 MED ORDER — ACETAMINOPHEN 325 MG PO TABS
650.00 | ORAL_TABLET | ORAL | Status: DC
Start: 2017-08-26 — End: 2017-08-26

## 2017-08-26 MED ORDER — SODIUM CHLORIDE 0.9 % IJ SOLN
5.00 | INTRAMUSCULAR | Status: DC
Start: ? — End: 2017-08-26

## 2017-08-26 MED ORDER — GENERIC EXTERNAL MEDICATION
Status: DC
Start: ? — End: 2017-08-26

## 2017-08-26 MED ORDER — POLYETHYLENE GLYCOL 3350 17 G PO PACK
17.00 | PACK | ORAL | Status: DC
Start: 2017-08-27 — End: 2017-08-26

## 2017-08-26 MED ORDER — CHOLECALCIFEROL 25 MCG (1000 UT) PO TABS
5000.00 | ORAL_TABLET | ORAL | Status: DC
Start: 2017-08-27 — End: 2017-08-26

## 2017-08-26 MED ORDER — DIPHENHYDRAMINE HCL 25 MG PO CAPS
25.00 | ORAL_CAPSULE | ORAL | Status: DC
Start: ? — End: 2017-08-26

## 2017-08-26 MED ORDER — SPIRONOLACTONE 25 MG PO TABS
25.00 | ORAL_TABLET | ORAL | Status: DC
Start: 2017-08-27 — End: 2017-08-26

## 2017-08-26 MED ORDER — HYDROMORPHONE HCL 1 MG/ML IJ SOLN
0.50 | INTRAMUSCULAR | Status: DC
Start: ? — End: 2017-08-26

## 2017-08-26 MED ORDER — SENNOSIDES-DOCUSATE SODIUM 8.6-50 MG PO TABS
1.00 | ORAL_TABLET | ORAL | Status: DC
Start: 2017-08-26 — End: 2017-08-26

## 2017-08-26 MED ORDER — LIDOCAINE HCL (PF) 1 % IJ SOLN
.50 | INTRAMUSCULAR | Status: DC
Start: ? — End: 2017-08-26

## 2017-08-26 MED ORDER — GABAPENTIN 300 MG PO CAPS
300.00 | ORAL_CAPSULE | ORAL | Status: DC
Start: 2017-08-26 — End: 2017-08-26

## 2017-08-26 MED ORDER — ASPIRIN EC 325 MG PO TBEC
325.00 | DELAYED_RELEASE_TABLET | ORAL | Status: DC
Start: 2017-08-26 — End: 2017-08-26

## 2017-08-27 DIAGNOSIS — M19072 Primary osteoarthritis, left ankle and foot: Secondary | ICD-10-CM | POA: Diagnosis not present

## 2017-09-05 DIAGNOSIS — M6702 Short Achilles tendon (acquired), left ankle: Secondary | ICD-10-CM | POA: Diagnosis not present

## 2017-09-05 DIAGNOSIS — M19072 Primary osteoarthritis, left ankle and foot: Secondary | ICD-10-CM | POA: Diagnosis not present

## 2017-09-05 DIAGNOSIS — Z96662 Presence of left artificial ankle joint: Secondary | ICD-10-CM | POA: Diagnosis not present

## 2017-09-27 DIAGNOSIS — Z96662 Presence of left artificial ankle joint: Secondary | ICD-10-CM | POA: Diagnosis not present

## 2017-09-27 DIAGNOSIS — M6702 Short Achilles tendon (acquired), left ankle: Secondary | ICD-10-CM | POA: Diagnosis not present

## 2017-09-27 DIAGNOSIS — M19072 Primary osteoarthritis, left ankle and foot: Secondary | ICD-10-CM | POA: Diagnosis not present

## 2017-10-03 DIAGNOSIS — Z96662 Presence of left artificial ankle joint: Secondary | ICD-10-CM | POA: Diagnosis not present

## 2017-10-03 DIAGNOSIS — T84039D Mechanical loosening of unspecified internal prosthetic joint, subsequent encounter: Secondary | ICD-10-CM | POA: Diagnosis not present

## 2017-10-03 DIAGNOSIS — T84038D Mechanical loosening of other internal prosthetic joint, subsequent encounter: Secondary | ICD-10-CM | POA: Diagnosis not present

## 2017-10-03 DIAGNOSIS — M6702 Short Achilles tendon (acquired), left ankle: Secondary | ICD-10-CM | POA: Diagnosis not present

## 2017-10-03 DIAGNOSIS — M19072 Primary osteoarthritis, left ankle and foot: Secondary | ICD-10-CM | POA: Diagnosis not present

## 2017-10-03 DIAGNOSIS — Y792 Prosthetic and other implants, materials and accessory orthopedic devices associated with adverse incidents: Secondary | ICD-10-CM | POA: Diagnosis not present

## 2017-10-06 ENCOUNTER — Other Ambulatory Visit: Payer: Self-pay | Admitting: Family Medicine

## 2017-10-17 DIAGNOSIS — T84039D Mechanical loosening of unspecified internal prosthetic joint, subsequent encounter: Secondary | ICD-10-CM | POA: Diagnosis not present

## 2017-10-31 DIAGNOSIS — Z96662 Presence of left artificial ankle joint: Secondary | ICD-10-CM | POA: Diagnosis not present

## 2017-10-31 DIAGNOSIS — M6702 Short Achilles tendon (acquired), left ankle: Secondary | ICD-10-CM | POA: Diagnosis not present

## 2017-10-31 DIAGNOSIS — M19072 Primary osteoarthritis, left ankle and foot: Secondary | ICD-10-CM | POA: Diagnosis not present

## 2017-10-31 DIAGNOSIS — M7989 Other specified soft tissue disorders: Secondary | ICD-10-CM | POA: Diagnosis not present

## 2017-11-07 DIAGNOSIS — T84039D Mechanical loosening of unspecified internal prosthetic joint, subsequent encounter: Secondary | ICD-10-CM | POA: Diagnosis not present

## 2017-11-07 DIAGNOSIS — M19072 Primary osteoarthritis, left ankle and foot: Secondary | ICD-10-CM | POA: Diagnosis not present

## 2017-11-07 DIAGNOSIS — Z96662 Presence of left artificial ankle joint: Secondary | ICD-10-CM | POA: Diagnosis not present

## 2017-11-07 DIAGNOSIS — M6702 Short Achilles tendon (acquired), left ankle: Secondary | ICD-10-CM | POA: Diagnosis not present

## 2017-11-15 DIAGNOSIS — T84039D Mechanical loosening of unspecified internal prosthetic joint, subsequent encounter: Secondary | ICD-10-CM | POA: Diagnosis not present

## 2017-11-15 DIAGNOSIS — M19072 Primary osteoarthritis, left ankle and foot: Secondary | ICD-10-CM | POA: Diagnosis not present

## 2017-11-20 NOTE — Telephone Encounter (Signed)
Called to tell her diabetic shoes not covered.

## 2017-11-28 ENCOUNTER — Encounter: Payer: BLUE CROSS/BLUE SHIELD | Admitting: Family Medicine

## 2017-11-28 DIAGNOSIS — M6702 Short Achilles tendon (acquired), left ankle: Secondary | ICD-10-CM | POA: Diagnosis not present

## 2017-11-28 DIAGNOSIS — M19072 Primary osteoarthritis, left ankle and foot: Secondary | ICD-10-CM | POA: Diagnosis not present

## 2017-11-28 DIAGNOSIS — T84039D Mechanical loosening of unspecified internal prosthetic joint, subsequent encounter: Secondary | ICD-10-CM | POA: Diagnosis not present

## 2017-11-28 DIAGNOSIS — Z96662 Presence of left artificial ankle joint: Secondary | ICD-10-CM | POA: Diagnosis not present

## 2017-11-29 ENCOUNTER — Other Ambulatory Visit: Payer: Self-pay

## 2017-11-29 ENCOUNTER — Ambulatory Visit (INDEPENDENT_AMBULATORY_CARE_PROVIDER_SITE_OTHER): Payer: BLUE CROSS/BLUE SHIELD | Admitting: Family Medicine

## 2017-11-29 ENCOUNTER — Encounter: Payer: Self-pay | Admitting: Family Medicine

## 2017-11-29 DIAGNOSIS — I10 Essential (primary) hypertension: Secondary | ICD-10-CM

## 2017-11-29 DIAGNOSIS — Z9109 Other allergy status, other than to drugs and biological substances: Secondary | ICD-10-CM | POA: Diagnosis not present

## 2017-11-29 NOTE — Assessment & Plan Note (Signed)
Hopefully this will improve now the hardware was removed and eventually she can resume eating greens

## 2017-11-29 NOTE — Patient Instructions (Signed)
Good to see you today!  Thanks for coming in.  Glad the leg is getting better and your found out about the allergy  Keep taking the blood pressure medications as you are  Come back when healed up for a pap smear

## 2017-11-29 NOTE — Progress Notes (Signed)
Subjective  [name] is a 52 y.o. female is presenting with the following  HYPERTENSION Disease Monitoring  Home BP Monitoring (Severity) does not check specifically but has been controlled when she had her surgeries Symptoms - Chest pain- no    Dyspnea- no Medications (Modifying factors) Compliance-  daily. Lightheadedness-  no  Edema- no Timing - continuous  Duration - years ROS - See HPI  NICKEL ALLERGY Found to be having reactions to nickel hardware used in her ankle reconstruction.  Having pain and inflamation around ankle.  Also has hives and itching when eats certain green leafy veggies which resolved when she stopped.  All nickel removed from her ankle   PMH Lab Review   Potassium  Date Value Ref Range Status  02/24/2017 4.0 3.5 - 5.2 mmol/L Final  03/18/2009 3.0 (L) 3.3 - 4.7 mEq/L Final   Sodium  Date Value Ref Range Status  02/24/2017 140 134 - 144 mmol/L Final  03/18/2009 136 128 - 145 mEq/L Final   Creat  Date Value Ref Range Status  03/09/2016 0.77 0.50 - 1.05 mg/dL Final    Comment:      For patients > or = 53 years of age: The upper reference limit for Creatinine is approximately 13% higher for people identified as African-American.      Creatinine, Ser  Date Value Ref Range Status  02/24/2017 0.76 0.57 - 1.00 mg/dL Final          Chief Complaint noted Review of Symptoms - see HPI PMH - Smoking status noted.    Objective Vital Signs reviewed BP 136/86   Pulse 90   Temp 99.1 F (37.3 C) (Oral)   Ht 5\' 8"  (1.727 m)   Wt 224 lb (101.6 kg)   SpO2 97%   BMI 34.06 kg/m  Psych:  Cognition and judgment appear intact. Alert, communicative  and cooperative with normal attention span and concentration. No apparent delusions, illusions, hallucinations  Assessments/Plans  See after visit summary for details of patient instuctions  HYPERTENSION, BENIGN SYSTEMIC BP Readings from Last 3 Encounters:  11/29/17 136/86  07/03/17 128/78  06/29/17  126/70   At goal continue current medications  Nickel allergy Hopefully this will improve now the hardware was removed and eventually she can resume eating greens

## 2017-11-29 NOTE — Assessment & Plan Note (Signed)
BP Readings from Last 3 Encounters:  11/29/17 136/86  07/03/17 128/78  06/29/17 126/70   At goal continue current medications

## 2017-12-01 ENCOUNTER — Telehealth: Payer: Self-pay | Admitting: Family Medicine

## 2017-12-01 NOTE — Telephone Encounter (Signed)
[facility] [employer] form dropped off for at front desk for completion.  Verified that patient section of form has been completed.  Last DOS/WCC with PCP was 11/29/17 .  Placed form in team folder to be completed by clinical staff.  Crista Luria

## 2017-12-04 NOTE — Telephone Encounter (Signed)
Placed in MDs box. Deseree Blount, CMA  

## 2017-12-05 NOTE — Telephone Encounter (Signed)
Pt informed that form is ready for pickup at the front desk.  Copy placed in batch scanning. Fleeger, Salome Spotted, CMA

## 2017-12-12 DIAGNOSIS — T84039D Mechanical loosening of unspecified internal prosthetic joint, subsequent encounter: Secondary | ICD-10-CM | POA: Diagnosis not present

## 2017-12-12 DIAGNOSIS — M6702 Short Achilles tendon (acquired), left ankle: Secondary | ICD-10-CM | POA: Diagnosis not present

## 2017-12-12 DIAGNOSIS — M19072 Primary osteoarthritis, left ankle and foot: Secondary | ICD-10-CM | POA: Diagnosis not present

## 2017-12-12 DIAGNOSIS — Z96662 Presence of left artificial ankle joint: Secondary | ICD-10-CM | POA: Diagnosis not present

## 2018-01-03 ENCOUNTER — Other Ambulatory Visit: Payer: Self-pay | Admitting: Family Medicine

## 2018-01-03 DIAGNOSIS — Z1231 Encounter for screening mammogram for malignant neoplasm of breast: Secondary | ICD-10-CM

## 2018-01-09 DIAGNOSIS — T84039D Mechanical loosening of unspecified internal prosthetic joint, subsequent encounter: Secondary | ICD-10-CM | POA: Diagnosis not present

## 2018-01-09 DIAGNOSIS — M19072 Primary osteoarthritis, left ankle and foot: Secondary | ICD-10-CM | POA: Diagnosis not present

## 2018-01-09 DIAGNOSIS — Z96662 Presence of left artificial ankle joint: Secondary | ICD-10-CM | POA: Diagnosis not present

## 2018-01-09 DIAGNOSIS — M6702 Short Achilles tendon (acquired), left ankle: Secondary | ICD-10-CM | POA: Diagnosis not present

## 2018-01-21 ENCOUNTER — Other Ambulatory Visit: Payer: Self-pay | Admitting: Family Medicine

## 2018-02-01 ENCOUNTER — Ambulatory Visit
Admission: RE | Admit: 2018-02-01 | Discharge: 2018-02-01 | Disposition: A | Discharge status: Home / Self Care | Payer: BLUE CROSS/BLUE SHIELD | Source: Ambulatory Visit | Attending: Family Medicine | Admitting: Family Medicine

## 2018-02-01 DIAGNOSIS — Z1231 Encounter for screening mammogram for malignant neoplasm of breast: Secondary | ICD-10-CM

## 2018-02-19 DIAGNOSIS — T84039D Mechanical loosening of unspecified internal prosthetic joint, subsequent encounter: Secondary | ICD-10-CM | POA: Diagnosis not present

## 2018-02-19 DIAGNOSIS — M6702 Short Achilles tendon (acquired), left ankle: Secondary | ICD-10-CM | POA: Diagnosis not present

## 2018-02-19 DIAGNOSIS — M19072 Primary osteoarthritis, left ankle and foot: Secondary | ICD-10-CM | POA: Diagnosis not present

## 2018-02-19 DIAGNOSIS — Z96662 Presence of left artificial ankle joint: Secondary | ICD-10-CM | POA: Diagnosis not present

## 2018-03-28 ENCOUNTER — Other Ambulatory Visit: Payer: Self-pay | Admitting: Family Medicine

## 2018-04-09 DIAGNOSIS — Z96662 Presence of left artificial ankle joint: Secondary | ICD-10-CM | POA: Diagnosis not present

## 2018-04-09 DIAGNOSIS — T84039D Mechanical loosening of unspecified internal prosthetic joint, subsequent encounter: Secondary | ICD-10-CM | POA: Diagnosis not present

## 2018-04-09 DIAGNOSIS — M6702 Short Achilles tendon (acquired), left ankle: Secondary | ICD-10-CM | POA: Diagnosis not present

## 2018-04-09 DIAGNOSIS — M19072 Primary osteoarthritis, left ankle and foot: Secondary | ICD-10-CM | POA: Diagnosis not present

## 2018-04-18 DIAGNOSIS — M25472 Effusion, left ankle: Secondary | ICD-10-CM | POA: Diagnosis not present

## 2018-04-18 DIAGNOSIS — M79672 Pain in left foot: Secondary | ICD-10-CM | POA: Diagnosis not present

## 2018-04-18 DIAGNOSIS — M25475 Effusion, left foot: Secondary | ICD-10-CM | POA: Diagnosis not present

## 2018-04-18 DIAGNOSIS — M79675 Pain in left toe(s): Secondary | ICD-10-CM | POA: Diagnosis not present

## 2018-04-20 DIAGNOSIS — M79672 Pain in left foot: Secondary | ICD-10-CM | POA: Diagnosis not present

## 2018-04-20 DIAGNOSIS — M25472 Effusion, left ankle: Secondary | ICD-10-CM | POA: Diagnosis not present

## 2018-04-20 DIAGNOSIS — M79675 Pain in left toe(s): Secondary | ICD-10-CM | POA: Diagnosis not present

## 2018-04-20 DIAGNOSIS — M25475 Effusion, left foot: Secondary | ICD-10-CM | POA: Diagnosis not present

## 2018-04-23 DIAGNOSIS — M79675 Pain in left toe(s): Secondary | ICD-10-CM | POA: Diagnosis not present

## 2018-04-23 DIAGNOSIS — M25472 Effusion, left ankle: Secondary | ICD-10-CM | POA: Diagnosis not present

## 2018-04-23 DIAGNOSIS — M25475 Effusion, left foot: Secondary | ICD-10-CM | POA: Diagnosis not present

## 2018-04-23 DIAGNOSIS — M79672 Pain in left foot: Secondary | ICD-10-CM | POA: Diagnosis not present

## 2018-04-26 ENCOUNTER — Ambulatory Visit (INDEPENDENT_AMBULATORY_CARE_PROVIDER_SITE_OTHER): Payer: BLUE CROSS/BLUE SHIELD | Admitting: Family Medicine

## 2018-04-26 ENCOUNTER — Other Ambulatory Visit: Payer: Self-pay

## 2018-04-26 VITALS — BP 138/82 | HR 54 | Temp 98.1°F | Ht 68.0 in | Wt 223.2 lb

## 2018-04-26 DIAGNOSIS — M79675 Pain in left toe(s): Secondary | ICD-10-CM | POA: Diagnosis not present

## 2018-04-26 DIAGNOSIS — M79672 Pain in left foot: Secondary | ICD-10-CM | POA: Diagnosis not present

## 2018-04-26 DIAGNOSIS — M25475 Effusion, left foot: Secondary | ICD-10-CM | POA: Diagnosis not present

## 2018-04-26 DIAGNOSIS — R42 Dizziness and giddiness: Secondary | ICD-10-CM | POA: Diagnosis not present

## 2018-04-26 DIAGNOSIS — M25472 Effusion, left ankle: Secondary | ICD-10-CM | POA: Diagnosis not present

## 2018-04-26 NOTE — Patient Instructions (Signed)
It was a pleasure to see you today! Thank you for choosing [facility] for your primary care. [name] was seen for dizziness.   Our plans for today were:  I will call you with the results.   Go to the ED if this worsens.    Best,  Dr. Lindell Noe

## 2018-04-26 NOTE — Progress Notes (Signed)
   CC: dizzy spell   HPI  Dizzy spells - since thanksgiving. Daily thing, but not constant. Happens both with bending down to get things but also while just lying in bed. At the onset, was lasting for many hours per day and requiring her to lie down. Also feels nauseous with the dizziness. Thought she was dehydrated, so tried to add extra water. The last time she felt this way was the last time she had hypokalemia (~2012). She takes spiro for this now, no changes to meds recently. Reports she does have a hx of bradycardia, saw cardiologist in the past and was told she had a small valve leakage and was supposed to recheck every 2 years but hasn't recently. Sometimes dizzy after eating, and gets nauseous at this time as well. Feels really tired after doing PT for her ankle. Thyroid issues run in her family. Had an endometrial ablation in the past, no vaginal bleeding. Now the dizzy spells are less frequent when she's not active. She said she has been purposely decreasing her activity over the last 2 weeks to try to avoid dizziness.   ROS: Denies CP, SOB, abdominal pain, dysuria, changes in BMs.   CC, SH/smoking status, and VS noted  Objective: BP 138/82   Pulse (!) 54   Temp 98.1 F (36.7 C) (Oral)   Ht 5\' 8"  (1.727 m)   Wt 223 lb 3.2 oz (101.2 kg)   SpO2 99%   BMI 33.94 kg/m  Gen: NAD, alert, cooperative, and pleasant. HEENT: NCAT, EOMI, PERRL CV: RRR, no murmur Resp: CTAB, no wheezes, non-labored Abd: SNTND, BS present, no guarding or organomegaly Ext: No edema, warm Neuro: Alert and oriented, Speech clear, No gross deficits. FNF normal, CN II-XII grossly intact.   Assessment and plan:  Dizziness: Broad differential.  Patient is most concerned with possible recurrent hypokalemia which is reasonable.  We will order a BMP for this. Orthostatic vital signs negative.  I am also somewhat concerned with her bradycardia and whether this may be exacerbating her dizzy symptoms.  She states that  her bradycardia has been evaluated in the past.  Will check CBC for anemia, TSH for any thyroid dysfunction.  If labs are completely normal, it may be reasonable to trial meclizine.  I also asked her to go ahead and schedule a follow-up appointment with Dr. Erin Hearing in the next week or 2 in case her labs do not reveal a cause for her dizziness.  Given history of endometrial ablation, it will not be easily apparent when the patient goes through menopause.  This could be some atypical vasomotor symptoms as well.  Orders Placed This Encounter  Procedures  . CBC  . Basic metabolic panel  . TSH     Ralene Ok, MD, PGY3 04/26/2018 3:31 PM

## 2018-04-27 ENCOUNTER — Telehealth: Payer: Self-pay | Admitting: *Deleted

## 2018-04-27 LAB — BASIC METABOLIC PANEL
BUN/Creatinine Ratio: 18 (ref 9–23)
BUN: 15 mg/dL (ref 6–24)
CALCIUM: 9.8 mg/dL (ref 8.7–10.2)
CHLORIDE: 98 mmol/L (ref 96–106)
CO2: 25 mmol/L (ref 20–29)
Creatinine, Ser: 0.82 mg/dL (ref 0.57–1.00)
GFR calc non Af Amer: 82 mL/min/{1.73_m2} (ref >59)
GFR, EST AFRICAN AMERICAN: 94 mL/min/{1.73_m2} (ref >59)
GLUCOSE: 94 mg/dL (ref 65–99)
Potassium: 3.8 mmol/L (ref 3.5–5.2)
Sodium: 138 mmol/L (ref 134–144)

## 2018-04-27 LAB — CBC
Hematocrit: 36.5 % (ref 34.0–46.6)
Hemoglobin: 12.4 g/dL (ref 11.1–15.9)
MCH: 26.3 pg — ABNORMAL LOW (ref 26.6–33.0)
MCHC: 34 g/dL (ref 31.5–35.7)
MCV: 77 fL — ABNORMAL LOW (ref 79–97)
Platelets: 301 10*3/uL (ref 150–450)
RBC: 4.72 x10E6/uL (ref 3.77–5.28)
RDW: 14.3 % (ref 12.3–15.4)
WBC: 6.5 10*3/uL (ref 3.4–10.8)

## 2018-04-27 LAB — TSH: TSH: 1.25 u[IU]/mL (ref 0.450–4.500)

## 2018-04-27 NOTE — Telephone Encounter (Signed)
Pt informed of below. Zimmerman Rumple, April D, CMA  

## 2018-04-27 NOTE — Telephone Encounter (Signed)
-----   Message from Sela Hilding, MD sent at 04/27/2018  9:35 AM EST ----- Please let patient know that unfortunately the labs didn't show a cause for her dizziness. She should follow up with Dr. Erin Hearing at the visit she scheduled. It is reassuring that her potassium is normal at 3.8, not low like she was worried about. Her thyroid function and blood levels are normal.

## 2018-05-01 DIAGNOSIS — M25472 Effusion, left ankle: Secondary | ICD-10-CM | POA: Diagnosis not present

## 2018-05-01 DIAGNOSIS — M25475 Effusion, left foot: Secondary | ICD-10-CM | POA: Diagnosis not present

## 2018-05-01 DIAGNOSIS — M79675 Pain in left toe(s): Secondary | ICD-10-CM | POA: Diagnosis not present

## 2018-05-01 DIAGNOSIS — M79672 Pain in left foot: Secondary | ICD-10-CM | POA: Diagnosis not present

## 2018-05-07 DIAGNOSIS — M79675 Pain in left toe(s): Secondary | ICD-10-CM | POA: Diagnosis not present

## 2018-05-07 DIAGNOSIS — M79672 Pain in left foot: Secondary | ICD-10-CM | POA: Diagnosis not present

## 2018-05-07 DIAGNOSIS — M25472 Effusion, left ankle: Secondary | ICD-10-CM | POA: Diagnosis not present

## 2018-05-07 DIAGNOSIS — M25475 Effusion, left foot: Secondary | ICD-10-CM | POA: Diagnosis not present

## 2018-05-09 ENCOUNTER — Ambulatory Visit: Payer: BLUE CROSS/BLUE SHIELD | Admitting: Family Medicine

## 2018-05-09 ENCOUNTER — Encounter: Payer: Self-pay | Admitting: Family Medicine

## 2018-05-09 DIAGNOSIS — H8113 Benign paroxysmal vertigo, bilateral: Secondary | ICD-10-CM | POA: Diagnosis not present

## 2018-05-09 DIAGNOSIS — H8110 Benign paroxysmal vertigo, unspecified ear: Secondary | ICD-10-CM | POA: Insufficient documentation

## 2018-05-09 DIAGNOSIS — Z780 Asymptomatic menopausal state: Secondary | ICD-10-CM | POA: Diagnosis not present

## 2018-05-09 MED ORDER — MECLIZINE HCL 12.5 MG PO TABS: 12.5000 mg | ORAL_TABLET | Freq: Two times a day (BID) | ORAL | 0 refills | Status: DC | PRN

## 2018-05-09 NOTE — Patient Instructions (Signed)
I think you have BPV   It should go away by itself  If you have ant attack that causes nausea then try the meclizine  If it is not gone in 6 weeks or is getting worse of any other symptoms - weakness or visual changes call us  Come back of a pap smear

## 2018-05-09 NOTE — Assessment & Plan Note (Signed)
Given episodic nature, lack of other findings presentation is consistent with  BPV .  If develops episode again with nausea could try antivert as needed.

## 2018-05-09 NOTE — Progress Notes (Signed)
Subjective  [name] is a 53 y.o. female is presenting with the following  DIZZINESS Still having discrete episodes but less frequently.  Last 4 days ago associated with vertigo and nausea.  No weakness or visual changes or headache.  Has not tried any medications.  No falls   HOT FLASHES Episodes of heat and fatigue. Come and go.  No fever or shortness of breath or cough or dysuri or weight loss. Does not have menstrual periods since ablation years ago.    Chief Complaint noted Review of Symptoms - see HPI PMH - Smoking status noted.    Objective Vital Signs reviewed BP 128/78   Pulse (!) 55   Temp 99.3 F (37.4 C) (Oral)   Wt 223 lb 6.4 oz (101.3 kg)   SpO2 99%   BMI 33.97 kg/m  Eye - Pupils Equal Round Reactive to light, Extraocular movements intact, Conjunctiva without redness or discharge Neck:  No deformities, thyromegaly, masses, or tenderness noted.   Supple with full range of motion without pain. Ears:  External ear exam shows no significant lesions or deformities.  Otoscopic examination reveals clear canals, tympanic membranes are intact bilaterally without bulging, retraction, inflammation or discharge. Hearing is grossly normal bilaterall Lungs:  Normal respiratory effort, chest expands symmetrically. Lungs are clear to auscultation, no crackles or wheezes. Heart - Regular rate and rhythm.  No murmurs, gallops or rubs.     Assessments/Plans  See after visit summary for details of patient instuctions  Benign paroxysmal positional vertigo Given episodic nature, lack of other findings presentation is consistent with  BPV .  If develops episode again with nausea could try antivert as needed.    Menopause Her hot flashes seem consistent with this.   Recent blood work was normal.  Is not greatly interfering with her life so no treatment currently

## 2018-05-09 NOTE — Assessment & Plan Note (Signed)
Her hot flashes seem consistent with this.   Recent blood work was normal.  Is not greatly interfering with her life so no treatment currently

## 2018-05-14 ENCOUNTER — Ambulatory Visit: Payer: BLUE CROSS/BLUE SHIELD | Admitting: Family Medicine

## 2018-05-14 ENCOUNTER — Other Ambulatory Visit: Payer: Self-pay

## 2018-05-14 ENCOUNTER — Other Ambulatory Visit (HOSPITAL_COMMUNITY)
Admission: RE | Admit: 2018-05-14 | Discharge: 2018-05-14 | Disposition: A | Discharge status: Home / Self Care | Payer: BLUE CROSS/BLUE SHIELD | Source: Ambulatory Visit | Attending: Family Medicine | Admitting: Family Medicine

## 2018-05-14 ENCOUNTER — Encounter: Payer: Self-pay | Admitting: Family Medicine

## 2018-05-14 VITALS — BP 152/88 | HR 87 | Temp 99.7°F | Wt 221.8 lb

## 2018-05-14 DIAGNOSIS — Z124 Encounter for screening for malignant neoplasm of cervix: Secondary | ICD-10-CM

## 2018-05-14 NOTE — Progress Notes (Signed)
Subjective  [name] is a 53 y.o. female is presenting for a pap smear  No complaints  Chief Complaint noted Review of Symptoms - see HPI PMH - Smoking status noted.   LMP 2012 after ablation   Objective Vital Signs reviewed There were no vitals taken for this visit. Genitalia:  Normal introitus for age, no external lesions, no vaginal discharge, mucosa pink and moist, no vaginal or cervical lesions, no vaginal atrophy, no friaility or hemorrhage, Cervix is high and to the left  Assessments/Plans  Normal vaginal exam - Pap collected

## 2018-05-14 NOTE — Patient Instructions (Addendum)
Your are up to date on all your Health Maintenance  The best way to stay healthy is to exercise and eat healthy  Happy Rudene Anda

## 2018-05-16 LAB — CYTOLOGY - PAP
DIAGNOSIS: NEGATIVE
HPV (WINDOPATH): NOT DETECTED

## 2018-05-27 NOTE — Progress Notes (Signed)
Cardiology Office Note   Date:  05/27/2018   ID:  [name], DOB 10-Apr-1966, MRN [id number]  PCP:  Lind Covert, MD  Cardiologist:   Jenkins Rouge, MD   No chief complaint on file.     History of Present Illness: [name] is a 53 y.o. female who presents for consultation regarding dizziness and bradycardia. Referred by Dr Lindell Noe. She has had recent dizziness and nausea  Worse starting after Thanksgiving. Can occur laying down last Hours usually accompanied by nausea. Previous hypokalemia with these feelings K 3.8 04/26/18 TSH and Hct also  Normal History of endometrial ablation and saw DR Erin Hearing recently he thought symptoms consistent with BPV And suggested antivert. I saw her in 2014 for benign palpitations with normal holter and TTE normal EF only mild AR Seen again in February 2017 and had normal lexiscan myovue  Activity is limited by previous right ankle replacement  No chest pain palpitations or syncope   Past Medical History:  Diagnosis Date  . Allergy   . Arthritis    Lt ankle  . Chronic kidney disease    right renal cyst   . GERD (gastroesophageal reflux disease)    hx of no problems at present   . Headache(784.0)    occasional headache   . Hypertension   . Influenza 04/20/2012  . PONV (postoperative nausea and vomiting)    N/V and trouble urinating after surgery     Past Surgical History:  Procedure Laterality Date  . BREAST EXCISIONAL BIOPSY    . BREAST SURGERY  2010   lumpectomy Lt breast  . CARPAL TUNNEL RELEASE Right 08/06/2015   Procedure: RIGHT CARPAL TUNNEL RELEASE;  Surgeon: Leanora Cover, MD;  Location: [place];  Service: Orthopedics;  Laterality: Right;  . DORSAL COMPARTMENT RELEASE Right 08/06/2015   Procedure: RIGHT FIRST RELEASE DORSAL COMPARTMENT (DEQUERVAIN);  Surgeon: Leanora Cover, MD;  Location: [facility];  Service: Orthopedics;  Laterality: Right;  . ENDOMETRIAL ABLATION  October 18 2010   at [facility]  . FRACTURE SURGERY  2011   Lt ankle surgeries x 3   . JOINT REPLACEMENT     left ankle 8/12   . LAPAROSCOPIC NEPHRECTOMY  03/09/2011   Procedure: LAPAROSCOPIC NEPHRECTOMY;  Surgeon: Franchot Gallo;  [facility]: [facility];  Service: Urology;  Laterality: Right;  Laparoscopic Assisted Removal Of Renal Cyst   . OTHER SURGICAL HISTORY     2 hand surgeries on left and shoulder surgery on right shoulder   . TOTAL ANKLE ARTHROPLASTY Left 07/05/2012   Procedure: Removal of total ankle implants,I & D with insertion of  new poly spacer;  Surgeon: Wylene Simmer, MD;  Location: [place];  Service: Orthopedics;  Laterality: Left;  . TUBAL LIGATION  1988     Current Outpatient Medications  Medication Sig Dispense Refill  . aspirin EC 81 MG tablet Take 81 mg by mouth daily.    . Cholecalciferol ([employer]S VIT D 5000 HIGH-POTENCY PO) Take by mouth daily.    . indapamide (LOZOL) 1.25 MG tablet TAKE 1 TABLET BY MOUTH EVERY DAY 90 tablet 3  . meclizine (ANTIVERT) 12.5 MG tablet Take 1 tablet (12.5 mg total) by mouth 2 (two) times daily as needed for dizziness. 20 tablet 0  . spironolactone (ALDACTONE) 25 MG tablet TAKE 1 TABLET BY MOUTH EVERY DAY 90 tablet 0  . vitamin E 400 UNIT capsule Take 400 Units by mouth daily.  No current facility-administered medications for this visit.     Allergies:   Tetracycline hcl; Angiotensin receptor blockers; Septra [bactrim]; and Sulfamethoxazole-trimethoprim    Social History:  The patient  reports that she has never smoked. She has never used smokeless tobacco. She reports that she does not drink alcohol or use drugs.   Family History:  The patient's family history includes Arthritis in her brother; BRCA 1/2 in her brother; Cancer in her maternal grandmother; Diabetes in her mother, sister, and sister; Heart disease in her mother; Hypertension in her brother, brother, father, sister, sister, and sister; Kidney disease in her brother and sister; Mental illness  in her brother.    ROS:  Please see the history of present illness.   Otherwise, review of systems are positive for none.   All other systems are reviewed and negative.    PHYSICAL EXAM: VS:  There were no vitals taken for this visit. , BMI There is no height or weight on file to calculate BMI. Affect appropriate Healthy:  appears stated age 51: normal Neck supple with no adenopathy JVP normal no bruits no thyromegaly Lungs clear with no wheezing and good diaphragmatic motion Heart:  S1/S2 no murmur, no rub, gallop or click PMI normal Abdomen: benighn, BS positve, no tenderness, no AAA no bruit.  No HSM or HJR Distal pulses intact with no bruits No edema Neuro non-focal Skin warm and dry Previous left ankle surgery     EKG:  07/28/16 SR rate 71 normal     Recent Labs: 04/26/2018: BUN 15; Creatinine, Ser 0.82; Hemoglobin 12.4; Platelets 301; Potassium 3.8; Sodium 138; TSH 1.250    Lipid Panel    Component Value Date/Time   CHOL 164 04/13/2015 1609   TRIG 156 (H) 04/13/2015 1609   HDL 42 (L) 04/13/2015 1609   CHOLHDL 3.9 04/13/2015 1609   VLDL 31 (H) 04/13/2015 1609   LDLCALC 91 04/13/2015 1609      Wt Readings from Last 3 Encounters:  05/14/18 221 lb 12.8 oz (100.6 kg)  05/09/18 223 lb 6.4 oz (101.3 kg)  04/26/18 223 lb 3.2 oz (101.2 kg)      Other studies Reviewed: Additional studies/ records that were reviewed today include: notes from primary Ob, labs previous cardiology  Notes TTE 2014, holter 2014 myovue 2017.    ASSESSMENT AND PLAN:  1.  Dizziness: most likely from inner ear very low likelihood cardiac etiology no further w/u needed  2. AR mild in 2014  No murmur observe  3. HTN: not clear why she is on 2 diuretics ? History low K so on aldactone f/u primary to discuss    Current medicines are reviewed at length with the patient today.  The patient does not have concerns regarding medicines.  The following changes have been made:  None    Labs/ tests ordered today include:  None  No orders of the defined types were placed in this encounter.    Disposition:   FU with cardiology PRN      Signed, Jenkins Rouge, MD  05/27/2018 3:33 PM    [facility] [place], [place], [place]  [address] Phone: [phone]; Fax: [phone]

## 2018-05-29 ENCOUNTER — Ambulatory Visit: Payer: BLUE CROSS/BLUE SHIELD | Admitting: Cardiovascular Disease

## 2018-05-29 ENCOUNTER — Encounter (INDEPENDENT_AMBULATORY_CARE_PROVIDER_SITE_OTHER): Payer: Self-pay

## 2018-05-29 VITALS — BP 142/94 | HR 60 | Ht 68.0 in | Wt 224.0 lb

## 2018-05-29 DIAGNOSIS — R42 Dizziness and giddiness: Secondary | ICD-10-CM | POA: Diagnosis not present

## 2018-05-29 NOTE — Progress Notes (Signed)
Cardiology Office Note   Date:  05/29/2018   ID:  [name], DOB [birth year], MRN [id number]  PCP:  Lind Covert, MD  Cardiologist:   Jenkins Rouge, MD   No chief complaint on file.     History of Present Illness: [name] is a 53 y.o. female who presents for consultation regarding dizziness and bradycardia. Referred by Dr Lindell Noe. She has had recent dizziness and nausea  Worse starting after Thanksgiving. Can occur laying down last Hours usually accompanied by nausea. Previous hypokalemia with these feelings K 3.8 04/26/18 TSH and Hct also  Normal History of endometrial ablation and saw DR Erin Hearing recently he thought symptoms consistent with BPV And suggested antivert. I saw her in 2014 for benign palpitations with normal holter and TTE normal EF only mild AR Seen again in February 2017 and had normal lexiscan myovue  Activity is limited by previous right ankle replacement  She has no chest pain , dyspnea or syncope   Past Medical History:  Diagnosis Date  . Allergy   . Arthritis    Lt ankle  . Chronic kidney disease    right renal cyst   . GERD (gastroesophageal reflux disease)    hx of no problems at present   . Headache(784.0)    occasional headache   . Hypertension   . Influenza 04/20/2012  . PONV (postoperative nausea and vomiting)    N/V and trouble urinating after surgery     Past Surgical History:  Procedure Laterality Date  . BREAST EXCISIONAL BIOPSY    . BREAST SURGERY  2010   lumpectomy Lt breast  . CARPAL TUNNEL RELEASE Right 08/06/2015   Procedure: RIGHT CARPAL TUNNEL RELEASE;  Surgeon: Leanora Cover, MD;  Location: [facility];  Service: Orthopedics;  Laterality: Right;  . DORSAL COMPARTMENT RELEASE Right 08/06/2015   Procedure: RIGHT FIRST RELEASE DORSAL COMPARTMENT (DEQUERVAIN);  Surgeon: Leanora Cover, MD;  Location: [place];  Service: Orthopedics;  Laterality: Right;  . ENDOMETRIAL ABLATION  October 18 2010   at [facility]  . FRACTURE SURGERY  2011   Lt ankle surgeries x 3   . JOINT REPLACEMENT     left ankle 8/12   . LAPAROSCOPIC NEPHRECTOMY  03/09/2011   Procedure: LAPAROSCOPIC NEPHRECTOMY;  Surgeon: Franchot Gallo;  [facility];  Service: Urology;  Laterality: Right;  Laparoscopic Assisted Removal Of Renal Cyst   . OTHER SURGICAL HISTORY     2 hand surgeries on left and shoulder surgery on right shoulder   . TOTAL ANKLE ARTHROPLASTY Left 07/05/2012   Procedure: Removal of total ankle implants,I & D with insertion of  new poly spacer;  Surgeon: Wylene Simmer, MD;  Location: [place];  Service: Orthopedics;  Laterality: Left;  . TUBAL LIGATION  1988     Current Outpatient Medications  Medication Sig Dispense Refill  . aspirin EC 81 MG tablet Take 81 mg by mouth daily.    . Cholecalciferol (CVS VIT D 5000 HIGH-POTENCY PO) Take by mouth daily.    . indapamide (LOZOL) 1.25 MG tablet TAKE 1 TABLET BY MOUTH EVERY DAY 90 tablet 3  . meclizine (ANTIVERT) 12.5 MG tablet Take 1 tablet (12.5 mg total) by mouth 2 (two) times daily as needed for dizziness. 20 tablet 0  . spironolactone (ALDACTONE) 25 MG tablet TAKE 1 TABLET BY MOUTH EVERY DAY 90 tablet 0  . vitamin E 400 UNIT capsule Take 400 Units by mouth daily.  No current facility-administered medications for this visit.     Allergies:   Tetracycline hcl; Angiotensin receptor blockers; Nickel; Septra [bactrim]; and Sulfamethoxazole-trimethoprim    Social History:  The patient  reports that she has never smoked. She has never used smokeless tobacco. She reports that she does not drink alcohol or use drugs.   Family History:  The patient's family history includes Arthritis in her brother; BRCA 1/2 in her brother; Cancer in her maternal grandmother; Diabetes in her mother, sister, and sister; Heart disease in her mother; Hypertension in her brother, brother, father, sister, sister, and sister; Kidney disease in her brother and sister;  Mental illness in her brother.    ROS:  Please see the history of present illness.   Otherwise, review of systems are positive for none.   All other systems are reviewed and negative.    PHYSICAL EXAM: VS:  BP (!) 142/94   Pulse 60   Ht '5\' 8"'  (1.727 m)   Wt 224 lb (101.6 kg)   BMI 34.06 kg/m  , BMI Body mass index is 34.06 kg/m. Affect appropriate Healthy:  appears stated age 43: normal Neck supple with no adenopathy JVP normal no bruits no thyromegaly Lungs clear with no wheezing and good diaphragmatic motion Heart:  S1/S2 no murmur, no rub, gallop or click PMI normal Abdomen: benighn, BS positve, no tenderness, no AAA no bruit.  No HSM or HJR Distal pulses intact with no bruits No edema Neuro non-focal Skin warm and dry Previous left ankle surgery     EKG:  07/28/16 SR rate 71 normal     Recent Labs: 04/26/2018: BUN 15; Creatinine, Ser 0.82; Hemoglobin 12.4; Platelets 301; Potassium 3.8; Sodium 138; TSH 1.250    Lipid Panel    Component Value Date/Time   CHOL 164 04/13/2015 1609   TRIG 156 (H) 04/13/2015 1609   HDL 42 (L) 04/13/2015 1609   CHOLHDL 3.9 04/13/2015 1609   VLDL 31 (H) 04/13/2015 1609   LDLCALC 91 04/13/2015 1609      Wt Readings from Last 3 Encounters:  05/29/18 224 lb (101.6 kg)  05/14/18 221 lb 12.8 oz (100.6 kg)  05/09/18 223 lb 6.4 oz (101.3 kg)      Other studies Reviewed: Additional studies/ records that were reviewed today include: notes from primary Ob, labs previous cardiology  Notes TTE 2014, holter 2014 myovue 2017.    ASSESSMENT AND PLAN:  1.  Dizziness: most likely from inner ear very low likelihood cardiac etiology no further w/u indicated  2. AR mild in 2014 no murmur on exam observe  3. HTN: not clear why she is on 2 diuretics ? History low K so on aldactone f/u primary to discuss   Current medicines are reviewed at length with the patient today.  The patient does not have concerns regarding medicines.  The  following changes have been made:  None   Labs/ tests ordered today include:  Refer Rudell Cobb Vestibular PT No orders of the defined types were placed in this encounter.    Disposition:   FU with cardiology PRN      Signed, Jenkins Rouge, MD  05/29/2018 3:52 PM    [facility] [place], [place], [place]  [address] Phone: [phone]; Fax: [phone]

## 2018-05-29 NOTE — Patient Instructions (Addendum)
Medication Instructions:  Your physician recommends that you continue on your current medications as directed. Please refer to the Current Medication list given to you today.  Labwork: NONE  Testing/Procedures: NONE  Follow-Up: Your physician wants you to follow-up as needed with Dr. Johnsie Cancel.   You have been referred to [facility] to see Rudell Cobb, PT  for Vertigo.   If you need a refill on your cardiac medications before your next appointment, please call your pharmacy.

## 2018-06-23 ENCOUNTER — Other Ambulatory Visit: Payer: Self-pay | Admitting: Family Medicine

## 2018-06-27 DIAGNOSIS — M19072 Primary osteoarthritis, left ankle and foot: Secondary | ICD-10-CM | POA: Diagnosis not present

## 2018-06-28 ENCOUNTER — Encounter (HOSPITAL_COMMUNITY): Payer: Self-pay | Admitting: Emergency Medicine

## 2018-06-28 ENCOUNTER — Emergency Department (HOSPITAL_COMMUNITY)
Admission: EM | Admit: 2018-06-28 | Discharge: 2018-06-28 | Disposition: A | Discharge status: Home / Self Care | Payer: BLUE CROSS/BLUE SHIELD | Attending: Emergency Medicine | Admitting: Emergency Medicine

## 2018-06-28 ENCOUNTER — Other Ambulatory Visit: Payer: Self-pay

## 2018-06-28 DIAGNOSIS — Z7982 Long term (current) use of aspirin: Secondary | ICD-10-CM | POA: Insufficient documentation

## 2018-06-28 DIAGNOSIS — Z96662 Presence of left artificial ankle joint: Secondary | ICD-10-CM | POA: Insufficient documentation

## 2018-06-28 DIAGNOSIS — M545 Low back pain, unspecified: Secondary | ICD-10-CM

## 2018-06-28 DIAGNOSIS — Z79899 Other long term (current) drug therapy: Secondary | ICD-10-CM | POA: Insufficient documentation

## 2018-06-28 DIAGNOSIS — I10 Essential (primary) hypertension: Secondary | ICD-10-CM | POA: Insufficient documentation

## 2018-06-28 MED ORDER — CYCLOBENZAPRINE HCL 10 MG PO TABS: 10.0000 mg | ORAL_TABLET | Freq: Two times a day (BID) | ORAL | 0 refills | Status: DC | PRN

## 2018-06-28 MED ORDER — KETOROLAC TROMETHAMINE 30 MG/ML IJ SOLN
30.0000 mg | Freq: Once | INTRAMUSCULAR | Status: AC
  Administered 2018-06-28: 30 mg via INTRAMUSCULAR
  Filled 2018-06-28: qty 1

## 2018-06-28 MED ORDER — LIDOCAINE 5 % EX PTCH: 1.0000 | MEDICATED_PATCH | CUTANEOUS | 0 refills | Status: DC

## 2018-06-28 MED ORDER — IBUPROFEN 800 MG PO TABS: 800.0000 mg | ORAL_TABLET | Freq: Three times a day (TID) | ORAL | 0 refills | Status: DC

## 2018-06-28 NOTE — ED Provider Notes (Signed)
[facility] EMERGENCY DEPARTMENT Provider Note   CSN: [id number] Arrival date & time: 06/28/18  0325    History   Chief Complaint Chief Complaint  Patient presents with  . Back Pain    left lower    HPI [name] is a 53 y.o. female.     Patient presents to the emergency department with a chief complaint of left-sided low back pain.  She states the symptoms started this morning.  She states that at the end of last week she was lifting heavier than normal materials at work and may have strained her back.  She denies any other injuries.  Denies any hematuria dysuria, vaginal discharge, or bleeding.  Denies any fevers or chills.  Denies any history of cancer in herself.  Denies any injection drug use.  She denies any numbness, weakness, or tingling.  Her symptoms are worsened with movement and palpation.  The history is provided by the patient. No language interpreter was used.    Past Medical History:  Diagnosis Date  . Allergy   . Arthritis    Lt ankle  . Chronic kidney disease    right renal cyst   . GERD (gastroesophageal reflux disease)    hx of no problems at present   . Headache(784.0)    occasional headache   . Hypertension   . Influenza 04/20/2012  . PONV (postoperative nausea and vomiting)    N/V and trouble urinating after surgery     Patient Active Problem List   Diagnosis Date Noted  . Benign paroxysmal positional vertigo 05/09/2018  . Menopause 05/09/2018  . Nickel allergy 11/29/2017  . Ganglion cyst 08/12/2016  . Angioedema 06/16/2016  . Obesity (BMI 30-39.9) 11/17/2010  . Breast ductal hyperplasia, atypical 10/30/2010  . KIDNEY CYST, ACQUIRED 03/19/2007  . HYPERTENSION, BENIGN SYSTEMIC 06/29/2006    Past Surgical History:  Procedure Laterality Date  . BREAST EXCISIONAL BIOPSY    . BREAST SURGERY  2010   lumpectomy Lt breast  . CARPAL TUNNEL RELEASE Right 08/06/2015   Procedure: RIGHT CARPAL TUNNEL RELEASE;  Surgeon:  Leanora Cover, MD;  Location: [facility];  Service: Orthopedics;  Laterality: Right;  . DORSAL COMPARTMENT RELEASE Right 08/06/2015   Procedure: RIGHT FIRST RELEASE DORSAL COMPARTMENT (DEQUERVAIN);  Surgeon: Leanora Cover, MD;  Location: [facility];  Service: Orthopedics;  Laterality: Right;  . ENDOMETRIAL ABLATION  October 18 2010   at [facility]  . FRACTURE SURGERY  2011   Lt ankle surgeries x 3   . JOINT REPLACEMENT     left ankle 8/12   . LAPAROSCOPIC NEPHRECTOMY  03/09/2011   Procedure: LAPAROSCOPIC NEPHRECTOMY;  Surgeon: Franchot Gallo;  [facility];  Service: Urology;  Laterality: Right;  Laparoscopic Assisted Removal Of Renal Cyst   . OTHER SURGICAL HISTORY     2 hand surgeries on left and shoulder surgery on right shoulder   . TOTAL ANKLE ARTHROPLASTY Left 07/05/2012   Procedure: Removal of total ankle implants,I & D with insertion of  new poly spacer;  Surgeon: Wylene Simmer, MD;  Location: [facility];  Service: Orthopedics;  Laterality: Left;  . TUBAL LIGATION  1988     OB History   No obstetric history on file.      Home Medications    Prior to Admission medications   Medication Sig Start Date End Date Taking? Authorizing Provider  aspirin EC 81 MG tablet Take 81 mg by mouth daily.    [provider]  Cholecalciferol (CVS VIT D 5000 HIGH-POTENCY PO) Take by mouth daily.    [provider]  indapamide (LOZOL) 1.25 MG tablet TAKE 1 TABLET BY MOUTH EVERY DAY 01/22/18   Lind Covert, MD  meclizine (ANTIVERT) 12.5 MG tablet Take 1 tablet (12.5 mg total) by mouth 2 (two) times daily as needed for dizziness. 05/09/18   Lind Covert, MD  spironolactone (ALDACTONE) 25 MG tablet TAKE 1 TABLET BY MOUTH EVERY DAY 06/25/18   Lind Covert, MD  vitamin E 400 UNIT capsule Take 400 Units by mouth daily.     [provider]    Family History Family History  Problem Relation Age of Onset  . Diabetes Mother   . Heart  disease Mother   . Hypertension Father   . Diabetes Sister   . Kidney disease Sister   . Hypertension Brother   . Kidney disease Brother   . Arthritis Brother   . Cancer Maternal Grandmother   . Diabetes Sister   . Hypertension Sister   . Hypertension Sister   . Hypertension Sister   . Hypertension Brother   . Mental illness Brother   . BRCA 1/2 Brother   . Colon cancer Neg Hx     Social History Social History   Tobacco Use  . Smoking status: Never Smoker  . Smokeless tobacco: Never Used  Substance Use Topics  . Alcohol use: No    Alcohol/week: 0.0 standard drinks  . Drug use: No     Allergies   Tetracycline hcl; Angiotensin receptor blockers; Nickel; Septra [bactrim]; and Sulfamethoxazole-trimethoprim   Review of Systems Review of Systems  All other systems reviewed and are negative.    Physical Exam Updated Vital Signs BP (!) 158/83 (BP Location: Right Arm)   Pulse 60   Temp 98 F (36.7 C) (Oral)   Resp 18   Ht _0  (1.727 m)   Wt 99.8 kg   LMP  (Exact Date)   SpO2 100%   BMI 33.45 kg/m   Physical Exam  Physical Exam  Constitutional: Pt appears well-developed and well-nourished. No distress.  HENT:  Head: Normocephalic and atraumatic.  Mouth/Throat: Oropharynx is clear and moist. No oropharyngeal exudate.  Eyes: Conjunctivae are normal.  Neck: Normal range of motion. Neck supple.  No meningismus Cardiovascular: Normal rate, regular rhythm and intact distal pulses.   Pulmonary/Chest: Effort normal and breath sounds normal. No respiratory distress. Pt has no wheezes.  Abdominal: Pt exhibits no distension Musculoskeletal:  Left lumbar tenderness to palpation, no bony CTLS spine tenderness, deformity, step-off, or crepitus Lymphadenopathy: Pt has no cervical adenopathy.  Neurological: Pt is alert and oriented Speech is clear and goal oriented, follows commands Normal 5/5 strength in upper and lower extremities bilaterally including dorsiflexion  and plantar flexion, strong and equal grip strength Sensation intact Great toe extension intact Moves extremities without ataxia, coordination intact Normal gait Normal balance No Clonus Skin: Skin is warm and dry. No rash noted. Pt is not diaphoretic. No erythema.  Psychiatric: Pt has a normal mood and affect. Behavior is normal.  Nursing note and vitals reviewed.  ED Treatments / Results  Labs (all labs ordered are listed, but only abnormal results are displayed) Labs Reviewed - No data to display  EKG None  Radiology No results found.  Procedures Procedures (including critical care time)  Medications Ordered in ED Medications  ketorolac (TORADOL) 30 MG/ML injection 30 mg (30 mg Intramuscular Given 06/28/18 0431)  Initial Impression / Assessment and Plan / ED Course  I have reviewed the triage vital signs and the nursing notes.  Pertinent labs & imaging results that were available during my care of the patient were reviewed by me and considered in my medical decision making (see chart for details).        Patient with back pain.    No neurological deficits and normal neuro exam.  Patient is ambulatory.  No loss of bowel or bladder control.  Doubt cauda equina.  Denies fever,  doubt epidural abscess or other lesion. Recommend back exercises, stretching, RICE, and will treat with a short course of flexeril.  Encouraged the patient that there could be a need for additional workup and/or imaging such as MRI, if the symptoms do not resolve. Patient advised that if the back pain does not resolve, or radiates, this could progress to more serious conditions and is encouraged to follow-up with PCP or orthopedics within 2 weeks.     Final Clinical Impressions(s) / ED Diagnoses   Final diagnoses:  Acute left-sided low back pain without sciatica    ED Discharge Orders         Ordered    cyclobenzaprine (FLEXERIL) 10 MG tablet  2 times daily PRN     06/28/18 0435     ibuprofen (ADVIL,MOTRIN) 800 MG tablet  3 times daily     06/28/18 0435    lidocaine (LIDODERM) 5 %  Every 24 hours     06/28/18 0435           Montine Circle, PA-C 06/28/18 0436    Leonette Monarch Grayce Sessions, MD 06/28/18 [phone]

## 2018-06-28 NOTE — ED Triage Notes (Addendum)
Pt reports left lower back pain that started yesterday. Pt denies any injury but does have a hx of renal cycsts. Pt denies urinary symptoms.

## 2018-09-12 DIAGNOSIS — M19072 Primary osteoarthritis, left ankle and foot: Secondary | ICD-10-CM | POA: Diagnosis not present

## 2018-10-03 DIAGNOSIS — Z981 Arthrodesis status: Secondary | ICD-10-CM | POA: Diagnosis not present

## 2018-10-03 DIAGNOSIS — Y831 Surgical operation with implant of artificial internal device as the cause of abnormal reaction of the patient, or of later complication, without mention of misadventure at the time of the procedure: Secondary | ICD-10-CM | POA: Diagnosis not present

## 2018-10-03 DIAGNOSIS — T84039D Mechanical loosening of unspecified internal prosthetic joint, subsequent encounter: Secondary | ICD-10-CM | POA: Diagnosis not present

## 2018-10-03 DIAGNOSIS — M19072 Primary osteoarthritis, left ankle and foot: Secondary | ICD-10-CM | POA: Diagnosis not present

## 2018-10-03 DIAGNOSIS — Z96662 Presence of left artificial ankle joint: Secondary | ICD-10-CM | POA: Diagnosis not present

## 2018-10-26 ENCOUNTER — Telehealth: Payer: Self-pay | Admitting: *Deleted

## 2018-10-26 NOTE — Telephone Encounter (Signed)
   Primary Cardiologist: No primary care provider on file.  Chart reviewed as part of pre-operative protocol coverage. Patient was contacted 10/26/2018 in reference to pre-operative risk assessment for pending surgery as outlined below.  [name] was last seen on 05/29/2018 by Dr. Johnsie Cancel.  Since that day, [name] has done well without any chest pain or shortness of breath.  Therefore, based on ACC/AHA guidelines, the patient would be at acceptable risk for the planned procedure without further cardiovascular testing.   I will route this recommendation to the requesting party via Epic fax function and remove from pre-op pool.  Please call with questions. She may hold aspirin for 5-7 days prior to the surgery and restart it as soon as possible after the surgery at the discretion of the surgeon.  [place], [place] 10/26/2018, 3:41 PM

## 2018-10-26 NOTE — Telephone Encounter (Signed)
   [facility] Pre-operative Risk Assessment    Request for surgical clearance:  1. What type of surgery is being performed? LEFT ANKLE SUBTALAR FUSION W/VALGUS TILT and MIDFOOT DEROTATIONAL OSTEOTOMY   2. When is this surgery scheduled? 11/13/18  3. What type of clearance is required (medical clearance vs. Pharmacy clearance to hold med vs. Both)? MEDICAL  4. Are there any medications that need to be held prior to surgery and how long? ASA  5. Practice name and name of physician performing surgery? [facility]; DR. Donnie Mesa  6. What is your office phone number [phone]   7.   What is your office fax number [phone]  8.   Anesthesia type (None, local, MAC, general) ? NOT LISTED; GENERAL?   Julaine Hua 10/26/2018, 11:38 AM  _________________________________________________________________   (provider comments below)

## 2018-10-26 NOTE — Telephone Encounter (Signed)
Left message for the patient to call back and speak to the on call preop APP to discuss clearance.

## 2018-11-10 DIAGNOSIS — Z1159 Encounter for screening for other viral diseases: Secondary | ICD-10-CM | POA: Diagnosis not present

## 2018-11-10 DIAGNOSIS — Z01818 Encounter for other preprocedural examination: Secondary | ICD-10-CM | POA: Diagnosis not present

## 2018-11-13 DIAGNOSIS — I10 Essential (primary) hypertension: Secondary | ICD-10-CM | POA: Diagnosis not present

## 2018-11-13 DIAGNOSIS — M25572 Pain in left ankle and joints of left foot: Secondary | ICD-10-CM | POA: Diagnosis not present

## 2018-11-13 DIAGNOSIS — Z472 Encounter for removal of internal fixation device: Secondary | ICD-10-CM | POA: Diagnosis not present

## 2018-11-13 DIAGNOSIS — Z79899 Other long term (current) drug therapy: Secondary | ICD-10-CM | POA: Diagnosis not present

## 2018-11-13 DIAGNOSIS — G8918 Other acute postprocedural pain: Secondary | ICD-10-CM | POA: Diagnosis not present

## 2018-11-13 DIAGNOSIS — M19072 Primary osteoarthritis, left ankle and foot: Secondary | ICD-10-CM | POA: Diagnosis not present

## 2018-11-26 DIAGNOSIS — M19072 Primary osteoarthritis, left ankle and foot: Secondary | ICD-10-CM | POA: Diagnosis not present

## 2018-11-26 DIAGNOSIS — Z96662 Presence of left artificial ankle joint: Secondary | ICD-10-CM | POA: Diagnosis not present

## 2018-11-26 DIAGNOSIS — T84039D Mechanical loosening of unspecified internal prosthetic joint, subsequent encounter: Secondary | ICD-10-CM | POA: Diagnosis not present

## 2018-11-26 DIAGNOSIS — M6702 Short Achilles tendon (acquired), left ankle: Secondary | ICD-10-CM | POA: Diagnosis not present

## 2018-12-05 ENCOUNTER — Encounter: Payer: BLUE CROSS/BLUE SHIELD | Admitting: Family Medicine

## 2018-12-11 ENCOUNTER — Encounter: Payer: BC Managed Care – PPO | Admitting: Family Medicine

## 2018-12-15 ENCOUNTER — Other Ambulatory Visit: Payer: Self-pay | Admitting: Family Medicine

## 2018-12-24 DIAGNOSIS — Z96662 Presence of left artificial ankle joint: Secondary | ICD-10-CM | POA: Diagnosis not present

## 2019-01-10 ENCOUNTER — Other Ambulatory Visit: Payer: Self-pay | Admitting: Family Medicine

## 2019-01-11 ENCOUNTER — Other Ambulatory Visit: Payer: Self-pay | Admitting: Family Medicine

## 2019-01-11 DIAGNOSIS — Z1231 Encounter for screening mammogram for malignant neoplasm of breast: Secondary | ICD-10-CM

## 2019-01-21 DIAGNOSIS — M19072 Primary osteoarthritis, left ankle and foot: Secondary | ICD-10-CM | POA: Diagnosis not present

## 2019-01-21 DIAGNOSIS — Z96662 Presence of left artificial ankle joint: Secondary | ICD-10-CM | POA: Diagnosis not present

## 2019-01-21 DIAGNOSIS — M6702 Short Achilles tendon (acquired), left ankle: Secondary | ICD-10-CM | POA: Diagnosis not present

## 2019-01-24 ENCOUNTER — Encounter: Payer: Self-pay | Admitting: Family Medicine

## 2019-01-24 ENCOUNTER — Ambulatory Visit (INDEPENDENT_AMBULATORY_CARE_PROVIDER_SITE_OTHER): Payer: BC Managed Care – PPO | Admitting: Family Medicine

## 2019-01-24 ENCOUNTER — Other Ambulatory Visit: Payer: Self-pay

## 2019-01-24 VITALS — BP 135/80 | HR 68 | Wt 234.6 lb

## 2019-01-24 DIAGNOSIS — Z13220 Encounter for screening for lipoid disorders: Secondary | ICD-10-CM

## 2019-01-24 DIAGNOSIS — I10 Essential (primary) hypertension: Secondary | ICD-10-CM

## 2019-01-24 NOTE — Patient Instructions (Addendum)
Good to see you today!  Thanks for coming in.  For the mole make an appointment to see me or our Dermatology clinic  Make an appointment to see your urologist and eye doctor  I will call you if your tests are not good.  Otherwise I will send you a letter.  If you do not hear from me with in 2 weeks please call our office.     Working on your [employer] is your major health goal  Come back for a blood pressure check in 6-12 months

## 2019-01-24 NOTE — Assessment & Plan Note (Signed)
BP Readings from Last 3 Encounters:  01/24/19 135/80  06/28/18 129/67  05/29/18 (!) 142/94   At goal.  Will check labs

## 2019-01-24 NOTE — Progress Notes (Signed)
Subjective  [name] is a 53 y.o. female is presenting with the following  HYPERTENSION Disease Monitoring Home BP Monitoring (Severity) not checking Symptoms - Chest pain- no    Dyspnea- no Medications   Compliance-  Daily indapamide and spiro. Lightheadedness-  no  Edema- mild Timing - continuous  Duration - years  ALLERGIC REACTION Had episode of hives over weekend.  Used benadryl.  Is much improved.  No shortness of breath or lip swelling or lightheadness.  Thinks may be due to new floor in her house.    Does not exercise reguarly due to recent ankle replacement.  Plans to restart when can   Patient reports no  vision/ hearing changes,anorexia, weight change, fever ,adenopathy, persistant / recurrent hoarseness, swallowing issues, chest pain, edema,persistant / recurrent cough, hemoptysis, dyspnea(rest, exertional, paroxysmal nocturnal), gastrointestinal  bleeding (melena, rectal bleeding), abdominal pain, excessive heart burn, GU symptoms(dysuria, hematuria, pyuria, voiding/incontinence  Issues) syncope, focal weakness, severe memory loss, concerning skin lesions, depression, anxiety, abnormal bruising/bleeding, breast masses or abnormal vaginal bleeding.    Chief Complaint noted Review of Symptoms - see HPI PMH - Smoking status noted.    Objective Vital Signs reviewed BP 135/80   Pulse 68   Wt 234 lb 9.6 oz (106.4 kg)   SpO2 96%   BMI 35.67 kg/m  Heart - Regular rate and rhythm.  No murmurs, gallops or rubs.    Lungs:  Normal respiratory effort, chest expands symmetrically. Lungs are clear to auscultation, no crackles or wheezes. Neck:  No deformities, thyromegaly, masses, or tenderness noted.   Supple with full range of motion without pain. Abdomen: soft and non-tender without masses, organomegaly or hernias noted.  No guarding or rebound Extrem - boot on L leg.  No edema on R   Assessments/Plans  Normal exam  HYPERTENSION, BENIGN SYSTEMIC BP Readings from Last 3  Encounters:  01/24/19 135/80  06/28/18 129/67  05/29/18 (!) 142/94   At goal.  Will check labs    See after visit summary for details of patient instructions

## 2019-01-25 ENCOUNTER — Encounter: Payer: Self-pay | Admitting: Family Medicine

## 2019-01-25 LAB — BASIC METABOLIC PANEL
BUN/Creatinine Ratio: 12 (ref 9–23)
BUN: 11 mg/dL (ref 6–24)
CO2: 24 mmol/L (ref 20–29)
Calcium: 9.6 mg/dL (ref 8.7–10.2)
Chloride: 103 mmol/L (ref 96–106)
Creatinine, Ser: 0.91 mg/dL (ref 0.57–1.00)
GFR calc Af Amer: 83 mL/min/{1.73_m2} (ref >59)
GFR calc non Af Amer: 72 mL/min/{1.73_m2} (ref >59)
Glucose: 111 mg/dL — ABNORMAL HIGH (ref 65–99)
Potassium: 3.8 mmol/L (ref 3.5–5.2)
Sodium: 140 mmol/L (ref 134–144)

## 2019-01-25 LAB — LIPID PANEL
Chol/HDL Ratio: 4.6 ratio — ABNORMAL HIGH (ref 0.0–4.4)
Cholesterol, Total: 172 mg/dL (ref 100–199)
HDL: 37 mg/dL — ABNORMAL LOW (ref >39)
LDL Chol Calc (NIH): 110 mg/dL — ABNORMAL HIGH (ref 0–99)
Triglycerides: 138 mg/dL (ref 0–149)
VLDL Cholesterol Cal: 25 mg/dL (ref 5–40)

## 2019-02-15 ENCOUNTER — Encounter (HOSPITAL_COMMUNITY): Payer: Self-pay | Admitting: Emergency Medicine

## 2019-02-15 ENCOUNTER — Other Ambulatory Visit: Payer: Self-pay

## 2019-02-15 ENCOUNTER — Emergency Department (HOSPITAL_COMMUNITY): Payer: BC Managed Care – PPO

## 2019-02-15 ENCOUNTER — Emergency Department (HOSPITAL_COMMUNITY)
Admission: EM | Admit: 2019-02-15 | Discharge: 2019-02-15 | Disposition: A | Discharge status: Home / Self Care | Payer: BC Managed Care – PPO | Attending: Emergency Medicine | Admitting: Emergency Medicine

## 2019-02-15 DIAGNOSIS — S39012A Strain of muscle, fascia and tendon of lower back, initial encounter: Secondary | ICD-10-CM | POA: Diagnosis not present

## 2019-02-15 DIAGNOSIS — Y9389 Activity, other specified: Secondary | ICD-10-CM | POA: Diagnosis not present

## 2019-02-15 DIAGNOSIS — M5430 Sciatica, unspecified side: Secondary | ICD-10-CM | POA: Insufficient documentation

## 2019-02-15 DIAGNOSIS — I10 Essential (primary) hypertension: Secondary | ICD-10-CM | POA: Diagnosis not present

## 2019-02-15 DIAGNOSIS — S3992XA Unspecified injury of lower back, initial encounter: Secondary | ICD-10-CM | POA: Diagnosis not present

## 2019-02-15 DIAGNOSIS — Z79899 Other long term (current) drug therapy: Secondary | ICD-10-CM | POA: Insufficient documentation

## 2019-02-15 DIAGNOSIS — Y92410 Unspecified street and highway as the place of occurrence of the external cause: Secondary | ICD-10-CM | POA: Insufficient documentation

## 2019-02-15 DIAGNOSIS — Z96662 Presence of left artificial ankle joint: Secondary | ICD-10-CM | POA: Insufficient documentation

## 2019-02-15 DIAGNOSIS — M545 Low back pain: Secondary | ICD-10-CM | POA: Diagnosis not present

## 2019-02-15 DIAGNOSIS — Z7982 Long term (current) use of aspirin: Secondary | ICD-10-CM | POA: Insufficient documentation

## 2019-02-15 DIAGNOSIS — Y999 Unspecified external cause status: Secondary | ICD-10-CM | POA: Insufficient documentation

## 2019-02-15 MED ORDER — TRAMADOL HCL 50 MG PO TABS: 50.0000 mg | ORAL_TABLET | Freq: Four times a day (QID) | ORAL | 0 refills | Status: DC | PRN

## 2019-02-15 MED ORDER — CYCLOBENZAPRINE HCL 10 MG PO TABS: 10.0000 mg | ORAL_TABLET | Freq: Every day | ORAL | 0 refills | Status: DC

## 2019-02-15 NOTE — ED Notes (Signed)
Patient transported to X-ray 
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
## 2019-02-15 NOTE — Discharge Instructions (Signed)
Return here as needed.  Your x-rays did not show any abnormalities at this time.  Follow-up with your primary doctor.
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
## 2019-02-15 NOTE — ED Notes (Signed)
Patient verbalizes understanding of discharge instructions. Opportunity for questioning and answers were provided. Armband removed by staff, pt discharged from ED.  

## 2019-02-18 DIAGNOSIS — M6702 Short Achilles tendon (acquired), left ankle: Secondary | ICD-10-CM | POA: Diagnosis not present

## 2019-02-18 DIAGNOSIS — M19072 Primary osteoarthritis, left ankle and foot: Secondary | ICD-10-CM | POA: Diagnosis not present

## 2019-02-18 DIAGNOSIS — T84039D Mechanical loosening of unspecified internal prosthetic joint, subsequent encounter: Secondary | ICD-10-CM | POA: Diagnosis not present

## 2019-02-18 DIAGNOSIS — Z96662 Presence of left artificial ankle joint: Secondary | ICD-10-CM | POA: Diagnosis not present

## 2019-02-25 ENCOUNTER — Ambulatory Visit
Admission: RE | Admit: 2019-02-25 | Discharge: 2019-02-25 | Disposition: A | Discharge status: Home / Self Care | Payer: BC Managed Care – PPO | Source: Ambulatory Visit | Attending: Family Medicine | Admitting: Family Medicine

## 2019-02-25 ENCOUNTER — Other Ambulatory Visit: Payer: Self-pay

## 2019-02-25 DIAGNOSIS — Z1231 Encounter for screening mammogram for malignant neoplasm of breast: Secondary | ICD-10-CM

## 2019-03-12 ENCOUNTER — Other Ambulatory Visit: Payer: Self-pay | Admitting: Family Medicine

## 2019-04-05 DIAGNOSIS — Z9189 Other specified personal risk factors, not elsewhere classified: Secondary | ICD-10-CM | POA: Diagnosis not present

## 2019-04-05 DIAGNOSIS — U071 COVID-19: Secondary | ICD-10-CM | POA: Diagnosis not present

## 2019-05-08 DIAGNOSIS — N281 Cyst of kidney, acquired: Secondary | ICD-10-CM | POA: Diagnosis not present

## 2019-05-29 DIAGNOSIS — Z96662 Presence of left artificial ankle joint: Secondary | ICD-10-CM | POA: Diagnosis not present

## 2019-05-29 DIAGNOSIS — M6702 Short Achilles tendon (acquired), left ankle: Secondary | ICD-10-CM | POA: Diagnosis not present

## 2019-05-29 DIAGNOSIS — M19072 Primary osteoarthritis, left ankle and foot: Secondary | ICD-10-CM | POA: Diagnosis not present

## 2019-06-10 DIAGNOSIS — Z20822 Contact with and (suspected) exposure to covid-19: Secondary | ICD-10-CM | POA: Diagnosis not present

## 2019-07-06 ENCOUNTER — Other Ambulatory Visit: Payer: Self-pay | Admitting: Family Medicine

## 2019-09-05 ENCOUNTER — Other Ambulatory Visit: Payer: Self-pay | Admitting: Family Medicine

## 2019-09-29 ENCOUNTER — Other Ambulatory Visit: Payer: Self-pay | Admitting: Family Medicine

## 2019-10-29 ENCOUNTER — Other Ambulatory Visit: Payer: Self-pay

## 2019-10-29 ENCOUNTER — Encounter: Payer: Self-pay | Admitting: Family Medicine

## 2019-10-29 ENCOUNTER — Ambulatory Visit (INDEPENDENT_AMBULATORY_CARE_PROVIDER_SITE_OTHER): Payer: BC Managed Care – PPO | Admitting: Family Medicine

## 2019-10-29 DIAGNOSIS — R5383 Other fatigue: Secondary | ICD-10-CM | POA: Insufficient documentation

## 2019-10-29 DIAGNOSIS — I10 Essential (primary) hypertension: Secondary | ICD-10-CM

## 2019-10-29 NOTE — Assessment & Plan Note (Signed)
Broad differential.  Does not seem to be depression but will explore more next visit.  Does not seem to be hypotension.  Check labs and monitor blood pressure

## 2019-10-29 NOTE — Patient Instructions (Addendum)
Good to see you today!  Thanks for coming in.  For the Fatigue and Dizziness  Will check several blood tests to look for diabetes and  anemia and liver kidney problems  For the Blood pressure   Check your blood pressure 1-2 x a day and write down the readings  Take one blood pressure medication in the AM and one in the PM   Make an appointment to come back in 10-14 days  Bring all medications  And your blood pressure cuff   I will call you if your lab tests are not normal.  Otherwise we will discuss them at your next visit.

## 2019-10-29 NOTE — Progress Notes (Signed)
    SUBJECTIVE:   CHIEF COMPLAINT / HPI:   HYPERTENSION Home BP Monitoring readings: not checking  Chest pain- no     Medication Adherence  daily. Lightheadedness-  Yes see below  Edema- no  FATIGUE LIGHTHEADEDNESS  Feeling much more tired than usual for several months.  Associated with lightheadness sometimes when stands up.  Vertigo spinning has decreased since last year. No fever or weight loss or focal weakness or visual loss.  Has been under stress with brother's CVA and drug addiction but this is improved     PERTINENT  PMH / PSH: ductal aplasia strong fhx diabetes   OBJECTIVE:   BP 126/70   Pulse 70   Ht 5\' 8"  (1.727 m)   Wt 230 lb 6.4 oz (104.5 kg)   SpO2 98%   BMI 35.03 kg/m   Neurologic exam : Cn 2-7 intact Strength equal & normal in upper & lower extremities Balance normal  Romberg normal, finger to nose normal Eye - Pupils Equal Round Reactive to light, Extraocular movements intact, Fundi without hemorrhage or visible lesions, Conjunctiva without redness or discharge Heart - Regular rate and rhythm.  No murmurs, gallops or rubs.    Lungs:  Normal respiratory effort, chest expands symmetrically. Lungs are clear to auscultation, no crackles or wheezes. Mobility:able to get up and down from exam table without assistance or distress   ASSESSMENT/PLAN:   Fatigue Broad differential.  Does not seem to be depression but will explore more next visit.  Does not seem to be hypotension.  Check labs and monitor blood pressure   HYPERTENSION, BENIGN SYSTEMIC Unusual orthostatic blood pressure response does not indicate over treatment.  Will check labs and monitor at home      Lind Covert, [place]

## 2019-10-29 NOTE — Assessment & Plan Note (Signed)
Unusual orthostatic blood pressure response does not indicate over treatment.  Will check labs and monitor at home

## 2019-10-30 ENCOUNTER — Other Ambulatory Visit: Payer: Self-pay | Admitting: Family Medicine

## 2019-10-30 LAB — CBC
Hematocrit: 36.3 % (ref 34.0–46.6)
Hemoglobin: 12.3 g/dL (ref 11.1–15.9)
MCH: 26.6 pg (ref 26.6–33.0)
MCHC: 33.9 g/dL (ref 31.5–35.7)
MCV: 79 fL (ref 79–97)
Platelets: 272 10*3/uL (ref 150–450)
RBC: 4.62 x10E6/uL (ref 3.77–5.28)
RDW: 14.6 % (ref 11.7–15.4)
WBC: 5.3 10*3/uL (ref 3.4–10.8)

## 2019-10-30 LAB — CMP14+EGFR
ALT: 21 IU/L (ref 0–32)
AST: 20 IU/L (ref 0–40)
Albumin/Globulin Ratio: 1.2 (ref 1.2–2.2)
Albumin: 4 g/dL (ref 3.8–4.9)
Alkaline Phosphatase: 90 IU/L (ref 48–121)
BUN/Creatinine Ratio: 15 (ref 9–23)
BUN: 13 mg/dL (ref 6–24)
Bilirubin Total: 0.4 mg/dL (ref 0.0–1.2)
CO2: 27 mmol/L (ref 20–29)
Calcium: 9.7 mg/dL (ref 8.7–10.2)
Chloride: 102 mmol/L (ref 96–106)
Creatinine, Ser: 0.86 mg/dL (ref 0.57–1.00)
GFR calc Af Amer: 88 mL/min/{1.73_m2} (ref >59)
GFR calc non Af Amer: 76 mL/min/{1.73_m2} (ref >59)
Globulin, Total: 3.4 g/dL (ref 1.5–4.5)
Glucose: 104 mg/dL — ABNORMAL HIGH (ref 65–99)
Potassium: 3.6 mmol/L (ref 3.5–5.2)
Sodium: 138 mmol/L (ref 134–144)
Total Protein: 7.4 g/dL (ref 6.0–8.5)

## 2019-10-30 LAB — TSH: TSH: 1.48 u[IU]/mL (ref 0.450–4.500)

## 2019-10-31 MED ORDER — INDAPAMIDE 1.25 MG PO TABS: 1.2500 mg | ORAL_TABLET | Freq: Every day | ORAL | 2 refills | Status: DC

## 2019-11-12 ENCOUNTER — Encounter: Payer: Self-pay | Admitting: Family Medicine

## 2019-11-12 ENCOUNTER — Ambulatory Visit: Payer: BC Managed Care – PPO | Admitting: Family Medicine

## 2019-11-12 ENCOUNTER — Other Ambulatory Visit: Payer: Self-pay

## 2019-11-12 DIAGNOSIS — I10 Essential (primary) hypertension: Secondary | ICD-10-CM | POA: Diagnosis not present

## 2019-11-12 DIAGNOSIS — R5383 Other fatigue: Secondary | ICD-10-CM

## 2019-11-12 MED ORDER — AMLODIPINE BESYLATE 2.5 MG PO TABS: 2.5000 mg | ORAL_TABLET | Freq: Every day | ORAL | 0 refills | Status: DC

## 2019-11-12 NOTE — Assessment & Plan Note (Signed)
Improved splitting taking her anti-hypertension medications indapamide at night

## 2019-11-12 NOTE — Patient Instructions (Addendum)
Good to see you today!  Thanks for coming in.   Continue taking the blood pressure medications morning and night as you are  Add amlodipine 2.5 mg once a day  Call me with any problems or if your blood pressure is regularly > 140/90 or if you have dizziness   Keep checking your blood pressure   Continue to work on weight control

## 2019-11-12 NOTE — Progress Notes (Signed)
    SUBJECTIVE:   CHIEF COMPLAINT / HPI:   HYPERTENSION Home BP Monitoring readings: systolic rarely > 568  Diastolic > 90 about 1/3 of time hest pain- no     Medication Adherence  Taking indapamide at night and spiro in am . Lightheadedness-  None since last visitEdema- no  FATIGUE LIGHTHEADEDNESS  Feeling much improved.  No episodes of lightheadness or severe fatigue althouth was on vacation all last week.  No fever or weight loss or focal weakness or visual loss.    OBJECTIVE:   BP (!) 148/82   Pulse 89   Wt 228 lb 9.6 oz (103.7 kg)   SpO2 96%   BMI 34.76 kg/m   On my manual check 147/96 on her machine 147/100   ASSESSMENT/PLAN:   Fatigue Improved splitting taking her anti-hypertension medications indapamide at night  HYPERTENSION, BENIGN SYSTEMIC Not at goal about 1/3 of time at home.  Discussed approaches decided to add low dose amlodipine along with continued weight loss and monitor at  [place], [place]

## 2019-11-12 NOTE — Assessment & Plan Note (Signed)
Not at goal about 1/3 of time at home.  Discussed approaches decided to add low dose amlodipine along with continued weight loss and monitor at  [facility]

## 2020-01-07 DIAGNOSIS — M19072 Primary osteoarthritis, left ankle and foot: Secondary | ICD-10-CM | POA: Diagnosis not present

## 2020-01-07 DIAGNOSIS — M7752 Other enthesopathy of left foot: Secondary | ICD-10-CM | POA: Diagnosis not present

## 2020-01-07 DIAGNOSIS — M25572 Pain in left ankle and joints of left foot: Secondary | ICD-10-CM | POA: Diagnosis not present

## 2020-01-07 DIAGNOSIS — Z96662 Presence of left artificial ankle joint: Secondary | ICD-10-CM | POA: Diagnosis not present

## 2020-01-28 ENCOUNTER — Other Ambulatory Visit: Payer: Self-pay | Admitting: Family Medicine

## 2020-01-28 DIAGNOSIS — Z1231 Encounter for screening mammogram for malignant neoplasm of breast: Secondary | ICD-10-CM

## 2020-02-03 ENCOUNTER — Other Ambulatory Visit: Payer: Self-pay | Admitting: Family Medicine

## 2020-02-08 ENCOUNTER — Other Ambulatory Visit: Payer: Self-pay | Admitting: Family Medicine

## 2020-02-26 ENCOUNTER — Ambulatory Visit
Admission: RE | Admit: 2020-02-26 | Discharge: 2020-02-26 | Disposition: A | Discharge status: Home / Self Care | Payer: BC Managed Care – PPO | Source: Ambulatory Visit | Attending: Family Medicine | Admitting: Family Medicine

## 2020-02-26 ENCOUNTER — Encounter: Payer: Self-pay | Admitting: Family Medicine

## 2020-02-26 ENCOUNTER — Ambulatory Visit (INDEPENDENT_AMBULATORY_CARE_PROVIDER_SITE_OTHER): Payer: BC Managed Care – PPO | Admitting: Family Medicine

## 2020-02-26 ENCOUNTER — Other Ambulatory Visit: Payer: Self-pay

## 2020-02-26 DIAGNOSIS — I10 Essential (primary) hypertension: Secondary | ICD-10-CM

## 2020-02-26 DIAGNOSIS — M79672 Pain in left foot: Secondary | ICD-10-CM | POA: Diagnosis not present

## 2020-02-26 DIAGNOSIS — Z1231 Encounter for screening mammogram for malignant neoplasm of breast: Secondary | ICD-10-CM

## 2020-02-26 NOTE — Assessment & Plan Note (Signed)
Not at goal by home readings.  Asked to bring in readings and blood pressure meter to check.  Limited options given history of angioedema with ARB, baseline bradycardia and edema with low dose amlodipine.  May want to increase indapamide

## 2020-02-26 NOTE — Progress Notes (Signed)
° ° °  SUBJECTIVE:   CHIEF COMPLAINT / HPI:   Here for physical required by work.  Feels ok except for   L FOOT PAIN - since had multiple surgeries on her ankle.  Ibuprofen helps but wants to limit due to kidney issues and blood pressure.  Is being followed by podiatry.    HYPERTENSION  At home readings diastolic in 46N-629B.  Low dose amlodipine caused swelling in her L ankle.  History of angioedema while on ARB   WEIGHT Has been making dietary changes but hard to be consistent   PERTINENT  PMH / PSH: Never smoker does not drink.  No regular exercise due to ankle limitations   OBJECTIVE:   BP (!) 144/78    Pulse 62    Ht 5\' 8"  (1.727 m)    Wt 231 lb 6.4 oz (105 kg)    SpO2 97%    BMI 35.18 kg/m   Heart - Regular rate and rhythm.  No murmurs, gallops or rubs.    Lungs:  Normal respiratory effort, chest expands symmetrically. Lungs are clear to auscultation, no crackles or wheezes. Abdomen: soft and non-tender without masses, organomegaly or hernias noted.  No guarding or rebound Skin:  Intact without suspicious lesions or rashes Extremities:  No cyanosis, edema, Walks with limp due to fused L ankle   ASSESSMENT/PLAN:   HYPERTENSION, BENIGN SYSTEMIC Not at goal by home readings.  Asked to bring in readings and blood pressure meter to check.  Limited options given history of angioedema with ARB, baseline bradycardia and edema with low dose amlodipine.  May want to increase indapamide  Left foot pain Post surgical pain.  Suggest follow up with podiatry.  May want to retry gabapentin    HM - has mammogram scheduled.  See after visit summary for diet suggestions Lab Results  Component Value Date   LDLCALC 110 (H) 01/24/2019      Lind Covert, MD [place]

## 2020-02-26 NOTE — Patient Instructions (Addendum)
Good to see you today!  Thanks for coming in.  Try aspercreme at least 4 x a day for a week to see if helps  Your goal blood pressure is less than 140/85.  Check your blood pressure several times a week.  If regularly higher than this please let me know - either with MyChart or leaving a phone message. Next visit please bring in your blood pressure cuff.    You need to control your weight! Drink water and other unsweetened drinks like coffee, milk, tea- No sweet drinks Eat smaller portions of starchy and sweet foods like rice, potatoes, wheat, corn, or fruits.  Eat twice as many vegetables  Exercise at least 30 minutes every day If you wish to discuss weight loss further, please let me know or make an appointment   Make an appointment to look at your bp

## 2020-02-26 NOTE — Assessment & Plan Note (Signed)
Post surgical pain.  Suggest follow up with podiatry.  May want to retry gabapentin

## 2020-02-28 IMAGING — MG DIGITAL SCREENING BILAT W/ TOMO
8 series · 8 of 24 positions shown · non-contrast
Comparison: Previous exam(s).

CLINICAL DATA: Screening.

EXAM:
DIGITAL SCREENING BILATERAL MAMMOGRAM WITH TOMO AND CAD

[R MLO synth-2D]
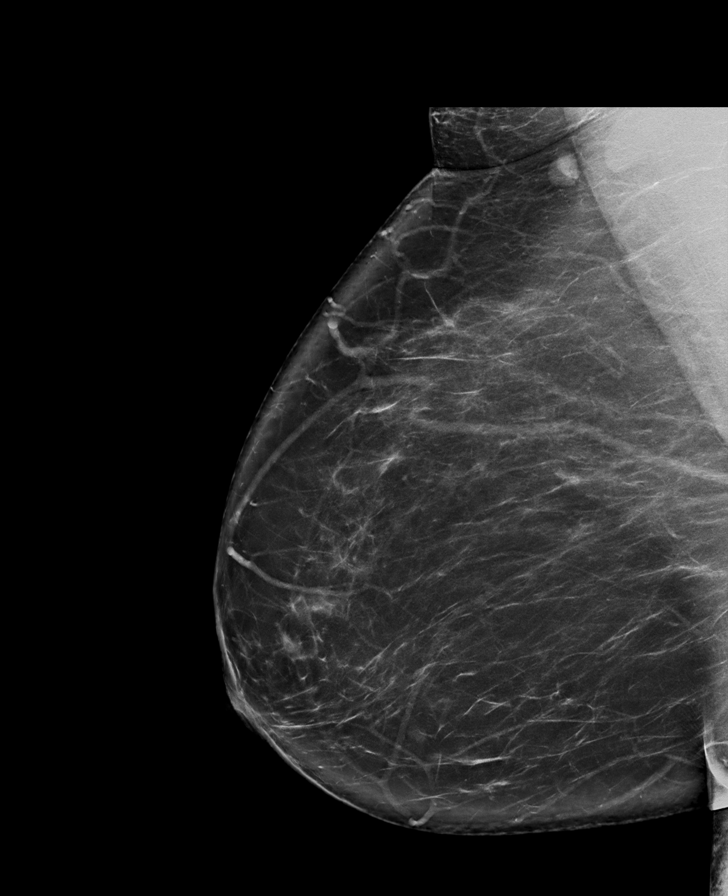

[L MLO synth-2D]
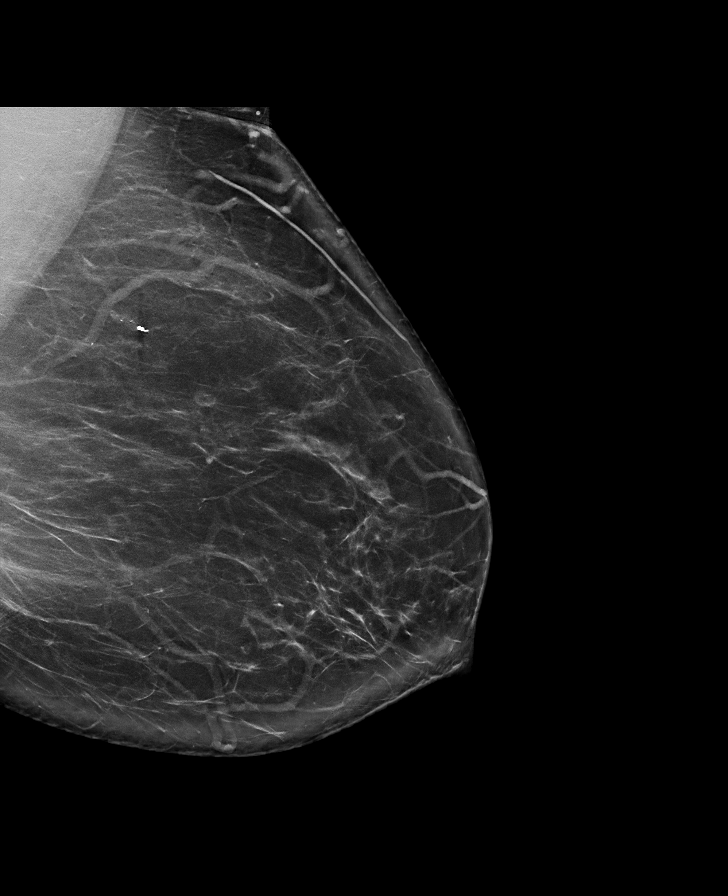

[L CC synth-2D]
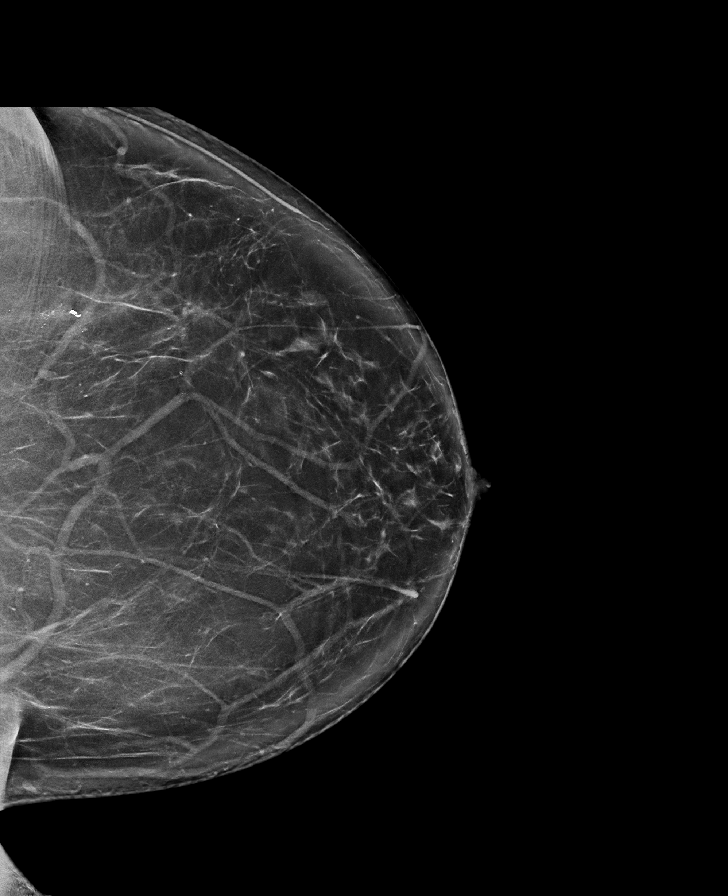

[R CC synth-2D]
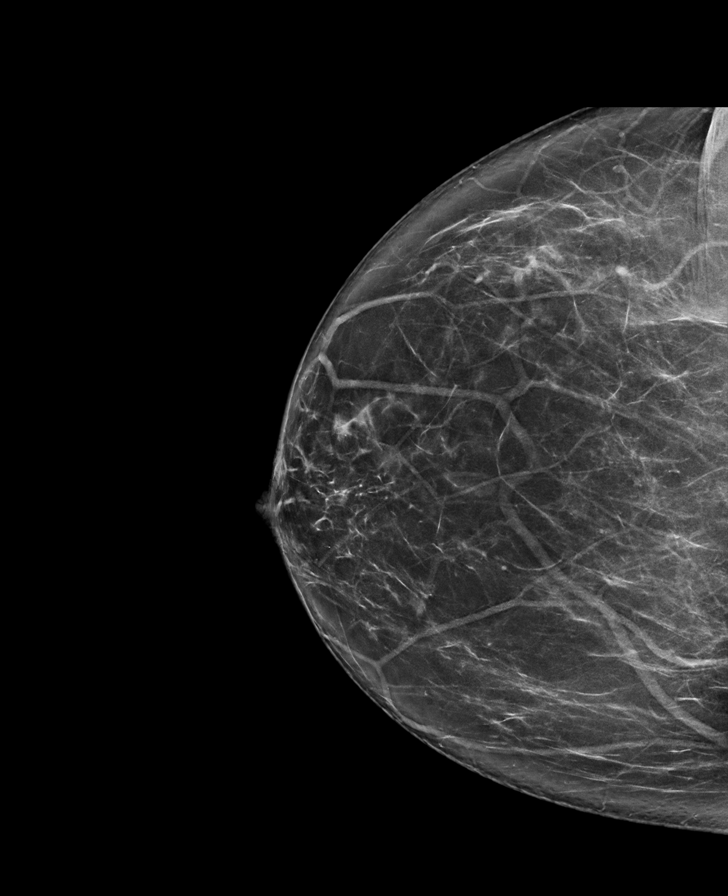

[R MLO tomo · tomo slice 51/101.0]
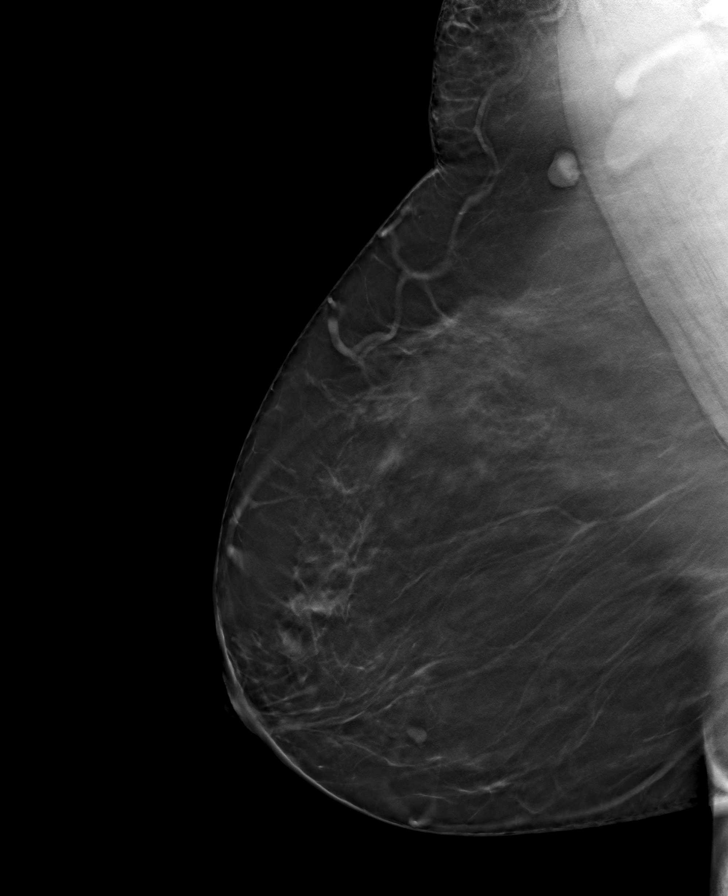

[L CC tomo · tomo slice 43/86.0]
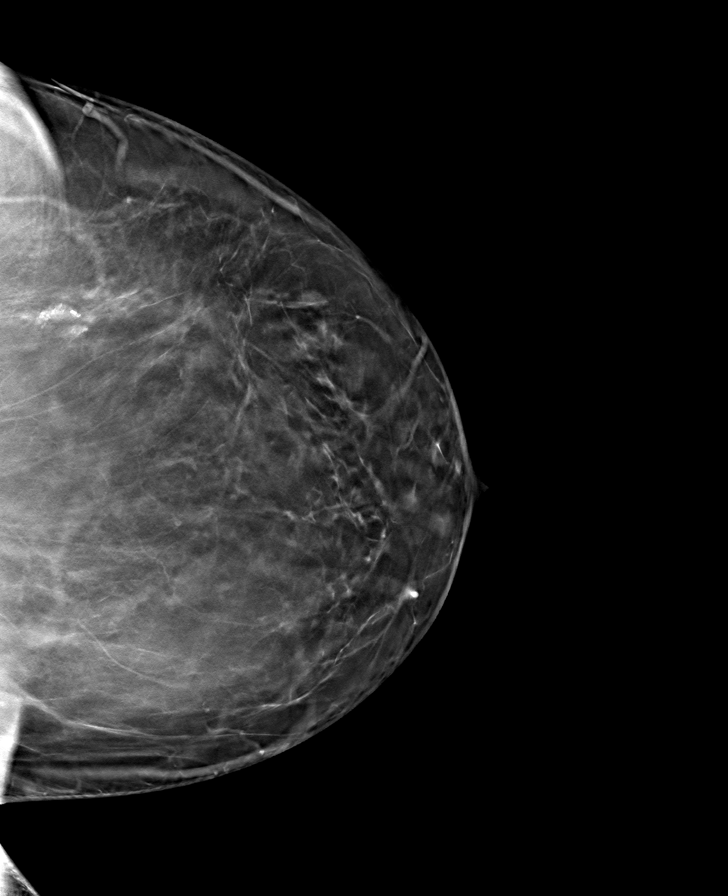

[R CC tomo · tomo slice 43/84.0]
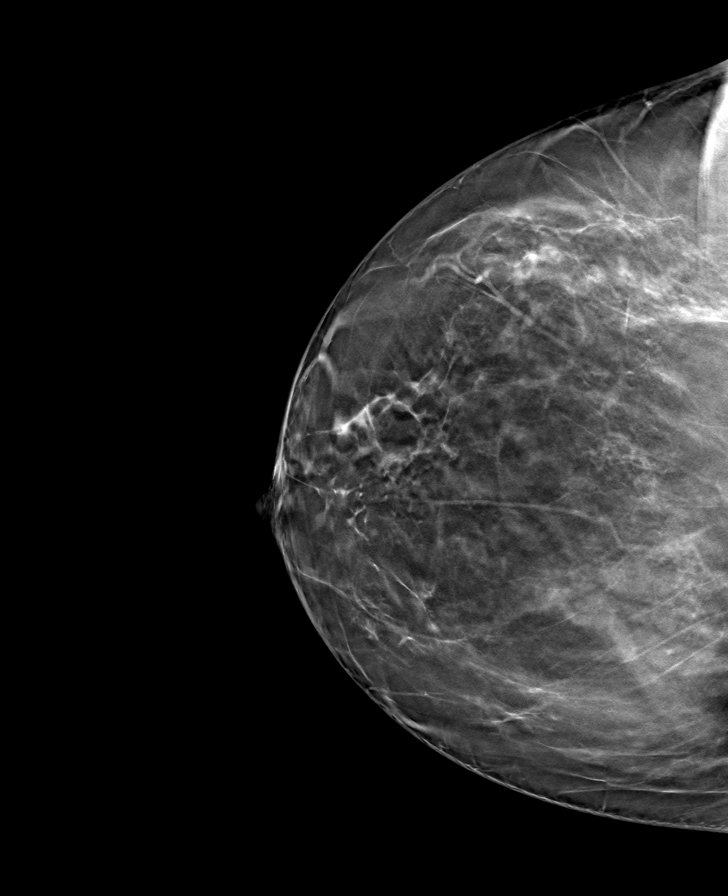

[L MLO tomo · tomo slice 49/96.0]
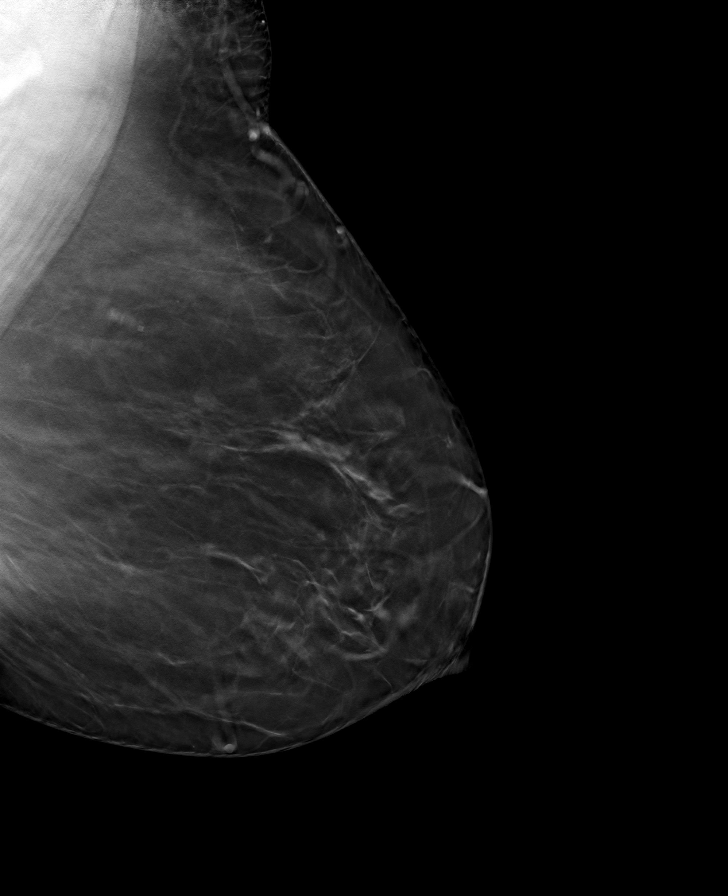

[8 of 24 positions shown; findings below may reference images not displayed]

ACR Breast Density Category b: There are scattered areas of
fibroglandular density.
FINDINGS: There are no findings suspicious for malignancy. Images were
processed with CAD.
IMPRESSION: No mammographic evidence of malignancy. A result letter of this
screening mammogram will be mailed directly to the patient.

RECOMMENDATION:
Screening mammogram in one year. (Code:[id number])

BI-RADS CATEGORY  1: Negative.

## 2020-03-10 DIAGNOSIS — Z96662 Presence of left artificial ankle joint: Secondary | ICD-10-CM | POA: Diagnosis not present

## 2020-03-10 DIAGNOSIS — M6702 Short Achilles tendon (acquired), left ankle: Secondary | ICD-10-CM | POA: Diagnosis not present

## 2020-03-10 DIAGNOSIS — M19072 Primary osteoarthritis, left ankle and foot: Secondary | ICD-10-CM | POA: Diagnosis not present

## 2020-03-10 DIAGNOSIS — M25572 Pain in left ankle and joints of left foot: Secondary | ICD-10-CM | POA: Diagnosis not present

## 2020-04-30 ENCOUNTER — Ambulatory Visit (INDEPENDENT_AMBULATORY_CARE_PROVIDER_SITE_OTHER): Payer: BC Managed Care – PPO | Admitting: Podiatry

## 2020-04-30 ENCOUNTER — Telehealth: Payer: Self-pay | Admitting: Podiatry

## 2020-04-30 ENCOUNTER — Other Ambulatory Visit: Payer: Self-pay

## 2020-04-30 ENCOUNTER — Encounter: Payer: Self-pay | Admitting: Podiatry

## 2020-04-30 ENCOUNTER — Ambulatory Visit (INDEPENDENT_AMBULATORY_CARE_PROVIDER_SITE_OTHER): Payer: BC Managed Care – PPO

## 2020-04-30 DIAGNOSIS — M722 Plantar fascial fibromatosis: Secondary | ICD-10-CM | POA: Diagnosis not present

## 2020-04-30 DIAGNOSIS — M79671 Pain in right foot: Secondary | ICD-10-CM | POA: Diagnosis not present

## 2020-04-30 MED ORDER — DEXAMETHASONE SODIUM PHOSPHATE 4 MG/ML IJ SOLN
2.0000 mg | Freq: Once | INTRAMUSCULAR | Status: AC
  Administered 2020-04-30: 2 mg

## 2020-04-30 MED ORDER — MELOXICAM 15 MG PO TABS: 15.0000 mg | ORAL_TABLET | Freq: Every day | ORAL | 3 refills | Status: DC

## 2020-04-30 MED ORDER — TRIAMCINOLONE ACETONIDE 10 MG/ML IJ SUSP
5.0000 mg | Freq: Once | INTRAMUSCULAR | Status: AC
  Administered 2020-04-30: 5 mg

## 2020-04-30 NOTE — Progress Notes (Signed)
  Subjective:  Patient ID: [name], female    DOB: 09-28-1965,  MRN: [id number]  Chief Complaint  Patient presents with  . Foot Pain    Right heel pain for about a month.     54 y.o. female presents with the above complaint. History confirmed with patient.  Previously experienced this in 2015 and she saw Dr. Donzetta Kohut for this.  She had been using a night splint, stretching exercise at home anti-inflammatories.  They had plan for plantar fascial release and spur resection, however prior to this she broke her left ankle.  She has been dealing with left ankle ever since with multiple surgeries including ankle replacement, revision to ankle arthrodesis following explant, as well as subtalar joint fusion to realign the heel.  Most of these were done at [facility].  Her right heel pain has returned and feels like the same plantar fasciitis.  She would like to try injections and stretching again before proceeding to surgery.  Objective:  Physical Exam: warm, good capillary refill, no trophic changes or ulcerative lesions, normal DP and PT pulses and normal sensory exam.   Right Foot: point tenderness over the heel pad   No images are attached to the encounter.  Radiographs: X-ray of the right foot: Significant enthesophyte formation of the plantar calcaneus and small 1 on the posterior Assessment:   1. Right foot pain   2. Plantar fasciitis, right      Plan:  Patient was evaluated and treated and all questions answered.   Discussed the etiology and treatment options for plantar fasciitis including stretching, formal physical therapy, supportive shoegears such as a running shoe or sneaker, pre fabricated orthoses, injection therapy, and oral medications. We also discussed the role of surgical treatment of this for patients who do not improve after exhausting non-surgical treatment options.    -XR reviewed with patient -Educated patient on stretching and icing of the affected  limb -Night splint dispensed -Plantar fascial brace dispensed -Injection delivered to the plantar fascia of the right foot. -Rx for meloxicam. Educated on use, risks and benefits of the medication   After sterile prep with povidone-iodine solution and alcohol, the right heel was injected with 0.5cc 2% xylocaine plain, 0.5cc 0.5% marcaine plain, 5mg  triamcinolone acetonide, and 2mg  dexamethasone was injected along  the plantar fascia at the insertion on the plantar calcaneus. The patient tolerated the procedure well without complication.   Return in about 6 weeks (around 06/11/2020).

## 2020-04-30 NOTE — Telephone Encounter (Signed)
Please send patient [name] to pharmacy, she was seen today

## 2020-05-04 ENCOUNTER — Encounter: Payer: Self-pay | Admitting: Podiatry

## 2020-05-04 ENCOUNTER — Ambulatory Visit (INDEPENDENT_AMBULATORY_CARE_PROVIDER_SITE_OTHER): Payer: BC Managed Care – PPO | Admitting: Podiatry

## 2020-05-04 ENCOUNTER — Other Ambulatory Visit: Payer: Self-pay

## 2020-05-04 DIAGNOSIS — M79671 Pain in right foot: Secondary | ICD-10-CM | POA: Diagnosis not present

## 2020-05-04 DIAGNOSIS — M722 Plantar fascial fibromatosis: Secondary | ICD-10-CM

## 2020-05-04 NOTE — Patient Instructions (Signed)

## 2020-05-05 ENCOUNTER — Other Ambulatory Visit: Payer: Self-pay | Admitting: Family Medicine

## 2020-05-05 NOTE — Progress Notes (Signed)
  Subjective:  Patient ID: [name], female    DOB: 1965/09/24,  MRN: [id number]  Chief Complaint  Patient presents with  . Plantar Fasciitis    "Its doing a little better. The meloxicam, brace and splint is helping"    55 y.o. female returns with the above complaint. History confirmed with patient.  Says she is about 90% better.  The meloxicam has been very helpful.  The injection was helpful as well  Objective:  Physical Exam: warm, good capillary refill, no trophic changes or ulcerative lesions, normal DP and PT pulses and normal sensory exam.   Right Foot: Minimal point tenderness over the heel pad   No images are attached to the encounter.  Radiographs: X-ray of the right foot: Significant enthesophyte formation of the plantar calcaneus and small 1 on the posterior Assessment:   1. Right foot pain   2. Plantar fasciitis, right      Plan:  Patient was evaluated and treated and all questions answered.    -Continue stretching and icing of the affected limb -Continue use of night splint  -Continue wearing plantar fascial brace  -Continue use of meloxicam. Educated on use, risks and benefits of the medication   After sterile prep with povidone-iodine solution and alcohol, the right heel was injected with 0.5cc 2% xylocaine plain, 0.5cc 0.5% marcaine plain, 5mg  triamcinolone acetonide, and 2mg  dexamethasone was injected along  the plantar fascia at the insertion on the plantar calcaneus. The patient tolerated the procedure well without complication.   Return in about 4 weeks (around 06/01/2020) for recheck plantar fasciitis.

## 2020-05-12 NOTE — Progress Notes (Signed)
Cardiology Office Note   Date:  05/15/2020   ID:  Danel Studzinski, DOB 27-Feb-1966, MRN [id number]  PCP:  Lind Covert, MD  Cardiologist:   Jenkins Rouge, MD   No chief complaint on file.     History of Present Illness: [name] is a 55 y.o. female who presents for f/u  dizziness and bradycardia. Referred by Dr Lindell Noe. She has had recent dizziness and nausea  Worse starting after Thanksgiving. Can occur laying down last Hours usually accompanied by nausea. Previous hypokalemia with these feelings K 3.8 04/26/18 TSH and Hct also  Normal History of endometrial ablation and saw DR Erin Hearing recently he thought symptoms consistent with BPV And suggested antivert. I saw her in 2014 for benign palpitations with normal holter and TTE normal EF only mild AR Seen again in February 2017 and had normal lexiscan myovue  Activity is limited by previous right ankle replacement And plantar fascitis   She has no chest pain , dyspnea or syncope   She has angioedema with ARB, baseline bradycardia and edema with low dose amlodipine   Has had 3 surgeries on her left ankle Had undiscovered nickel allergy and had to have replacement explanted With fusion   Past Medical History:  Diagnosis Date  . Allergy   . Arthritis    Lt ankle  . Chronic kidney disease    right renal cyst   . GERD (gastroesophageal reflux disease)    hx of no problems at present   . Headache(784.0)    occasional headache   . Hypertension   . Influenza 04/20/2012  . PONV (postoperative nausea and vomiting)    N/V and trouble urinating after surgery     Past Surgical History:  Procedure Laterality Date  . BREAST EXCISIONAL BIOPSY Left   . BREAST SURGERY  2010   lumpectomy Lt breast  . CARPAL TUNNEL RELEASE Right 08/06/2015   Procedure: RIGHT CARPAL TUNNEL RELEASE;  Surgeon: Leanora Cover, MD;  Location: [place];  Service: Orthopedics;  Laterality: Right;  . DORSAL  COMPARTMENT RELEASE Right 08/06/2015   Procedure: RIGHT FIRST RELEASE DORSAL COMPARTMENT (DEQUERVAIN);  Surgeon: Leanora Cover, MD;  Location: [place];  Service: Orthopedics;  Laterality: Right;  . ENDOMETRIAL ABLATION  October 18 2010   at [facility]  . FRACTURE SURGERY  2011   Lt ankle surgeries x 3   . JOINT REPLACEMENT     left ankle 8/12   . LAPAROSCOPIC NEPHRECTOMY  03/09/2011   Procedure: LAPAROSCOPIC NEPHRECTOMY;  Surgeon: Franchot Gallo;  Location: [facility];  Service: Urology;  Laterality: Right;  Laparoscopic Assisted Removal Of Renal Cyst   . OTHER SURGICAL HISTORY     2 hand surgeries on left and shoulder surgery on right shoulder   . TOTAL ANKLE ARTHROPLASTY Left 07/05/2012   Procedure: Removal of total ankle implants,I & D with insertion of  new poly spacer;  Surgeon: Wylene Simmer, MD;  Location: [employer];  Service: Orthopedics;  Laterality: Left;  . TUBAL LIGATION  1988     Current Outpatient Medications  Medication Sig Dispense Refill  . indapamide (LOZOL) 1.25 MG tablet Take 1.25 mg by mouth daily.    . meloxicam (MOBIC) 15 MG tablet Take 1 tablet (15 mg total) by mouth daily. 30 tablet 3  . spironolactone (ALDACTONE) 25 MG tablet TAKE 1 TABLET BY MOUTH EVERY DAY 90 tablet 1  . vitamin E 400 UNIT capsule Take 400 Units by mouth daily.  No current facility-administered medications for this visit.    Allergies:   Tetracycline hcl, Angiotensin receptor blockers, Nickel, Septra [bactrim], and Sulfamethoxazole-trimethoprim    Social History:  The patient  reports that she has never smoked. She has never used smokeless tobacco. She reports that she does not drink alcohol and does not use drugs.   Family History:  The patient's family history includes Arthritis in her brother; Breast cancer in her niece; Cancer in her maternal grandmother; Diabetes in her mother, sister, and sister; Heart disease in her mother; Hypertension in her brother, brother, father, sister,  sister, and sister; Kidney disease in her brother and sister; Mental illness in her brother.    ROS:  Please see the history of present illness.   Otherwise, review of systems are positive for none.   All other systems are reviewed and negative.    PHYSICAL EXAM: VS:  BP (!) 152/92   Pulse 60   Ht 5\' 8"  (1.727 m)   Wt 103 kg   BMI 34.52 kg/m  , BMI Body mass index is 34.52 kg/m. Affect appropriate Healthy:  appears stated age 54: normal Neck supple with no adenopathy JVP normal no bruits no thyromegaly Lungs clear with no wheezing and good diaphragmatic motion Heart:  S1/S2 no murmur, no rub, gallop or click PMI normal Abdomen: benighn, BS positve, no tenderness, no AAA no bruit.  No HSM or HJR Distal pulses intact with no bruits No edema Neuro non-focal Skin warm and dry Previous left ankle surgery     EKG:  07/28/16 SR rate 71 normal  05/15/2020 SR rate 58 normal    Recent Labs: 10/29/2019: ALT 21; BUN 13; Creatinine, Ser 0.86; Hemoglobin 12.3; Platelets 272; Potassium 3.6; Sodium 138; TSH 1.480    Lipid Panel    Component Value Date/Time   CHOL 172 01/24/2019 0911   TRIG 138 01/24/2019 0911   HDL 37 (L) 01/24/2019 0911   CHOLHDL 4.6 (H) 01/24/2019 0911   CHOLHDL 3.9 04/13/2015 1609   VLDL 31 (H) 04/13/2015 1609   LDLCALC 110 (H) 01/24/2019 0911      Wt Readings from Last 3 Encounters:  05/15/20 103 kg  02/26/20 105 kg  11/12/19 103.7 kg      Other studies Reviewed: Additional studies/ records that were reviewed today include: notes from primary Ob, labs previous cardiology  Notes TTE 2014, holter 2014 myovue 2017.    ASSESSMENT AND PLAN:  1.  Dizziness: most likely from inner ear very low likelihood cardiac etiology no further w/u indicated  2. AR mild in 2014 no murmur on exam observe  3. HTN:f/U primary likely to start Hydralazine or clonidine in addition to her diuretics for better control    Current medicines are reviewed at length with  the patient today.  The patient does not have concerns regarding medicines.  The following changes have been made:  None   Labs/ tests ordered today include:  Refer Rudell Cobb Vestibular PT  Orders Placed This Encounter  Procedures  . EKG 12-Lead     Disposition:   FU with cardiology PRN      Signed, Jenkins Rouge, MD  05/15/2020 10:25 AM    [facility] [address], [place], [place]  [address] Phone: [phone]; Fax: [phone]

## 2020-05-15 ENCOUNTER — Encounter: Payer: Self-pay | Admitting: Cardiovascular Disease

## 2020-05-15 ENCOUNTER — Ambulatory Visit (INDEPENDENT_AMBULATORY_CARE_PROVIDER_SITE_OTHER): Payer: BC Managed Care – PPO | Admitting: Family Medicine

## 2020-05-15 ENCOUNTER — Ambulatory Visit: Payer: BC Managed Care – PPO | Admitting: Cardiovascular Disease

## 2020-05-15 ENCOUNTER — Other Ambulatory Visit: Payer: Self-pay

## 2020-05-15 VITALS — BP 152/92 | HR 60 | Ht 68.0 in | Wt 227.0 lb

## 2020-05-15 VITALS — BP 130/80 | HR 67 | Ht 68.0 in | Wt 231.0 lb

## 2020-05-15 DIAGNOSIS — I10 Essential (primary) hypertension: Secondary | ICD-10-CM

## 2020-05-15 DIAGNOSIS — R42 Dizziness and giddiness: Secondary | ICD-10-CM | POA: Diagnosis not present

## 2020-05-15 NOTE — Assessment & Plan Note (Signed)
Blood pressure today 130/80.  Currently taking spironolactone 25 mg every morning, indapamide 1.25 mg at night. -Will increase indapamide dose from 1.25 mg at night 2.5 mg every evening. -Continue spironolactone 25 mg daily -Patient asked to follow-up in 1-2 weeks -Last BMP was reviewed, no evidence of CKD

## 2020-05-15 NOTE — Patient Instructions (Signed)
Medication Instructions:  *If you need a refill on your cardiac medications before your next appointment, please call your pharmacy*  Follow-Up: At [facility], you and your health needs are our priority.  As part of our continuing mission to provide you with exceptional heart care, we have created designated Provider Care Teams.  These Care Teams include your primary Cardiologist (physician) and Advanced Practice Providers (APPs -  Physician Assistants and Nurse Practitioners) who all work together to provide you with the care you need, when you need it.  We recommend signing up for the patient portal called "MyChart".  Sign up information is provided on this After Visit Summary.  MyChart is used to connect with patients for Virtual Visits (Telemedicine).  Patients are able to view lab/test results, encounter notes, upcoming appointments, etc.  Non-urgent messages can be sent to your provider as well.   To learn more about what you can do with MyChart, go to [email or web address].    Your next appointment:   You can follow up with Dr. Johnsie Cancel as needed

## 2020-05-15 NOTE — Progress Notes (Signed)
    SUBJECTIVE:   CHIEF COMPLAINT / HPI:   Blood pressure follow-up: Patient's blood pressure today 130/80.  Patient currently prescribed indapamide 1.25 mg daily (which she takes at night) and spironolactone 25 mg daily.  She was seen by cardiology this morning and her blood pressure was acutely elevated, however she reports that at the time she had not yet taken her blood pressure medicines (the point was at 9, she usually takes her medications at 10 AM).   PERTINENT  PMH / PSH:  Hypertension, BMI 35  OBJECTIVE:   BP 130/80   Pulse 67   Ht 5\' 8"  (1.727 m)   Wt 231 lb (104.8 kg)   SpO2 98%   BMI 35.12 kg/m    Physical exam: General: Well-appearing patient, no apparent distress Respiratory: CTA bilaterally, comfortable work of breathing   ASSESSMENT/PLAN:   HYPERTENSION, BENIGN SYSTEMIC Blood pressure today 130/80.  Currently taking spironolactone 25 mg every morning, indapamide 1.25 mg at night. -Will increase indapamide dose from 1.25 mg at night 2.5 mg every evening. -Continue spironolactone 25 mg daily -Patient asked to follow-up in 1-2 weeks -Last BMP was reviewed, no evidence of CKD     [place]

## 2020-05-15 NOTE — Patient Instructions (Signed)
Thank you for coming in to see [place] today! Please see below to review our plan for today's visit:  Your blood pressure today remains slightly elevated at 130/80.  While this is much better than what has previously been, I think we should aim for even tighter control.  Increase your indapamide from 1.25 mg to 2.5 mg daily (take 2 tablets).    Plan to follow-up 2 weeks.  Please call the clinic at [phone] if your symptoms worsen or you have any concerns. It was our pleasure to serve you!   Dr. Milus Banister [facility]

## 2020-06-01 ENCOUNTER — Ambulatory Visit (INDEPENDENT_AMBULATORY_CARE_PROVIDER_SITE_OTHER): Payer: BC Managed Care – PPO | Admitting: Podiatry

## 2020-06-01 ENCOUNTER — Other Ambulatory Visit: Payer: Self-pay

## 2020-06-01 ENCOUNTER — Encounter: Payer: Self-pay | Admitting: Podiatry

## 2020-06-01 DIAGNOSIS — M722 Plantar fascial fibromatosis: Secondary | ICD-10-CM | POA: Diagnosis not present

## 2020-06-01 NOTE — Progress Notes (Signed)
  Subjective:  Patient ID: [name], female    DOB: Nov 24, 1965,  MRN: [id number]  Chief Complaint  Patient presents with  . Plantar Fasciitis    PT stated that she is doing a lot better she stated that the brace and splint is helpful and she has no major concerns at this time.    55 y.o. female returns with the above complaint. History confirmed with patient.  Doing much better. Pain only intermittent. 99% improvement now  Objective:  Physical Exam: warm, good capillary refill, no trophic changes or ulcerative lesions, normal DP and PT pulses and normal sensory exam.   Right Foot: No  point tenderness over the heel pad   No images are attached to the encounter.  Radiographs: X-ray of the right foot: Significant enthesophyte formation of the plantar calcaneus and small 1 on the posterior Assessment:   1. Plantar fasciitis, right      Plan:  Patient was evaluated and treated and all questions answered.    -Continue stretching and brace, meloxicam PRN.  -Return if worsens or does not improve in next 4-6 weeks   No follow-ups on file.

## 2020-06-05 ENCOUNTER — Ambulatory Visit (INDEPENDENT_AMBULATORY_CARE_PROVIDER_SITE_OTHER): Payer: BC Managed Care – PPO | Admitting: Family Medicine

## 2020-06-05 ENCOUNTER — Other Ambulatory Visit: Payer: Self-pay

## 2020-06-05 ENCOUNTER — Encounter: Payer: Self-pay | Admitting: Family Medicine

## 2020-06-05 VITALS — BP 134/82 | HR 66 | Wt 230.0 lb

## 2020-06-05 DIAGNOSIS — I10 Essential (primary) hypertension: Secondary | ICD-10-CM

## 2020-06-05 DIAGNOSIS — Z13220 Encounter for screening for lipoid disorders: Secondary | ICD-10-CM | POA: Insufficient documentation

## 2020-06-05 NOTE — Patient Instructions (Addendum)
Thank you for coming in to see [place] today! Please see below to review our plan for today's visit:  1. Keep taking Spironolactone 25mg  with Indapamide 2.5mg  daily. 2. We are checking your kidney and electrolyte function today and we will make adjustments as needed. 3. We are also checking your cholesterol levels.    Please call the clinic at [phone] if your symptoms worsen or you have any concerns. It was our pleasure to serve you!   Dr. Milus Banister [facility]

## 2020-06-05 NOTE — Assessment & Plan Note (Signed)
Patient's ASCVD risk is 7.6%, patient not currently taking statin. -Repeat lipid panel

## 2020-06-05 NOTE — Progress Notes (Signed)
    SUBJECTIVE:   CHIEF COMPLAINT / HPI:   Blood pressure follow-up: Patient's blood pressure today 134/82.  Patient previously seen 05/15/2020. Currently taking spironolactone 25 mg every morning, indapamide 2.5 mg at night.  Plan to recheck patient's BMP today to make sure her potassium is staying stable  Elevated LDL: Patient was found to have L elevated LDL on September 2020 lipid panel.  Patient is not currently taking a cholesterol-lowering medication.  Plan to check lipid panel today.  PERTINENT  PMH / PSH:  BMI 34.9, hypertension, dyslipidemia   OBJECTIVE:   BP 134/82   Pulse 66   Wt 230 lb (104.3 kg)   BMI 34.97 kg/m    Physical exam: General: Well-appearing patient, no apparent distress Respiratory: CTA bilaterally, comfortable work of breathing Cardio:, S1-S2 present, no murmurs appreciated  ASSESSMENT/PLAN:   HYPERTENSION, BENIGN SYSTEMIC -Continue spironolactone 25 mg and indapamide 2.5 mg daily. -Check BMP today to follow-up potassium, will follow up and treat as needed  Screening cholesterol level Patient's ASCVD risk is 7.6%, patient not currently taking statin. -Repeat lipid panel      [place]

## 2020-06-05 NOTE — Assessment & Plan Note (Signed)
-  Continue spironolactone 25 mg and indapamide 2.5 mg daily. -Check BMP today to follow-up potassium, will follow up and treat as needed

## 2020-06-06 LAB — BASIC METABOLIC PANEL
BUN/Creatinine Ratio: 13 (ref 9–23)
BUN: 11 mg/dL (ref 6–24)
CO2: 24 mmol/L (ref 20–29)
Calcium: 9.3 mg/dL (ref 8.7–10.2)
Chloride: 100 mmol/L (ref 96–106)
Creatinine, Ser: 0.85 mg/dL (ref 0.57–1.00)
GFR calc Af Amer: 89 mL/min/{1.73_m2} (ref >59)
GFR calc non Af Amer: 77 mL/min/{1.73_m2} (ref >59)
Glucose: 92 mg/dL (ref 65–99)
Potassium: 3.4 mmol/L — ABNORMAL LOW (ref 3.5–5.2)
Sodium: 137 mmol/L (ref 134–144)

## 2020-06-06 LAB — LIPID PANEL
Chol/HDL Ratio: 4.2 ratio (ref 0.0–4.4)
Cholesterol, Total: 172 mg/dL (ref 100–199)
HDL: 41 mg/dL (ref >39)
LDL Chol Calc (NIH): 105 mg/dL — ABNORMAL HIGH (ref 0–99)
Triglycerides: 147 mg/dL (ref 0–149)
VLDL Cholesterol Cal: 26 mg/dL (ref 5–40)

## 2020-07-28 ENCOUNTER — Ambulatory Visit (INDEPENDENT_AMBULATORY_CARE_PROVIDER_SITE_OTHER): Payer: BC Managed Care – PPO | Admitting: Family Medicine

## 2020-07-28 ENCOUNTER — Other Ambulatory Visit: Payer: Self-pay

## 2020-07-28 ENCOUNTER — Encounter: Payer: Self-pay | Admitting: Family Medicine

## 2020-07-28 DIAGNOSIS — I10 Essential (primary) hypertension: Secondary | ICD-10-CM

## 2020-07-28 NOTE — Progress Notes (Signed)
    SUBJECTIVE:   CHIEF COMPLAINT / HPI:   Hypertension Here for blood pressure check.  Unable to tolerate increased indapamide - made her feel tired and thoughts caused bruising.  Back to taking indapamide 1.25 and spironolactone 25 mg daily.  Today had to go in early to work at 2 am when normal schedule is to start at 730 AM.   Home blood pressure readings are around 140s/80s  PERTINENT  PMH / PSH: brother died of Chf in 26-May-2020  OBJECTIVE:   BP (!) 154/82   Pulse 93   Wt 234 lb 6.4 oz (106.3 kg)   SpO2 100%   BMI 35.64 kg/m   Heart - Regular rate and rhythm.  No murmurs, gallops or rubs.    Lungs:  Normal respiratory effort, chest expands symmetrically. Lungs are clear to auscultation, no crackles or wheezes. Repeat blood pressure was higher Mild edema in L ankle (had many ankle surgeries   ASSESSMENT/PLAN:   HYPERTENSION, BENIGN SYSTEMIC Elevated today but has been up all night.  Will continue current regimen of low dose indapamide and spironolactone.  No other good options.  Monitor at home and bring in cuff. Discussed weight loss      Lind Covert, MD [place]

## 2020-07-28 NOTE — Patient Instructions (Signed)
Good to see you today - Thank you for coming in  Things we discussed today:  Your goal blood pressure is less than 130/80.  Check your blood pressure several times a week.  If regularly higher than this please let me know - either with MyChart or leaving a phone message. Next visit please bring in your blood pressure cuff.    Regular sleep not more than 7-8 hours in the bed and no naps  Look up good sleep habits  Please always bring your medication bottles  Come back to see me in one month

## 2020-07-28 NOTE — Assessment & Plan Note (Addendum)
Elevated today but has been up all night.  Will continue current regimen of low dose indapamide and spironolactone.  No other good options.  Monitor at home and bring in cuff. Discussed weight loss

## 2020-08-04 ENCOUNTER — Other Ambulatory Visit: Payer: Self-pay | Admitting: Family Medicine

## 2020-08-20 ENCOUNTER — Other Ambulatory Visit: Payer: Self-pay | Admitting: Family Medicine

## 2020-08-20 ENCOUNTER — Other Ambulatory Visit: Payer: Self-pay | Admitting: Podiatry

## 2020-08-20 DIAGNOSIS — M722 Plantar fascial fibromatosis: Secondary | ICD-10-CM

## 2020-08-20 NOTE — Telephone Encounter (Signed)
Please advise 

## 2020-08-22 ENCOUNTER — Encounter (HOSPITAL_COMMUNITY): Payer: Self-pay

## 2020-08-22 ENCOUNTER — Ambulatory Visit (HOSPITAL_COMMUNITY)
Admission: EM | Admit: 2020-08-22 | Discharge: 2020-08-22 | Disposition: A | Discharge status: Home / Self Care | Payer: BC Managed Care – PPO | Attending: Physician Assistant | Admitting: Physician Assistant

## 2020-08-22 ENCOUNTER — Other Ambulatory Visit: Payer: Self-pay

## 2020-08-22 DIAGNOSIS — G8929 Other chronic pain: Secondary | ICD-10-CM | POA: Diagnosis not present

## 2020-08-22 DIAGNOSIS — M25562 Pain in left knee: Secondary | ICD-10-CM

## 2020-08-22 DIAGNOSIS — M25572 Pain in left ankle and joints of left foot: Secondary | ICD-10-CM | POA: Insufficient documentation

## 2020-08-22 DIAGNOSIS — M545 Low back pain, unspecified: Secondary | ICD-10-CM | POA: Insufficient documentation

## 2020-08-22 LAB — COMPREHENSIVE METABOLIC PANEL
ALT: 27 U/L (ref 0–44)
AST: 25 U/L (ref 15–41)
Albumin: 3.5 g/dL (ref 3.5–5.0)
Alkaline Phosphatase: 74 U/L (ref 38–126)
Anion gap: 8 (ref 5–15)
BUN: 8 mg/dL (ref 6–20)
CO2: 28 mmol/L (ref 22–32)
Calcium: 9.4 mg/dL (ref 8.9–10.3)
Chloride: 99 mmol/L (ref 98–111)
Creatinine, Ser: 0.76 mg/dL (ref 0.44–1.00)
GFR, Estimated: >60 mL/min (ref >60)
Glucose, Bld: 81 mg/dL (ref 70–99)
Potassium: 3.6 mmol/L (ref 3.5–5.1)
Sodium: 135 mmol/L (ref 135–145)
Total Bilirubin: 0.7 mg/dL (ref 0.3–1.2)
Total Protein: 7.5 g/dL (ref 6.5–8.1)

## 2020-08-22 LAB — CBC WITH DIFFERENTIAL/PLATELET
Abs Immature Granulocytes: 0.02 10*3/uL (ref 0.00–0.07)
Basophils Absolute: 0 10*3/uL (ref 0.0–0.1)
Basophils Relative: 1 %
Eosinophils Absolute: 0.1 10*3/uL (ref 0.0–0.5)
Eosinophils Relative: 1 %
HCT: 37 % (ref 36.0–46.0)
Hemoglobin: 12.7 g/dL (ref 12.0–15.0)
Immature Granulocytes: 0 %
Lymphocytes Relative: 47 %
Lymphs Abs: 2.6 10*3/uL (ref 0.7–4.0)
MCH: 27.1 pg (ref 26.0–34.0)
MCHC: 34.3 g/dL (ref 30.0–36.0)
MCV: 79.1 fL — ABNORMAL LOW (ref 80.0–100.0)
Monocytes Absolute: 0.6 10*3/uL (ref 0.1–1.0)
Monocytes Relative: 10 %
Neutro Abs: 2.2 10*3/uL (ref 1.7–7.7)
Neutrophils Relative %: 41 %
Platelets: 281 10*3/uL (ref 150–400)
RBC: 4.68 MIL/uL (ref 3.87–5.11)
RDW: 14.1 % (ref 11.5–15.5)
WBC: 5.5 10*3/uL (ref 4.0–10.5)
nRBC: 0 % (ref 0.0–0.2)

## 2020-08-22 MED ORDER — DICLOFENAC SODIUM 75 MG PO TBEC: 75.0000 mg | DELAYED_RELEASE_TABLET | Freq: Two times a day (BID) | ORAL | 0 refills | Status: DC

## 2020-08-22 MED ORDER — TIZANIDINE HCL 4 MG PO CAPS: 4.0000 mg | ORAL_CAPSULE | Freq: Every evening | ORAL | 0 refills | Status: DC | PRN

## 2020-08-22 NOTE — ED Provider Notes (Signed)
[place]    CSN: [id number] Arrival date & time: 08/22/20  1207      History   Chief Complaint Chief Complaint  Patient presents with  . Knee Pain    HPI [name] is a 55 y.o. female.   Patient presents today with a several month history of worsening left knee pain and lower back pain.  Reports that she has a history of left ankle fusion after having allergic reaction to nickel replacement.  As result she was told she will have knee and back pain.  She continues have ankle pain and is scheduled to follow-up with orthopedics.  Over the last several months she reports left knee and back pain have worsened and are currently rated 10 on a 0-10 pain scale, localized to medial left knee and left lower back without radiation, described as aching, no aggravating relieving factors identified.  She has tried Tylenol and prescribed Mobic without improvement of symptoms.  She has gabapentin available but states this is ineffective.  Denies any bowel/bladder incontinence, lower extremity weakness, saddle anesthesia.  Prior to symptom onset she was working more hours but is returned to her normal schedule without improvement of symptoms.  Denies any changes to footwear.  Reports pain is interfering with her ability perform daily activities.  Denies history of malignancy.     Past Medical History:  Diagnosis Date  . Allergy   . Arthritis    Lt ankle  . Chronic kidney disease    right renal cyst   . GERD (gastroesophageal reflux disease)    hx of no problems at present   . Headache(784.0)    occasional headache   . Hypertension   . Influenza 04/20/2012  . PONV (postoperative nausea and vomiting)    N/V and trouble urinating after surgery     Patient Active Problem List   Diagnosis Date Noted  . Left foot pain 02/26/2020  . Nickel allergy 11/29/2017  . Angioedema 06/16/2016  . Obesity (BMI 30-39.9) 11/17/2010  . Breast ductal hyperplasia, atypical 10/30/2010   . KIDNEY CYST, ACQUIRED 03/19/2007  . HYPERTENSION, BENIGN SYSTEMIC 06/29/2006    Past Surgical History:  Procedure Laterality Date  . BREAST EXCISIONAL BIOPSY Left   . BREAST SURGERY  2010   lumpectomy Lt breast  . CARPAL TUNNEL RELEASE Right 08/06/2015   Procedure: RIGHT CARPAL TUNNEL RELEASE;  Surgeon: Leanora Cover, MD;  Location: [facility];  Service: Orthopedics;  Laterality: Right;  . DORSAL COMPARTMENT RELEASE Right 08/06/2015   Procedure: RIGHT FIRST RELEASE DORSAL COMPARTMENT (DEQUERVAIN);  Surgeon: Leanora Cover, MD;  Location: [facility];  Service: Orthopedics;  Laterality: Right;  . ENDOMETRIAL ABLATION  October 18 2010   at [facility]  . FRACTURE SURGERY  2011   Lt ankle surgeries x 3   . JOINT REPLACEMENT     left ankle 8/12   . LAPAROSCOPIC NEPHRECTOMY  03/09/2011   Procedure: LAPAROSCOPIC NEPHRECTOMY;  Surgeon: Franchot Gallo;  Location: [facility];  Service: Urology;  Laterality: Right;  Laparoscopic Assisted Removal Of Renal Cyst   . OTHER SURGICAL HISTORY     2 hand surgeries on left and shoulder surgery on right shoulder   . TOTAL ANKLE ARTHROPLASTY Left 07/05/2012   Procedure: Removal of total ankle implants,I & D with insertion of  new poly spacer;  Surgeon: Wylene Simmer, MD;  Location: [facility];  Service: Orthopedics;  Laterality: Left;  . TUBAL LIGATION  1988    OB History  No obstetric history on file.      Home Medications    Prior to Admission medications   Medication Sig Start Date End Date Taking? Authorizing Provider  ASPIRIN 81 PO Take by mouth.   Yes [provider]  diclofenac (VOLTAREN) 75 MG EC tablet Take 1 tablet (75 mg total) by mouth 2 (two) times daily. 08/22/20  Yes Kaylani Fromme K, PA-C  tiZANidine (ZANAFLEX) 4 MG capsule Take 1 capsule (4 mg total) by mouth at bedtime as needed for muscle spasms. 08/22/20  Yes Lugene Hitt K, PA-C  indapamide (LOZOL) 1.25 MG tablet TAKE 1 TABLET BY MOUTH EVERY DAY 08/20/20    Lind Covert, MD  meloxicam (MOBIC) 15 MG tablet TAKE 1 TABLET BY MOUTH EVERY DAY 08/20/20   Criselda Peaches, DPM  spironolactone (ALDACTONE) 25 MG tablet TAKE 1 TABLET BY MOUTH EVERY DAY 08/05/20   Lind Covert, MD  vitamin E 400 UNIT capsule Take 400 Units by mouth daily.    [provider]    Family History Family History  Problem Relation Age of Onset  . Diabetes Mother   . Heart disease Mother   . Hypertension Father   . Diabetes Sister   . Kidney disease Sister   . Hypertension Brother   . Kidney disease Brother   . Arthritis Brother   . Cancer Maternal Grandmother   . Diabetes Sister   . Hypertension Sister   . Hypertension Sister   . Hypertension Sister   . Hypertension Brother   . Mental illness Brother   . Breast cancer Niece   . Colon cancer Neg Hx     Social History Social History   Tobacco Use  . Smoking status: Never Smoker  . Smokeless tobacco: Never Used  Vaping Use  . Vaping Use: Never used  Substance Use Topics  . Alcohol use: No    Alcohol/week: 0.0 standard drinks  . Drug use: No     Allergies   Tetracycline hcl, Angiotensin receptor blockers, Nickel, Septra [bactrim], and Sulfamethoxazole-trimethoprim   Review of Systems Review of Systems  Constitutional: Positive for activity change. Negative for appetite change, fatigue and fever.  Respiratory: Negative for cough and shortness of breath.   Cardiovascular: Negative for chest pain.  Gastrointestinal: Negative for abdominal pain, diarrhea, nausea and vomiting.  Musculoskeletal: Positive for arthralgias and back pain. Negative for myalgias and neck pain.  Neurological: Positive for numbness. Negative for dizziness, weakness, light-headedness and headaches.     Physical Exam Triage Vital Signs ED Triage Vitals  Enc Vitals Group     BP 08/22/20 1254 (!) 159/68     Pulse Rate 08/22/20 1254 (!) 58     Resp 08/22/20 1254 19     Temp 08/22/20 1254 97.8 F (36.6 C)      Temp Source 08/22/20 1254 Oral     SpO2 08/22/20 1254 100 %     Weight --      Height --      Head Circumference --      Peak Flow --      Pain Score 08/22/20 1247 10     Pain Loc --      Pain Edu? --      Excl. in [place]? --    No data found.  Updated Vital Signs BP (!) 159/68 (BP Location: Right Arm)   Pulse (!) 58   Temp 97.8 F (36.6 C) (Oral)   Resp 19   SpO2 100%  Visual Acuity Right Eye Distance:   Left Eye Distance:   Bilateral Distance:    Right Eye Near:   Left Eye Near:    Bilateral Near:     Physical Exam Vitals reviewed.  Constitutional:      General: She is awake. She is not in acute distress.    Appearance: Normal appearance. She is not ill-appearing.     Comments: Very pleasant female appears stated age in no acute distress  HENT:     Head: Normocephalic and atraumatic.  Cardiovascular:     Rate and Rhythm: Normal rate and regular rhythm.     Heart sounds: No murmur heard.   Pulmonary:     Effort: Pulmonary effort is normal.     Breath sounds: Normal breath sounds. No wheezing, rhonchi or rales.     Comments: Clear to auscultation bilaterally Abdominal:     Palpations: Abdomen is soft.     Tenderness: There is no abdominal tenderness.  Musculoskeletal:     Cervical back: No tenderness or bony tenderness.     Thoracic back: No tenderness or bony tenderness.     Lumbar back: Tenderness present. No bony tenderness. Negative right straight leg raise test and negative left straight leg raise test.     Left knee: Swelling and effusion present. Decreased range of motion. Tenderness present over the medial joint line. No LCL laxity, MCL laxity, ACL laxity or PCL laxity.    Comments: Back: No pain with percussion of lumbar vertebrae.  Tenderness palpation of left paraspinal muscles.  No deformity or step-off noted.  Left knee: Moderate effusion noted.  Tenderness palpation of medial joint line.  No ligamentous laxity on exam.  Psychiatric:         Behavior: Behavior is cooperative.      UC Treatments / Results  Labs (all labs ordered are listed, but only abnormal results are displayed) Labs Reviewed  CBC WITH DIFFERENTIAL/PLATELET  COMPREHENSIVE METABOLIC PANEL    EKG   Radiology No results found.  Procedures Procedures (including critical care time)  Medications Ordered in UC Medications - No data to display  Initial Impression / Assessment and Plan / UC Course  I have reviewed the triage vital signs and the nursing notes.  Pertinent labs & imaging results that were available during my care of the patient were reviewed by me and considered in my medical decision making (see chart for details).     Will transition to diclofenac as patient has not had improvement with Mobic.  She was instructed not to take additional NSAIDs with this medication due to risk of GI bleeding.  CBC and CMP updated today-results pending.  No indication for imaging given no bony tenderness and a traumatic injury.  Will use Zanaflex at night for muscle relief with instruction not to drive or drink alcohol with this medication as drowsiness is a common side effect.  Recommended she use rest, heat, stretch for additional symptom relief.  Strict return precautions given to which patient expressed understanding.  She is to follow-up with orthopedics as previously scheduled.  Final Clinical Impressions(s) / UC Diagnoses   Final diagnoses:  Chronic pain of left knee  Pain in left lumbar region of back  Chronic pain of left ankle     Discharge Instructions     Do not take Mobic (meloxicam) with diclofenac.  He should not do additional NSAIDs including aspirin, ibuprofen/Advil, naproxen/Aleve.  Do not drive or drink alcohol with Zanaflex as this can make  you sleepy.  We will be in touch with your lab results if there is anything we need to adjust.  Continue with rest.  Follow-up with your orthopedic as discussed.    ED Prescriptions     Medication Sig Dispense Auth. Provider   tiZANidine (ZANAFLEX) 4 MG capsule Take 1 capsule (4 mg total) by mouth at bedtime as needed for muscle spasms. 14 capsule Nedim Oki K, PA-C   diclofenac (VOLTAREN) 75 MG EC tablet Take 1 tablet (75 mg total) by mouth 2 (two) times daily. 28 tablet Ladonya Jerkins, Derry Skill, PA-C     PDMP not reviewed this encounter.   Terrilee Croak, PA-C 08/22/20 1344

## 2020-08-22 NOTE — ED Triage Notes (Signed)
Pt presents with left knee pain and left sided lower back pain x 1 month. Pt states she has a fusion in the left ankle :"[ut a lot stress in my left knee". Pt reports she has a follow up with Orthopedic and CT on 08/31/2020.

## 2020-08-22 NOTE — Discharge Instructions (Signed)
Do not take Mobic (meloxicam) with diclofenac.  He should not do additional NSAIDs including aspirin, ibuprofen/Advil, naproxen/Aleve.  Do not drive or drink alcohol with Zanaflex as this can make you sleepy.  We will be in touch with your lab results if there is anything we need to adjust.  Continue with rest.  Follow-up with your orthopedic as discussed.

## 2020-08-25 ENCOUNTER — Other Ambulatory Visit: Payer: Self-pay

## 2020-08-25 ENCOUNTER — Ambulatory Visit: Payer: BC Managed Care – PPO | Admitting: Family Medicine

## 2020-08-25 ENCOUNTER — Encounter: Payer: Self-pay | Admitting: Family Medicine

## 2020-08-25 DIAGNOSIS — I10 Essential (primary) hypertension: Secondary | ICD-10-CM

## 2020-08-25 DIAGNOSIS — M549 Dorsalgia, unspecified: Secondary | ICD-10-CM | POA: Insufficient documentation

## 2020-08-25 DIAGNOSIS — M5442 Lumbago with sciatica, left side: Secondary | ICD-10-CM | POA: Diagnosis not present

## 2020-08-25 NOTE — Assessment & Plan Note (Signed)
Consistent with musculoskeletal pain perhaps from altered mechanics with her knee pain.  No signs of nerve impingement or cancer.  Continue nsaids and topical treatments

## 2020-08-25 NOTE — Assessment & Plan Note (Signed)
Improved control.  Continue to monitor at home.  Encouraged continued diet changes

## 2020-08-25 NOTE — Progress Notes (Signed)
    SUBJECTIVE:   CHIEF COMPLAINT / HPI:   HYPERTENSION Taking her medications regularly.  Cutting back on sodas.  BACK PAIN For last weeks.  Started when had knee and ankle pain flare on her L side, which is chronic.  No weakness or incontinence or dysuria.  Taking voltaren pills given by UC for the knee pain  PERTINENT  PMH / PSH: has a great grandchild  OBJECTIVE:   BP 136/80   Pulse 63   Wt 231 lb 9.6 oz (105.1 kg)   SpO2 98%   BMI 35.21 kg/m   Back - tender over L paraspinous muscles.  No vertebral body or CVA tenderness Able to stand on toes No SLR   ASSESSMENT/PLAN:   HYPERTENSION, BENIGN SYSTEMIC Improved control.  Continue to monitor at home.  Encouraged continued diet changes   Back pain Consistent with musculoskeletal pain perhaps from altered mechanics with her knee pain.  No signs of nerve impingement or cancer.  Continue nsaids and topical treatments      Veronica Covert, MD [place]

## 2020-08-25 NOTE — Patient Instructions (Signed)
Good to see you today - Thank you for coming in  Things we discussed today:  For the back - use Aspercreme and heat along with the diclofenac - If not better in 2-3 weeks or worsening or any weakness let me know  For the blood pressure - keep taking the medications as you are - keep staying from sweet drinks - Monitor at home if regularly > 140/90 let me know   Please always bring your medication bottles  Come back to see me in 3 months

## 2020-08-31 DIAGNOSIS — M19072 Primary osteoarthritis, left ankle and foot: Secondary | ICD-10-CM | POA: Diagnosis not present

## 2020-08-31 DIAGNOSIS — M7752 Other enthesopathy of left foot: Secondary | ICD-10-CM | POA: Diagnosis not present

## 2020-08-31 DIAGNOSIS — M6702 Short Achilles tendon (acquired), left ankle: Secondary | ICD-10-CM | POA: Diagnosis not present

## 2020-08-31 DIAGNOSIS — M2022 Hallux rigidus, left foot: Secondary | ICD-10-CM | POA: Diagnosis not present

## 2020-08-31 DIAGNOSIS — Z96662 Presence of left artificial ankle joint: Secondary | ICD-10-CM | POA: Diagnosis not present

## 2020-08-31 DIAGNOSIS — M79672 Pain in left foot: Secondary | ICD-10-CM | POA: Diagnosis not present

## 2020-08-31 DIAGNOSIS — M25572 Pain in left ankle and joints of left foot: Secondary | ICD-10-CM | POA: Diagnosis not present

## 2020-08-31 DIAGNOSIS — M19071 Primary osteoarthritis, right ankle and foot: Secondary | ICD-10-CM | POA: Diagnosis not present

## 2020-11-19 DIAGNOSIS — M25562 Pain in left knee: Secondary | ICD-10-CM | POA: Diagnosis not present

## 2020-11-26 DIAGNOSIS — M25562 Pain in left knee: Secondary | ICD-10-CM | POA: Diagnosis not present

## 2020-11-30 DIAGNOSIS — M79672 Pain in left foot: Secondary | ICD-10-CM | POA: Diagnosis not present

## 2020-11-30 DIAGNOSIS — L84 Corns and callosities: Secondary | ICD-10-CM | POA: Diagnosis not present

## 2020-11-30 DIAGNOSIS — M25572 Pain in left ankle and joints of left foot: Secondary | ICD-10-CM | POA: Diagnosis not present

## 2021-01-20 ENCOUNTER — Ambulatory Visit: Payer: BC Managed Care – PPO | Admitting: Family Medicine

## 2021-01-26 ENCOUNTER — Other Ambulatory Visit: Payer: Self-pay | Admitting: Family Medicine

## 2021-01-26 DIAGNOSIS — Z1231 Encounter for screening mammogram for malignant neoplasm of breast: Secondary | ICD-10-CM

## 2021-01-29 ENCOUNTER — Other Ambulatory Visit: Payer: Self-pay | Admitting: Family Medicine

## 2021-03-01 ENCOUNTER — Ambulatory Visit
Admission: RE | Admit: 2021-03-01 | Discharge: 2021-03-01 | Disposition: A | Discharge status: Home / Self Care | Payer: BC Managed Care – PPO | Source: Ambulatory Visit | Attending: Family Medicine | Admitting: Family Medicine

## 2021-03-01 ENCOUNTER — Other Ambulatory Visit: Payer: Self-pay

## 2021-03-01 DIAGNOSIS — Z1231 Encounter for screening mammogram for malignant neoplasm of breast: Secondary | ICD-10-CM | POA: Diagnosis not present

## 2021-03-08 DIAGNOSIS — E785 Hyperlipidemia, unspecified: Secondary | ICD-10-CM | POA: Insufficient documentation

## 2021-03-08 NOTE — Progress Notes (Signed)
    SUBJECTIVE:   CHIEF COMPLAINT / HPI:   Here for physical.  No specific concerns.   Cholesterol  No personal history of heart disease but two brothers have had heart failue  Hypertension Has been cutting out pork and bacon and not taking nsaids - only tylenol.    PERTINENT  PMH / PSH: having some L sided back pain thinks related to her chronic L ankle problems  OBJECTIVE:   BP 138/84   Pulse (!) 51   Ht 5\' 8"  (1.727 m)   Wt 225 lb 12.8 oz (102.4 kg)   SpO2 97%   BMI 34.33 kg/m   Heart - Regular rate and rhythm.  No murmurs, gallops or rubs.    Lungs:  Normal respiratory effort, chest expands symmetrically. Lungs are clear to auscultation, no crackles or wheezes. Abdomen: soft and non-tender without masses, organomegaly or hernias noted.  No guarding or rebound Extremities:  No cyanosis, edema, or deformity noted with good range of motion of all major joints.   Mobility:able to get up and down from exam table without assistance or distress   ASSESSMENT/PLAN:   Physical Annual Examination Female over 56 yo  I reviewed the following patient responses on our Physical Exam Form Tobacco use and candidacy for lung cancer screening Alcohol Use  Weight  Exercise  Risk for STI  Fall risk Advanced Directive Increased family cancer risk Violence risk  PHQ9 score reviewed  Blood pressure reviewed  I considered the following items based upon USPSTF recommendations: Cervical Cancer if female at birth Mammogram Colon cancer screening Osteoporosis screening HIV testing:  Hepatitis C testing Cholesterol screening STI screening if high risk (Hepatitis B, Syphilis, Gonorrhea, Chlamydia) Immunizations - Influenza, Covid, [place], Pneumonia, Tetanus  See After Visit Summary for recommendations   HYPERTENSION, BENIGN SYSTEMIC Better control today.  Continue diet changes and avoiding NSAIDS  Hyperlipidemia Not controlled.  Recommended and discussed a statin.  She would  like to try      Lind Covert, [place]

## 2021-03-09 ENCOUNTER — Ambulatory Visit (INDEPENDENT_AMBULATORY_CARE_PROVIDER_SITE_OTHER): Payer: BC Managed Care – PPO | Admitting: Family Medicine

## 2021-03-09 ENCOUNTER — Other Ambulatory Visit: Payer: Self-pay

## 2021-03-09 ENCOUNTER — Ambulatory Visit (INDEPENDENT_AMBULATORY_CARE_PROVIDER_SITE_OTHER): Payer: BC Managed Care – PPO

## 2021-03-09 VITALS — BP 138/84 | HR 51 | Ht 68.0 in | Wt 225.8 lb

## 2021-03-09 DIAGNOSIS — I10 Essential (primary) hypertension: Secondary | ICD-10-CM | POA: Diagnosis not present

## 2021-03-09 DIAGNOSIS — Z1159 Encounter for screening for other viral diseases: Secondary | ICD-10-CM | POA: Diagnosis not present

## 2021-03-09 DIAGNOSIS — Z23 Encounter for immunization: Secondary | ICD-10-CM | POA: Diagnosis not present

## 2021-03-09 DIAGNOSIS — E785 Hyperlipidemia, unspecified: Secondary | ICD-10-CM | POA: Diagnosis not present

## 2021-03-09 MED ORDER — ATORVASTATIN CALCIUM 40 MG PO TABS
40.0000 mg | ORAL_TABLET | Freq: Every day | ORAL | 3 refills | Status: DC
Start: 2021-03-09 — End: 2021-05-31

## 2021-03-09 NOTE — Patient Instructions (Signed)
Good to see you today - Thank you for coming in  Things we discussed today:  Cholesterol - take one lipitor a day for heart protection.  Let me know if any problems or questions with the medication   Please always bring your medication bottles  Come back to see me in 6 months unless your blood pressure seems to be getting worse

## 2021-03-09 NOTE — Assessment & Plan Note (Signed)
Better control today.  Continue diet changes and avoiding NSAIDS

## 2021-03-09 NOTE — Assessment & Plan Note (Signed)
Not controlled.  Recommended and discussed a statin.  She would like to try

## 2021-03-10 ENCOUNTER — Encounter: Payer: Self-pay | Admitting: Family Medicine

## 2021-03-30 DIAGNOSIS — Z96662 Presence of left artificial ankle joint: Secondary | ICD-10-CM | POA: Diagnosis not present

## 2021-03-30 DIAGNOSIS — M2022 Hallux rigidus, left foot: Secondary | ICD-10-CM | POA: Diagnosis not present

## 2021-03-30 DIAGNOSIS — M19072 Primary osteoarthritis, left ankle and foot: Secondary | ICD-10-CM | POA: Diagnosis not present

## 2021-03-30 DIAGNOSIS — M6702 Short Achilles tendon (acquired), left ankle: Secondary | ICD-10-CM | POA: Diagnosis not present

## 2021-05-09 ENCOUNTER — Other Ambulatory Visit: Payer: Self-pay | Admitting: Family Medicine

## 2021-05-12 DIAGNOSIS — M67431 Ganglion, right wrist: Secondary | ICD-10-CM | POA: Diagnosis not present

## 2021-05-28 ENCOUNTER — Telehealth: Payer: Self-pay

## 2021-05-28 DIAGNOSIS — E785 Hyperlipidemia, unspecified: Secondary | ICD-10-CM

## 2021-05-28 NOTE — Telephone Encounter (Signed)
Patient calls nurse line regarding side effects of atorvastatin. Patient reports that she has been having muscle cramps and aches. States that symptoms started two days ago. Patient also reports nausea.   Patient is requesting to discuss an alternative cholesterol medication. Patient states that pharmacist recommended taking medication every other day and that she is going to do this over the weekend.   Patient is requesting returned phone call from PCP to discuss this matter further.   Talbot Grumbling, RN

## 2021-05-31 MED ORDER — ROSUVASTATIN CALCIUM 5 MG PO TABS: 5.0000 mg | ORAL_TABLET | Freq: Every day | ORAL | 3 refills | Status: DC

## 2021-05-31 NOTE — Telephone Encounter (Signed)
Called patient. Primary symptoms were muscle cramps in legs and feet. Now taking every other day and symptoms improved. Discussed, switched to crestor 5 mg. Called pharmacy and cancelled lipitor.   Dorris Singh, MD  Family Medicine Teaching Service

## 2021-06-02 DIAGNOSIS — N281 Cyst of kidney, acquired: Secondary | ICD-10-CM | POA: Diagnosis not present

## 2021-06-17 ENCOUNTER — Other Ambulatory Visit: Payer: Self-pay | Admitting: Orthopedic Surgery

## 2021-06-22 ENCOUNTER — Telehealth: Payer: Self-pay | Admitting: *Deleted

## 2021-06-22 NOTE — Telephone Encounter (Signed)
Received voicemail from [place] [facility] at the [facility] about patient's clearance form.  They have received it but they need it to be updated with the specific number of days prior to surgery patient needs to stop her aspirin.  This is something they need to have when they fax the form to the surgery center.  I have pulled the form, made a note on it and placed it back in your box.  Once completed, please place back in to be faxed pile.    Thanks [employer]

## 2021-06-23 ENCOUNTER — Encounter (HOSPITAL_BASED_OUTPATIENT_CLINIC_OR_DEPARTMENT_OTHER): Payer: Self-pay | Admitting: Orthopedic Surgery

## 2021-06-23 ENCOUNTER — Other Ambulatory Visit: Payer: Self-pay

## 2021-06-23 NOTE — Telephone Encounter (Signed)
Form completed and refaxed yesterday 06/22/21.  Ronnett Pullin,CMA

## 2021-06-28 ENCOUNTER — Encounter (HOSPITAL_BASED_OUTPATIENT_CLINIC_OR_DEPARTMENT_OTHER)
Admission: RE | Admit: 2021-06-28 | Discharge: 2021-06-28 | Disposition: A | Discharge status: Home / Self Care | Payer: BC Managed Care – PPO | Source: Ambulatory Visit | Attending: Orthopedic Surgery | Admitting: Orthopedic Surgery

## 2021-06-28 DIAGNOSIS — Z01818 Encounter for other preprocedural examination: Secondary | ICD-10-CM | POA: Insufficient documentation

## 2021-06-28 DIAGNOSIS — I10 Essential (primary) hypertension: Secondary | ICD-10-CM | POA: Insufficient documentation

## 2021-06-28 LAB — BASIC METABOLIC PANEL
Anion gap: 9 (ref 5–15)
BUN: 11 mg/dL (ref 6–20)
CO2: 28 mmol/L (ref 22–32)
Calcium: 9.2 mg/dL (ref 8.9–10.3)
Chloride: 103 mmol/L (ref 98–111)
Creatinine, Ser: 0.79 mg/dL (ref 0.44–1.00)
GFR, Estimated: >60 mL/min (ref >60)
Glucose, Bld: 124 mg/dL — ABNORMAL HIGH (ref 70–99)
Potassium: 3.7 mmol/L (ref 3.5–5.1)
Sodium: 140 mmol/L (ref 135–145)

## 2021-06-28 NOTE — Progress Notes (Signed)

## 2021-07-02 ENCOUNTER — Encounter (HOSPITAL_BASED_OUTPATIENT_CLINIC_OR_DEPARTMENT_OTHER)
Admission: RE | Disposition: A | Discharge status: Home / Self Care | Payer: Self-pay | Source: Home / Self Care | Attending: Orthopedic Surgery

## 2021-07-02 ENCOUNTER — Ambulatory Visit (HOSPITAL_BASED_OUTPATIENT_CLINIC_OR_DEPARTMENT_OTHER): Payer: BC Managed Care – PPO | Admitting: Certified Registered"

## 2021-07-02 ENCOUNTER — Encounter (HOSPITAL_BASED_OUTPATIENT_CLINIC_OR_DEPARTMENT_OTHER): Payer: Self-pay | Admitting: Orthopedic Surgery

## 2021-07-02 ENCOUNTER — Other Ambulatory Visit: Payer: Self-pay

## 2021-07-02 ENCOUNTER — Ambulatory Visit (HOSPITAL_BASED_OUTPATIENT_CLINIC_OR_DEPARTMENT_OTHER)
Admission: RE | Admit: 2021-07-02 | Discharge: 2021-07-02 | Disposition: A | Discharge status: Home / Self Care | Payer: BC Managed Care – PPO | Attending: Orthopedic Surgery | Admitting: Orthopedic Surgery

## 2021-07-02 DIAGNOSIS — I129 Hypertensive chronic kidney disease with stage 1 through stage 4 chronic kidney disease, or unspecified chronic kidney disease: Secondary | ICD-10-CM | POA: Diagnosis not present

## 2021-07-02 DIAGNOSIS — I10 Essential (primary) hypertension: Secondary | ICD-10-CM | POA: Diagnosis not present

## 2021-07-02 DIAGNOSIS — N189 Chronic kidney disease, unspecified: Secondary | ICD-10-CM | POA: Insufficient documentation

## 2021-07-02 DIAGNOSIS — M67431 Ganglion, right wrist: Secondary | ICD-10-CM | POA: Diagnosis not present

## 2021-07-02 HISTORY — PX: GANGLION CYST EXCISION: SHX1691

## 2021-07-02 SURGERY — EXCISION, GANGLION CYST, WRIST
Anesthesia: Monitor Anesthesia Care | Site: Wrist | Laterality: Right

## 2021-07-02 MED ORDER — KETOROLAC TROMETHAMINE 30 MG/ML IJ SOLN
30.0000 mg | Freq: Once | INTRAMUSCULAR | Status: AC | PRN
  Administered 2021-07-02: 30 mg via INTRAVENOUS

## 2021-07-02 MED ORDER — FENTANYL CITRATE (PF) 100 MCG/2ML IJ SOLN
INTRAMUSCULAR | Status: AC
Start: 2021-07-02 — End: ?
  Filled 2021-07-02: qty 2

## 2021-07-02 MED ORDER — KETOROLAC TROMETHAMINE 30 MG/ML IJ SOLN
INTRAMUSCULAR | Status: AC
  Filled 2021-07-02: qty 1

## 2021-07-02 MED ORDER — FENTANYL CITRATE (PF) 100 MCG/2ML IJ SOLN
INTRAMUSCULAR | Status: AC
  Filled 2021-07-02: qty 2

## 2021-07-02 MED ORDER — HYDROCODONE-ACETAMINOPHEN 5-325 MG PO TABS: ORAL_TABLET | ORAL | 0 refills | Status: DC

## 2021-07-02 MED ORDER — CLINDAMYCIN PHOSPHATE 900 MG/50ML IV SOLN
INTRAVENOUS | Status: DC | PRN
  Administered 2021-07-02: 900 mg via INTRAVENOUS

## 2021-07-02 MED ORDER — MIDAZOLAM HCL 2 MG/2ML IJ SOLN
INTRAMUSCULAR | Status: AC
Start: 2021-07-02 — End: ?
  Filled 2021-07-02: qty 2

## 2021-07-02 MED ORDER — BUPIVACAINE HCL (PF) 0.25 % IJ SOLN
INTRAMUSCULAR | Status: DC | PRN
Start: 2021-07-02 — End: 2021-07-02
  Administered 2021-07-02: 8 mL

## 2021-07-02 MED ORDER — LACTATED RINGERS IV SOLN: INTRAVENOUS | Status: DC

## 2021-07-02 MED ORDER — CEFAZOLIN SODIUM-DEXTROSE 2-4 GM/100ML-% IV SOLN
INTRAVENOUS | Status: AC
  Filled 2021-07-02: qty 100

## 2021-07-02 MED ORDER — ONDANSETRON HCL 4 MG/2ML IJ SOLN
INTRAMUSCULAR | Status: DC | PRN
Start: 2021-07-02 — End: 2021-07-02
  Administered 2021-07-02: 4 mg via INTRAVENOUS

## 2021-07-02 MED ORDER — 0.9 % SODIUM CHLORIDE (POUR BTL) OPTIME
TOPICAL | Status: DC | PRN
  Administered 2021-07-02: 60 mL

## 2021-07-02 MED ORDER — ROPIVACAINE HCL 5 MG/ML IJ SOLN
INTRAMUSCULAR | Status: DC | PRN
  Administered 2021-07-02: 20 mL via PERINEURAL

## 2021-07-02 MED ORDER — FENTANYL CITRATE (PF) 100 MCG/2ML IJ SOLN
INTRAMUSCULAR | Status: DC | PRN
  Administered 2021-07-02: 25 ug via INTRAVENOUS

## 2021-07-02 MED ORDER — PROPOFOL 500 MG/50ML IV EMUL
INTRAVENOUS | Status: DC | PRN
Start: 2021-07-02 — End: 2021-07-02
  Administered 2021-07-02: 100 ug/kg/min via INTRAVENOUS

## 2021-07-02 MED ORDER — ONDANSETRON HCL 4 MG/2ML IJ SOLN: 4.0000 mg | Freq: Once | INTRAMUSCULAR | Status: DC | PRN

## 2021-07-02 MED ORDER — FENTANYL CITRATE (PF) 100 MCG/2ML IJ SOLN
25.0000 ug | INTRAMUSCULAR | Status: DC | PRN
  Administered 2021-07-02: 50 ug via INTRAVENOUS

## 2021-07-02 MED ORDER — FENTANYL CITRATE (PF) 100 MCG/2ML IJ SOLN
100.0000 ug | Freq: Once | INTRAMUSCULAR | Status: AC
  Administered 2021-07-02: 100 ug via INTRAVENOUS

## 2021-07-02 MED ORDER — MIDAZOLAM HCL 2 MG/2ML IJ SOLN
2.0000 mg | Freq: Once | INTRAMUSCULAR | Status: AC
  Administered 2021-07-02: 2 mg via INTRAVENOUS

## 2021-07-02 MED ORDER — OXYCODONE HCL 5 MG PO TABS: 5.0000 mg | ORAL_TABLET | Freq: Once | ORAL | Status: DC | PRN

## 2021-07-02 MED ORDER — CEFAZOLIN SODIUM-DEXTROSE 2-4 GM/100ML-% IV SOLN: 2.0000 g | INTRAVENOUS | Status: DC

## 2021-07-02 MED ORDER — LIDOCAINE-EPINEPHRINE 2 %-1:100000 IJ SOLN
INTRAMUSCULAR | Status: DC | PRN
  Administered 2021-07-02: 10 mL via PERINEURAL

## 2021-07-02 MED ORDER — OXYCODONE HCL 5 MG/5ML PO SOLN: 5.0000 mg | Freq: Once | ORAL | Status: DC | PRN

## 2021-07-02 MED ORDER — BUPIVACAINE HCL (PF) 0.25 % IJ SOLN
INTRAMUSCULAR | Status: AC
  Filled 2021-07-02: qty 30

## 2021-07-02 SURGICAL SUPPLY — 44 items
APL PRP STRL LF DISP 70% ISPRP (MISCELLANEOUS) ×1
APL SKNCLS STERI-STRIP NONHPOA (GAUZE/BANDAGES/DRESSINGS) ×1
BENZOIN TINCTURE PRP APPL 2/3 (GAUZE/BANDAGES/DRESSINGS) ×1 IMPLANT
BLADE SURG 15 STRL LF DISP TIS (BLADE) ×2 IMPLANT
BLADE SURG 15 STRL SS (BLADE) ×4
BNDG CMPR 9X4 STRL LF SNTH (GAUZE/BANDAGES/DRESSINGS) ×1
BNDG ELASTIC 3X5.8 VLCR STR LF (GAUZE/BANDAGES/DRESSINGS) ×2 IMPLANT
BNDG ESMARK 4X9 LF (GAUZE/BANDAGES/DRESSINGS) ×1 IMPLANT
BNDG GAUZE ELAST 4 BULKY (GAUZE/BANDAGES/DRESSINGS) ×2 IMPLANT
CHLORAPREP W/TINT 26 (MISCELLANEOUS) ×2 IMPLANT
CORD BIPOLAR FORCEPS 12FT (ELECTRODE) ×2 IMPLANT
COVER BACK TABLE 60X90IN (DRAPES) ×2 IMPLANT
COVER MAYO STAND STRL (DRAPES) ×2 IMPLANT
DRAPE EXTREMITY T 121X128X90 (DISPOSABLE) ×2 IMPLANT
DRAPE SURG 17X23 STRL (DRAPES) ×2 IMPLANT
DRSG PAD ABDOMINAL 8X10 ST (GAUZE/BANDAGES/DRESSINGS) ×1 IMPLANT
GAUZE SPONGE 4X4 12PLY STRL (GAUZE/BANDAGES/DRESSINGS) ×2 IMPLANT
GAUZE XEROFORM 1X8 LF (GAUZE/BANDAGES/DRESSINGS) ×2 IMPLANT
GLOVE SRG 8 PF TXTR STRL LF DI (GLOVE) ×1 IMPLANT
GLOVE SURG ENC MOIS LTX SZ7.5 (GLOVE) ×2 IMPLANT
GLOVE SURG POLYISO LF SZ6.5 (GLOVE) ×1 IMPLANT
GLOVE SURG POLYISO LF SZ7 (GLOVE) ×1 IMPLANT
GLOVE SURG UNDER POLY LF SZ6.5 (GLOVE) ×1 IMPLANT
GLOVE SURG UNDER POLY LF SZ7 (GLOVE) ×2 IMPLANT
GLOVE SURG UNDER POLY LF SZ8 (GLOVE) ×2
GOWN STRL REUS W/ TWL LRG LVL3 (GOWN DISPOSABLE) ×1 IMPLANT
GOWN STRL REUS W/ TWL XL LVL3 (GOWN DISPOSABLE) IMPLANT
GOWN STRL REUS W/TWL LRG LVL3 (GOWN DISPOSABLE) ×2
GOWN STRL REUS W/TWL XL LVL3 (GOWN DISPOSABLE) ×4 IMPLANT
NDL HYPO 25X1 1.5 SAFETY (NEEDLE) IMPLANT
NEEDLE HYPO 25X1 1.5 SAFETY (NEEDLE) ×2 IMPLANT
NS IRRIG 1000ML POUR BTL (IV SOLUTION) ×2 IMPLANT
PACK BASIN DAY SURGERY FS (CUSTOM PROCEDURE TRAY) ×2 IMPLANT
PADDING CAST ABS 4INX4YD NS (CAST SUPPLIES) ×1
PADDING CAST ABS COTTON 4X4 ST (CAST SUPPLIES) ×1 IMPLANT
SLING ARM FOAM STRAP LRG (SOFTGOODS) ×1 IMPLANT
STOCKINETTE 4X48 STRL (DRAPES) ×2 IMPLANT
STRIP CLOSURE SKIN 1/2X4 (GAUZE/BANDAGES/DRESSINGS) ×1 IMPLANT
SUT MNCRL AB 4-0 PS2 18 (SUTURE) ×1 IMPLANT
SUT VIC AB 4-0 P2 18 (SUTURE) ×1 IMPLANT
SYR BULB EAR ULCER 3OZ GRN STR (SYRINGE) ×2 IMPLANT
SYR CONTROL 10ML LL (SYRINGE) ×1 IMPLANT
TOWEL GREEN STERILE FF (TOWEL DISPOSABLE) ×3 IMPLANT
UNDERPAD 30X36 HEAVY ABSORB (UNDERPADS AND DIAPERS) ×2 IMPLANT

## 2021-07-02 NOTE — Progress Notes (Signed)
Assisted Dr. Kalman Shan with right, ultrasound guided, axillary block. Side rails up, monitors on throughout procedure. See vital signs in flow sheet. Tolerated Procedure well. ?

## 2021-07-02 NOTE — Op Note (Signed)
NAME: [name] ?MEDICAL RECORD NO: [id number] ?DATE OF BIRTH: 1965/08/24 ?FACILITY: [facility] ?LOCATION: [place] ?PHYSICIAN: Tennis Must, MD ?  ?OPERATIVE REPORT ?  ?DATE OF PROCEDURE: 07/02/21  ?  ?PREOPERATIVE DIAGNOSIS: Right wrist dorsal ganglion cyst ?  ?POSTOPERATIVE DIAGNOSIS: Right wrist dorsal ganglion cyst ?  ?PROCEDURE: Excision of dorsal ganglion cyst right wrist ?  ?SURGEON:  Leanora Cover, M.D. ?  ?ASSISTANT: none ?  ?ANESTHESIA:  Regional with sedation ?  ?INTRAVENOUS FLUIDS:  Per anesthesia flow sheet. ?  ?ESTIMATED BLOOD LOSS:  Minimal. ?  ?COMPLICATIONS:  None. ?  ?SPECIMENS: Right wrist dorsal ganglion to pathology ?  ?TOURNIQUET TIME:   ? ?Total Tourniquet Time Documented: ?Upper Arm (Right) - 22 minutes ?Total: Upper Arm (Right) - 22 minutes ? ?  ?DISPOSITION:  Stable to PACU. ?  ?INDICATIONS: 56 year old female has had a mass on the dorsum of the right wrist for approximately 6 months.  It is bothersome to her.  She wishes to have it removed.  Risks, benefits and alternatives of surgery were discussed including the risks of blood loss, infection, damage to nerves, vessels, tendons, ligaments, bone for surgery, need for additional surgery, complications with wound healing, continued pain, stiffness, , recurrence.  She voiced understanding of these risks and elected to proceed. ? ?OPERATIVE COURSE:  After being identified preoperatively by myself,  the patient and I agreed on the procedure and site of the procedure.  The surgical site was marked.  Surgical consent had been signed. She was given IV antibiotics as preoperative antibiotic prophylaxis. She was transferred to the operating room and placed on the operating table in supine position with the Right upper extremity on an arm board.  Sedation was induced by the anesthesiologist. A regional block had been performed by anesthesia in preoperative holding.    Right upper extremity was prepped and draped in normal  sterile orthopedic fashion.  A surgical pause was performed between the surgeons, anesthesia, and operating room staff and all were in agreement as to the patient, procedure, and site of procedure.  Tourniquet at the proximal aspect of the extremity was inflated to 250 mmHg after exsanguination of the arm with an Esmarch bandage.  Transverse incision was made over the mass at the dorsum of the right wrist.  This was carried into subcutaneous tissues by spreading technique.  Quarter percent plain Marcaine was injected proximal to the incision to aid in intraoperative analgesia.  Bipolar electrocautery was used to obtain hemostasis.  Mass was carefully freed up from soft tissue attachments.  It had a stalk coming from the dorsum of the wrist joint.  The stalk was transected and the cyst removed.  The cyst was sent to pathology for examination.  The rent in the capsule was repaired with a 4-0 Vicryl suture in a figure-of-eight fashion.  The wound was copiously irrigated with sterile saline.  Inverted interrupted Vicryl sutures were placed in subcutaneous tissues and the skin was closed with a running subcuticular 4-0 Monocryl suture.  This was augmented with benzoin and Steri-Strips.  The wound was dressed with sterile 4 x 4's and an ABD used as a splint.  This was wrapped with Kerlix and Ace bandage.  The tourniquet was deflated at 22 minutes.  Fingertips were pink with brisk capillary refill after deflation of tourniquet.  The operative  drapes were broken down.  The patient was awoken from anesthesia safely.  She was transferred back to the stretcher and taken to  PACU in stable condition.  I will see her back in the office in 1 week for postoperative followup.  I will give her a prescription for Norco 5/325 1-2 tabs PO q6 hours prn pain, dispense # 15. ? ? ?Leanora Cover, MD ?Electronically signed, 07/02/21 ?

## 2021-07-02 NOTE — Anesthesia Procedure Notes (Signed)
Anesthesia Regional Block: Axillary brachial plexus block  ? ?Pre-Anesthetic Checklist: , timeout performed,  Correct Patient, Correct Site, Correct Laterality,  Correct Procedure, Correct Position, site marked,  Risks and benefits discussed,  Surgical consent,  Pre-op evaluation,  At surgeon's request and post-op pain management ? ?Laterality: Right ? ?Prep: chloraprep     ?  ?Needles:  ?Injection technique: Single-shot ? ?Needle Type: Echogenic Needle   ? ? ?Needle Length: 9cm  ? ? ? ? ?Additional Needles: ? ? ?Procedures:,,,, ultrasound used (permanent image in chart),,    ?Narrative:  ?Start time: 07/02/2021 12:11 PM ?End time: 07/02/2021 12:18 PM ?Injection made incrementally with aspirations every 5 mL. ? ?Performed by: Personally  ?Anesthesiologist: Myrtie Soman, MD ? ?Additional Notes: ?Patient tolerated the procedure well without complications ? ? ? ?

## 2021-07-02 NOTE — H&P (Signed)
?[name] is an 56 y.o. female.   ?Chief Complaint: ganglion ?HPI: 63 y female with mass on dorsum right wrist x 6 months.  It is bothersome to her.  She wishes to have it removed. ? ?Allergies:  ?Allergies  ?Allergen Reactions  ? Tetracycline Hcl Shortness Of Breath, Swelling and Other (See Comments)  ?  Large purple bruise  ? Angiotensin Receptor Blockers Swelling  ?  Had episode of angioedema on Losartan   ? Nickel Dermatitis and Itching  ? Septra [Bactrim] Itching, Swelling and Other (See Comments)  ?  Purple bruise  ? Sulfamethoxazole-Trimethoprim Itching, Swelling and Other (See Comments)  ?  Purple bruise  ? ? ?Past Medical History:  ?Diagnosis Date  ? Allergy   ? Arthritis   ? Lt ankle  ? Chronic kidney disease   ? right renal cyst   ? GERD (gastroesophageal reflux disease)   ? hx of no problems at present   ? Headache(784.0)   ? occasional headache   ? Hypertension   ? Influenza 04/20/2012  ? PONV (postoperative nausea and vomiting)   ? N/V and trouble urinating after surgery   ? ? ?Past Surgical History:  ?Procedure Laterality Date  ? BREAST EXCISIONAL BIOPSY Left   ? BREAST SURGERY  2010  ? lumpectomy Lt breast  ? CARPAL TUNNEL RELEASE Right 08/06/2015  ? Procedure: RIGHT CARPAL TUNNEL RELEASE;  Surgeon: Leanora Cover, MD;  Location: [facility];  Service: Orthopedics;  Laterality: Right;  ? DORSAL COMPARTMENT RELEASE Right 08/06/2015  ? Procedure: RIGHT FIRST RELEASE DORSAL COMPARTMENT (DEQUERVAIN);  Surgeon: Leanora Cover, MD;  Location: [facility];  Service: Orthopedics;  Laterality: Right;  ? ENDOMETRIAL ABLATION  October 18 2010  ? at [facility] [place]  ? FRACTURE SURGERY  2011  ? Lt ankle surgeries x 3   ? JOINT REPLACEMENT    ? left ankle 8/12   ? LAPAROSCOPIC NEPHRECTOMY  03/09/2011  ? Procedure: LAPAROSCOPIC NEPHRECTOMY;  Surgeon: Franchot Gallo;  Location: [facility];  Service: Urology;  Laterality: Right;  Laparoscopic Assisted Removal Of Renal Cyst   ? OTHER SURGICAL  HISTORY    ? 2 hand surgeries on left and shoulder surgery on right shoulder   ? TOTAL ANKLE ARTHROPLASTY Left 07/05/2012  ? Procedure: Removal of total ankle implants,I & D with insertion of  new poly spacer;  Surgeon: Wylene Simmer, MD;  Location: [facility];  Service: Orthopedics;  Laterality: Left;  ? TUBAL LIGATION  1988  ? ? ?Family History: ?Family History  ?Problem Relation Age of Onset  ? Diabetes Mother   ? Heart disease Mother   ? Hypertension Father   ? Diabetes Sister   ? Kidney disease Sister   ? Hypertension Brother   ? Kidney disease Brother   ? Arthritis Brother   ? Cancer Maternal Grandmother   ? Diabetes Sister   ? Hypertension Sister   ? Hypertension Sister   ? Hypertension Sister   ? Hypertension Brother   ? Mental illness Brother   ? Breast cancer Niece   ? Colon cancer Neg Hx   ? ? ?Social History:  ? reports that she has never smoked. She has never used smokeless tobacco. She reports that she does not drink alcohol and does not use drugs. ? ?Medications: ?Medications Prior to Admission  ?Medication Sig Dispense Refill  ? ASPIRIN 81 PO Take by mouth.    ? indapamide (LOZOL) 1.25 MG tablet TAKE 1 TABLET BY MOUTH EVERY  DAY 90 tablet 2  ? rosuvastatin (CRESTOR) 5 MG tablet Take 1 tablet (5 mg total) by mouth daily. 90 tablet 3  ? spironolactone (ALDACTONE) 25 MG tablet TAKE 1 TABLET BY MOUTH EVERY DAY 90 tablet 1  ? ? ?No results found for this or any previous visit (from the past 48 hour(s)). ? ?No results found. ? ? ? ?Blood pressure (!) 146/66, pulse (!) 52, temperature (!) 97.2 ?F (36.2 ?C), temperature source Oral, resp. rate 18, height 5\' 8"  (1.727 m), weight 100.8 kg, SpO2 99 %. ? ?General appearance: alert, cooperative, and appears stated age ?Head: Normocephalic, without obvious abnormality, atraumatic ?Neck: supple, symmetrical, trachea midline ?Extremities: Intact sensation and capillary refill all digits.  +epl/fpl/io.  No wounds.  ?Pulses: 2+ and symmetric ?Skin: Skin color, texture, turgor  normal. No rashes or lesions ?Neurologic: Grossly normal ?Incision/Wound: none ? ?Assessment/Plan ?Right wrist dorsal ganglion.  Non operative and operative treatment options have been discussed with the patient and patient wishes to proceed with operative treatment. Risks, benefits, and alternatives of surgery have been discussed and the patient agrees with the plan of care.  ? ?Leanora Cover ?07/02/2021, 11:50 AM ? ?

## 2021-07-02 NOTE — Transfer of Care (Signed)
Immediate Anesthesia Transfer of Care Note ? ?Patient: [name] ? ?Procedure(s) Performed: RIGHT WRIST EXCISION DORSAL GANGLION (Right: Wrist) ? ?Patient Location: PACU ? ?Anesthesia Type:MAC and Regional ? ?Level of Consciousness: awake, alert  and oriented ? ?Airway & Oxygen Therapy: Patient Spontanous Breathing and Patient connected to face mask oxygen ? ?Post-op Assessment: Report given to RN and Post -op Vital signs reviewed and stable ? ?Post vital signs: Reviewed and stable ? ?Last Vitals:  ?Vitals Value Taken Time  ?BP    ?Temp    ?Pulse 46 07/02/21 1350  ?Resp    ?SpO2 98 % 07/02/21 1350  ?Vitals shown include unvalidated device data. ? ?Last Pain:  ?Vitals:  ? 07/02/21 1137  ?TempSrc: Oral  ?PainSc: 0-No pain  ?   ? ?  ? ?Complications: No notable events documented. ?

## 2021-07-02 NOTE — Anesthesia Preprocedure Evaluation (Signed)
Anesthesia Evaluation  ?Patient identified by MRN, date of birth, ID band ?Patient awake ? ? ? ?Reviewed: ?Allergy & Precautions, NPO status , Patient's Chart, lab work & pertinent test results ? ?History of Anesthesia Complications ?(+) PONV and history of anesthetic complications ? ?Airway ?Mallampati: II ? ?TM Distance: >3 FB ?Neck ROM: Full ? ? ? Dental ?no notable dental hx. ? ?  ?Pulmonary ?neg pulmonary ROS,  ?  ?Pulmonary exam normal ?breath sounds clear to auscultation ? ? ? ? ? ? Cardiovascular ?hypertension, Normal cardiovascular exam ?Rhythm:Regular Rate:Normal ? ? ?  ?Neuro/Psych ?negative neurological ROS ? negative psych ROS  ? GI/Hepatic ?negative GI ROS, Neg liver ROS,   ?Endo/Other  ?negative endocrine ROS ? Renal/GU ?negative Renal ROS  ?negative genitourinary ?  ?Musculoskeletal ?negative musculoskeletal ROS ?(+)  ? Abdominal ?  ?Peds ?negative pediatric ROS ?(+)  Hematology ?negative hematology ROS ?(+)   ?Anesthesia Other Findings ? ? Reproductive/Obstetrics ?negative OB ROS ? ?  ? ? ? ? ? ? ? ? ? ? ? ? ? ?  ?  ? ? ? ? ? ? ? ? ?Anesthesia Physical ?Anesthesia Plan ? ?ASA: 2 ? ?Anesthesia Plan: MAC  ? ?Post-op Pain Management: Regional block*  ? ?Induction: Intravenous ? ?PONV Risk Score and Plan: 3 and Ondansetron, Dexamethasone, Propofol infusion and Treatment may vary due to age or medical condition ? ?Airway Management Planned: Simple Face Mask ? ?Additional Equipment:  ? ?Intra-op Plan:  ? ?Post-operative Plan:  ? ?Informed Consent: I have reviewed the patients History and Physical, chart, labs and discussed the procedure including the risks, benefits and alternatives for the proposed anesthesia with the patient or authorized representative who has indicated his/her understanding and acceptance.  ? ? ? ?Dental advisory given ? ?Plan Discussed with: CRNA and Surgeon ? ?Anesthesia Plan Comments:   ? ? ? ? ? ? ?Anesthesia Quick Evaluation ? ?

## 2021-07-02 NOTE — Anesthesia Procedure Notes (Signed)
Anesthesia Procedure Image    

## 2021-07-02 NOTE — Discharge Instructions (Addendum)
[facility] Instructions ?Hand Surgery ? ?Wound Care: ?Keep your hand elevated above the level of your heart.  Do not allow it to dangle by your side.  Keep the dressing dry and do not remove it unless your doctor advises you to do so.  He will usually change it at the time of your post-op visit.  Moving your fingers is advised to stimulate circulation but will depend on the site of your surgery.  If you have a splint applied, your doctor will advise you regarding movement. ? ?Activity: ?Do not drive or operate machinery today.  Rest today and then you may return to your normal activity and work as indicated by your physician. ? ?Diet:  ?Drink liquids today or eat a light diet.  You may resume a regular diet tomorrow.   ? ?General expectations: ?Pain for two to three days. ?Fingers may become slightly swollen. ? ?Call your doctor if any of the following occur: ?Severe pain not relieved by pain medication. ?Elevated temperature. ?Dressing soaked with blood. ?Inability to move fingers. ?White or bluish color to fingers. ? ? ? ?Post Anesthesia Home Care Instructions ? ?Activity: ?Get plenty of rest for the remainder of the day. A responsible individual must stay with you for 24 hours following the procedure.  ?For the next 24 hours, DO NOT: ?-Drive a car ?-Paediatric nurse ?-Drink alcoholic beverages ?-Take any medication unless instructed by your physician ?-Make any legal decisions or sign important papers. ? ?Meals: ?Start with liquid foods such as gelatin or soup. Progress to regular foods as tolerated. Avoid greasy, spicy, heavy foods. If nausea and/or vomiting occur, drink only clear liquids until the nausea and/or vomiting subsides. Call your physician if vomiting continues. ? ?Special Instructions/Symptoms: ?Your throat may feel dry or sore from the anesthesia or the breathing tube placed in your throat during surgery. If this causes discomfort, gargle with warm salt water. The discomfort should disappear  within 24 hours. ? ?If you had a scopolamine patch placed behind your ear for the management of post- operative nausea and/or vomiting: ? ?1. The medication in the patch is effective for 72 hours, after which it should be removed.  Wrap patch in a tissue and discard in the trash. Wash hands thoroughly with soap and water. ?2. You may remove the patch earlier than 72 hours if you experience unpleasant side effects which may include dry mouth, dizziness or visual disturbances. ?3. Avoid touching the patch. Wash your hands with soap and water after contact with the patch. ?    ? ?Regional Anesthesia Blocks ? ?1. Numbness or the inability to move the "blocked" extremity may last from 3-48 hours after placement. The length of time depends on the medication injected and your individual response to the medication. If the numbness is not going away after 48 hours, call your surgeon. ? ?2. The extremity that is blocked will need to be protected until the numbness is gone and the  Strength has returned. Because you cannot feel it, you will need to take extra care to avoid injury. Because it may be weak, you may have difficulty moving it or using it. You may not know what position it is in without looking at it while the block is in effect. ? ?3. For blocks in the legs and feet, returning to weight bearing and walking needs to be done carefully. You will need to wait until the numbness is entirely gone and the strength has returned. You should be able to  move your leg and foot normally before you try and bear weight or walk. You will need someone to be with you when you first try to ensure you do not fall and possibly risk injury. ? ?4. Bruising and tenderness at the needle site are common side effects and will resolve in a few days. ? ?5. Persistent numbness or new problems with movement should be communicated to the surgeon or the [facility] [phone] [place] [phone]).  ? ?No  ibuprofen today until after 10pm if needed. ? ?

## 2021-07-05 ENCOUNTER — Encounter (HOSPITAL_BASED_OUTPATIENT_CLINIC_OR_DEPARTMENT_OTHER): Payer: Self-pay | Admitting: Orthopedic Surgery

## 2021-07-05 LAB — SURGICAL PATHOLOGY

## 2021-07-05 NOTE — Anesthesia Postprocedure Evaluation (Signed)
Anesthesia Post Note ? ?Patient: [name] ? ?Procedure(s) Performed: RIGHT WRIST EXCISION DORSAL GANGLION (Right: Wrist) ? ?  ? ?Patient location during evaluation: PACU ?Anesthesia Type: MAC ?Level of consciousness: awake and alert ?Pain management: pain level controlled ?Vital Signs Assessment: post-procedure vital signs reviewed and stable ?Respiratory status: spontaneous breathing, nonlabored ventilation, respiratory function stable and patient connected to nasal cannula oxygen ?Cardiovascular status: stable and blood pressure returned to baseline ?Postop Assessment: no apparent nausea or vomiting ?Anesthetic complications: no ? ? ?No notable events documented. ? ?Last Vitals:  ?Vitals:  ? 07/02/21 1430 07/02/21 1505  ?BP: (!) 148/79 (!) 145/57  ?Pulse: (!) 51 (!) 53  ?Resp: 15 16  ?Temp:  36.6 ?C  ?SpO2: 97% 95%  ?  ?Last Pain:  ?Vitals:  ? 07/02/21 1505  ?TempSrc: Oral  ?PainSc: 0-No pain  ? ? ?  ?  ?  ?  ?  ?  ? ?Jager Koska S ? ? ? ? ?

## 2021-07-06 ENCOUNTER — Other Ambulatory Visit: Payer: Self-pay

## 2021-07-20 ENCOUNTER — Other Ambulatory Visit: Payer: Self-pay | Admitting: Family Medicine

## 2021-08-24 ENCOUNTER — Ambulatory Visit (INDEPENDENT_AMBULATORY_CARE_PROVIDER_SITE_OTHER): Payer: BC Managed Care – PPO | Admitting: Podiatry

## 2021-08-24 DIAGNOSIS — L84 Corns and callosities: Secondary | ICD-10-CM | POA: Diagnosis not present

## 2021-08-24 DIAGNOSIS — M216X2 Other acquired deformities of left foot: Secondary | ICD-10-CM

## 2021-08-24 NOTE — Patient Instructions (Addendum)
Look for urea 40% cream or ointment and apply to the thickened dry skin / calluses. This can be bought over the counter, at a pharmacy or online such as [employer]. ? ?If the padding is helpful, call me and we will get you scheduled to have orthotics made ?the ?

## 2021-08-27 NOTE — Progress Notes (Signed)
?  Subjective:  ?Patient ID: [name], female    DOB: 1965/07/18,  MRN: [id number] ? ?Chief Complaint  ?Patient presents with  ? Callouses  ?  Left first and fifth submet, very painful  ? ? ?56 y.o. female presents with the above complaint. History confirmed with patient.  The worst is the left foot under the first metatarsal head.  She had ankle fusion after failed ankle replacement and this is left her in a plantarflexed position ? ?Objective:  ?Physical Exam: ?warm, good capillary refill, no trophic changes or ulcerative lesions, normal DP and PT pulses, and normal sensory exam. ?Left Foot: Submetatarsal 5 and 1 hyperkeratosis ? ?Assessment:  ? ?1. Callus of foot   ?2. Plantar flexed metatarsal bone of left foot   ? ? ? ?Plan:  ?Patient was evaluated and treated and all questions answered. ? ?We discussed the biomechanics of her fixed ankle position and how her first metatarsal is bearing most of the weight and causing a large callus today.  I put a dancers pad in her shoe to offload it and this was helpful.  We also discussed treating the skin itself with urea cream.  I think she would benefit greatly from a custom molded orthotic with an offload and first ray cut out and metatarsal pad.  She will be scheduled for this when she is ready. ? ?Return if symptoms worsen or fail to improve.  ? ?

## 2021-10-05 ENCOUNTER — Encounter: Payer: Self-pay | Admitting: *Deleted

## 2021-10-26 ENCOUNTER — Encounter: Payer: Self-pay | Admitting: Family Medicine

## 2021-10-26 ENCOUNTER — Other Ambulatory Visit: Payer: Self-pay

## 2021-10-26 ENCOUNTER — Ambulatory Visit (INDEPENDENT_AMBULATORY_CARE_PROVIDER_SITE_OTHER): Payer: BC Managed Care – PPO | Admitting: Family Medicine

## 2021-10-26 DIAGNOSIS — R5383 Other fatigue: Secondary | ICD-10-CM

## 2021-10-26 DIAGNOSIS — E785 Hyperlipidemia, unspecified: Secondary | ICD-10-CM

## 2021-10-26 DIAGNOSIS — I10 Essential (primary) hypertension: Secondary | ICD-10-CM | POA: Diagnosis not present

## 2021-10-26 NOTE — Assessment & Plan Note (Signed)
Unable to tolerate lipitor is doing better with crestor.  If fatigue persists might trial off all statins

## 2021-10-26 NOTE — Assessment & Plan Note (Signed)
Given brief duration could be transient viral cause or perhaps mild electrolyte disturbance from drinking excess water.  Cut down on this monitor her symptoms.  No signs of infection or cancer

## 2021-11-15 ENCOUNTER — Other Ambulatory Visit: Payer: Self-pay | Admitting: Family Medicine

## 2021-12-01 DIAGNOSIS — M67431 Ganglion, right wrist: Secondary | ICD-10-CM | POA: Diagnosis not present

## 2021-12-07 ENCOUNTER — Ambulatory Visit (INDEPENDENT_AMBULATORY_CARE_PROVIDER_SITE_OTHER): Payer: BC Managed Care – PPO

## 2021-12-07 ENCOUNTER — Ambulatory Visit (INDEPENDENT_AMBULATORY_CARE_PROVIDER_SITE_OTHER): Payer: BC Managed Care – PPO | Admitting: Podiatry

## 2021-12-07 DIAGNOSIS — M779 Enthesopathy, unspecified: Secondary | ICD-10-CM | POA: Diagnosis not present

## 2021-12-07 DIAGNOSIS — M21962 Unspecified acquired deformity of left lower leg: Secondary | ICD-10-CM | POA: Diagnosis not present

## 2021-12-07 DIAGNOSIS — M216X2 Other acquired deformities of left foot: Secondary | ICD-10-CM | POA: Diagnosis not present

## 2021-12-07 DIAGNOSIS — M19072 Primary osteoarthritis, left ankle and foot: Secondary | ICD-10-CM

## 2021-12-07 DIAGNOSIS — M216X1 Other acquired deformities of right foot: Secondary | ICD-10-CM

## 2021-12-12 NOTE — Progress Notes (Signed)
  Subjective:  Patient ID: [name], female    DOB: Jul 10, 1965,  MRN: [id number]  Chief Complaint  Patient presents with   Foot Pain    Patient complaining of pain to top of left foot. Patient describes pain as a shooting pain. Patient believes it is getting worst. Patient is wearing taps instead of the steel toe shoes at work but it is still heavy.     56 y.o. female presents with the above complaint. History confirmed with patient.  She still having similar pain out of the calluses that she was having before she has been working on these with the cream.  She is having new pain in the front of the top of the foot  Objective:  Physical Exam: warm, good capillary refill, no trophic changes or ulcerative lesions, normal DP and PT pulses, and normal sensory exam. Left Foot: Submetatarsal 5 and 1 hyperkeratosis, prominent metatarsal heads, tenderness here, most of her tenderness now is also in the dorsal portions of the navicular cuneiform joints.  Assessment:   1. Arthritis of midtarsal joint of left foot   2. Plantar flexed metatarsal bone of left foot   3. Acquired deformity of foot, left   4. Acquired deformity of left ankle and foot      Plan:  Patient was evaluated and treated and all questions answered.  We again discussed how her ankle fusion has lead to altered biomechanical function of the midfoot.  This is contributing to increased plantar forefoot pressures and I do recommend still on a custom molded foot orthosis bilaterally.  She was casted and fitted for these today.  Specifically we will try to offload and unload the pressure points in the forefoot.  She will be notified when these are dispensed.  She also appears to be developing compensatory capsulitis and inflammation and osteoarthritis of the navicular cuneiform and tarsometatarsal joints.  She is having significant tenderness in these areas.  I recommended corticosteroid injection.  Following sterile prep  with Betadine 1 cc of dexamethasone and 0.5 cc of Marcaine was injected into this area for temporary relief.  Hopefully can avoid any further surgery on this foot but has had quite a bit already.  I do not see any evidence or signs of infection that is developed in the foot or direct complications of her previous surgeries currently.  No follow-ups on file.

## 2021-12-23 DIAGNOSIS — R52 Pain, unspecified: Secondary | ICD-10-CM | POA: Diagnosis not present

## 2021-12-23 DIAGNOSIS — M25631 Stiffness of right wrist, not elsewhere classified: Secondary | ICD-10-CM | POA: Diagnosis not present

## 2021-12-23 DIAGNOSIS — M67431 Ganglion, right wrist: Secondary | ICD-10-CM | POA: Diagnosis not present

## 2021-12-27 ENCOUNTER — Telehealth: Payer: Self-pay | Admitting: Podiatry

## 2021-12-27 NOTE — Telephone Encounter (Signed)
LEFT MESSAGE TO SCHEDULE APPT FOR OPU

## 2021-12-30 DIAGNOSIS — M67431 Ganglion, right wrist: Secondary | ICD-10-CM | POA: Diagnosis not present

## 2021-12-30 DIAGNOSIS — M25631 Stiffness of right wrist, not elsewhere classified: Secondary | ICD-10-CM | POA: Diagnosis not present

## 2021-12-30 DIAGNOSIS — R52 Pain, unspecified: Secondary | ICD-10-CM | POA: Diagnosis not present

## 2022-01-12 DIAGNOSIS — R52 Pain, unspecified: Secondary | ICD-10-CM | POA: Diagnosis not present

## 2022-01-12 DIAGNOSIS — M6740 Ganglion, unspecified site: Secondary | ICD-10-CM | POA: Diagnosis not present

## 2022-01-12 DIAGNOSIS — M25631 Stiffness of right wrist, not elsewhere classified: Secondary | ICD-10-CM | POA: Diagnosis not present

## 2022-01-12 DIAGNOSIS — M67431 Ganglion, right wrist: Secondary | ICD-10-CM | POA: Diagnosis not present

## 2022-01-13 ENCOUNTER — Ambulatory Visit (INDEPENDENT_AMBULATORY_CARE_PROVIDER_SITE_OTHER): Payer: BC Managed Care – PPO

## 2022-01-13 DIAGNOSIS — M722 Plantar fascial fibromatosis: Secondary | ICD-10-CM

## 2022-01-13 DIAGNOSIS — M779 Enthesopathy, unspecified: Secondary | ICD-10-CM

## 2022-01-13 NOTE — Progress Notes (Signed)
Patient presents today to pick up custom molded foot orthotics, diagnosed with callus  by Dr. Sherryle Lis.   Orthotics were dispensed and fit was satisfactory. Reviewed instructions for break-in and wear. Written instructions given to patient.  Patient will follow up as needed.   [facility] - order # [id number]

## 2022-01-19 ENCOUNTER — Other Ambulatory Visit: Payer: BC Managed Care – PPO

## 2022-01-19 DIAGNOSIS — M67431 Ganglion, right wrist: Secondary | ICD-10-CM | POA: Diagnosis not present

## 2022-01-21 ENCOUNTER — Other Ambulatory Visit: Payer: Self-pay | Admitting: Family Medicine

## 2022-01-26 DIAGNOSIS — M67431 Ganglion, right wrist: Secondary | ICD-10-CM | POA: Diagnosis not present

## 2022-01-27 ENCOUNTER — Other Ambulatory Visit: Payer: Self-pay | Admitting: Orthopedic Surgery

## 2022-01-31 ENCOUNTER — Other Ambulatory Visit: Payer: Self-pay | Admitting: Family Medicine

## 2022-01-31 DIAGNOSIS — Z1231 Encounter for screening mammogram for malignant neoplasm of breast: Secondary | ICD-10-CM

## 2022-03-01 ENCOUNTER — Telehealth: Payer: Self-pay

## 2022-03-01 NOTE — Patient Outreach (Signed)
  Care Coordination   03/01/2022 Name: [name] MRN: [id number] DOB: 1965/11/27   Care Coordination Outreach Attempts:  An unsuccessful telephone outreach was attempted today to offer the patient information about available care coordination services as a benefit of their health plan.   Follow Up Plan:  Additional outreach attempts will be made to offer the patient care coordination information and services.   Encounter Outcome:  No Answer  Care Coordination Interventions Activated:  No   Care Coordination Interventions:  No, not indicated    Jone Baseman, RN, MSN [facility] Care Management Care Management Coordinator Direct Line [phone]

## 2022-03-02 ENCOUNTER — Other Ambulatory Visit: Payer: Self-pay

## 2022-03-02 ENCOUNTER — Encounter (HOSPITAL_BASED_OUTPATIENT_CLINIC_OR_DEPARTMENT_OTHER): Payer: Self-pay | Admitting: Orthopedic Surgery

## 2022-03-03 ENCOUNTER — Ambulatory Visit
Admission: RE | Admit: 2022-03-03 | Discharge: 2022-03-03 | Disposition: A | Discharge status: Home / Self Care | Payer: BC Managed Care – PPO | Source: Ambulatory Visit | Attending: Family Medicine | Admitting: Family Medicine

## 2022-03-03 DIAGNOSIS — Z1231 Encounter for screening mammogram for malignant neoplasm of breast: Secondary | ICD-10-CM | POA: Diagnosis not present

## 2022-03-07 ENCOUNTER — Encounter (HOSPITAL_BASED_OUTPATIENT_CLINIC_OR_DEPARTMENT_OTHER)
Admission: RE | Admit: 2022-03-07 | Discharge: 2022-03-07 | Disposition: A | Discharge status: Home / Self Care | Payer: BC Managed Care – PPO | Source: Ambulatory Visit | Attending: Orthopedic Surgery | Admitting: Orthopedic Surgery

## 2022-03-07 DIAGNOSIS — I129 Hypertensive chronic kidney disease with stage 1 through stage 4 chronic kidney disease, or unspecified chronic kidney disease: Secondary | ICD-10-CM | POA: Diagnosis not present

## 2022-03-07 DIAGNOSIS — M67431 Ganglion, right wrist: Secondary | ICD-10-CM | POA: Diagnosis not present

## 2022-03-07 DIAGNOSIS — Z01812 Encounter for preprocedural laboratory examination: Secondary | ICD-10-CM | POA: Insufficient documentation

## 2022-03-07 DIAGNOSIS — N189 Chronic kidney disease, unspecified: Secondary | ICD-10-CM | POA: Diagnosis not present

## 2022-03-07 LAB — BASIC METABOLIC PANEL
Anion gap: 8 (ref 5–15)
BUN: 9 mg/dL (ref 6–20)
CO2: 26 mmol/L (ref 22–32)
Calcium: 9.1 mg/dL (ref 8.9–10.3)
Chloride: 105 mmol/L (ref 98–111)
Creatinine, Ser: 0.91 mg/dL (ref 0.44–1.00)
GFR, Estimated: >60 mL/min (ref >60)
Glucose, Bld: 90 mg/dL (ref 70–99)
Potassium: 3.2 mmol/L — ABNORMAL LOW (ref 3.5–5.1)
Sodium: 139 mmol/L (ref 135–145)

## 2022-03-07 NOTE — Progress Notes (Signed)

## 2022-03-08 NOTE — Progress Notes (Unsigned)
    SUBJECTIVE:   CHIEF COMPLAINT / VKF:[id number]

## 2022-03-08 NOTE — Patient Instructions (Signed)
Good to see you today - Thank you for coming in  Things we discussed today:  You need a Pap smear to prevent cervical cancer.  Please make an appointment.   Please always bring your medication bottles  Come back to see me in ***

## 2022-03-09 ENCOUNTER — Ambulatory Visit (INDEPENDENT_AMBULATORY_CARE_PROVIDER_SITE_OTHER): Payer: BC Managed Care – PPO | Admitting: Family Medicine

## 2022-03-09 ENCOUNTER — Other Ambulatory Visit: Payer: Self-pay

## 2022-03-09 ENCOUNTER — Encounter: Payer: Self-pay | Admitting: Family Medicine

## 2022-03-09 VITALS — BP 158/76 | HR 60 | Wt 222.8 lb

## 2022-03-09 DIAGNOSIS — Z124 Encounter for screening for malignant neoplasm of cervix: Secondary | ICD-10-CM

## 2022-03-09 DIAGNOSIS — I10 Essential (primary) hypertension: Secondary | ICD-10-CM

## 2022-03-09 DIAGNOSIS — E876 Hypokalemia: Secondary | ICD-10-CM

## 2022-03-09 DIAGNOSIS — E785 Hyperlipidemia, unspecified: Secondary | ICD-10-CM

## 2022-03-09 MED ORDER — POTASSIUM CHLORIDE 20 MEQ/15ML (10%) PO SOLN: 20.0000 meq | Freq: Every day | ORAL | 1 refills | Status: DC

## 2022-03-09 NOTE — Assessment & Plan Note (Signed)
Last K was low at 3.2 on 03/07/2022. Pt reports drinking a lot of water; she is currently drinking five 16 oz bottles a day, down from prior 2 gallons a day. Suspect increased water intake might be contributing to low K levels. - Will prescribe liquid K supplementation - Encouraged pt to continue cutting down on water, with potential to substitute some water for electrolyte beverages. - Will arrange for repeat labs in a few weeks to recheck K level.

## 2022-03-09 NOTE — Assessment & Plan Note (Addendum)
BP today high at 158/76. Pt reports some nervousness about pap today and surgery tomorrow; this could be increasing BP. She reports systolics typically in the 130s at other visits. She had a recent episode of dizziness, since resolved and not concerning at this time given isolated instance. - Continue current BP medications - Instructed pt to start regularly checking home BPs and reach out if regularly > 140/90.

## 2022-03-09 NOTE — Progress Notes (Addendum)
    SUBJECTIVE:   CHIEF COMPLAINT / HPI:   [name] is a 56 y.o. female who presents today for physical. Concerns today include recent episode of dizziness.  Dizziness / Hypokalemia She reports a recent episode of dizziness occurring over 2-3 days and improved after eating a banana. Some associated "spinning." No presyncope, CP, palpitations. Drinks lots of water; was drinking 2 gallons previously, currently at five 16 oz bottle a day. K low at 3.2 (03/07/2022).  Hypertension Bp high today in clinic at 158/76; 177/76 on repeat. She reports her BP is normally not that high at prior visits with systolics usually in the 672C and relates feeling anxious about upcoming Pap and wrist surgery.  Recent mammogram done 03/07/2022 was normal. She is getting pap today. 2 brothers died with CHF; youngest in 09-05-2021; mom died of MI at 1 yo. No longer eats pork.  PERTINENT  PMH / PSH: HTN, FHx of cardiac disease  OBJECTIVE:   BP (!) 158/76   Pulse 60   Wt 222 lb 12.8 oz (101.1 kg)   SpO2 100%   BMI 33.88 kg/m   General: Pt is well appearing and seated comfortably in chair. NAD. Cardiovascular: RRR, no murmurs, rubs, gallops. Pulmonary: Normal work of breathing. Lungs clear to auscultation bilaterally. Pelvic: Vulva: Normal appearing vulva with no masses, tenderness or lesions Vagina: Normal appearing vagina with normal color, no lesions, with scant discharge present Cervix: No lesions, scant discharge present. Pap smear performed today in clinic.      Chaperone present for pelvic exam. Neuro/Psych: A&Ox3. Normal affect.  ASSESSMENT/PLAN:   HYPERTENSION, BENIGN SYSTEMIC BP today high at 158/76. Pt reports some nervousness about pap today and surgery tomorrow; this could be increasing BP. She reports systolics typically in the 130s at other visits. She had a recent episode of dizziness, since resolved and not concerning at this time given isolated instance. - Continue current  BP medications - Instructed pt to start regularly checking home BPs and reach out if regularly > 140/90.  Hypokalemia Last K was low at 3.2 on 03/07/2022. Pt reports drinking a lot of water; she is currently drinking five 16 oz bottles a day, down from prior 2 gallons a day. Suspect increased water intake might be contributing to low K levels. - Will prescribe liquid K supplementation - Encouraged pt to continue cutting down on water, with potential to substitute some water for electrolyte beverages. - Will arrange for repeat labs in a few weeks to recheck K level.   Patient Instructions  Good to see you today - Thank you for coming in  Things we discussed today:  Take Potassium chloride 15 ml twice today and then once daily Cut back on the water intake to 3 bottles per day  Come back for blood tests to check potassium and cholesterol in 2 weeks   Your goal blood pressure is less than 140/90.  Check your blood pressure several times a week.  If regularly higher than this please let me know - either with MyChart or leaving a phone message. Next visit please bring in your blood pressure cuff.     Please always bring your medication bottles  Come back to see me in 6 months    Carmelina Dane, [place]   I was present during key history taking and physical exam and any procedures.  I agree with the note above Samella Parr MD

## 2022-03-10 ENCOUNTER — Encounter (HOSPITAL_BASED_OUTPATIENT_CLINIC_OR_DEPARTMENT_OTHER): Payer: Self-pay | Admitting: Orthopedic Surgery

## 2022-03-10 ENCOUNTER — Ambulatory Visit (HOSPITAL_BASED_OUTPATIENT_CLINIC_OR_DEPARTMENT_OTHER): Payer: BC Managed Care – PPO | Admitting: Anesthesiology

## 2022-03-10 ENCOUNTER — Other Ambulatory Visit: Payer: Self-pay

## 2022-03-10 ENCOUNTER — Encounter (HOSPITAL_BASED_OUTPATIENT_CLINIC_OR_DEPARTMENT_OTHER)
Admission: RE | Disposition: A | Discharge status: Home / Self Care | Payer: Self-pay | Source: Home / Self Care | Attending: Orthopedic Surgery

## 2022-03-10 ENCOUNTER — Ambulatory Visit (HOSPITAL_BASED_OUTPATIENT_CLINIC_OR_DEPARTMENT_OTHER)
Admission: RE | Admit: 2022-03-10 | Discharge: 2022-03-10 | Disposition: A | Discharge status: Home / Self Care | Payer: BC Managed Care – PPO | Attending: Orthopedic Surgery | Admitting: Orthopedic Surgery

## 2022-03-10 DIAGNOSIS — M67431 Ganglion, right wrist: Secondary | ICD-10-CM | POA: Diagnosis not present

## 2022-03-10 DIAGNOSIS — I129 Hypertensive chronic kidney disease with stage 1 through stage 4 chronic kidney disease, or unspecified chronic kidney disease: Secondary | ICD-10-CM | POA: Insufficient documentation

## 2022-03-10 DIAGNOSIS — N189 Chronic kidney disease, unspecified: Secondary | ICD-10-CM | POA: Insufficient documentation

## 2022-03-10 DIAGNOSIS — Z01818 Encounter for other preprocedural examination: Secondary | ICD-10-CM

## 2022-03-10 HISTORY — PX: GANGLION CYST EXCISION: SHX1691

## 2022-03-10 SURGERY — EXCISION, GANGLION CYST, WRIST
Anesthesia: General | Site: Wrist | Laterality: Right

## 2022-03-10 MED ORDER — PROPOFOL 500 MG/50ML IV EMUL
INTRAVENOUS | Status: DC | PRN
  Administered 2022-03-10: 200 ug/kg/min via INTRAVENOUS

## 2022-03-10 MED ORDER — DEXAMETHASONE SODIUM PHOSPHATE 10 MG/ML IJ SOLN
INTRAMUSCULAR | Status: AC
  Filled 2022-03-10: qty 1

## 2022-03-10 MED ORDER — 0.9 % SODIUM CHLORIDE (POUR BTL) OPTIME
TOPICAL | Status: DC | PRN
  Administered 2022-03-10: 100 mL

## 2022-03-10 MED ORDER — PHENYLEPHRINE 80 MCG/ML (10ML) SYRINGE FOR IV PUSH (FOR BLOOD PRESSURE SUPPORT)
PREFILLED_SYRINGE | INTRAVENOUS | Status: AC
  Filled 2022-03-10: qty 10

## 2022-03-10 MED ORDER — ONDANSETRON HCL 4 MG/2ML IJ SOLN
INTRAMUSCULAR | Status: AC
  Filled 2022-03-10: qty 2

## 2022-03-10 MED ORDER — FENTANYL CITRATE (PF) 100 MCG/2ML IJ SOLN
INTRAMUSCULAR | Status: AC
  Filled 2022-03-10: qty 2

## 2022-03-10 MED ORDER — KETOROLAC TROMETHAMINE 30 MG/ML IJ SOLN
INTRAMUSCULAR | Status: AC
  Filled 2022-03-10: qty 1

## 2022-03-10 MED ORDER — LIDOCAINE 2% (20 MG/ML) 5 ML SYRINGE
INTRAMUSCULAR | Status: DC | PRN
  Administered 2022-03-10: 100 mg via INTRAVENOUS

## 2022-03-10 MED ORDER — PROPOFOL 10 MG/ML IV BOLUS
INTRAVENOUS | Status: DC | PRN
  Administered 2022-03-10: 200 mg via INTRAVENOUS

## 2022-03-10 MED ORDER — ACETAMINOPHEN 500 MG PO TABS
1000.0000 mg | ORAL_TABLET | Freq: Once | ORAL | Status: AC
  Administered 2022-03-10: 1000 mg via ORAL

## 2022-03-10 MED ORDER — CEFAZOLIN SODIUM-DEXTROSE 2-4 GM/100ML-% IV SOLN
INTRAVENOUS | Status: AC
  Filled 2022-03-10: qty 100

## 2022-03-10 MED ORDER — SUCCINYLCHOLINE CHLORIDE 200 MG/10ML IV SOSY
PREFILLED_SYRINGE | INTRAVENOUS | Status: AC
  Filled 2022-03-10: qty 10

## 2022-03-10 MED ORDER — ONDANSETRON HCL 4 MG/2ML IJ SOLN
INTRAMUSCULAR | Status: DC | PRN
  Administered 2022-03-10: 4 mg via INTRAVENOUS

## 2022-03-10 MED ORDER — SCOPOLAMINE 1 MG/3DAYS TD PT72
1.0000 | MEDICATED_PATCH | TRANSDERMAL | Status: DC
  Administered 2022-03-10: 1.5 mg via TRANSDERMAL

## 2022-03-10 MED ORDER — BUPIVACAINE HCL (PF) 0.25 % IJ SOLN
INTRAMUSCULAR | Status: DC | PRN
  Administered 2022-03-10: 9 mL

## 2022-03-10 MED ORDER — ACETAMINOPHEN 500 MG PO TABS
ORAL_TABLET | ORAL | Status: AC
  Filled 2022-03-10: qty 2

## 2022-03-10 MED ORDER — ACETAMINOPHEN 160 MG/5ML PO SOLN: 1000.0000 mg | Freq: Once | ORAL | Status: DC | PRN

## 2022-03-10 MED ORDER — OXYCODONE HCL 5 MG PO TABS
ORAL_TABLET | ORAL | Status: AC
  Filled 2022-03-10: qty 1

## 2022-03-10 MED ORDER — MIDAZOLAM HCL 2 MG/2ML IJ SOLN
INTRAMUSCULAR | Status: AC
  Filled 2022-03-10: qty 2

## 2022-03-10 MED ORDER — OXYCODONE HCL 5 MG PO TABS
5.0000 mg | ORAL_TABLET | Freq: Once | ORAL | Status: AC | PRN
  Administered 2022-03-10: 5 mg via ORAL

## 2022-03-10 MED ORDER — ACETAMINOPHEN 500 MG PO TABS: 1000.0000 mg | ORAL_TABLET | Freq: Once | ORAL | Status: DC | PRN

## 2022-03-10 MED ORDER — ACETAMINOPHEN 10 MG/ML IV SOLN: 1000.0000 mg | Freq: Once | INTRAVENOUS | Status: DC | PRN

## 2022-03-10 MED ORDER — DEXAMETHASONE SODIUM PHOSPHATE 10 MG/ML IJ SOLN
INTRAMUSCULAR | Status: DC | PRN
  Administered 2022-03-10: 5 mg via INTRAVENOUS

## 2022-03-10 MED ORDER — OXYCODONE HCL 5 MG/5ML PO SOLN: 5.0000 mg | Freq: Once | ORAL | Status: AC | PRN

## 2022-03-10 MED ORDER — SCOPOLAMINE 1 MG/3DAYS TD PT72
MEDICATED_PATCH | TRANSDERMAL | Status: AC
  Filled 2022-03-10: qty 1

## 2022-03-10 MED ORDER — AMISULPRIDE (ANTIEMETIC) 5 MG/2ML IV SOLN: 10.0000 mg | Freq: Once | INTRAVENOUS | Status: DC

## 2022-03-10 MED ORDER — MIDAZOLAM HCL 5 MG/5ML IJ SOLN
INTRAMUSCULAR | Status: DC | PRN
  Administered 2022-03-10: 2 mg via INTRAVENOUS

## 2022-03-10 MED ORDER — CEFAZOLIN SODIUM-DEXTROSE 2-4 GM/100ML-% IV SOLN
2.0000 g | INTRAVENOUS | Status: AC
  Administered 2022-03-10: 2 g via INTRAVENOUS

## 2022-03-10 MED ORDER — KETOROLAC TROMETHAMINE 30 MG/ML IJ SOLN
INTRAMUSCULAR | Status: DC | PRN
  Administered 2022-03-10: 30 mg via INTRAVENOUS

## 2022-03-10 MED ORDER — EPHEDRINE 5 MG/ML INJ
INTRAVENOUS | Status: AC
  Filled 2022-03-10: qty 5

## 2022-03-10 MED ORDER — HYDROCODONE-ACETAMINOPHEN 5-325 MG PO TABS: ORAL_TABLET | ORAL | 0 refills | Status: DC

## 2022-03-10 MED ORDER — FENTANYL CITRATE (PF) 100 MCG/2ML IJ SOLN
INTRAMUSCULAR | Status: DC | PRN
  Administered 2022-03-10: 50 ug via INTRAVENOUS

## 2022-03-10 MED ORDER — LACTATED RINGERS IV SOLN: INTRAVENOUS | Status: DC

## 2022-03-10 MED ORDER — FENTANYL CITRATE (PF) 100 MCG/2ML IJ SOLN
25.0000 ug | INTRAMUSCULAR | Status: DC | PRN
  Administered 2022-03-10 (×2): 25 ug via INTRAVENOUS

## 2022-03-10 SURGICAL SUPPLY — 44 items
APL PRP STRL LF DISP 70% ISPRP (MISCELLANEOUS) ×1
APL SKNCLS STERI-STRIP NONHPOA (GAUZE/BANDAGES/DRESSINGS)
BENZOIN TINCTURE PRP APPL 2/3 (GAUZE/BANDAGES/DRESSINGS) IMPLANT
BLADE MINI RND TIP GREEN BEAV (BLADE) IMPLANT
BLADE SURG 15 STRL LF DISP TIS (BLADE) ×2 IMPLANT
BLADE SURG 15 STRL SS (BLADE) ×2
BNDG CMPR 9X4 STRL LF SNTH (GAUZE/BANDAGES/DRESSINGS)
BNDG ELASTIC 2X5.8 VLCR STR LF (GAUZE/BANDAGES/DRESSINGS) IMPLANT
BNDG ELASTIC 3X5.8 VLCR STR LF (GAUZE/BANDAGES/DRESSINGS) ×1 IMPLANT
BNDG ESMARK 4X9 LF (GAUZE/BANDAGES/DRESSINGS) IMPLANT
BNDG GAUZE DERMACEA FLUFF 4 (GAUZE/BANDAGES/DRESSINGS) ×1 IMPLANT
BNDG GZE DERMACEA 4 6PLY (GAUZE/BANDAGES/DRESSINGS) ×1
CHLORAPREP W/TINT 26 (MISCELLANEOUS) ×1 IMPLANT
CORD BIPOLAR FORCEPS 12FT (ELECTRODE) ×1 IMPLANT
COVER BACK TABLE 60X90IN (DRAPES) ×1 IMPLANT
COVER MAYO STAND STRL (DRAPES) ×1 IMPLANT
CUFF TOURN SGL QUICK 18X4 (TOURNIQUET CUFF) ×1 IMPLANT
DRAPE EXTREMITY T 121X128X90 (DISPOSABLE) ×1 IMPLANT
DRAPE SURG 17X23 STRL (DRAPES) ×1 IMPLANT
GAUZE PAD ABD 8X10 STRL (GAUZE/BANDAGES/DRESSINGS) IMPLANT
GAUZE SPONGE 4X4 12PLY STRL (GAUZE/BANDAGES/DRESSINGS) ×1 IMPLANT
GAUZE XEROFORM 1X8 LF (GAUZE/BANDAGES/DRESSINGS) ×1 IMPLANT
GLOVE BIO SURGEON STRL SZ7.5 (GLOVE) ×1 IMPLANT
GLOVE BIOGEL PI IND STRL 8 (GLOVE) ×1 IMPLANT
GOWN STRL REUS W/ TWL LRG LVL3 (GOWN DISPOSABLE) ×1 IMPLANT
GOWN STRL REUS W/TWL LRG LVL3 (GOWN DISPOSABLE) ×1
GOWN STRL REUS W/TWL XL LVL3 (GOWN DISPOSABLE) ×1 IMPLANT
NDL HYPO 25X1 1.5 SAFETY (NEEDLE) IMPLANT
NEEDLE HYPO 25X1 1.5 SAFETY (NEEDLE) IMPLANT
NS IRRIG 1000ML POUR BTL (IV SOLUTION) ×1 IMPLANT
PACK BASIN DAY SURGERY FS (CUSTOM PROCEDURE TRAY) ×1 IMPLANT
PAD CAST 3X4 CTTN HI CHSV (CAST SUPPLIES) IMPLANT
PADDING CAST ABS COTTON 4X4 ST (CAST SUPPLIES) ×1 IMPLANT
PADDING CAST COTTON 3X4 STRL (CAST SUPPLIES)
SPLINT PLASTER CAST XFAST 3X15 (CAST SUPPLIES) IMPLANT
STOCKINETTE 4X48 STRL (DRAPES) ×1 IMPLANT
STRIP CLOSURE SKIN 1/2X4 (GAUZE/BANDAGES/DRESSINGS) IMPLANT
SUT MNCRL AB 4-0 PS2 18 (SUTURE) IMPLANT
SUT MON AB 5-0 PS2 18 (SUTURE) IMPLANT
SUT VIC AB 4-0 P2 18 (SUTURE) IMPLANT
SYR BULB EAR ULCER 3OZ GRN STR (SYRINGE) ×1 IMPLANT
SYR CONTROL 10ML LL (SYRINGE) IMPLANT
TOWEL GREEN STERILE FF (TOWEL DISPOSABLE) ×2 IMPLANT
UNDERPAD 30X36 HEAVY ABSORB (UNDERPADS AND DIAPERS) ×1 IMPLANT

## 2022-03-10 NOTE — Transfer of Care (Signed)
Immediate Anesthesia Transfer of Care Note  Patient: [name]  Procedure(s) Performed: RIGHT WRIST EXCISION RECURRENT DORSAL GANGLION (Right: Wrist)  Patient Location: PACU  Anesthesia Type:General  Level of Consciousness: sedated  Airway & Oxygen Therapy: Patient Spontanous Breathing and Patient connected to face mask oxygen  Post-op Assessment: Report given to RN and Post -op Vital signs reviewed and stable  Post vital signs: Reviewed and stable  Last Vitals:  Vitals Value Taken Time  BP 133/78 03/10/22 1419  Temp    Pulse 75 03/10/22 1423  Resp 15 03/10/22 1423  SpO2 97 % 03/10/22 1423  Vitals shown include unvalidated device data.  Last Pain:  Vitals:   03/10/22 1114  TempSrc: Oral  PainSc: 0-No pain      Patients Stated Pain Goal: 9 (35/67/01 4103)  Complications: No notable events documented.

## 2022-03-10 NOTE — Anesthesia Postprocedure Evaluation (Signed)
Anesthesia Post Note  Patient: Nikiya Starn  Procedure(s) Performed: RIGHT WRIST EXCISION RECURRENT DORSAL GANGLION (Right: Wrist)     Patient location during evaluation: PACU Anesthesia Type: General Level of consciousness: awake and alert Pain management: pain level controlled Vital Signs Assessment: post-procedure vital signs reviewed and stable Respiratory status: spontaneous breathing, nonlabored ventilation and respiratory function stable Cardiovascular status: blood pressure returned to baseline Postop Assessment: no apparent nausea or vomiting Anesthetic complications: no   No notable events documented.  Last Vitals:  Vitals:   03/10/22 1430 03/10/22 1445  BP: (!) 149/77 132/64  Pulse: 74 (!) 55  Resp: 19 15  Temp:  36.4 C  SpO2: 99% 92%    Last Pain:  Vitals:   03/10/22 1500  TempSrc:   PainSc: [place]

## 2022-03-10 NOTE — Anesthesia Preprocedure Evaluation (Addendum)
Anesthesia Evaluation  Patient identified by MRN, date of birth, ID band Patient awake    Reviewed: Allergy & Precautions, NPO status , Patient's Chart, lab work & pertinent test results  History of Anesthesia Complications (+) PONV and history of anesthetic complications  Airway Mallampati: III  TM Distance: >3 FB Neck ROM: Full    Dental  (+) Teeth Intact, Dental Advisory Given,    Pulmonary neg pulmonary ROS, neg shortness of breath, neg sleep apnea, neg COPD, neg recent URI   breath sounds clear to auscultation       Cardiovascular hypertension, Pt. on medications (-) angina (-) Past MI and (-) CHF  Rhythm:Regular     Neuro/Psych  Headaches  negative psych ROS   GI/Hepatic Neg liver ROS,GERD  ,,  Endo/Other  negative endocrine ROS    Renal/GU Renal diseaseLab Results      Component                Value               Date                      CREATININE               0.91                03/07/2022                Musculoskeletal  (+) Arthritis ,    Abdominal   Peds  Hematology negative hematology ROS (+) Lab Results      Component                Value               Date                      WBC                      5.5                 08/22/2020                HGB                      12.7                08/22/2020                HCT                      37.0                08/22/2020                MCV                      79.1 (L)            08/22/2020                PLT                      281                 08/22/2020              Anesthesia Other Findings  Reproductive/Obstetrics                             Anesthesia Physical Anesthesia Plan  ASA: 2  Anesthesia Plan: General   Post-op Pain Management: Toradol IV (intra-op)* and Tylenol PO (pre-op)*   Induction: Intravenous  PONV Risk Score and Plan: 4 or greater and Ondansetron, Dexamethasone, Scopolamine patch -  Pre-op, Propofol infusion and TIVA  Airway Management Planned: LMA  Additional Equipment: None  Intra-op Plan:   Post-operative Plan: Extubation in OR  Informed Consent: I have reviewed the patients History and Physical, chart, labs and discussed the procedure including the risks, benefits and alternatives for the proposed anesthesia with the patient or authorized representative who has indicated his/her understanding and acceptance.     Dental advisory given  Plan Discussed with: CRNA  Anesthesia Plan Comments:         Anesthesia Quick Evaluation

## 2022-03-10 NOTE — Op Note (Signed)
NAME: [name] MEDICAL RECORD NO: [id number] DATE OF BIRTH: 07/25/1965 FACILITY: [facility] LOCATION: [facility] PHYSICIAN: Tennis Must, MD   OPERATIVE REPORT   DATE OF PROCEDURE: 03/10/22    PREOPERATIVE DIAGNOSIS: Right wrist recurrent dorsal ganglion cyst   POSTOPERATIVE DIAGNOSIS: Right wrist recurrent dorsal ganglion cyst   PROCEDURE: Right wrist excision of recurrent dorsal ganglion cyst   SURGEON:  Leanora Cover, M.D.   ASSISTANT: Daryll Brod, MD   ANESTHESIA:  General   INTRAVENOUS FLUIDS:  Per anesthesia flow sheet.   ESTIMATED BLOOD LOSS:  Minimal.   COMPLICATIONS:  None.   SPECIMENS: Right wrist ganglion to pathology   TOURNIQUET TIME:   Right arm: Approximate 25 minutes at 250 mmHg   DISPOSITION:  Stable to PACU.   INDICATIONS: 56 year old female with recurrent right wrist dorsal ganglion cyst.  It is bothersome and painful to her.  She wishes to have it removed.  Risks, benefits and alternatives of surgery were discussed including the risks of blood loss, infection, damage to nerves, vessels, tendons, ligaments, bone for surgery, need for additional surgery, complications with wound healing, continued pain, stiffness, , recurrence.  She voiced understanding of these risks and elected to proceed.  OPERATIVE COURSE:  After being identified preoperatively by myself,  the patient and I agreed on the procedure and site of the procedure.  The surgical site was marked.  Surgical consent had been signed. Preoperative IV antibiotic prophylaxis was given. She was transferred to the operating room and placed on the operating table in supine position with the Right upper extremity on an arm board.  General anesthesia was induced by the anesthesiologist.  Right upper extremity was prepped and draped in normal sterile orthopedic fashion.  A surgical pause was performed between the surgeons, anesthesia, and operating room staff and all were in agreement  as to the patient, procedure, and site of procedure.  Tourniquet at the proximal aspect of the extremity was inflated to 250 mmHg after exsanguination of the arm with an Esmarch bandage.  Transverse incision was made on the dorsum of the wrist over the recurrent ganglion.  This was the same location as previous excision.  This was carried in subcutaneous tissues by spreading technique.  Bipolar electrocautery is used to obtain hemostasis.  The cyst was easily identified.  It was carefully freed up of surrounding soft tissue attachments.  There was a stalk coming from the dorsum of the wrist joint.  The cyst was removed and sent to pathology for examination.  The base of the cyst around the stalk was debrided with the rongeurs.  A 4-0 Vicryl suture was used to repair the rent in the capsule.  This was done in a figure-of-eight fashion.  The wound was copiously irrigated with sterile saline.  Inverted interrupted 4-0 Vicryl sutures were placed in subcutaneous tissues and the skin was closed with a running subcuticular 4-0 Monocryl suture.  This was augmented with benzoin and Steri-Strips.  It was injected with quarter percent plain Marcaine to aid in postoperative analgesia.  The wound was dressed with sterile 4 x 4's and an ABD used as a splint.  This was wrapped with Kerlix and Ace bandage.  The tourniquet was deflated at approximately 25 minutes.  Fingertips were pink with brisk capillary refill after deflation of tourniquet.  The operative  drapes were broken down.  The patient was awoken from anesthesia safely.  She was transferred back to the stretcher and taken to PACU in  stable condition.  I will see her back in the office in 1 week for postoperative followup.  I will give her a prescription for Norco 5/325 1-2 tabs PO q6 hours prn pain, dispense # 15.   Leanora Cover, MD Electronically signed, 03/10/22

## 2022-03-10 NOTE — H&P (Signed)
[name] is an 56 y.o. female.   Chief Complaint: recurrent ganglion cyst HPI: 56 yo female with right wrist recurrent ganglion cyst.  It is bothersome to her.  She wishes to have it removed.  Allergies:  Allergies  Allergen Reactions   Tetracycline Hcl Shortness Of Breath, Swelling and Other (See Comments)    Large purple bruise   Angiotensin Receptor Blockers Swelling    Had episode of angioedema on Losartan    Nickel Dermatitis and Itching   Septra [Bactrim] Itching, Swelling and Other (See Comments)    Purple bruise   Sulfamethoxazole-Trimethoprim Itching, Swelling and Other (See Comments)    Purple bruise    Past Medical History:  Diagnosis Date   Allergy    Arthritis    Lt ankle   Chronic kidney disease    right renal cyst    GERD (gastroesophageal reflux disease)    hx of no problems at present    Headache(784.0)    occasional headache    Hypertension    Influenza 04/20/2012   PONV (postoperative nausea and vomiting)    N/V and trouble urinating after surgery     Past Surgical History:  Procedure Laterality Date   BREAST EXCISIONAL BIOPSY Left    BREAST LUMPECTOMY     BREAST SURGERY  2010   lumpectomy Lt breast   CARPAL TUNNEL RELEASE Right 08/06/2015   Procedure: RIGHT CARPAL TUNNEL RELEASE;  Surgeon: Leanora Cover, MD;  Location: [facility];  Service: Orthopedics;  Laterality: Right;   DORSAL COMPARTMENT RELEASE Right 08/06/2015   Procedure: RIGHT FIRST RELEASE DORSAL COMPARTMENT (DEQUERVAIN);  Surgeon: Leanora Cover, MD;  Location: [place];  Service: Orthopedics;  Laterality: Right;   ENDOMETRIAL ABLATION  10/18/2010   at [place] ankle surgeries x 3    GANGLION CYST EXCISION Right 07/02/2021   Procedure: RIGHT WRIST EXCISION DORSAL GANGLION;  Surgeon: Leanora Cover, MD;  Location: [place];  Service: Orthopedics;  Laterality: Right;   JOINT REPLACEMENT     left ankle  8/12    LAPAROSCOPIC NEPHRECTOMY  03/09/2011   Procedure: LAPAROSCOPIC NEPHRECTOMY;  Surgeon: Franchot Gallo;  Location: [facility];  Service: Urology;  Laterality: Right;  Laparoscopic Assisted Removal Of Renal Cyst    OTHER SURGICAL HISTORY     2 hand surgeries on left and shoulder surgery on right shoulder    TOTAL ANKLE ARTHROPLASTY Left 07/05/2012   Procedure: Removal of total ankle implants,I & D with insertion of  new poly spacer;  Surgeon: Wylene Simmer, MD;  Location: [place];  Service: Orthopedics;  Laterality: Left;   TUBAL LIGATION  1988    Family History: Family History  Problem Relation Age of Onset   Diabetes Mother    Heart disease Mother    Hypertension Father    Diabetes Sister    Kidney disease Sister    Hypertension Brother    Kidney disease Brother    Arthritis Brother    Cancer Maternal Grandmother    Diabetes Sister    Hypertension Sister    Hypertension Sister    Hypertension Sister    Hypertension Brother    Mental illness Brother    Breast cancer Niece    Colon cancer Neg Hx     Social History:   reports that she has never smoked. She has never used smokeless tobacco. She reports that she does not drink alcohol and does not use  drugs.  Medications: Medications Prior to Admission  Medication Sig Dispense Refill   ASPIRIN 81 PO Take by mouth.     indapamide (LOZOL) 1.25 MG tablet TAKE 1 TABLET BY MOUTH EVERY DAY 90 tablet 2   potassium chloride 20 MEQ/15ML (10%) SOLN Take 15 mLs (20 mEq total) by mouth daily. 473 mL 1   rosuvastatin (CRESTOR) 5 MG tablet Take 1 tablet (5 mg total) by mouth daily. 90 tablet 3   spironolactone (ALDACTONE) 25 MG tablet TAKE 1 TABLET BY MOUTH EVERY DAY 90 tablet 1    No results found for this or any previous visit (from the past 48 hour(s)).  No results found.    Blood pressure (!) 157/68, pulse (!) 52, temperature 98.9 F (37.2 C), temperature source Oral, resp. rate 16, height '5\' 8"'$  (1.727 m), weight 100.1 kg, SpO2  99 %.  General appearance: alert, cooperative, and appears stated age Head: Normocephalic, without obvious abnormality, atraumatic Neck: supple, symmetrical, trachea midline Extremities: Intact sensation and capillary refill all digits.  +epl/fpl/io.  No wounds.  Pulses: 2+ and symmetric Skin: Skin color, texture, turgor normal. No rashes or lesions Neurologic: Grossly normal Incision/Wound: none  Assessment/Plan Right wrist recurrent ganglion cyst.  Non operative and operative treatment options have been discussed with the patient and patient wishes to proceed with operative treatment. Risks, benefits, and alternatives of surgery have been discussed and the patient agrees with the plan of care.   Leanora Cover 03/10/2022, 12:27 PM

## 2022-03-10 NOTE — Discharge Instructions (Addendum)
[facility] Instructions Hand Surgery  Wound Care: Keep your hand elevated above the level of your heart.  Do not allow it to dangle by your side.  Keep the dressing dry and do not remove it unless your doctor advises you to do so.  He will usually change it at the time of your post-op visit.  Moving your fingers is advised to stimulate circulation but will depend on the site of your surgery.  If you have a splint applied, your doctor will advise you regarding movement.  Activity: Do not drive or operate machinery today.  Rest today and then you may return to your normal activity and work as indicated by your physician.  Diet:  Drink liquids today or eat a light diet.  You may resume a regular diet tomorrow.    General expectations: Pain for two to three days. Fingers may become slightly swollen.  Call your doctor if any of the following occur: Severe pain not relieved by pain medication. Elevated temperature. Dressing soaked with blood. Inability to move fingers. White or bluish color to fingers.  May take Tylenol after 6pm, if needed.  May take Tylenol after 7:40pm, if needed.   Post Anesthesia Home Care Instructions  Activity: Get plenty of rest for the remainder of the day. A responsible individual must stay with you for 24 hours following the procedure.  For the next 24 hours, DO NOT: -Drive a car -Paediatric nurse -Drink alcoholic beverages -Take any medication unless instructed by your physician -Make any legal decisions or sign important papers.  Meals: Start with liquid foods such as gelatin or soup. Progress to regular foods as tolerated. Avoid greasy, spicy, heavy foods. If nausea and/or vomiting occur, drink only clear liquids until the nausea and/or vomiting subsides. Call your physician if vomiting continues.  Special Instructions/Symptoms: Your throat may feel dry or sore from the anesthesia or the breathing tube placed in your throat during surgery. If this  causes discomfort, gargle with warm salt water. The discomfort should disappear within 24 hours.  If you had a scopolamine patch placed behind your ear for the management of post- operative nausea and/or vomiting:  1. The medication in the patch is effective for 72 hours, after which it should be removed.  Wrap patch in a tissue and discard in the trash. Wash hands thoroughly with soap and water. 2. You may remove the patch earlier than 72 hours if you experience unpleasant side effects which may include dry mouth, dizziness or visual disturbances. 3. Avoid touching the patch. Wash your hands with soap and water after contact with the patch.

## 2022-03-10 NOTE — Op Note (Signed)
I assisted Surgeon(s) and Role:    * Leanora Cover, MD - Primary    * Daryll Brod, MD - Assisting on the Procedure(s): RIGHT WRIST EXCISION RECURRENT DORSAL GANGLION on 03/10/2022.  I provided assistance on this case as follows: setup, approach, retraction, identification, isolation and removal of the cyst, closure of the incision and application  of the dressing. Electronically signed by: Daryll Brod, MD Date: 03/10/2022 Time: 2:11 PM

## 2022-03-10 NOTE — Anesthesia Procedure Notes (Addendum)
Procedure Name: LMA Insertion Date/Time: 03/10/2022 1:37 AM  Performed by: Willa Frater, CRNAPre-anesthesia Checklist: Patient identified, Emergency Drugs available, Suction available and Patient being monitored Patient Re-evaluated:Patient Re-evaluated prior to induction Oxygen Delivery Method: Circle System Utilized Preoxygenation: Pre-oxygenation with 100% oxygen Induction Type: IV induction Ventilation: Mask ventilation without difficulty LMA: LMA inserted LMA Size: 4.0 Number of attempts: 1 Airway Equipment and Method: bite block Placement Confirmation: positive ETCO2 Tube secured with: Tape Dental Injury: Teeth and Oropharynx as per pre-operative assessment

## 2022-03-11 ENCOUNTER — Encounter (HOSPITAL_BASED_OUTPATIENT_CLINIC_OR_DEPARTMENT_OTHER): Payer: Self-pay | Admitting: Orthopedic Surgery

## 2022-03-11 ENCOUNTER — Other Ambulatory Visit (HOSPITAL_COMMUNITY)
Admission: RE | Admit: 2022-03-11 | Discharge: 2022-03-11 | Disposition: A | Discharge status: Home / Self Care | Payer: BC Managed Care – PPO | Source: Ambulatory Visit | Attending: Family Medicine | Admitting: Family Medicine

## 2022-03-11 DIAGNOSIS — Z124 Encounter for screening for malignant neoplasm of cervix: Secondary | ICD-10-CM | POA: Insufficient documentation

## 2022-03-11 LAB — SURGICAL PATHOLOGY

## 2022-03-11 NOTE — Addendum Note (Signed)
Addended by: Junious Dresser on: 03/11/2022 09:39 AM   Modules accepted: Orders

## 2022-03-14 LAB — CYTOLOGY - PAP
Comment: NEGATIVE
Diagnosis: NEGATIVE
High risk HPV: NEGATIVE

## 2022-03-28 ENCOUNTER — Telehealth: Payer: Self-pay

## 2022-03-28 NOTE — Patient Outreach (Signed)
  Care Coordination   03/28/2022 Name: [name] MRN: [id number] DOB: 09-23-1965   Care Coordination Outreach Attempts:  A second unsuccessful outreach was attempted today to offer the patient with information about available care coordination services as a benefit of their health plan.     Follow Up Plan:  Additional outreach attempts will be made to offer the patient care coordination information and services.   Encounter Outcome:  No Answer   Care Coordination Interventions:  No, not indicated    Jone Baseman, RN, MSN [facility] Care Management Coordinator Direct Line [phone]

## 2022-03-29 ENCOUNTER — Other Ambulatory Visit: Payer: BC Managed Care – PPO

## 2022-03-29 DIAGNOSIS — E785 Hyperlipidemia, unspecified: Secondary | ICD-10-CM

## 2022-03-29 DIAGNOSIS — I10 Essential (primary) hypertension: Secondary | ICD-10-CM

## 2022-03-30 LAB — BASIC METABOLIC PANEL
BUN/Creatinine Ratio: 12 (ref 9–23)
BUN: 11 mg/dL (ref 6–24)
CO2: 24 mmol/L (ref 20–29)
Calcium: 9.6 mg/dL (ref 8.7–10.2)
Chloride: 101 mmol/L (ref 96–106)
Creatinine, Ser: 0.9 mg/dL (ref 0.57–1.00)
Glucose: 94 mg/dL (ref 70–99)
Potassium: 4 mmol/L (ref 3.5–5.2)
Sodium: 138 mmol/L (ref 134–144)
eGFR: 75 mL/min/{1.73_m2} (ref >59)

## 2022-03-30 LAB — LIPID PANEL
Chol/HDL Ratio: 3.5 ratio (ref 0.0–4.4)
Cholesterol, Total: 130 mg/dL (ref 100–199)
HDL: 37 mg/dL — ABNORMAL LOW (ref >39)
LDL Chol Calc (NIH): 71 mg/dL (ref 0–99)
Triglycerides: 121 mg/dL (ref 0–149)
VLDL Cholesterol Cal: 22 mg/dL (ref 5–40)

## 2022-03-31 ENCOUNTER — Other Ambulatory Visit: Payer: Self-pay | Admitting: Family Medicine

## 2022-04-14 ENCOUNTER — Telehealth: Payer: Self-pay

## 2022-04-14 NOTE — Patient Outreach (Signed)
  Care Coordination   Initial Visit Note   04/14/2022 Name: [name] MRN: [id number] DOB: 1965-05-06  [name] is a 56 y.o. year old female who sees Chambliss, Jeb Levering, MD for primary care. I spoke with  Marisa Severin by phone today.  What matters to the patients health and wellness today?  none    Goals Addressed             This Visit's Progress    COMPLETED: Care Coordination Activities-No follow up required       Care Coordination Interventions: Advised patient to schedule annual visit and flu vaccine.  Discussed [facility] services and support.  Patient decline          SDOH assessments and interventions completed:  Yes  SDOH Interventions Today    Flowsheet Row Most Recent Value  SDOH Interventions   Transportation Interventions Intervention Not Indicated  Utilities Interventions Intervention Not Indicated        Care Coordination Interventions:  Yes, provided   Follow up plan: No further intervention required.   Encounter Outcome:  Pt. Visit Completed   Jone Baseman, RN, MSN [facility] Care Management Coordinator Direct Line [phone]

## 2022-04-14 NOTE — Patient Instructions (Signed)
Visit Information  Thank you for taking time to visit with me today. Please don't hesitate to contact me if I can be of assistance to you.   Following are the goals we discussed today:   Goals Addressed             This Visit's Progress    COMPLETED: Care Coordination Activities-No follow up required       Care Coordination Interventions: Advised patient to schedule annual visit and flu vaccine.  Discussed [facility] services and support.  Patient decline           If you are experiencing a Mental Health or Goodnight or need someone to talk to, please call the Suicide and Crisis Lifeline: 988   Patient verbalizes understanding of instructions and care plan provided today and agrees to view in [place]. Active MyChart status and patient understanding of how to access instructions and care plan via MyChart confirmed with patient.     No further follow up required:    Jone Baseman, RN, MSN [facility] Care Management Coordinator Direct Line [phone]

## 2022-05-16 ENCOUNTER — Other Ambulatory Visit: Payer: Self-pay

## 2022-05-16 DIAGNOSIS — E785 Hyperlipidemia, unspecified: Secondary | ICD-10-CM

## 2022-05-16 MED ORDER — ROSUVASTATIN CALCIUM 5 MG PO TABS: 5.0000 mg | ORAL_TABLET | Freq: Every day | ORAL | 3 refills | Status: DC

## 2022-07-17 ENCOUNTER — Other Ambulatory Visit: Payer: Self-pay | Admitting: Family Medicine

## 2022-07-26 ENCOUNTER — Ambulatory Visit (INDEPENDENT_AMBULATORY_CARE_PROVIDER_SITE_OTHER): Payer: BC Managed Care – PPO | Admitting: Family Medicine

## 2022-07-26 ENCOUNTER — Encounter: Payer: Self-pay | Admitting: Family Medicine

## 2022-07-26 VITALS — BP 112/70 | HR 66 | Wt 223.0 lb

## 2022-07-26 DIAGNOSIS — E876 Hypokalemia: Secondary | ICD-10-CM | POA: Diagnosis not present

## 2022-07-26 DIAGNOSIS — Z1159 Encounter for screening for other viral diseases: Secondary | ICD-10-CM | POA: Diagnosis not present

## 2022-07-26 DIAGNOSIS — I10 Essential (primary) hypertension: Secondary | ICD-10-CM

## 2022-07-26 NOTE — Progress Notes (Signed)
    SUBJECTIVE:   CHIEF COMPLAINT / HPI:   Cramps Hypokalemia Was having lower extremity muscle cramps and muscle twitching last week but better now. Knows all her medications and dosages. Has been taking regularly. No chest pain or lightheadness or shortness of breath    Hypertension Taking medications regularly.  Drinks about 4 bottles of water at least a day.  Mild edema when stands all day at work    OBJECTIVE:   BP 112/70   Pulse 66   Wt 223 lb (101.2 kg)   BMI 33.91 kg/m   No distress Heart - Regular rate and rhythm.  No murmurs, gallops or rubs.    Lungs:  Normal respiratory effort, chest expands symmetrically. Lungs are clear to auscultation, no crackles or wheezes. Extremities:  No cyanosis, edema,noted with good range of motion of all major joints.     ASSESSMENT/PLAN:   Essential hypertension, benign Assessment & Plan: Well controlled Continue medications and monitor at home   Orders: -     Basic metabolic panel  Need for hepatitis C screening test -     Hepatitis C antibody  Hypokalemia Assessment & Plan: Dont think her muscle cramps are due to electrolytes but will check given her medications       Patient Instructions  Good to see you today - Thank you for coming in  Things we discussed today:  Cramps - stretch at least once a day - We will check your potassium  Your blood pressure is good Your goal blood pressure is less than 140/90.  Check your blood pressure several times a week.  If regularly higher than this please let me know - either with MyChart or leaving a phone message. Next visit please bring in your blood pressure cuff.     I will call you if your tests are not good.  Otherwise, I will send you a message on MyChart (if it is active) or a letter in the mail..  If you do not hear from me with in 2 weeks please call our office.     Please always bring your medication bottles  Come back to see me in 6 months    Lind Covert, [place]

## 2022-07-26 NOTE — Patient Instructions (Signed)
Good to see you today - Thank you for coming in  Things we discussed today:  Cramps - stretch at least once a day - We will check your potassium  Your blood pressure is good Your goal blood pressure is less than 140/90.  Check your blood pressure several times a week.  If regularly higher than this please let me know - either with MyChart or leaving a phone message. Next visit please bring in your blood pressure cuff.     I will call you if your tests are not good.  Otherwise, I will send you a message on MyChart (if it is active) or a letter in the mail..  If you do not hear from me with in 2 weeks please call our office.     Please always bring your medication bottles  Come back to see me in 6 months

## 2022-07-26 NOTE — Assessment & Plan Note (Signed)
Dont think her muscle cramps are due to electrolytes but will check given her medications

## 2022-07-26 NOTE — Assessment & Plan Note (Signed)
Well controlled Continue medications and monitor at home

## 2022-07-27 LAB — BASIC METABOLIC PANEL
BUN/Creatinine Ratio: 14 (ref 9–23)
BUN: 12 mg/dL (ref 6–24)
CO2: 25 mmol/L (ref 20–29)
Calcium: 9.4 mg/dL (ref 8.7–10.2)
Chloride: 104 mmol/L (ref 96–106)
Creatinine, Ser: 0.88 mg/dL (ref 0.57–1.00)
Glucose: 96 mg/dL (ref 70–99)
Potassium: 4 mmol/L (ref 3.5–5.2)
Sodium: 143 mmol/L (ref 134–144)
eGFR: 76 mL/min/{1.73_m2} (ref >59)

## 2022-07-27 LAB — HEPATITIS C ANTIBODY: Hep C Virus Ab: NONREACTIVE

## 2022-08-14 ENCOUNTER — Other Ambulatory Visit: Payer: Self-pay

## 2022-08-14 ENCOUNTER — Emergency Department (HOSPITAL_COMMUNITY)
Admission: EM | Admit: 2022-08-14 | Discharge: 2022-08-15 | Disposition: A | Discharge status: Home / Self Care | Payer: BC Managed Care – PPO | Attending: Emergency Medicine | Admitting: Emergency Medicine

## 2022-08-14 ENCOUNTER — Encounter (HOSPITAL_COMMUNITY): Payer: Self-pay | Admitting: Emergency Medicine

## 2022-08-14 ENCOUNTER — Emergency Department (HOSPITAL_COMMUNITY): Payer: BC Managed Care – PPO

## 2022-08-14 DIAGNOSIS — M25562 Pain in left knee: Secondary | ICD-10-CM | POA: Diagnosis not present

## 2022-08-14 DIAGNOSIS — N189 Chronic kidney disease, unspecified: Secondary | ICD-10-CM | POA: Insufficient documentation

## 2022-08-14 DIAGNOSIS — I129 Hypertensive chronic kidney disease with stage 1 through stage 4 chronic kidney disease, or unspecified chronic kidney disease: Secondary | ICD-10-CM | POA: Insufficient documentation

## 2022-08-14 DIAGNOSIS — Z041 Encounter for examination and observation following transport accident: Secondary | ICD-10-CM | POA: Diagnosis not present

## 2022-08-14 DIAGNOSIS — S8002XA Contusion of left knee, initial encounter: Secondary | ICD-10-CM

## 2022-08-14 DIAGNOSIS — M25512 Pain in left shoulder: Secondary | ICD-10-CM | POA: Diagnosis not present

## 2022-08-14 DIAGNOSIS — S40012A Contusion of left shoulder, initial encounter: Secondary | ICD-10-CM | POA: Diagnosis not present

## 2022-08-14 DIAGNOSIS — I10 Essential (primary) hypertension: Secondary | ICD-10-CM | POA: Diagnosis not present

## 2022-08-14 DIAGNOSIS — Y92410 Unspecified street and highway as the place of occurrence of the external cause: Secondary | ICD-10-CM | POA: Insufficient documentation

## 2022-08-14 DIAGNOSIS — M542 Cervicalgia: Secondary | ICD-10-CM | POA: Diagnosis not present

## 2022-08-14 DIAGNOSIS — M545 Low back pain, unspecified: Secondary | ICD-10-CM | POA: Diagnosis not present

## 2022-08-14 NOTE — ED Notes (Signed)
[place] collar applied in triage
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
## 2022-08-15 ENCOUNTER — Emergency Department (HOSPITAL_COMMUNITY): Payer: BC Managed Care – PPO

## 2022-08-15 ENCOUNTER — Other Ambulatory Visit: Payer: Self-pay

## 2022-08-15 ENCOUNTER — Ambulatory Visit (INDEPENDENT_AMBULATORY_CARE_PROVIDER_SITE_OTHER): Payer: BC Managed Care – PPO | Admitting: Family Medicine

## 2022-08-15 DIAGNOSIS — M25512 Pain in left shoulder: Secondary | ICD-10-CM

## 2022-08-15 DIAGNOSIS — S8002XA Contusion of left knee, initial encounter: Secondary | ICD-10-CM | POA: Diagnosis not present

## 2022-08-15 DIAGNOSIS — Z041 Encounter for examination and observation following transport accident: Secondary | ICD-10-CM | POA: Diagnosis not present

## 2022-08-15 MED ORDER — IBUPROFEN 400 MG PO TABS
400.0000 mg | ORAL_TABLET | Freq: Once | ORAL | Status: AC
  Administered 2022-08-15: 400 mg via ORAL
  Filled 2022-08-15: qty 1

## 2022-08-15 MED ORDER — BACLOFEN 10 MG PO TABS: 10.0000 mg | ORAL_TABLET | Freq: Three times a day (TID) | ORAL | 0 refills | Status: DC

## 2022-08-15 NOTE — Discharge Instructions (Signed)
Apply ice for 30 minutes at a time, 4 times a day. ° °Take acetaminophen as needed for pain. °
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
## 2022-08-15 NOTE — Patient Instructions (Addendum)
It was wonderful to see you today.  Please bring ALL of your medications with you to every visit.   Today we talked about:  Take Tylenol 650 mg every 6 hours scheduled for the next few days.  Take your Meloxicam at home once a day. Do not take Ibuprofen, Aleve when taking this.  I will prescribe a short course of a muscle relaxer.   Ice your knee and shoulder. Keep your shoulder clean and dry.  I have provided you a work note.   Thank you for coming to your visit as scheduled. We have had a large "no-show" problem lately, and this significantly limits our ability to see and care for patients. As a friendly reminder- if you cannot make your appointment please call to cancel. We do have a no show policy for those who do not cancel within 24 hours. Our policy is that if you miss or fail to cancel an appointment within 24 hours, 3 times in a 28-month period, you may be dismissed from our clinic.   Thank you for choosing [facility].   Please call [phone] with any questions about today's appointment.  Please be sure to schedule follow up at the front  desk before you leave today.   Sabino Dick, DO PGY-3 Family Medicine

## 2022-08-22 ENCOUNTER — Ambulatory Visit (INDEPENDENT_AMBULATORY_CARE_PROVIDER_SITE_OTHER): Payer: BC Managed Care – PPO | Admitting: Family Medicine

## 2022-08-22 ENCOUNTER — Encounter: Payer: Self-pay | Admitting: Family Medicine

## 2022-08-22 ENCOUNTER — Telehealth: Payer: Self-pay

## 2022-08-22 ENCOUNTER — Other Ambulatory Visit: Payer: Self-pay

## 2022-08-22 VITALS — BP 158/74 | HR 55 | Ht 68.0 in | Wt 219.2 lb

## 2022-08-22 DIAGNOSIS — I10 Essential (primary) hypertension: Secondary | ICD-10-CM

## 2022-08-22 DIAGNOSIS — R58 Hemorrhage, not elsewhere classified: Secondary | ICD-10-CM

## 2022-08-22 DIAGNOSIS — M25512 Pain in left shoulder: Secondary | ICD-10-CM

## 2022-08-22 MED ORDER — DICLOFENAC SODIUM 1 % EX GEL: 2.0000 g | Freq: Four times a day (QID) | CUTANEOUS | 1 refills | Status: DC | PRN

## 2022-08-22 NOTE — Patient Instructions (Signed)
It was wonderful to see you today.  Please bring ALL of your medications with you to every visit.   Today we talked about:  I am sorry you are still in pain! Continue to try and move around and stay active. I am sending Voltaren gel which you can use to your areas of pain. You can use heating pads as well to help with your muscles.   I have provided you an extended work note.   Thank you for coming to your visit as scheduled. We have had a large "no-show" problem lately, and this significantly limits our ability to see and care for patients. As a friendly reminder- if you cannot make your appointment please call to cancel. We do have a no show policy for those who do not cancel within 24 hours. Our policy is that if you miss or fail to cancel an appointment within 24 hours, 3 times in a 58-month period, you may be dismissed from our clinic.   Thank you for choosing [facility].   Please call [phone] with any questions about today's appointment.  Please be sure to schedule follow up at the front  desk before you leave today.   Sabino Dick, DO PGY-3 Family Medicine
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
## 2022-08-22 NOTE — Telephone Encounter (Signed)
Patient calls nurse line in regards to work note.   She reports she turned the note in to her supervisor, however the note needs to state she can return to work with no restrictions on 4/29.  Please let me know if I can rewrite note.   Will forward to provider who saw patient.

## 2022-08-23 NOTE — Telephone Encounter (Signed)
Received form for work. It is completed. Patient needs to sign and fill out the last couple of pages. Will place form in RN triage box for patient to pick up.

## 2022-08-24 ENCOUNTER — Encounter: Payer: Self-pay | Admitting: Family Medicine

## 2022-08-24 ENCOUNTER — Telehealth: Payer: Self-pay | Admitting: Family Medicine

## 2022-08-24 NOTE — Telephone Encounter (Signed)
Patient came in stating that she needs a note for work stating that she is able to return to work with no restrictions and full duty. She was previously written out until 08/29/22. She needs it by Friday if possible please

## 2022-08-24 NOTE — Telephone Encounter (Signed)
Hey, her last appointment was with you I saw your last note where you extended her work note. Just want to make sure she can return to work with no restrictions

## 2022-08-24 NOTE — Telephone Encounter (Signed)
Patient returns call to nurse line. Advised that paperwork is ready and at front desk.   Copy made and placed in batch scanning.   Veronda Prude, RN

## 2022-09-01 ENCOUNTER — Other Ambulatory Visit: Payer: Self-pay | Admitting: Family Medicine

## 2022-09-01 DIAGNOSIS — I10 Essential (primary) hypertension: Secondary | ICD-10-CM

## 2022-10-21 ENCOUNTER — Other Ambulatory Visit: Payer: Self-pay | Admitting: Family Medicine

## 2022-11-01 ENCOUNTER — Encounter: Payer: Self-pay | Admitting: Family Medicine

## 2022-11-01 ENCOUNTER — Ambulatory Visit (INDEPENDENT_AMBULATORY_CARE_PROVIDER_SITE_OTHER): Payer: BC Managed Care – PPO | Admitting: Family Medicine

## 2022-11-01 ENCOUNTER — Other Ambulatory Visit: Payer: Self-pay

## 2022-11-01 VITALS — BP 164/70 | HR 52 | Ht 68.0 in | Wt 216.6 lb

## 2022-11-01 DIAGNOSIS — I10 Essential (primary) hypertension: Secondary | ICD-10-CM

## 2022-11-01 DIAGNOSIS — R42 Dizziness and giddiness: Secondary | ICD-10-CM | POA: Diagnosis not present
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
## 2022-11-01 NOTE — Assessment & Plan Note (Signed)
Seems to have 2 components.  The vertigo is very consistent with  BPPV given episodic nature with head movement and no neurologic symptoms/signs.  Also may have component of dehydration with outside exercise in heat and blood pressure medications.  Recommend hydration and will check labs Monitor closely for resolution or worsening

## 2022-11-01 NOTE — Patient Instructions (Signed)
Good to see you today - Thank you for coming in  Things we discussed today:  Back Pain Tylenol three times a day for pain  Dizzines BPPV - vertigo   Drink electrolyte like Gatoade twice a day   If any weakness or visual changes or severe headache   I will call you if your tests are not good.  Otherwise, I will send you a message on MyChart (if it is active) or a letter in the mail..  If you do not hear from me with in 2 weeks please call our office.     Your goal blood pressure is less than 140/90.  Check your blood pressure several times a week.  If regularly higher than this please let me know - either with MyChart or leaving a phone message. Next visit please bring in your blood pressure cuff.     Please always bring your medication bottles  Come back to see me in one month

## 2022-11-01 NOTE — Assessment & Plan Note (Signed)
Not at goal but may be due to acute dizziness.  Check Bmet and monitor at home.

## 2022-11-02 LAB — BASIC METABOLIC PANEL
BUN/Creatinine Ratio: 14 (ref 9–23)
BUN: 13 mg/dL (ref 6–24)
CO2: 25 mmol/L (ref 20–29)
Calcium: 9.7 mg/dL (ref 8.7–10.2)
Chloride: 103 mmol/L (ref 96–106)
Creatinine, Ser: 0.91 mg/dL (ref 0.57–1.00)
Glucose: 93 mg/dL (ref 70–99)
Potassium: 3.9 mmol/L (ref 3.5–5.2)
Sodium: 140 mmol/L (ref 134–144)
eGFR: 73 mL/min/{1.73_m2} (ref >59)

## 2022-12-28 ENCOUNTER — Other Ambulatory Visit: Payer: Self-pay

## 2022-12-28 ENCOUNTER — Emergency Department (HOSPITAL_COMMUNITY)
Admission: EM | Admit: 2022-12-28 | Discharge: 2022-12-29 | Discharge status: Against Medical Advice | Payer: BC Managed Care – PPO | Attending: Emergency Medicine | Admitting: Emergency Medicine

## 2022-12-28 ENCOUNTER — Encounter (HOSPITAL_COMMUNITY): Payer: Self-pay | Admitting: *Deleted

## 2022-12-28 DIAGNOSIS — M545 Low back pain, unspecified: Secondary | ICD-10-CM | POA: Diagnosis not present

## 2022-12-28 DIAGNOSIS — Z5321 Procedure and treatment not carried out due to patient leaving prior to being seen by health care provider: Secondary | ICD-10-CM | POA: Diagnosis not present
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
## 2022-12-31 ENCOUNTER — Ambulatory Visit (HOSPITAL_COMMUNITY)
Admission: EM | Admit: 2022-12-31 | Discharge: 2022-12-31 | Disposition: A | Discharge status: Home / Self Care | Payer: BC Managed Care – PPO | Source: Home / Self Care

## 2022-12-31 ENCOUNTER — Encounter (HOSPITAL_COMMUNITY): Payer: Self-pay

## 2022-12-31 DIAGNOSIS — M62830 Muscle spasm of back: Secondary | ICD-10-CM | POA: Diagnosis not present

## 2022-12-31 DIAGNOSIS — M5440 Lumbago with sciatica, unspecified side: Secondary | ICD-10-CM

## 2022-12-31 DIAGNOSIS — G8929 Other chronic pain: Secondary | ICD-10-CM

## 2022-12-31 DIAGNOSIS — S39012D Strain of muscle, fascia and tendon of lower back, subsequent encounter: Secondary | ICD-10-CM

## 2022-12-31 MED ORDER — METHYLPREDNISOLONE SODIUM SUCC 125 MG IJ SOLR
INTRAMUSCULAR | Status: AC
  Filled 2022-12-31: qty 2

## 2022-12-31 MED ORDER — METHYLPREDNISOLONE SODIUM SUCC 125 MG IJ SOLR
60.0000 mg | Freq: Once | INTRAMUSCULAR | Status: AC
  Administered 2022-12-31: 60 mg via INTRAMUSCULAR

## 2022-12-31 MED ORDER — KETOROLAC TROMETHAMINE 30 MG/ML IJ SOLN
30.0000 mg | Freq: Once | INTRAMUSCULAR | Status: AC
  Administered 2022-12-31: 30 mg via INTRAMUSCULAR

## 2022-12-31 MED ORDER — NAPROXEN SODIUM 550 MG PO TABS: 550.0000 mg | ORAL_TABLET | Freq: Two times a day (BID) | ORAL | 0 refills | Status: DC

## 2022-12-31 MED ORDER — TIZANIDINE HCL 4 MG PO TABS: 4.0000 mg | ORAL_TABLET | Freq: Three times a day (TID) | ORAL | 0 refills | Status: DC | PRN

## 2022-12-31 MED ORDER — KETOROLAC TROMETHAMINE 30 MG/ML IJ SOLN
INTRAMUSCULAR | Status: AC
  Filled 2022-12-31: qty 1

## 2022-12-31 NOTE — Discharge Instructions (Addendum)
Recommend using ice for 20 minutes at least 3 times a day. Stop baclofen.  Start tizanidine as ordered.  May cause drowsiness so take first dose tonight. May benefit from stretching and massage therapy Consider using Salonpas patches or other over-the-counter patches Follow-up with PCP for referral to spine clinic and possible physical therapy.
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
## 2023-01-17 ENCOUNTER — Other Ambulatory Visit: Payer: Self-pay | Admitting: Family Medicine

## 2023-01-20 ENCOUNTER — Telehealth: Payer: Self-pay | Admitting: Pharmacist

## 2023-01-20 NOTE — Telephone Encounter (Signed)
Attempted to contact patient for follow-up of blood pressure/hypertension control.   Left HIPAA compliant voice mail requesting call back to direct phone: [phone] for scheduling blood pressure visit with clinic pharmacist.   Total time with patient call and documentation of interaction: 2 minutes.

## 2023-01-25 ENCOUNTER — Encounter: Payer: Self-pay | Admitting: Family Medicine

## 2023-01-25 ENCOUNTER — Ambulatory Visit (INDEPENDENT_AMBULATORY_CARE_PROVIDER_SITE_OTHER): Payer: BC Managed Care – PPO | Admitting: Family Medicine

## 2023-01-25 ENCOUNTER — Other Ambulatory Visit: Payer: Self-pay

## 2023-01-25 VITALS — BP 155/90 | HR 79 | Ht 68.0 in | Wt 216.6 lb

## 2023-01-25 DIAGNOSIS — I10 Essential (primary) hypertension: Secondary | ICD-10-CM

## 2023-01-25 NOTE — Assessment & Plan Note (Signed)
Not at goal but did not take her medications this AM.  Home readings are high.  Will increase indapamide to 2.5 mg daily and bring in blood pressure cuff next visit.

## 2023-01-25 NOTE — Patient Instructions (Signed)
Good to see you today - Thank you for coming in  Things we discussed today:  Indapamide take two tabs every day Continue the spironolactone and potassium  Your goal blood pressure is less than 135/85  Check your blood pressure several times a week.  If regularly higher than this please let me know - either with MyChart or leaving a phone message. Next visit please bring in your blood pressure cuff.     Please always bring your medication bottles  Come back to see me in 1 month

## 2023-01-25 NOTE — Progress Notes (Signed)
    SUBJECTIVE:   CHIEF COMPLAINT / HPI:   Last visit with me on 7/2.  Seen in ER for lumbar strain end of August   Essential hypertension, benign At home readings are in 170/90s .  Taking her medications regularly but did not take this am.  Not taking NSAIDS    Dizziness Improved and essentially resolved although yesterday had a brief episode of vertigo.  No further symptoms   OBJECTIVE:   BP (!) 155/90   Pulse 79   Ht 5\' 8"  (1.727 m)   Wt 216 lb 9.6 oz (98.2 kg)   SpO2 95%   BMI 32.93 kg/m   No distress Walking in room and hall normally  ASSESSMENT/PLAN:   HYPERTENSION, BENIGN SYSTEMIC Assessment & Plan: Not at goal but did not take her medications this AM.  Home readings are high.  Will increase indapamide to 2.5 mg daily and bring in blood pressure cuff next visit.       Patient Instructions  Good to see you today - Thank you for coming in  Things we discussed today:  Indapamide take two tabs every day Continue the spironolactone and potassium  Your goal blood pressure is less than 135/85  Check your blood pressure several times a week.  If regularly higher than this please let me know - either with MyChart or leaving a phone message. Next visit please bring in your blood pressure cuff.     Please always bring your medication bottles  Come back to see me in 1 month    Carney Living, MD [facility]

## 2023-02-21 ENCOUNTER — Encounter: Payer: Self-pay | Admitting: Family Medicine

## 2023-02-21 DIAGNOSIS — Z1231 Encounter for screening mammogram for malignant neoplasm of breast: Secondary | ICD-10-CM

## 2023-02-22 ENCOUNTER — Other Ambulatory Visit: Payer: Self-pay

## 2023-02-22 ENCOUNTER — Other Ambulatory Visit: Payer: Self-pay | Admitting: Family Medicine

## 2023-02-22 ENCOUNTER — Ambulatory Visit (INDEPENDENT_AMBULATORY_CARE_PROVIDER_SITE_OTHER): Payer: BC Managed Care – PPO | Admitting: Family Medicine

## 2023-02-22 VITALS — BP 135/68 | HR 60 | Ht 68.0 in | Wt 215.0 lb

## 2023-02-22 DIAGNOSIS — I10 Essential (primary) hypertension: Secondary | ICD-10-CM

## 2023-02-22 DIAGNOSIS — N644 Mastodynia: Secondary | ICD-10-CM

## 2023-02-22 NOTE — Patient Instructions (Addendum)
Good to see you today - Thank you for coming in  Things we discussed today:  Hypertension Your goal blood pressure is less than 135/85  Check your blood pressure several times a week.  I Let me know by MyChart what your readings are in 2 weeks  Breast Pain Get the mammogram   Next visit please bring in your blood pressure cuff.     Please always bring your medication bottles

## 2023-02-22 NOTE — Progress Notes (Unsigned)
    SUBJECTIVE:   CHIEF COMPLAINT / HPI:   Hypertension Here with her cuff and all medications.  Taking 2.5 mg of indapamide and 25 my spirolactone and K.  Her home blood pressure readings mostly in upper 140-150s/80s to low 90s  L Breast Pain Focally around 11 oclock comes and goes worse when she palpates the are IF she is already having pain. Not tender if she just palpates.  No mass Area is where she had lumpectomy  OBJECTIVE:   BP 135/68   Pulse 60   Ht 5\' 8"  (1.727 m)   Wt 215 lb (97.5 kg)   SpO2 100%   BMI 32.69 kg/m   L Breast - mildly tender around outer 11 oclock position without mass or fluctuance Her home blood pressure monitor reading matches ours 137/72 Heart - Regular rate and rhythm.  No murmurs, gallops or rubs.    Lungs:  Normal respiratory effort, chest expands symmetrically. Lungs are clear to auscultation, no crackles or wheezes.   ASSESSMENT/PLAN:   Breast pain, left -     MM 3D DIAGNOSTIC MAMMOGRAM BILATERAL BREAST; Future  HYPERTENSION, BENIGN SYSTEMIC Assessment & Plan: Given her resting reading here was normal despite home readings being high will monitor at home on current regimen      Patient Instructions  Good to see you today - Thank you for coming in  Things we discussed today:  Hypertension Your goal blood pressure is less than 135/85  Check your blood pressure several times a week.  I Let me know by MyChart what your readings are in 2 weeks  Breast Pain Get the mammogram   Next visit please bring in your blood pressure cuff.     Please always bring your medication bottles     Carney Living, MD [facility]

## 2023-02-22 NOTE — Assessment & Plan Note (Signed)
Given her resting reading here was normal despite home readings being high will monitor at home on current regimen

## 2023-02-26 ENCOUNTER — Other Ambulatory Visit: Payer: Self-pay | Admitting: Family Medicine

## 2023-03-03 ENCOUNTER — Other Ambulatory Visit: Payer: Self-pay | Admitting: Family Medicine

## 2023-03-03 DIAGNOSIS — I10 Essential (primary) hypertension: Secondary | ICD-10-CM

## 2023-03-03 DIAGNOSIS — E785 Hyperlipidemia, unspecified: Secondary | ICD-10-CM

## 2023-03-10 ENCOUNTER — Ambulatory Visit
Admission: RE | Admit: 2023-03-10 | Discharge: 2023-03-10 | Disposition: A | Discharge status: Home / Self Care | Payer: BC Managed Care – PPO | Source: Ambulatory Visit | Attending: Family Medicine | Admitting: Family Medicine

## 2023-03-10 ENCOUNTER — Encounter: Payer: Self-pay | Admitting: Student

## 2023-03-10 ENCOUNTER — Ambulatory Visit: Payer: BC Managed Care – PPO

## 2023-03-10 ENCOUNTER — Ambulatory Visit (INDEPENDENT_AMBULATORY_CARE_PROVIDER_SITE_OTHER): Payer: BC Managed Care – PPO | Admitting: Student

## 2023-03-10 VITALS — BP 148/79 | HR 83 | Ht 68.0 in | Wt 213.0 lb

## 2023-03-10 DIAGNOSIS — Z0000 Encounter for general adult medical examination without abnormal findings: Secondary | ICD-10-CM

## 2023-03-10 DIAGNOSIS — I10 Essential (primary) hypertension: Secondary | ICD-10-CM | POA: Diagnosis not present

## 2023-03-10 DIAGNOSIS — N644 Mastodynia: Secondary | ICD-10-CM

## 2023-03-10 DIAGNOSIS — R921 Mammographic calcification found on diagnostic imaging of breast: Secondary | ICD-10-CM | POA: Diagnosis not present

## 2023-03-10 NOTE — Progress Notes (Signed)
    SUBJECTIVE:   Chief compliant/HPI: annual examination  [name] is a 57 y.o. who presents today for an annual exam.   HTN: pressure at home 143/82, increased Indapamide with Dr. Deirdre Priest recently. Patient just took her medications right before arrival to the clinic.   History tabs reviewed and updated.   Review of systems form reviewed  OBJECTIVE:   BP (!) 148/79   Pulse 83   Ht 5\' 8"  (1.727 m)   Wt 213 lb (96.6 kg)   SpO2 100%   BMI 32.39 kg/m   General: Alert and oriented in no apparent distress Heart: Regular rate and rhythm with no murmurs appreciated Lungs: CTA bilaterally, no wheezing Abdomen: Bowel sounds present, no abdominal pain Skin: Warm and dry Extremities: No lower extremity edema   ASSESSMENT/PLAN:   Assessment & Plan Annual physical exam   Annual Examination  See AVS for age appropriate recommendations  Filled out paperwork for work and put copy of the paperwork in media tab  PHQ score     03/10/2023   11:28 AM 01/25/2023   11:30 AM 11/01/2022   11:17 AM  PHQ9 SCORE ONLY  PHQ-9 Total Score 0 0 0  reviewed and discussed.  BP reviewed. Cannot tolerate several antiHTN.  Recently had indapamide increased, instructed her to continue checking blood pressures as she has a follow-up visit with Dr. Deirdre Priest.  Unfortunately, unable to obtain appropriate blood pressure as she just took her medications.  Continue checking pressures at home.  Will obtain BMP as the patient increased indapamide recently with history of hypokalemia.   Considered the following items based upon USPSTF recommendations: Diabetes screening: ordered Screening for elevated cholesterol: ordered HIV testing: NR 10 years ago -- ordered again, low risk patient  Hepatitis C: NR 7 mo ago  Syphilis if at high risk: ordered GC/CT not at high risk and not ordered. Osteoporosis screening considered based upon risk of fracture from [facility] calculator. Major osteoporotic fracture  risk is 5.7%. DEXA not ordered.  Reviewed risk factors for latent tuberculosis and not indicated   Cervical cancer screening: prior Pap reviewed, repeat due in 5 years Breast cancer screening: - previously scheduled for MM upcoming Colorectal cancer screening: UTD - due in 2026, was nml 2016     Alfredo Martinez, MD [facility]

## 2023-03-10 NOTE — Patient Instructions (Addendum)
It was great to see you today! Thank you for choosing [facility] for your primary care.  Today we addressed: We will check some labs today Please follow up with Dr. Deirdre Priest for BP  Keep an eye on it at home   If you haven't already, sign up for My Chart to have easy access to your labs results, and communication with your primary care physician. We are checking some labs today. If they are abnormal, I will call you. If they are normal, I will send you a MyChart message (if it is active) or a letter in the mail. If you do not hear about your labs in the next 2 weeks, please call the office. I recommend that you always bring your medications to each appointment as this makes it easy to ensure you are on the correct medications and helps [place] not miss refills when you need them. Call the clinic at [phone] if your symptoms worsen or you have any concerns. No follow-ups on file. Please arrive 15 minutes before your appointment to ensure smooth check in process.  We appreciate your efforts in making this happen.  Thank you for allowing me to participate in your care, Alfredo Martinez, MD 03/10/2023, 11:44 AM PGY-3, [facility]

## 2023-03-11 LAB — HEMOGLOBIN A1C
Est. average glucose Bld gHb Est-mCnc: 143 mg/dL
Hgb A1c MFr Bld: 6.6 % — ABNORMAL HIGH (ref 4.8–5.6)

## 2023-03-11 LAB — LIPID PANEL
Chol/HDL Ratio: 3 ratio (ref 0.0–4.4)
Cholesterol, Total: 121 mg/dL (ref 100–199)
HDL: 40 mg/dL (ref >39)
LDL Chol Calc (NIH): 59 mg/dL (ref 0–99)
Triglycerides: 120 mg/dL (ref 0–149)
VLDL Cholesterol Cal: 22 mg/dL (ref 5–40)

## 2023-03-11 LAB — BASIC METABOLIC PANEL
BUN/Creatinine Ratio: 16 (ref 9–23)
BUN: 15 mg/dL (ref 6–24)
CO2: 23 mmol/L (ref 20–29)
Calcium: 9.4 mg/dL (ref 8.7–10.2)
Chloride: 103 mmol/L (ref 96–106)
Creatinine, Ser: 0.96 mg/dL (ref 0.57–1.00)
Glucose: 94 mg/dL (ref 70–99)
Potassium: 3.6 mmol/L (ref 3.5–5.2)
Sodium: 140 mmol/L (ref 134–144)
eGFR: 69 mL/min/{1.73_m2} (ref >59)

## 2023-03-11 LAB — HIV ANTIBODY (ROUTINE TESTING W REFLEX): HIV Screen 4th Generation wRfx: NONREACTIVE

## 2023-03-11 LAB — RPR: RPR Ser Ql: NONREACTIVE

## 2023-03-13 NOTE — Progress Notes (Signed)
A1C 6.6 - meets diabetes criteria, consider GLP1/Metformin on next visit. Will need annual diabetes screenings. Otherwise, labwork is negative

## 2023-03-24 ENCOUNTER — Other Ambulatory Visit: Payer: Self-pay | Admitting: Family Medicine

## 2023-03-27 DIAGNOSIS — E119 Type 2 diabetes mellitus without complications: Secondary | ICD-10-CM | POA: Insufficient documentation

## 2023-03-27 NOTE — Progress Notes (Unsigned)
    SUBJECTIVE:   CHIEF COMPLAINT / HPI:   Hypertension Home readings by memory mostly in 130-140s/ 80s Taking her medication regularly Did rush in from work   Breast Pain Resolved  Elevated Blood sugar Lab Results  Component Value Date   HGBA1C 6.1 03/28/2023  Watching her diet and has lost 5 lbs since September.  All her blood sugar on bmets have been good.  Does have family history of diabetes   Lump L Shoulder Small smooth tender area on anterior L shoulder - Just noticed.  No known recent trauma or redness or fever or discharge    OBJECTIVE:   BP (!) 145/62   Pulse (!) 58   Ht 5\' 8"  (1.727 m)   Wt 211 lb (95.7 kg)   SpO2 100%   BMI 32.08 kg/m   Heart - Regular rate and rhythm.  No murmurs, gallops or rubs.    Lungs:  Normal respiratory effort, chest expands symmetrically. Lungs are clear to auscultation, no crackles or wheezes. Extrem - chronic swelling of L ankle sp surgery L Shoulder - anterior smooth soft mildly tender < 1cm isolated subcutaneous mass  ASSESSMENT/PLAN:   HYPERTENSION, BENIGN SYSTEMIC Near goal.  No good choices for increasing her medications.  Continue to monitor at home and encourage her weight control.    Prediabetes Her recent A1c of 6.6 did not correlate with her blood sugar on bmets and today's repeat is significantly decreased to 6.1.  which is more expected.  Continue diet and exercise and would recheck in 6 months       Patient Instructions  Good to see you today - Thank you for coming in  Things we discussed today:  Prediabetes - the main treatment is continuing to control your weight and limit sweet fatty food intake Should have your A1c checked again in 6 months  Your goal blood pressure is less than 135/85  Check your blood pressure several times a week.  If regularly higher than this then come in for a visit.  Please bring in your blood pressure cuff.     If the spot on your L shoulder becomes more tender or grows come  back to have it checked  Please always bring your medication bottles  Come back to check your blood sugar and blood pressure in 6 months  I will miss working with you - Be Well    Carney Living, MD [facility]

## 2023-03-28 ENCOUNTER — Ambulatory Visit: Payer: BC Managed Care – PPO | Admitting: Family Medicine

## 2023-03-28 ENCOUNTER — Encounter: Payer: Self-pay | Admitting: Family Medicine

## 2023-03-28 VITALS — BP 145/62 | HR 58 | Ht 68.0 in | Wt 211.0 lb

## 2023-03-28 DIAGNOSIS — R7303 Prediabetes: Secondary | ICD-10-CM | POA: Insufficient documentation

## 2023-03-28 DIAGNOSIS — I10 Essential (primary) hypertension: Secondary | ICD-10-CM | POA: Diagnosis not present

## 2023-03-28 DIAGNOSIS — E119 Type 2 diabetes mellitus without complications: Secondary | ICD-10-CM

## 2023-03-28 LAB — POCT GLYCOSYLATED HEMOGLOBIN (HGB A1C): HbA1c, POC (controlled diabetic range): 6.1 % (ref 0.0–7.0)

## 2023-03-28 NOTE — Assessment & Plan Note (Signed)
Her recent A1c of 6.6 did not correlate with her blood sugar on bmets and today's repeat is significantly decreased to 6.1.  which is more expected.  Continue diet and exercise and would recheck in 6 months

## 2023-03-28 NOTE — Assessment & Plan Note (Signed)
Near goal.  No good choices for increasing her medications.  Continue to monitor at home and encourage her weight control.

## 2023-03-28 NOTE — Patient Instructions (Addendum)
Good to see you today - Thank you for coming in  Things we discussed today:  Prediabetes - the main treatment is continuing to control your weight and limit sweet fatty food intake Should have your A1c checked again in 6 months  Your goal blood pressure is less than 135/85  Check your blood pressure several times a week.  If regularly higher than this then come in for a visit.  Please bring in your blood pressure cuff.     If the spot on your L shoulder becomes more tender or grows come back to have it checked  Please always bring your medication bottles  Come back to check your blood sugar and blood pressure in 6 months  I will miss working with you - Be Well

## 2023-06-02 ENCOUNTER — Ambulatory Visit (INDEPENDENT_AMBULATORY_CARE_PROVIDER_SITE_OTHER): Payer: BC Managed Care – PPO | Admitting: Family Medicine

## 2023-06-02 ENCOUNTER — Encounter: Payer: Self-pay | Admitting: Family Medicine

## 2023-06-02 VITALS — BP 122/80 | HR 62 | Ht 68.0 in | Wt 209.2 lb

## 2023-06-02 DIAGNOSIS — E785 Hyperlipidemia, unspecified: Secondary | ICD-10-CM

## 2023-06-02 DIAGNOSIS — I10 Essential (primary) hypertension: Secondary | ICD-10-CM | POA: Diagnosis not present

## 2023-06-02 DIAGNOSIS — Z0100 Encounter for examination of eyes and vision without abnormal findings: Secondary | ICD-10-CM

## 2023-06-02 DIAGNOSIS — R7309 Other abnormal glucose: Secondary | ICD-10-CM | POA: Diagnosis not present

## 2023-06-02 DIAGNOSIS — Z23 Encounter for immunization: Secondary | ICD-10-CM

## 2023-06-02 DIAGNOSIS — R7303 Prediabetes: Secondary | ICD-10-CM

## 2023-06-02 DIAGNOSIS — M25819 Other specified joint disorders, unspecified shoulder: Secondary | ICD-10-CM | POA: Insufficient documentation

## 2023-06-02 NOTE — Assessment & Plan Note (Signed)
She was previously in the diabetic A1C range Recent A1C reviewed Foot exam completed She requested referral to an eye doctor Repeat A1C in May

## 2023-06-02 NOTE — Assessment & Plan Note (Signed)
Patient reassured, this feels benign Red flag signs/symptoms discussed Monitor for now She agreed with the plan

## 2023-06-02 NOTE — Patient Instructions (Signed)

## 2023-06-02 NOTE — Progress Notes (Signed)
    SUBJECTIVE:   CHIEF COMPLAINT / HPI:   PreDM/HLD/HTN/Cyst: She is here for the follow-up. She is compliant with Aldactone 25 mg QD and Indapamide 2.5 mg QAM. She has yet to take her Indapamide today. She self discontinued her Crestor 3 weeks ago due to intolerance with body aches and knots on her body. She has a knot on her left shoulder, which she previously discussed with Dr. Deirdre Priest. The knot has not changed in size, and she denies any pain.  Since she got off Cholesterol meds, there has been no more body pain or new knot formation.   PERTINENT  PMH / PSH: PMhx reviewed  OBJECTIVE:   BP 122/80   Pulse 62   Ht 5\' 8"  (1.727 m)   Wt 209 lb 3.2 oz (94.9 kg)   SpO2 100%   BMI 31.81 kg/m   Physical Exam Vitals and nursing note reviewed.  Cardiovascular:     Rate and Rhythm: Normal rate and regular rhythm.     Heart sounds: Normal heart sounds. No murmur heard. Pulmonary:     Effort: Pulmonary effort is normal. No respiratory distress.     Breath sounds: Normal breath sounds. No wheezing.  Musculoskeletal:     Comments: Sensory exam of the foot is normal, tested with the monofilament. Good pulses, no lesions or ulcers, good peripheral pulses. Old scar on her left foot anteriorly -(Hx of ankle surgery from fracture in 2011)  Small, 1cm or less rubbery, firm, mobile, non-tender, non-erythematous nodule over her left shoulder.       ASSESSMENT/PLAN:   HYPERTENSION, BENIGN SYSTEMIC Stable on her current regimen May tak all BP meds at night from 6 pm Monitor BP closely F/U in May for reassessment and lab work  Hyperlipidemia Statin intolerance She self d/ced Crestor Will hold for now LDL looks very good Lifestyle modification discussed Repeat FLP in May Will discussed resumption of Statin if needed  Prediabetes She was previously in the diabetic A1C range Recent A1C reviewed Foot exam completed She requested referral to an eye doctor Repeat A1C in May  Cyst  of joint of shoulder Patient reassured, this feels benign Red flag signs/symptoms discussed Monitor for now She agreed with the plan   Tdap today  Janit Pagan, MD [facility]

## 2023-06-02 NOTE — Assessment & Plan Note (Signed)
Statin intolerance She self d/ced Crestor Will hold for now LDL looks very good Lifestyle modification discussed Repeat FLP in May Will discussed resumption of Statin if needed

## 2023-06-02 NOTE — Assessment & Plan Note (Signed)
Stable on her current regimen May tak all BP meds at night from 6 pm Monitor BP closely F/U in May for reassessment and lab work

## 2023-06-03 LAB — MICROALBUMIN / CREATININE URINE RATIO
Creatinine, Urine: 76.4 mg/dL
Microalb/Creat Ratio: 7 mg/g{creat} (ref 0–29)
Microalbumin, Urine: 5.4 ug/mL

## 2023-06-05 ENCOUNTER — Encounter: Payer: Self-pay | Admitting: Family Medicine

## 2023-06-05 ENCOUNTER — Telehealth: Payer: Self-pay | Admitting: Family Medicine

## 2023-06-05 NOTE — Telephone Encounter (Signed)
HIPAA compliant callback message left. Please advise, normal urine protein test result. Plan to repeat in 1 yr.

## 2023-06-30 DIAGNOSIS — N281 Cyst of kidney, acquired: Secondary | ICD-10-CM | POA: Diagnosis not present

## 2023-07-02 ENCOUNTER — Encounter (HOSPITAL_COMMUNITY): Payer: Self-pay

## 2023-07-02 ENCOUNTER — Ambulatory Visit (HOSPITAL_COMMUNITY)
Admission: EM | Admit: 2023-07-02 | Discharge: 2023-07-02 | Disposition: A | Discharge status: Home / Self Care | Attending: Neurology | Admitting: Neurology

## 2023-07-02 DIAGNOSIS — M25551 Pain in right hip: Secondary | ICD-10-CM | POA: Diagnosis not present

## 2023-07-02 MED ORDER — NAPROXEN 375 MG PO TABS: 375.0000 mg | ORAL_TABLET | Freq: Two times a day (BID) | ORAL | 0 refills | Status: DC | PRN

## 2023-07-02 MED ORDER — METHYLPREDNISOLONE SODIUM SUCC 125 MG IJ SOLR
80.0000 mg | Freq: Once | INTRAMUSCULAR | Status: AC
  Administered 2023-07-02: 80 mg via INTRAMUSCULAR

## 2023-07-02 MED ORDER — KETOROLAC TROMETHAMINE 30 MG/ML IJ SOLN
INTRAMUSCULAR | Status: AC
  Filled 2023-07-02: qty 1

## 2023-07-02 MED ORDER — METHYLPREDNISOLONE SODIUM SUCC 125 MG IJ SOLR
INTRAMUSCULAR | Status: AC
  Filled 2023-07-02: qty 2

## 2023-07-02 MED ORDER — KETOROLAC TROMETHAMINE 30 MG/ML IJ SOLN
30.0000 mg | Freq: Once | INTRAMUSCULAR | Status: AC
  Administered 2023-07-02: 30 mg via INTRAMUSCULAR

## 2023-07-02 NOTE — ED Triage Notes (Signed)
 Patient here today with c/o right lateral hip pain that radiates down her right leg X 1-2 week. Patient states that she has increased pain with using stairs and bending. Patient has a h/o low back pain.

## 2023-07-02 NOTE — ED Provider Notes (Signed)
 [facility]    CSN: [id number] Arrival date & time: 07/02/23  1322      History   Chief Complaint Chief Complaint  Patient presents with   Hip Pain    HPI [name] is a 58 y.o. female.   History of back pain Methylprednisolone and toradol injection for back pain in August that helped.  With her PCP, but she did not follow-up with orthopedics. Has used meloxicam over the last week without relief.  Her hip pain started about 1 week ago.  She does have a highly active job that involves a lot of bending and lifting of heavy objects.  She is noticing a sharp pain when she is walking up and down the stairs and when she goes from sitting to standing.  It does radiate down her leg, but does not radiate into her back.  She does have pain with palpation on her right hip no pain with palpation over her back or spine.  She is able to twist at back without discomfort she does have significant discomfort when she reaches down to touch her toes.   Hip Pain    Past Medical History:  Diagnosis Date   Allergy    Arthritis    Lt ankle   Chronic kidney disease    right renal cyst    GERD (gastroesophageal reflux disease)    hx of no problems at present    Headache(784.0)    occasional headache    Hypertension    Influenza 04/20/2012   PONV (postoperative nausea and vomiting)    N/V and trouble urinating after surgery     Patient Active Problem List   Diagnosis Date Noted   Cyst of joint of shoulder 06/02/2023   Prediabetes 03/28/2023   Hypokalemia 03/09/2022   Hyperlipidemia 03/08/2021   Nickel allergy 11/29/2017   Angioedema 06/16/2016   Obesity (BMI 30-39.9) 11/17/2010   Breast ductal hyperplasia, atypical 10/30/2010   KIDNEY CYST, ACQUIRED 03/19/2007   HYPERTENSION, BENIGN SYSTEMIC 06/29/2006    Past Surgical History:  Procedure Laterality Date   BREAST EXCISIONAL BIOPSY Left    BREAST LUMPECTOMY     BREAST SURGERY  2010   lumpectomy Lt breast    CARPAL TUNNEL RELEASE Right 08/06/2015   Procedure: RIGHT CARPAL TUNNEL RELEASE;  Surgeon: Betha Loa, MD;  Location: [facility];  Service: Orthopedics;  Laterality: Right;   DORSAL COMPARTMENT RELEASE Right 08/06/2015   Procedure: RIGHT FIRST RELEASE DORSAL COMPARTMENT (DEQUERVAIN);  Surgeon: Betha Loa, MD;  Location: [facility];  Service: Orthopedics;  Laterality: Right;   ENDOMETRIAL ABLATION  10/18/2010   at [facility]   FRACTURE SURGERY  2011   Lt ankle surgeries x 3    GANGLION CYST EXCISION Right 07/02/2021   Procedure: RIGHT WRIST EXCISION DORSAL GANGLION;  Surgeon: Betha Loa, MD;  Location: [facility];  Service: Orthopedics;  Laterality: Right;   GANGLION CYST EXCISION Right 03/10/2022   Procedure: RIGHT WRIST EXCISION RECURRENT DORSAL GANGLION;  Surgeon: Betha Loa, MD;  Location: [facility];  Service: Orthopedics;  Laterality: Right;  45 MIN   JOINT REPLACEMENT     left ankle 8/12    LAPAROSCOPIC NEPHRECTOMY  03/09/2011   Procedure: LAPAROSCOPIC NEPHRECTOMY;  Surgeon: Marcine Matar;  Location: [facility];  Service: Urology;  Laterality: Right;  Laparoscopic Assisted Removal Of Renal Cyst    OTHER SURGICAL HISTORY     2 hand surgeries on left and shoulder surgery on  right shoulder    TOTAL ANKLE ARTHROPLASTY Left 07/05/2012   Procedure: Removal of total ankle implants,I & D with insertion of  new poly spacer;  Surgeon: Toni Arthurs, MD;  Location: [facility];  Service: Orthopedics;  Laterality: Left;   TUBAL LIGATION  1988    OB History   No obstetric history on file.      Home Medications    Prior to Admission medications   Medication Sig Start Date End Date Taking? Authorizing Provider  naproxen (NAPROSYN) 375 MG tablet Take 1 tablet (375 mg total) by mouth 2 (two) times daily as needed. 07/04/23  Yes Prudencio Velazco, Ludger Nutting, NP  indapamide (LOZOL) 2.5 MG tablet Take 1 tablet (2.5 mg total) by mouth daily. 03/06/23   Chambliss,  Estill Batten, MD  potassium chloride 20 MEQ/15ML (10%) SOLN TAKE 15 MLS (20 MEQ TOTAL) BY MOUTH DAILY. 03/24/23   Carney Living, MD  spironolactone (ALDACTONE) 25 MG tablet TAKE 1 TABLET BY MOUTH EVERY DAY 03/06/23   Chambliss, Estill Batten, MD    Family History Family History  Problem Relation Age of Onset   Diabetes Mother    Heart disease Mother    Hypertension Father    Diabetes Sister    Kidney disease Sister    Hypertension Brother    Kidney disease Brother    Arthritis Brother    Cancer Maternal Grandmother    Diabetes Sister    Hypertension Sister    Hypertension Sister    Hypertension Sister    Hypertension Brother    Mental illness Brother    Breast cancer Niece    Colon cancer Neg Hx     Social History Social History   Tobacco Use   Smoking status: Never    Passive exposure: Never   Smokeless tobacco: Never  Vaping Use   Vaping status: Never Used  Substance Use Topics   Alcohol use: No    Alcohol/week: 0.0 standard drinks of alcohol   Drug use: No     Allergies   Tetracycline hcl, Angiotensin receptor blockers, Nickel, Septra [bactrim], and Sulfamethoxazole-trimethoprim   Review of Systems Review of Systems   Physical Exam Triage Vital Signs ED Triage Vitals  Encounter Vitals Group     BP 07/02/23 1412 131/82     Systolic BP Percentile --      Diastolic BP Percentile --      Pulse Rate 07/02/23 1412 (!) 54     Resp 07/02/23 1412 16     Temp 07/02/23 1412 98.9 F (37.2 C)     Temp Source 07/02/23 1412 Oral     SpO2 07/02/23 1412 97 %     Weight 07/02/23 1411 206 lb (93.4 kg)     Height 07/02/23 1411 5\' 8"  (1.727 m)     Head Circumference --      Peak Flow --      Pain Score 07/02/23 1411 10     Pain Loc --      Pain Education --      Exclude from Growth Chart --    No data found.  Updated Vital Signs BP 131/82 (BP Location: Left Arm)   Pulse (!) 54   Temp 98.9 F (37.2 C) (Oral)   Resp 16   Ht 5\' 8"  (1.727 m)   Wt 206 lb  (93.4 kg)   SpO2 97%   BMI 31.32 kg/m   Visual Acuity Right Eye Distance:   Left Eye Distance:   Bilateral Distance:  Right Eye Near:   Left Eye Near:    Bilateral Near:     Physical Exam Vitals and nursing note reviewed.  Constitutional:      General: She is not in acute distress.    Appearance: She is well-developed.  HENT:     Head: Normocephalic and atraumatic.  Eyes:     Conjunctiva/sclera: Conjunctivae normal.  Cardiovascular:     Rate and Rhythm: Normal rate and regular rhythm.     Heart sounds: No murmur heard. Pulmonary:     Effort: Pulmonary effort is normal. No respiratory distress.     Breath sounds: Normal breath sounds.  Abdominal:     Palpations: Abdomen is soft.     Tenderness: There is no abdominal tenderness.  Musculoskeletal:        General: No swelling.     Cervical back: Neck supple.     Right hip: Tenderness present. Decreased range of motion.     Right upper leg: Normal.     Left upper leg: Normal.     Right knee: Normal. Normal range of motion.     Left knee: Normal.  Skin:    General: Skin is warm and dry.     Capillary Refill: Capillary refill takes less than 2 seconds.  Neurological:     Mental Status: She is alert.  Psychiatric:        Mood and Affect: Mood normal.      UC Treatments / Results  Labs (all labs ordered are listed, but only abnormal results are displayed) Labs Reviewed - No data to display  EKG   Radiology No results found.  Procedures Procedures (including critical care time)  Medications Ordered in UC Medications  methylPREDNISolone sodium succinate (SOLU-MEDROL) 125 mg/2 mL injection 80 mg (has no administration in time range)  ketorolac (TORADOL) 30 MG/ML injection 30 mg (has no administration in time range)    Initial Impression / Assessment and Plan / UC Course  I have reviewed the triage vital signs and the nursing notes.  Pertinent labs & imaging results that were available during my care of  the patient were reviewed by me and considered in my medical decision making (see chart for details).   Defer xray imaging as no trauma or fall.  She is not currently having any lower back pain but is having significant discomfort of her hip with sharp pain when she puts weight on her right leg.  Denies recent trauma, however she is currently working and does have a highly physical job.  Will proceed with a steroid injection and Toradol for immediate relief and then she will need to follow-up with her PCP for possible referral to orthopedics for further management of her pain.       Final Clinical Impressions(s) / UC Diagnoses   Final diagnoses:  Right hip pain     Discharge Instructions      I gave you a steroid shot in the clinic to help reduce inflammation and pain. May start taking naproxen in 24 hours as needed for pain Take muscle relaxer at bedtime as needed for muscle spasm.  No that the muscle relaxer may make you sleepy, so do not take this during the daytime or when drinking/driving.  Please schedule an appointment for follow-up with your primary care provider or the orthopedic provider listed on your paperwork.      ED Prescriptions     Medication Sig Dispense Auth. Provider   naproxen (NAPROSYN) 375 MG tablet Take  1 tablet (375 mg total) by mouth 2 (two) times daily as needed. 20 tablet Elmer Picker, NP      PDMP not reviewed this encounter.   Elmer Picker, NP 07/02/23 [phone]

## 2023-07-02 NOTE — Discharge Instructions (Signed)
 I gave you a steroid shot in the clinic to help reduce inflammation and pain. May start taking naproxen in 24 hours as needed for pain Take muscle relaxer at bedtime as needed for muscle spasm.  No that the muscle relaxer may make you sleepy, so do not take this during the daytime or when drinking/driving.  Please schedule an appointment for follow-up with your primary care provider or the orthopedic provider listed on your paperwork.

## 2023-07-29 ENCOUNTER — Other Ambulatory Visit: Payer: Self-pay | Admitting: Family Medicine

## 2023-07-31 ENCOUNTER — Encounter: Payer: Self-pay | Admitting: Family Medicine

## 2023-08-03 ENCOUNTER — Other Ambulatory Visit: Payer: Self-pay

## 2023-08-03 ENCOUNTER — Other Ambulatory Visit (HOSPITAL_COMMUNITY): Payer: Self-pay

## 2023-08-03 ENCOUNTER — Observation Stay (HOSPITAL_COMMUNITY)

## 2023-08-03 ENCOUNTER — Observation Stay (HOSPITAL_COMMUNITY)
Admission: EM | Admit: 2023-08-03 | Discharge: 2023-08-04 | Disposition: A | Discharge status: Home / Self Care | Attending: Family Medicine | Admitting: Family Medicine

## 2023-08-03 ENCOUNTER — Encounter (HOSPITAL_COMMUNITY): Payer: Self-pay | Admitting: Student

## 2023-08-03 ENCOUNTER — Emergency Department (HOSPITAL_COMMUNITY)

## 2023-08-03 DIAGNOSIS — I10 Essential (primary) hypertension: Secondary | ICD-10-CM | POA: Diagnosis not present

## 2023-08-03 DIAGNOSIS — R002 Palpitations: Secondary | ICD-10-CM | POA: Diagnosis not present

## 2023-08-03 DIAGNOSIS — E1122 Type 2 diabetes mellitus with diabetic chronic kidney disease: Secondary | ICD-10-CM | POA: Insufficient documentation

## 2023-08-03 DIAGNOSIS — I4891 Unspecified atrial fibrillation: Principal | ICD-10-CM

## 2023-08-03 DIAGNOSIS — E119 Type 2 diabetes mellitus without complications: Secondary | ICD-10-CM | POA: Diagnosis not present

## 2023-08-03 DIAGNOSIS — Z79899 Other long term (current) drug therapy: Secondary | ICD-10-CM | POA: Diagnosis not present

## 2023-08-03 DIAGNOSIS — I129 Hypertensive chronic kidney disease with stage 1 through stage 4 chronic kidney disease, or unspecified chronic kidney disease: Secondary | ICD-10-CM | POA: Insufficient documentation

## 2023-08-03 DIAGNOSIS — Z86711 Personal history of pulmonary embolism: Secondary | ICD-10-CM | POA: Insufficient documentation

## 2023-08-03 DIAGNOSIS — N189 Chronic kidney disease, unspecified: Secondary | ICD-10-CM | POA: Diagnosis not present

## 2023-08-03 DIAGNOSIS — Z96652 Presence of left artificial knee joint: Secondary | ICD-10-CM | POA: Insufficient documentation

## 2023-08-03 LAB — APTT: aPTT: 23 s — ABNORMAL LOW (ref 24–36)

## 2023-08-03 LAB — BASIC METABOLIC PANEL WITH GFR
Anion gap: 10 (ref 5–15)
Anion gap: 8 (ref 5–15)
BUN: 17 mg/dL (ref 6–20)
BUN: 16 mg/dL (ref 6–20)
CO2: 24 mmol/L (ref 22–32)
CO2: 23 mmol/L (ref 22–32)
Calcium: 9.3 mg/dL (ref 8.9–10.3)
Calcium: 8.7 mg/dL — ABNORMAL LOW (ref 8.9–10.3)
Chloride: 105 mmol/L (ref 98–111)
Chloride: 107 mmol/L (ref 98–111)
Creatinine, Ser: 1.01 mg/dL — ABNORMAL HIGH (ref 0.44–1.00)
Creatinine, Ser: 0.97 mg/dL (ref 0.44–1.00)
GFR, Estimated: >60 mL/min (ref >60)
GFR, Estimated: >60 mL/min (ref >60)
Glucose, Bld: 128 mg/dL — ABNORMAL HIGH (ref 70–99)
Glucose, Bld: 163 mg/dL — ABNORMAL HIGH (ref 70–99)
Potassium: 3.2 mmol/L — ABNORMAL LOW (ref 3.5–5.1)
Potassium: 4.2 mmol/L (ref 3.5–5.1)
Sodium: 139 mmol/L (ref 135–145)
Sodium: 138 mmol/L (ref 135–145)

## 2023-08-03 LAB — ECHOCARDIOGRAM COMPLETE
AR max vel: 2.48 cm2
AV Peak grad: 6.4 mmHg
Ao pk vel: 1.26 m/s
S' Lateral: 2.7 cm
Weight: 3291.03 [oz_av]

## 2023-08-03 LAB — CBC
HCT: 41.9 % (ref 36.0–46.0)
Hemoglobin: 14.2 g/dL (ref 12.0–15.0)
MCH: 27 pg (ref 26.0–34.0)
MCHC: 33.9 g/dL (ref 30.0–36.0)
MCV: 79.8 fL — ABNORMAL LOW (ref 80.0–100.0)
Platelets: 300 10*3/uL (ref 150–400)
RBC: 5.25 MIL/uL — ABNORMAL HIGH (ref 3.87–5.11)
RDW: 14.3 % (ref 11.5–15.5)
WBC: 5 10*3/uL (ref 4.0–10.5)
nRBC: 0 % (ref 0.0–0.2)

## 2023-08-03 LAB — PROTIME-INR
INR: 1 (ref 0.8–1.2)
Prothrombin Time: 13.2 s (ref 11.4–15.2)

## 2023-08-03 LAB — T4, FREE: Free T4: 0.93 ng/dL (ref 0.61–1.12)

## 2023-08-03 LAB — TROPONIN I (HIGH SENSITIVITY): Troponin I (High Sensitivity): 15 ng/L (ref <18)

## 2023-08-03 LAB — TSH: TSH: 2.309 u[IU]/mL (ref 0.350–4.500)

## 2023-08-03 LAB — MAGNESIUM
Magnesium: 2.1 mg/dL (ref 1.7–2.4)
Magnesium: 2 mg/dL (ref 1.7–2.4)

## 2023-08-03 MED ORDER — POTASSIUM CHLORIDE CRYS ER 20 MEQ PO TBCR: 40.0000 meq | EXTENDED_RELEASE_TABLET | Freq: Once | ORAL | Status: DC

## 2023-08-03 MED ORDER — RIVAROXABAN 15 MG PO TABS: 15.0000 mg | ORAL_TABLET | Freq: Once | ORAL | Status: DC

## 2023-08-03 MED ORDER — ACETAMINOPHEN 650 MG RE SUPP: 650.0000 mg | Freq: Four times a day (QID) | RECTAL | Status: DC | PRN

## 2023-08-03 MED ORDER — ACETAMINOPHEN 325 MG PO TABS: 650.0000 mg | ORAL_TABLET | Freq: Four times a day (QID) | ORAL | Status: DC | PRN

## 2023-08-03 MED ORDER — DILTIAZEM HCL-DEXTROSE 125-5 MG/125ML-% IV SOLN (PREMIX)
5.0000 mg/h | INTRAVENOUS | Status: DC
  Administered 2023-08-03: 5 mg/h via INTRAVENOUS
  Administered 2023-08-03: 10 mg/h via INTRAVENOUS
  Filled 2023-08-03 (×2): qty 125

## 2023-08-03 MED ORDER — METOPROLOL TARTRATE 25 MG PO TABS
25.0000 mg | ORAL_TABLET | Freq: Four times a day (QID) | ORAL | Status: DC
  Administered 2023-08-03: 25 mg via ORAL
  Filled 2023-08-03: qty 1

## 2023-08-03 MED ORDER — DILTIAZEM LOAD VIA INFUSION
20.0000 mg | Freq: Once | INTRAVENOUS | Status: AC
  Administered 2023-08-03: 10 mg via INTRAVENOUS
  Filled 2023-08-03: qty 20

## 2023-08-03 MED ORDER — POTASSIUM CHLORIDE CRYS ER 20 MEQ PO TBCR
40.0000 meq | EXTENDED_RELEASE_TABLET | Freq: Once | ORAL | Status: AC
  Administered 2023-08-03: 40 meq via ORAL
  Filled 2023-08-03: qty 2

## 2023-08-03 MED ORDER — SODIUM CHLORIDE 0.9 % IV BOLUS
1000.0000 mL | Freq: Once | INTRAVENOUS | Status: AC
  Administered 2023-08-03: 1000 mL via INTRAVENOUS

## 2023-08-03 MED ORDER — RIVAROXABAN 10 MG PO TABS
20.0000 mg | ORAL_TABLET | Freq: Once | ORAL | Status: AC
  Administered 2023-08-03: 20 mg via ORAL
  Filled 2023-08-03: qty 2

## 2023-08-03 MED ORDER — RIVAROXABAN 20 MG PO TABS: 20.0000 mg | ORAL_TABLET | Freq: Every day | ORAL | Status: DC

## 2023-08-03 NOTE — Discharge Instructions (Addendum)
 Dear [name],   Thank you so much for allowing [place] to be part of your care!  You were admitted to [facility] for heart palpitations and found to have Atrial Fibrillation. The Cardiology team helped to manage your care in the hospital. You were started on medications to help control your heart rate. Please continue to take these medications as prescribed.   POST-HOSPITAL & CARE INSTRUCTIONS See below for information on your Xarelto medication.  Please let PCP/Specialists know of any changes that were made.  Please see medications section of this packet for any medication changes.   DOCTOR'S APPOINTMENT & FOLLOW UP CARE INSTRUCTIONS  Future Appointments  Date Time Provider Department Center  09/01/2023 10:30 AM Doreene Eland, MD [facility]    RETURN PRECAUTIONS: Lambert Mody chest pain - Difficulty breathing  - Sudden weakness or confusion  Take care and be well!  Family Medicine Teaching Service  [place]  [facility]  [address] [place], [place] [address] [phone]    Information on my medicine - XARELTO (Rivaroxaban)  This medication education was reviewed with me or my healthcare representative as part of my discharge preparation.  The pharmacist that spoke with me during my hospital stay was:  Titus Mould, [facility]  Why was Xarelto prescribed for you? Xarelto was prescribed for you to reduce the risk of a blood clot forming that can cause a stroke if you have a medical condition called atrial fibrillation (a type of irregular heartbeat).  What do you need to know about xarelto ? Take your Xarelto ONCE DAILY at the same time every day with your evening meal. If you have difficulty swallowing the tablet whole, you may crush it and mix in applesauce just prior to taking your dose.  Take Xarelto exactly as prescribed by your doctor and DO NOT stop taking Xarelto without talking to the doctor who prescribed the medication.   Stopping without other stroke prevention medication to take the place of Xarelto may increase your risk of developing a clot that causes a stroke.  Refill your prescription before you run out.  After discharge, you should have regular check-up appointments with your healthcare provider that is prescribing your Xarelto.  In the future your dose may need to be changed if your kidney function or weight changes by a significant amount.  What do you do if you miss a dose? If you are taking Xarelto ONCE DAILY and you miss a dose, take it as soon as you remember on the same day then continue your regularly scheduled once daily regimen the next day. Do not take two doses of Xarelto at the same time or on the same day.   Important Safety Information A possible side effect of Xarelto is bleeding. You should call your healthcare provider right away if you experience any of the following: Bleeding from an injury or your nose that does not stop. Unusual colored urine (red or dark brown) or unusual colored stools (red or black). Unusual bruising for unknown reasons. A serious fall or if you hit your head (even if there is no bleeding).  Some medicines may interact with Xarelto and might increase your risk of bleeding while on Xarelto. To help avoid this, consult your healthcare provider or pharmacist prior to using any new prescription or non-prescription medications, including herbals, vitamins, non-steroidal anti-inflammatory drugs (NSAIDs) and supplements.  This website has more information on Xarelto: VisitDestination.com.br.

## 2023-08-03 NOTE — Hospital Course (Addendum)
 [name] is a 58 y.o.female with a history of HTN and T2DM who was admitted to the [facility] Medicine Teaching Service at [facility] for new onset Afib w RVR. Her hospital course is detailed below:  Afib RVR  Presented with palpitations found to have new onset Afib with RVR to 130-140s. Initial workup demonstrated normal CXR, Trop 15, TSH/T4 wnl. Cardiology was consulted. Started on Diltiazem drip with improvement of rates, subsequently stopped and transitioned to Metoprolol 25mg  BID. This was decreased to metoprolol 25mg  daily due to baseline sinus bradycardia. Started Xarelto 20mg  daily. Echo demonstrated EF 60 to 65% with mild LVH. Patient remained in rate-controlled NSR at time of discharge. Will have outpatient cardiology follow up.  HTN Patient reported good adherence with indapamide 2.5 mg daily and spironolactone 25 mg daily prior to admission.  These medications were held due to normotension.  Patient reports possible angioedema with ACEI/ARB, peripheral edema with amlodipine, and history of hypokalemia.  Mild LVH found on echo.  Recommend reevaluating blood pressure and restarting antihypertensives as appropriate.  Other chronic conditions were medically managed with home medications and formulary alternatives as necessary (none)  PCP Follow-up Recommendations: Outpatient sleep study due to possible OSA Restart anti-hypertensives as indicated Restart statin versus other lipid-lowering agent (patient reports myalgias on statin in the past) Follow-up hemoglobin A1c, initiate diabetic medications as needed

## 2023-08-03 NOTE — Plan of Care (Addendum)
 FMTS Interim Progress Note Patient seen at bedside with Dr Ardyth Harps.   S: Patient sleeping comfortably.   O: BP 111/63 (BP Location: Right Arm)   Pulse 84   Temp 98.3 F (36.8 C) (Oral)   Resp 16   Wt 93.3 kg   SpO2 99%   BMI 31.27 kg/m   General: Sleeping comfortably in room. Pulm: Breathing comfortably on room air. No increased WOB.  A/P: Afib RVR Observed patient consistently in sinus rhythm while in room. HR in mid-hi 40s.  - Diltiazem drip turned off around 2115 per nursing - consider resuming if rate poorly controlled - Hold metoprolol when brady - otherwise continue metoprolol 25 q6 - Continuous cardiac monitoring   Continue treatment plan as otherwise indicated in FMTS H&P.   Veronica Quale, MD 08/03/2023, 8:06 PM PGY-1, [facility] Family Medicine Service pager [phone]

## 2023-08-03 NOTE — H&P (Addendum)
 Hospital Admission History and Physical Service Pager: [phone]  Patient name: [name] Medical record number: [id number] Date of Birth: October 04, 1965 Age: 58 y.o. Gender: female  Primary Care Provider: Doreene Eland, MD Consultants: None Code Status: FULL Preferred Emergency Contact:  Christie Nottingham (Son) [phone] Lawernce Pitts (Sister) [phone]  Chief Complaint: Palpitations  Assessment and Plan: [name] is a 58 y.o. female presenting with palpitations found to have afib with RVR . Differential for this patient's presentation of this includes valvular/HTN induced afib, PE (well's 1.5 low risk), and MI (troponin 15, no CP).   Assessment & Plan New onset a-fib (HCC) New onset, rates up to 130s to 140s in room.  Thyroid testing normal.  CHA2DS2-VASc of 2 (sex and hypertension) was initiated on Xarelto and diltiazem drip in ED. -Admit to FM TS, progressive, attending Dr. Pollie Meyer -Cardiology consulted, appreciate recs -Continue diltiazem drip  -Continue Xarelto 20 mg daily (consider additional discussion about continuing anticoagulation given CHADsVASC score) -Keep K>4, Mag>2 -Echocardiogram -AM CBC, BMP, Mag  Essential hypertension, benign Home medications of indapamide 2.5 mg daily and spironolactone 25 mg daily.  Many intolerances to other antihypertensive medications per chart review. BP more normotensive on dilt drip. -Hold home medications while on drip  Chronic and Stable Conditions: Prediabetes-has had 1 A1c of 6.6 in November that was thought to be an error as repeat was 6.1.  Will obtain repeat A1c today.  FEN/GI: Heart healthy VTE Prophylaxis: Xarelto  Disposition: Progressive  History of Present Illness:  [name] is a 58 y.o. female presenting with palpitations that started yesterday night.  Yesterday she states that in chest felt like she had pulled a muscle when moving her arm earlier in day.  She works 2  jobs and she states after getting off her second job and coming home around 10 PM she started to feel more short of breath.  She felt like she needed to pass out.  Around 10 PM she took both of her blood pressure medications and a half of a naproxen and laid down.  When she laid down she felt like her heart was racing and was breathing heavily and felt like she was not coming down.  She took 2 baby aspirin's that night which helped and she went to sleep.  In the morning she woke up and got ready for her second job and got to work at 5:45 in the morning and still felt unwell.  They checked her blood pressure at work and stated that it was high and she continued to have palpitations during this time and was sent to the ER.  She states that she has been having intermittent palpitations for many years now.  She says that usually the palpitations happen at nighttime but has always thought that it was more related to her medications.  She has never had palpitations last for this long.  States that she previously saw cardiologist Dr. Eden Emms years ago and was diagnosed with"athletes heart."  Echocardiogram in 2014 with EF of 60 to 65% and mild aortic valve regurgitation.  Has never been diagnosed with atrial fibrillation before.  Denies any history of heart attacks, strokes, heart failure TIA, pulmonary embolism or DVT, or GI bleed.  In the ED, was given 1 L normal saline bolus, started on diltiazem drip, 40 meq K, and 20 mg Xarelto. CXR negative. Troponin 15, TSH, T4, Mag wnl. K 3.2.  Review Of Systems: Per HPI with the following additions:  Nausea (none currently) Constipation Chest tightness yesterday L ankle always swelling d/t surgeries Some orthopnea for years  Pertinent Past Medical History: HTN Prediabetes Brea Remainder reviewed in history tab.   Pertinent Past Surgical History: L Ankle surgerires(ankle fusion)  Lumpectomy (for breast hyoerplasia) Shoulder surgery Wrist surgeries Remainder  reviewed in history tab.   Pertinent Social History: Tobacco use: Never Alcohol use: Socially, weekend 1-2 drinks Other Substance use: None Lives with herself  Pertinent Family History: 2 brothers died in last 2-3 years of heart failure Mom died of heart attacks  Remainder reviewed in history tab.   Important Outpatient Medications: Indapamide 2.5 mg daily (yesterday) Naproxen 375 mg BID PRN Potassium 20 meq daily (did not take yesterday) Spironolactone 25 mg daily (yesterday) Remainder reviewed in medication history.   Objective: BP (!) 131/97   Pulse (!) 54   Temp 98.7 F (37.1 C)   Resp 16   SpO2 100%  Exam: General: NAD, alert and responsive all questions Eyes: Pupils equal ENTM: Moist mucous membranes Neck: No neck masses Cardiovascular: Irregularly irregular, tachycardic, no murmurs Respiratory: Clear to auscultation, no increased work of breathing on room air saturating at 100% Gastrointestinal: Soft, nontender to palpation MSK: No lower extremity edema or calf tenderness Derm: No skin rashes Neuro: Alert and no focal deficits Psych: Mood appropriate, pleasant  Labs:  CBC BMET  Recent Labs  Lab 08/03/23 0656  WBC 5.0  HGB 14.2  HCT 41.9  PLT 300   Recent Labs  Lab 08/03/23 0656  NA 139  K 3.2*  CL 105  CO2 24  BUN 17  CREATININE 1.01*  GLUCOSE 128*  CALCIUM 9.3    Pertinent additional labs Troponin 15, TSH, T4, Mag wnl. K 3.2..  EKG: afib RVR   Imaging Studies Performed:  CXR normal  Levin Erp, MD 08/03/2023, 10:20 AM PGY-3, [facility]  FPTS Intern pager: [phone], text pages welcome Secure chat group [facility] Teaching Service

## 2023-08-03 NOTE — ED Triage Notes (Signed)
 Patient reports palpitations with mild SOB onset last night .

## 2023-08-03 NOTE — TOC Benefit Eligibility Note (Signed)
 Pharmacy Patient Advocate Encounter  Insurance verification completed.    The patient is insured through [facility]. Patient has [employer], may use a copay card, and/or apply for patient assistance if available.    Ran test claim for Xarelto 20mg  and the current 30 day co-pay is $35.   This test claim was processed through [facility]- copay amounts may vary at other pharmacies due to [employer], or as the patient moves through the different stages of their insurance plan.

## 2023-08-03 NOTE — Plan of Care (Signed)
 Spoke with [employer] - consulting for new onset Afib, still elevated rates on dilt drip. They will consult.

## 2023-08-03 NOTE — Assessment & Plan Note (Signed)
 Home medications of indapamide 2.5 mg daily and spironolactone 25 mg daily.  Many intolerances to other antihypertensive medications per chart review. BP more normotensive on dilt drip. -Hold home medications while on drip

## 2023-08-03 NOTE — Consult Note (Addendum)
 Cardiology Consultation   Patient ID: [name] MRN: [id number]; DOB: 10-Apr-1966  Admit date: 08/03/2023 Date of Consult: 08/03/2023  PCP:  Doreene Eland, MD   [facility] Providers Cardiologist:  Charlton Haws, MD        Patient Profile:   [name] is a 58 y.o. female with a hx of HTN, hyperlipidemia, mild aortic regurgitation, GERD who is being seen 08/03/2023 for the evaluation of Atrial fibrillation with RVR at the request of [place] Mclntyre.  History of Present Illness:   [name] is a 58 year old female with prior cardiac history shown below.   Had palpitations so had an TTE in 2014 showed an LVEF of 60 to 65% and mild aortic regurgitation, Holter in 2014 showed pac's and no concerning arrhythmias. Had a Myoview in 2017 for shortness of breath that was normal.  Was seen by Dr. Eden Emms in 2022 for Dizziness and no further follow-up was recommended.  She presented to the emergency department on 08/03/23 for palpitations and shortness of breath that started last night EKG was obtained and showed A-fib with RVR rate of 150.  Abs in the emergency department showed potassium of 3.2, magnesium of 2.1, and creatinine of 1.01.  TSH was normal at 2.3 and T4 was 0.93.  Chest x-ray showed no active disease.  Was started on IV Cardizem 5 mg/hr potassium was replenished, was started on Xarelto 20 mg. Most recent blood pressure was 118/62 and pulse was 138.  On patient interview patient reported this was the first time she was told she has atrial fibrillation.  Had a chest tightness and shortness of breath that started yesterday and palpitations that started last night.  Denies having chest pain, lower extremity edema, diaphoresis. denies having any prior cardiac history including coronary artery disease or heart failure.  Denies tobacco use and illicit substance use.  Drinks 1-2 alcoholic drinks on weekends.  Snores a lot when she sleeps. Was previously on a  statin and had muscle pains.  Mother had a heart attack and 2 brothers had heart failure.  Past Medical History:  Diagnosis Date   Allergy    Arthritis    Lt ankle   Chronic kidney disease    right renal cyst    GERD (gastroesophageal reflux disease)    hx of no problems at present    Headache(784.0)    occasional headache    Hypertension    Influenza 04/20/2012   PONV (postoperative nausea and vomiting)    N/V and trouble urinating after surgery     Past Surgical History:  Procedure Laterality Date   BREAST EXCISIONAL BIOPSY Left    BREAST LUMPECTOMY     BREAST SURGERY  2010   lumpectomy Lt breast   CARPAL TUNNEL RELEASE Right 08/06/2015   Procedure: RIGHT CARPAL TUNNEL RELEASE;  Surgeon: Betha Loa, MD;  Location: [facility];  Service: Orthopedics;  Laterality: Right;   DORSAL COMPARTMENT RELEASE Right 08/06/2015   Procedure: RIGHT FIRST RELEASE DORSAL COMPARTMENT (DEQUERVAIN);  Surgeon: Betha Loa, MD;  Location: [facility];  Service: Orthopedics;  Laterality: Right;   ENDOMETRIAL ABLATION  10/18/2010   at [facility]   FRACTURE SURGERY  2011   Lt ankle surgeries x 3    GANGLION CYST EXCISION Right 07/02/2021   Procedure: RIGHT WRIST EXCISION DORSAL GANGLION;  Surgeon: Betha Loa, MD;  Location: [facility];  Service: Orthopedics;  Laterality: Right;   GANGLION CYST EXCISION Right 03/10/2022  Procedure: RIGHT WRIST EXCISION RECURRENT DORSAL GANGLION;  Surgeon: Betha Loa, MD;  Location: [facility];  Service: Orthopedics;  Laterality: Right;  45 MIN   JOINT REPLACEMENT     left ankle 8/12    LAPAROSCOPIC NEPHRECTOMY  03/09/2011   Procedure: LAPAROSCOPIC NEPHRECTOMY;  Surgeon: Marcine Matar;  Location: [facility];  Service: Urology;  Laterality: Right;  Laparoscopic Assisted Removal Of Renal Cyst    OTHER SURGICAL HISTORY     2 hand surgeries on left and shoulder surgery on right shoulder    TOTAL ANKLE  ARTHROPLASTY Left 07/05/2012   Procedure: Removal of total ankle implants,I & D with insertion of  new poly spacer;  Surgeon: Toni Arthurs, MD;  Location: [facility];  Service: Orthopedics;  Laterality: Left;   TUBAL LIGATION  1988       Inpatient Medications: Scheduled Meds:  [START ON 08/04/2023] rivaroxaban  20 mg Oral Q supper   Continuous Infusions:  diltiazem (CARDIZEM) infusion 15 mg/hr (08/03/23 1451)   PRN Meds: acetaminophen **OR** acetaminophen  Allergies:    Allergies  Allergen Reactions   Tetracycline Hcl Shortness Of Breath, Swelling and Other (See Comments)    Large purple bruise   Angiotensin Receptor Blockers Swelling    Had episode of angioedema on Losartan    Nickel Dermatitis and Itching   Septra [Bactrim] Itching, Swelling and Other (See Comments)    Purple bruise   Sulfamethoxazole-Trimethoprim Itching, Swelling and Other (See Comments)    Purple bruise   Amlodipine Other (See Comments)    Leg edema and bradycardia per documentation   Crestor [Rosuvastatin Calcium]     Muscle pains    Social History:   Social History   Socioeconomic History   Marital status: Divorced    Spouse name: Not on file   Number of children: 2   Years of education: Not on file   Highest education level: GED or equivalent  Occupational History   Not on file  Tobacco Use   Smoking status: Never    Passive exposure: Never   Smokeless tobacco: Never  Vaping Use   Vaping status: Never Used  Substance and Sexual Activity   Alcohol use: No    Alcohol/week: 0.0 standard drinks of alcohol   Drug use: No   Sexual activity: Not Currently  Other Topics Concern   Not on file  Social History Narrative   Not on file   Social Drivers of Health   Financial Resource Strain: Low Risk  (06/01/2023)   Overall Financial Resource Strain (CARDIA)    Difficulty of Paying Living Expenses: Not hard at all  Food Insecurity: Food Insecurity Present (08/03/2023)   Hunger Vital Sign    Worried  About Running Out of Food in the Last Year: Sometimes true    Ran Out of Food in the Last Year: Never true  Transportation Needs: No Transportation Needs (08/03/2023)   PRAPARE - Administrator, Civil Service (Medical): No    Lack of Transportation (Non-Medical): No  Physical Activity: Sufficiently Active (06/01/2023)   Exercise Vital Sign    Days of Exercise per Week: 7 days    Minutes of Exercise per Session: 110 min  Stress: No Stress Concern Present (06/01/2023)   [employer] of Occupational Health - Occupational Stress Questionnaire    Feeling of Stress : Not at all  Social Connections: Socially Isolated (06/01/2023)   Social Connection and Isolation Panel [NHANES]    Frequency of Communication with Friends and  Family: Once a week    Frequency of Social Gatherings with Friends and Family: Once a week    Attends Religious Services: 1 to 4 times per year    Active Member of [employer] or Organizations: No    Attends Engineer, structural: Not on file    Marital Status: Divorced  Intimate Partner Violence: Not At Risk (08/03/2023)   Humiliation, Afraid, Rape, and Kick questionnaire    Fear of Current or Ex-Partner: No    Emotionally Abused: No    Physically Abused: No    Sexually Abused: No    Family History:    Family History  Problem Relation Age of Onset   Diabetes Mother    Heart disease Mother    Hypertension Father    Diabetes Sister    Kidney disease Sister    Hypertension Brother    Kidney disease Brother    Arthritis Brother    Cancer Maternal Grandmother    Diabetes Sister    Hypertension Sister    Hypertension Sister    Hypertension Sister    Hypertension Brother    Mental illness Brother    Breast cancer Niece    Colon cancer Neg Hx      ROS:  Please see the history of present illness.   All other ROS reviewed and negative.     Physical Exam/Data:   Vitals:   08/03/23 1047 08/03/23 1200 08/03/23 1415 08/03/23 1428  BP: 118/62  128/67 106/69   Pulse: (!) 138 (!) 147    Resp: 20 (!) 21 16   Temp: 98.2 F (36.8 C)   98.7 F (37.1 C)  TempSrc: Oral     SpO2: 100% 100%  100%   No intake or output data in the 24 hours ending 08/03/23 1452    07/02/2023    2:11 PM 06/02/2023    9:28 AM 03/28/2023    8:34 AM  Last 3 Weights  Weight (lbs) 206 lb 209 lb 3.2 oz 211 lb  Weight (kg) 93.441 kg 94.892 kg 95.709 kg     There is no height or weight on file to calculate BMI.  General:  Well nourished, well developed, in no acute distress on room air HEENT: normal Neck: no JVD Vascular: Distal pulses 2+ bilaterally Cardiac:  tachycardia and irregularly irregular rhythm Lungs:  clear to auscultation bilaterally, no wheezing, rhonchi or rales  Ext: 1+ edema Musculoskeletal:  No deformities Neuro:  CNs 2-12 intact, no focal abnormalities noted Psych:  Normal affect   EKG:  The EKG was personally reviewed and demonstrates:  Atrial fibrillation with RVR and a rate of 150. Telemetry:  Telemetry was personally reviewed and demonstrates: Atrial fibrillation with RVR and rates currently in 110's to 130's  Relevant CV Studies: Echo pending  Laboratory Data:  High Sensitivity Troponin:   Recent Labs  Lab 08/03/23 0656  TROPONINIHS 15     Chemistry Recent Labs  Lab 08/03/23 0656 08/03/23 0808  NA 139  --   K 3.2*  --   CL 105  --   CO2 24  --   GLUCOSE 128*  --   BUN 17  --   CREATININE 1.01*  --   CALCIUM 9.3  --   MG  --  2.1  GFRNONAA >60  --   ANIONGAP 10  --     No results for input(s): "PROT", "ALBUMIN", "AST", "ALT", "ALKPHOS", "BILITOT" in the last 168 hours. Lipids No results for input(s): "CHOL", "TRIG", "  HDL", "LABVLDL", "LDLCALC", "CHOLHDL" in the last 168 hours.  Hematology Recent Labs  Lab 08/03/23 0656  WBC 5.0  RBC 5.25*  HGB 14.2  HCT 41.9  MCV 79.8*  MCH 27.0  MCHC 33.9  RDW 14.3  PLT 300   Thyroid  Recent Labs  Lab 08/03/23 0808  TSH 2.309  FREET4 0.93    BNPNo results  for input(s): "BNP", "PROBNP" in the last 168 hours.  DDimer No results for input(s): "DDIMER" in the last 168 hours.   Radiology/Studies:  [facility] Chest Port 1 View Result Date: 08/03/2023 CLINICAL DATA:  10009 Palpitations 10009 EXAM: PORTABLE CHEST 1 VIEW COMPARISON:  07/26/2016. FINDINGS: Bilateral lung fields are clear. Bilateral costophrenic angles are clear. Normal cardio-mediastinal silhouette. No acute osseous abnormalities. The soft tissues are within normal limits. IMPRESSION: No active disease. Electronically Signed   By: Jules Schick M.D.   On: 08/03/2023 08:57     Assessment and Plan:   [name] is a 58 y.o. female with a hx of HTN, hyperlipidemia, aortic regurgitation, GERD who is being seen 08/03/2023 for the evaluation of Atrial fibrillation with RVR at the request of [place] Mclntyre.  New onset atrial fibrillation with RVR CHA2DS2-VASc Score = 3 [CHF History: 0, HTN History: 1, Diabetes History: 1, Stroke History: 0, Vascular Disease History: 0, Age Score: 0, Gender Score: 1].  Therefore, the patient's annual risk of stroke is 3.2 %.     Shortness of breath and the chest tightness started yesterday palpitations started last night and was unable to sleep EKG in the ER showed atrial fibrillation with RVR.  Was started on IV Cardizem 5 mg/h.  Started on Xarelto 20 mg.  rate continuing to be elevated in the 110s to 130s.  Blood pressure has declined and is currently 109/90. --TSH normal -- Reported snorring a lot when she sleeps to follow-up with outpatient sleep study. -- Continue Xarelto 20 mg daily. -- continue Cardizem 5mg /hr with a goal heart rate less than 110  -- start metoprolol tartrate 25mg  every 6 hours. -- Echo pending   Hypertension -- PTA on Lozol 2.5mg  daily, sprinalactone 25mg  daily -- hold Lozol 2.5mg  daily, and sprinalactone 25mg  daily.  Hyperlipidemia -- Previously on Crestor but reported having muscle aches -- Refer to lipid clinic  Risk  Assessment/Risk Scores:          CHA2DS2-VASc Score = 3  This indicates a 3.2% annual risk of stroke. The patient's score is based upon: CHF History: 0 HTN History: 1 Diabetes History: 1 Stroke History: 0 Vascular Disease History: 0 Age Score: 0 Gender Score: 1     For questions or updates, please contact [facility] Please consult [email or web address] for contact info under    Signed, Arabella Merles, PA-C  08/03/2023 2:52 PM

## 2023-08-03 NOTE — TOC CM/SW Note (Signed)
 Transition of Care [facility]) - Inpatient Brief Assessment   Patient Details  Name: [name] MRN: [id number] Date of Birth: November 23, 1965  [facility]) CM/SW Contact:    Leone Haven, RN Phone Number: 08/03/2023, 4:21 PM   Clinical Narrative: From home alone, has PCP and insurance on file, states has no HH services in place at this time or DME at home.  States family member will transport them home at [employer] and family is support system, states gets medications from [facility] on [address].  Pta self ambulatory.    Transition of Care Asessment: Insurance and Status: Insurance coverage has been reviewed Patient has primary care physician: Yes Home environment has been reviewed: lives alone Prior level of function:: indep Prior/Current Home Services: No current home services Social Drivers of Health Review: SDOH reviewed no interventions necessary Readmission risk has been reviewed: Yes Transition of care needs: no transition of care needs at this time

## 2023-08-03 NOTE — ED Provider Notes (Signed)
 [place] EMERGENCY DEPARTMENT AT [facility] Provider Note   CSN: [id number] Arrival date & time: 08/03/23  6440     History  Chief Complaint  Patient presents with   Palpitations    [name] is a 58 y.o. female with past medical history significant for CKD, hypertension, GERD, arthritis who presents with concerns for palpitation, mild shortness of breath onset last night.  Patient reports that she was feeling some brief feeling of weakness and possible palpitations earlier yesterday but symptoms resolved.  She went to bed and when she woke up in the middle of the night around 12 AM she was having significant palpitations and mild shortness of breath.  She denies any chest pain.  No history of alcohol use, no history of thyroid disease, no previous history of diagnosed abnormal heart rhythm.  No history of ACS, heart failure.  She reports that she was having some palpitations around 5 years ago and was seen by cardiologist with echo and other evaluation, had a unremarkable workup, palpitations resolved spontaneously and she was dismissed from the cardiologist at that time.  Takes spironolactone and indapamide daily.  HPI     Home Medications Prior to Admission medications   Medication Sig Start Date End Date Taking? Authorizing Provider  indapamide (LOZOL) 2.5 MG tablet Take 1 tablet (2.5 mg total) by mouth daily. 03/06/23   Carney Living, MD  naproxen (NAPROSYN) 375 MG tablet Take 1 tablet (375 mg total) by mouth 2 (two) times daily as needed. 07/04/23   Elmer Picker, NP  potassium chloride 20 MEQ/15ML (10%) SOLN Take 15 mLs (20 mEq total) by mouth daily. 07/31/23   Doreene Eland, MD  spironolactone (ALDACTONE) 25 MG tablet TAKE 1 TABLET BY MOUTH EVERY DAY 03/06/23   Carney Living, MD      Allergies    Tetracycline hcl, Angiotensin receptor blockers, Nickel, Septra [bactrim], Sulfamethoxazole-trimethoprim, and Amlodipine    Review of Systems    Review of Systems  All other systems reviewed and are negative.   Physical Exam Updated Vital Signs BP (!) 131/97   Pulse (!) 54   Temp 98.7 F (37.1 C)   Resp 16   SpO2 100%  Physical Exam Vitals and nursing note reviewed.  Constitutional:      General: She is not in acute distress.    Appearance: Normal appearance.  HENT:     Head: Normocephalic and atraumatic.  Eyes:     General:        Right eye: No discharge.        Left eye: No discharge.  Cardiovascular:     Rate and Rhythm: Tachycardia present. Rhythm irregular.     Heart sounds: No murmur heard.    No friction rub. No gallop.  Pulmonary:     Effort: Pulmonary effort is normal.     Breath sounds: Normal breath sounds.  Abdominal:     General: Bowel sounds are normal.     Palpations: Abdomen is soft.  Skin:    General: Skin is warm and dry.     Capillary Refill: Capillary refill takes less than 2 seconds.  Neurological:     Mental Status: She is alert and oriented to person, place, and time.  Psychiatric:        Mood and Affect: Mood normal.        Behavior: Behavior normal.     ED Results / Procedures / Treatments   Labs (all labs ordered are  listed, but only abnormal results are displayed) Labs Reviewed  BASIC METABOLIC PANEL WITH GFR - Abnormal; Notable for the following components:      Result Value   Potassium 3.2 (*)    Glucose, Bld 128 (*)    Creatinine, Ser 1.01 (*)    All other components within normal limits  CBC - Abnormal; Notable for the following components:   RBC 5.25 (*)    MCV 79.8 (*)    All other components within normal limits  APTT - Abnormal; Notable for the following components:   aPTT 23 (*)    All other components within normal limits  MAGNESIUM  TSH  T4, FREE  PROTIME-INR  TROPONIN I (HIGH SENSITIVITY)    EKG EKG Interpretation Date/Time:  Thursday August 03 2023 06:53:43 EDT Ventricular Rate:  150 PR Interval:    QRS Duration:  80 QT Interval:  296 QTC  Calculation: 467 R Axis:   57  Text Interpretation: Atrial fibrillation with rapid ventricular response Nonspecific ST abnormality Abnormal QRS-T angle, consider primary T wave abnormality Abnormal ECG When compared with ECG of 28-Jun-2021 15:07, PREVIOUS ECG IS PRESENT Confirmed by Alona Bene [phone]) on 08/03/2023 7:11:30 AM  Radiology DG Chest Port 1 View Result Date: 08/03/2023 CLINICAL DATA:  10009 Palpitations 10009 EXAM: PORTABLE CHEST 1 VIEW COMPARISON:  07/26/2016. FINDINGS: Bilateral lung fields are clear. Bilateral costophrenic angles are clear. Normal cardio-mediastinal silhouette. No acute osseous abnormalities. The soft tissues are within normal limits. IMPRESSION: No active disease. Electronically Signed   By: Jules Schick M.D.   On: 08/03/2023 08:57    Procedures Procedures    Medications Ordered in ED Medications  diltiazem (CARDIZEM) 1 mg/mL load via infusion 20 mg (10 mg Intravenous Bolus from Bag 08/03/23 0741)    And  diltiazem (CARDIZEM) 125 mg in dextrose 5% 125 mL (1 mg/mL) infusion (5 mg/hr Intravenous New Bag/Given 08/03/23 0741)  sodium chloride 0.9 % bolus 1,000 mL (1,000 mLs Intravenous New Bag/Given 08/03/23 0740)  potassium chloride SA (KLOR-CON M) CR tablet 40 mEq (40 mEq Oral Given 08/03/23 0825)  rivaroxaban (XARELTO) tablet 20 mg (20 mg Oral Given 08/03/23 0825)    ED Course/ Medical Decision Making/ A&P                                 Medical Decision Making Amount and/or Complexity of Data Reviewed Labs: ordered. Radiology: ordered.  Risk Prescription drug management.   This patient is a 59 y.o. female  who presents to the ED for concern of palpitations.   Differential diagnoses prior to evaluation: The emergent differential diagnosis includes, but is not limited to,  The differential diagnosis for palpitations includes cardiac arrhythmias, PVC/PAC, ACS, Cardiomyopathy, CHF, MVP, pericarditis, valvular disease, Panic/Anxiety, Somatic disorder, ETOH,  Caffeine,  Stimulant use, medication side effect, Anemia, Hyperthyroidism, pulmonary embolism. . This is not an exhaustive differential.   Past Medical History / Co-morbidities / Social History: CKD, hypertension, GERD, arthritis  Additional history: Chart reviewed. Pertinent results include: Reviewed lab work, imaging from previous emergency department visits, reviewed remote echo from 2014 with normal left ventricular ejection fraction, no other significant abnormalities  Physical Exam: Physical exam performed. The pertinent findings include: this is an overall well-appearing distress, other vital signs in the hypertension on arrival, blood pressure 157/94, their heart rate is tachycardic and irregular on arrival, consistent with afib on EKG  Lab Tests/Imaging studies: I personally interpreted labs/imaging  and the pertinent results include: CBC unremarkable, notably with no leukocytosis, no significant anemia.  No platelet abnormality.  BMP with mild hypokalemia, potassium 3.2, we will orally replete.  Initial troponin is 15 in context of no active chest pain, and accelerated heart rate,. I agree with the radiologist interpretation.  Cardiac monitoring: EKG obtained and interpreted by myself and attending physician which shows: afib, rvr   Medications: I ordered medication including bolus, xarelto, potassium, cardizem bolus and infusion.  Given her history being unclear for time of onset I do not think that she is a good candidate for cardioversion prior to echo.  I have reviewed the patients home medicines and have made adjustments as needed.   Consults: I spoke with the family medicine physician and after discussion of her physical exam findings, response to treatment agrees to admission for new onset A-fib, RVR for rhythm, rate control, echo and further workup.  Disposition: After consideration of the diagnostic results and the patients response to treatment, I feel that patient would  benefit from admission as discussed above..   Final Clinical Impression(s) / ED Diagnoses Final diagnoses:  None    Rx / DC Orders ED Discharge Orders     None         Olene Floss, PA-C 08/03/23 1018    Maia Plan, MD 08/07/23 [phone]

## 2023-08-03 NOTE — Assessment & Plan Note (Addendum)
 New onset, rates up to 130s to 140s in room.  Thyroid testing normal.  CHA2DS2-VASc of 2 (sex and hypertension) was initiated on Xarelto and diltiazem drip in ED. -Admit to FM TS, progressive, attending Dr. Pollie Meyer -Cardiology consulted, appreciate recs -Continue diltiazem drip  -Continue Xarelto 20 mg daily (consider additional discussion about continuing anticoagulation given CHADsVASC score) -Keep K>4, Mag>2 -Echocardiogram -AM CBC, BMP, Mag

## 2023-08-04 ENCOUNTER — Other Ambulatory Visit (HOSPITAL_COMMUNITY): Payer: Self-pay

## 2023-08-04 DIAGNOSIS — I10 Essential (primary) hypertension: Secondary | ICD-10-CM | POA: Diagnosis not present

## 2023-08-04 DIAGNOSIS — I4891 Unspecified atrial fibrillation: Secondary | ICD-10-CM | POA: Diagnosis not present

## 2023-08-04 DIAGNOSIS — E119 Type 2 diabetes mellitus without complications: Secondary | ICD-10-CM | POA: Diagnosis not present

## 2023-08-04 LAB — BASIC METABOLIC PANEL WITH GFR
Anion gap: 9 (ref 5–15)
BUN: 18 mg/dL (ref 6–20)
CO2: 22 mmol/L (ref 22–32)
Calcium: 8.8 mg/dL — ABNORMAL LOW (ref 8.9–10.3)
Chloride: 108 mmol/L (ref 98–111)
Creatinine, Ser: 0.91 mg/dL (ref 0.44–1.00)
GFR, Estimated: >60 mL/min (ref >60)
Glucose, Bld: 130 mg/dL — ABNORMAL HIGH (ref 70–99)
Potassium: 4 mmol/L (ref 3.5–5.1)
Sodium: 139 mmol/L (ref 135–145)

## 2023-08-04 LAB — HEMOGLOBIN A1C
Hgb A1c MFr Bld: 6.2 % — ABNORMAL HIGH (ref 4.8–5.6)
Mean Plasma Glucose: 131.24 mg/dL

## 2023-08-04 LAB — MAGNESIUM: Magnesium: 2.2 mg/dL (ref 1.7–2.4)

## 2023-08-04 LAB — CBC
HCT: 35.9 % — ABNORMAL LOW (ref 36.0–46.0)
Hemoglobin: 12.4 g/dL (ref 12.0–15.0)
MCH: 27 pg (ref 26.0–34.0)
MCHC: 34.5 g/dL (ref 30.0–36.0)
MCV: 78.2 fL — ABNORMAL LOW (ref 80.0–100.0)
Platelets: 259 10*3/uL (ref 150–400)
RBC: 4.59 MIL/uL (ref 3.87–5.11)
RDW: 14.5 % (ref 11.5–15.5)
WBC: 5.4 10*3/uL (ref 4.0–10.5)
nRBC: 0 % (ref 0.0–0.2)

## 2023-08-04 MED ORDER — METOPROLOL SUCCINATE ER 25 MG PO TB24
25.0000 mg | ORAL_TABLET | Freq: Every day | ORAL | 0 refills | Status: DC
  Filled 2023-08-04: qty 30, 30d supply, fill #0

## 2023-08-04 MED ORDER — ALUM & MAG HYDROXIDE-SIMETH 200-200-20 MG/5ML PO SUSP
30.0000 mL | ORAL | Status: DC | PRN
  Administered 2023-08-04: 30 mL via ORAL
  Filled 2023-08-04: qty 30

## 2023-08-04 MED ORDER — RIVAROXABAN 20 MG PO TABS
20.0000 mg | ORAL_TABLET | Freq: Every day | ORAL | 0 refills | Status: DC
  Filled 2023-08-04: qty 30, 30d supply, fill #0

## 2023-08-04 MED ORDER — METOPROLOL SUCCINATE ER 25 MG PO TB24
25.0000 mg | ORAL_TABLET | Freq: Every day | ORAL | Status: DC
  Administered 2023-08-04: 25 mg via ORAL
  Filled 2023-08-04: qty 1

## 2023-08-04 NOTE — Progress Notes (Signed)
 Reviewed AVS, patient expressed understanding of medications, MD follow up reviewed.   Removed IV, Site clean, dry and intact.  Patient states all belongings brought to the hospital at time of admission are accounted for and packed to take home.  Picked up medications from [facility] pharmacy. Pt transported to Discharge lounge to wait for transportation home.

## 2023-08-04 NOTE — Assessment & Plan Note (Signed)
 Last A1c 6.6 in 11/24, not started on diabetes medications at that time -Follow-up A1c, start treatment as indicated

## 2023-08-04 NOTE — Discharge Summary (Addendum)
 Family Medicine Teaching [facility] Discharge Summary  Patient name: [name] Medical record number: [id number] Date of birth: 15-Jan-1966 Age: 58 y.o. Gender: female Date of Admission: 08/03/2023  Date of Discharge: 08/04/23 Admitting Physician: Levin Erp, MD  Primary Care Provider: Doreene Eland, MD Consultants: cardiology  Indication for Hospitalization: new A fib  Discharge Diagnoses/Problem List:  Principal Problem for Admission: new A fib Other Problems addressed during stay:  Principal Problem:   New onset a-fib (HCC) Active Problems:   Essential hypertension, benign   Type 2 diabetes mellitus without complication, without long-term current use of insulin [facility])    Brief Hospital Course:  [name] is a 58 y.o.female with a history of HTN and T2DM who was admitted to the [facility] Medicine Teaching Service at [facility] for new onset Afib w RVR. Her hospital course is detailed below:  Afib RVR  Presented with palpitations found to have new onset Afib with RVR to 130-140s. Initial workup demonstrated normal CXR, Trop 15, TSH/T4 wnl. Cardiology was consulted. Started on Diltiazem drip with improvement of rates, subsequently stopped and transitioned to Metoprolol 25mg  BID. This was decreased to metoprolol 25mg  daily due to baseline sinus bradycardia. Started Xarelto 20mg  daily. Echo demonstrated EF 60 to 65% with mild LVH. Patient remained in rate-controlled NSR at time of discharge. Will have outpatient cardiology follow up.  HTN  Patient reported good adherence with indapamide 2.5 mg daily and spironolactone 25 mg daily prior to admission.  These medications were held due to normotension.  Patient reports possible angioedema with ACEI/ARB, peripheral edema with amlodipine, and history of hypokalemia.  Mild LVH found on echo.  Recommend reevaluating blood pressure and restarting antihypertensives as appropriate.  Other chronic conditions were  medically managed with home medications and formulary alternatives as necessary (none)  PCP Follow-up Recommendations: Outpatient sleep study due to possible OSA Restart anti-hypertensives as indicated Restart statin versus other lipid-lowering agent (patient reports myalgias on statin in the past) Follow-up hemoglobin A1c, initiate diabetic medications as needed   Disposition: Home  Discharge Condition: Stable  Discharge Exam:  Vitals:   08/04/23 1115 08/04/23 1119  BP: (!) 149/71 (!) 149/71  Pulse: (!) 48 (!) 48  Resp: 16   Temp: 98.3 F (36.8 C)   SpO2: 99%    General: Sitting in chair, pleasant and conversant, in no acute distress CV: Bradycardic, regular rhythm, normal S1/S2, no murmurs, rubs, gallops Pulm: CTAB, normal work of breathing on room air, no wheezes, rales, rhonchi Extremities: No edema to bilateral lower extremities  Significant Procedures: Echo 4/3-EF 60 to 65%, no valvular abnormalities, mild LVH  Significant Labs and Imaging:  Recent Labs  Lab 08/03/23 0656 08/04/23 0350  WBC 5.0 5.4  HGB 14.2 12.4  HCT 41.9 35.9*  PLT 300 259   Recent Labs  Lab 08/03/23 0656 08/03/23 0808 08/03/23 2151 08/04/23 0350  NA 139  --  138 139  K 3.2*  --  4.2 4.0  CL 105  --  107 108  CO2 24  --  23 22  GLUCOSE 128*  --  163* 130*  BUN 17  --  16 18  CREATININE 1.01*  --  0.97 0.91  CALCIUM 9.3  --  8.7* 8.8*  MG  --  2.1 2.0 2.2   CXR 4/3: No acute cardiopulmonary abnormalities  Results/Tests Pending at Time of Discharge: Hemoglobin A1c  Discharge Medications:  Allergies as of 08/04/2023       Reactions  Tetracycline Hcl Shortness Of Breath, Swelling, Other (See Comments)   Large purple bruise   Angiotensin Receptor Blockers Swelling   Had episode of angioedema on Losartan    Nickel Dermatitis, Itching   Sulfamethoxazole-trimethoprim Itching, Swelling, Other (See Comments)   Purple bruise   Amlodipine Other (See Comments)   Leg edema and  bradycardia per documentation   Crestor [rosuvastatin Calcium]    Muscle pains        Medication List     PAUSE taking these medications    indapamide 2.5 MG tablet Wait to take this until your doctor or other care provider tells you to start again. Commonly known as: LOZOL Take 1 tablet (2.5 mg total) by mouth daily.   potassium chloride 20 MEQ/15ML (10%) Soln Wait to take this until your doctor or other care provider tells you to start again. Take 15 mLs (20 mEq total) by mouth daily.   spironolactone 25 MG tablet Wait to take this until your doctor or other care provider tells you to start again. Commonly known as: ALDACTONE TAKE 1 TABLET BY MOUTH EVERY DAY       STOP taking these medications    naproxen 375 MG tablet Commonly known as: NAPROSYN       TAKE these medications    acetaminophen 650 MG CR tablet Commonly known as: TYLENOL Take 650 mg by mouth daily as needed for pain.   metoprolol succinate 25 MG 24 hr tablet Commonly known as: TOPROL-XL Take 1 tablet (25 mg total) by mouth daily. Start taking on: August 05, 2023   Xarelto 20 MG Tabs tablet Generic drug: rivaroxaban Take 1 tablet (20 mg total) by mouth daily with supper.        Discharge Instructions: Please refer to Patient Instructions section of EMR for full details.  Patient was counseled important signs and symptoms that should prompt return to medical care, changes in medications, dietary instructions, activity restrictions, and follow up appointments.   Follow-Up Appointments:  Follow-up Information     Alver Sorrow, NP Follow up.   Specialty: Cardiology Why: Humberto Seals - [facility] location at [facility] off [address] - Tuesday Aug 15, 2023 at 10:55 AM. Wilmon Arms 15 minutes prior to appointment to check in. Contact information: [address] [place] [address] [phone]                Future Appointments  Date Time Provider  Department Center  08/09/2023 10:10 AM Tiffany Kocher, DO [facility]  08/15/2023 10:55 AM Alver Sorrow, NP [facility]  09/01/2023 10:30 AM Doreene Eland, MD [facility]     Lorayne Bender, MD 08/04/2023, 3:32 PM PGY-1, [facility] Family [facility]  I have verified that the resident's  findings are accurately documented in the resident's note. I have made edits and changes where appropriate, and agree with plan.  Celine Mans, MD, PGY-2 [facility] Family Medicine 3:32 PM 08/04/2023

## 2023-08-04 NOTE — Plan of Care (Signed)
   Problem: Education: Goal: Knowledge of General Education information will improve Description Including pain rating scale, medication(s)/side effects and non-pharmacologic comfort measures Outcome: Progressing   Problem: Health Behavior/Discharge Planning: Goal: Ability to manage health-related needs will improve Outcome: Progressing

## 2023-08-04 NOTE — Assessment & Plan Note (Signed)
 Presented in A-fib with RVR, this is the first episode.  Converted to sinus rhythm with diltiazem drip, this was discontinued overnight due to heart rates in the mid to high 40s.  Appears patient has a history of sinus bradycardia. Echo showed EF 60 to 55%, no valvular abnormalities, mild LVH.  Reports adherence with anti-hypertensives.  Not currently taking statin due to reports of myalgias.  Thyroid testing normal.  History of diabetic range A1c (see below). Patient reports heavy snoring, has not had sleep study in the past.  CHA2DS2-VASc score 3 for gender/hypertension/diabetes, started on Xarelto per cardiology.  -Holding metoprolol 25 mg due to bradycardia -Cardiology following, appreciate recs -Continue Xarelto 20 mg daily, plan for follow-up in A-fib clinic for potential cardioversion if still in A-fib and stable on anticoagulation for >3 weeks versus TEE-CV --Continuous cardiac monitoring --Outpatient sleep study to eval for OSA --AM CBC, BMP, mag

## 2023-08-04 NOTE — Progress Notes (Signed)
   Rounding Note    Patient Name: [name] Date of Encounter: 08/04/2023  [facility] Cardiologist: Charlton Haws, MD   Subjective   Converted to sinus around 9 PM last night. Has been sinus brady in the 50s, but feels better than she did before. We reviewed her meds, see below.  Inpatient Medications    Scheduled Meds:  metoprolol succinate  25 mg Oral Daily   rivaroxaban  20 mg Oral Q supper   Continuous Infusions:   PRN Meds: acetaminophen **OR** acetaminophen, alum & mag hydroxide-simeth   Vital Signs    Vitals:   08/03/23 2001 08/04/23 0004 08/04/23 0423 08/04/23 0737  BP: 111/63 (!) 107/47 (!) 124/37 123/62  Pulse: 84 (!) 47 (!) 46 (!) 50  Resp: 16 18 17 18   Temp: 98.3 F (36.8 C) 98 F (36.7 C) 98 F (36.7 C) 98.2 F (36.8 C)  TempSrc: Oral Oral Oral Oral  SpO2: 99% 100% 98% 99%  Weight:   93.8 kg     Intake/Output Summary (Last 24 hours) at 08/04/2023 1056 Last data filed at 08/04/2023 0800 Gross per 24 hour  Intake 480 ml  Output 350 ml  Net 130 ml      08/04/2023    4:23 AM 08/03/2023    2:51 PM 07/02/2023    2:11 PM  Last 3 Weights  Weight (lbs) 206 lb 12.7 oz 205 lb 11 oz 206 lb  Weight (kg) 93.8 kg 93.3 kg 93.441 kg      Telemetry    Converted from afib to sinus around 9 PM yesterday, now sinus brady in the 50s - Personally Reviewed  Physical Exam   GEN: No acute distress.   Neck: No JVD Cardiac: RRR, no murmurs, rubs, or gallops.  Respiratory: Clear to auscultation bilaterally. GI: Soft, nontender, non-distended  MS: No edema; No deformity. Neuro:  Nonfocal  Psych: Normal affect   New pertinent results (labs, ECG, imaging, cardiac studies)    Echo 4/3: EF 60-65%, mild LVH, RV not well seen. No significant valve disease.  Assessment & Plan    Afib with RVR, new diagnosis -echo reassuring -converted to sinus yesterday PM -chadsvasc=3, started Xarelto, tolerating -sinus brady in the 50s on 25 mg BID metoprolol  tartrate. Will convert to 25 mg daily metoprolol succinate -recommend outpatient sleep study.  -tsh normal   Hypertension -initially low on diltiazem drip, but then normalized -holding indapamide, spironolactone given borderline BP -we discussed--has had excellent control since being in sinus. She has a home cuff. Will hold home BP meds for now, she will check home Bps and bring log to follow up, and she will contact us if they rise significantly before her visit   New type II diabetes -A1c 6.6, reasonable to work on lifestyle first -no aspirin as she will be on DOAC -previously had statin myalgia, pending lipid clinic evaluation  [facility] will sign off.   Medication Recommendations:  metoprolol succinate 25 mg daily, Xarelto 20 mg daily with dinner. Hold prior to admission indapamide, potassium, spironolactone until follow up Other recommendations (labs, testing, etc):  she will check home Bps and bring log to follow up Follow up as an outpatient:  We will arrange for outpatient cardiology follow up     Signed, Jodelle Red, MD  08/04/2023, 10:56 AM

## 2023-08-04 NOTE — Progress Notes (Signed)
 Daily Progress Note Intern Pager: [phone]  Patient name: [name] Medical record number: [id number] Date of birth: 1965/08/05 Age: 58 y.o. Gender: female  Primary Care Provider: Doreene Eland, MD Consultants: Cardiology Code Status: Full  Pt Overview and Major Events to Date:  4/3: Admitted, new A-fib with RVR, converted to sinus rhythm with diltiazem and started Xarelto  Assessment and Plan: [name] is a 58 year old female with a history of hypertension, prediabetes, hyperlipidemia who presented with palpitations and was found to be in new A-fib with RVR.  Converted to sinus rhythm control with diltiazem and started on Xarelto per cardiology.  Mild LVH on echo with normal EF and no valvular abnormalities.  May have OSA per report of heavy snoring, will recommend outpatient sleep study.  Assessment & Plan New onset a-fib [facility]) Presented in A-fib with RVR, this is the first episode.  Converted to sinus rhythm with diltiazem drip, this was discontinued overnight due to heart rates in the mid to high 40s.  Appears patient has a history of sinus bradycardia. Echo showed EF 60 to 55%, no valvular abnormalities, mild LVH.  Reports adherence with anti-hypertensives.  Not currently taking statin due to reports of myalgias.  Thyroid testing normal.  History of diabetic range A1c (see below). Patient reports heavy snoring, has not had sleep study in the past.  CHA2DS2-VASc score 3 for gender/hypertension/diabetes, started on Xarelto per cardiology.  -Holding metoprolol 25 mg due to bradycardia -Cardiology following, appreciate recs -Continue Xarelto 20 mg daily, plan for follow-up in A-fib clinic for potential cardioversion if still in A-fib and stable on anticoagulation for >3 weeks versus TEE-CV --Continuous cardiac monitoring --Outpatient sleep study to eval for OSA --AM CBC, BMP, mag  Essential hypertension, benign Normotensive after diltiazem drip.  Home meds  indapamide 2.5 mg daily, spironolactone 25 mg daily, potassium 20 mEq daily. -Continue to hold home meds will restart/switch to different agent as needed -Will review chart and discuss with patient other antihypertensives tried/failed in the past  Type 2 diabetes mellitus without complication, without long-term current use of insulin (HCC) Last A1c 6.6 in 11/24, not started on diabetes medications at that time -Follow-up A1c, start treatment as indicated    FEN/GI: Heart healthy PPx: Xarelto Dispo: Pending clinical stability  Subjective:  Patient is feeling well, denies ongoing palpitations or chest pain.  Feels ready to go home today.  Objective: Temp:  [98 F (36.7 C)-98.7 F (37.1 C)] 98.2 F (36.8 C) (04/04 0737) Pulse Rate:  [46-147] 50 (04/04 0737) Resp:  [16-21] 18 (04/04 0737) BP: (106-132)/(37-73) 123/62 (04/04 0737) SpO2:  [98 %-100 %] 99 % (04/04 0737) Weight:  [93.3 kg-93.8 kg] 93.8 kg (04/04 0423) Physical Exam: General: Sitting in chair, pleasant and conversant, no acute distress Cardiovascular: Bradycardic, regular rhythm, no murmurs rubs or gallops Respiratory: CTAB, normal work of breathing on room air Extremities: No edema to BLE  Laboratory: Most recent CBC Lab Results  Component Value Date   WBC 5.4 08/04/2023   HGB 12.4 08/04/2023   HCT 35.9 (L) 08/04/2023   MCV 78.2 (L) 08/04/2023   PLT 259 08/04/2023   Most recent BMP    Latest Ref Rng & Units 08/04/2023    3:50 AM  BMP  Glucose 70 - 99 mg/dL 147   BUN 6 - 20 mg/dL 18   Creatinine 8.29 - 1.00 mg/dL 5.62   Sodium 130 - 865 mmol/L 139   Potassium 3.5 - 5.1 mmol/L 4.0  Chloride 98 - 111 mmol/L 108   CO2 22 - 32 mmol/L 22   Calcium 8.9 - 10.3 mg/dL 8.8     Other pertinent labs none  Imaging/Diagnostic Tests: No new images or diagnostic tests  Lorayne Bender, MD 08/04/2023, 7:58 AM  PGY-1, [facility] Family Medicine FPTS Intern pager: [phone], text pages welcome Secure chat group [facility]  [facility] Teaching Service

## 2023-08-04 NOTE — Assessment & Plan Note (Signed)
 Normotensive after diltiazem drip.  Home meds indapamide 2.5 mg daily, spironolactone 25 mg daily, potassium 20 mEq daily. -Continue to hold home meds will restart/switch to different agent as needed -Will review chart and discuss with patient other antihypertensives tried/failed in the past

## 2023-08-04 NOTE — TOC Transition Note (Signed)
 Transition of Care [facility]) - Discharge Note   Patient Details  Name: [name] MRN: [id number] Date of Birth: 12-Jan-1966  [facility] of [facility]) CM/SW Contact:  Leone Haven, RN Phone Number: 08/04/2023, 12:19 PM   Clinical Narrative:    For dc today, her son will transport her home today.  TOC pharmacy to fill the first 30 days for xarelto and NCM gave her 10.00 co pay coupon for refill that she can use once medication deductible has been met.  Other wise the copay will be 35.00 .           Patient Goals and CMS Choice            Discharge Placement                       Discharge Plan and Services Additional resources added to the After Visit Summary for                                       Social Drivers of Health (SDOH) Interventions SDOH Screenings   Food Insecurity: Food Insecurity Present (08/03/2023)  Housing: Low Risk  (08/03/2023)  Transportation Needs: No Transportation Needs (08/03/2023)  Utilities: Not At Risk (08/03/2023)  Alcohol Screen: Low Risk  (06/01/2023)  Depression (PHQ2-9): Low Risk  (03/10/2023)  Financial Resource Strain: Low Risk  (06/01/2023)  Physical Activity: Sufficiently Active (06/01/2023)  Social Connections: Socially Isolated (06/01/2023)  Stress: No Stress Concern Present (06/01/2023)  Tobacco Use: Low Risk  (08/03/2023)     Readmission Risk Interventions     No data to display

## 2023-08-04 NOTE — Progress Notes (Signed)
 F/u arranged at [facility] location per MD request 4/15, outlined on AVS.

## 2023-08-09 ENCOUNTER — Encounter: Payer: Self-pay | Admitting: Student

## 2023-08-09 ENCOUNTER — Ambulatory Visit: Payer: Self-pay | Admitting: Student

## 2023-08-09 VITALS — BP 152/56 | HR 88 | Ht 68.0 in | Wt 210.4 lb

## 2023-08-09 DIAGNOSIS — I10 Essential (primary) hypertension: Secondary | ICD-10-CM | POA: Diagnosis not present

## 2023-08-09 DIAGNOSIS — I480 Paroxysmal atrial fibrillation: Secondary | ICD-10-CM

## 2023-08-09 DIAGNOSIS — R29818 Other symptoms and signs involving the nervous system: Secondary | ICD-10-CM | POA: Diagnosis not present

## 2023-08-09 DIAGNOSIS — E119 Type 2 diabetes mellitus without complications: Secondary | ICD-10-CM | POA: Diagnosis not present

## 2023-08-09 NOTE — Patient Instructions (Signed)
 It was great to see you! Thank you for allowing me to participate in your care!   I recommend that you always bring your medications to each appointment as this makes it easy to ensure we are on the correct medications and helps [place] not miss when refills are needed.  Our plans for today:  - Please continue taking 25 mg of spironolactone  - I have sent a referral for sleep study.  They will call you with an appointment in the next 2 weeks. - follow-up with Cardiologist - Follow-up with PCP in May  Take care and seek immediate care sooner if you develop any concerns. Please remember to show up 15 minutes before your scheduled appointment time!  Tiffany Kocher, DO [facility] Family Medicine

## 2023-08-09 NOTE — Assessment & Plan Note (Signed)
 Poorly controlled.  Multiple allergies/side effects with blood pressure medications (see above). - Restarted spironolactone 25 mg today.  - Continue metoprolol - Appreciate cardiology assistance with blood pressure medications.

## 2023-08-09 NOTE — Progress Notes (Signed)
    SUBJECTIVE:   CHIEF COMPLAINT / HPI:   Hospital follow-up atrial fibrillation New onset A-fib RVR.  Hospitalized, started on diltiazem drip with improvement in rates.  Discharged on//25 on metoprolol and Eliquis.  Since then heart rates have been controlled.  Hypertension Normotensive pressures in the hospital.  Reportedly hypokalemic with indapamide.  Spironolactone was also held.  History of angioedema with ACE/ARB, and reportedly significant peripheral edema with amlodipine.  Blood pressure elevated today, she has not taken her metoprolol today.  Concern for sleep apnea Hospital team had concern for sleep apnea. Do you snore loudly: 0 Do you often feel fatigued or tired or sleepy during the day time: 1 Has anyone observed you stop breathing at night: 0 Do you have high blood pressure: 1 BMI (>35 +1): 0 AGE (>50 +1): 1 Neck circumference (>40 +1): 0 Gender (Female +1): 0 STOP-BANG score: 3, intermediate risk  Type 2 diabetes Hospital team requested follow-up of A1c.  Reviewed A1c 6.2.  Well-controlled with diet and exercise.  OBJECTIVE:   BP (!) 152/56   Pulse 88   Ht 5\' 8"  (1.727 m)   Wt 210 lb 6.4 oz (95.4 kg)   SpO2 100%   BMI 31.99 kg/m    General: NAD, pleasant Cardio: RRR, no MRG. Cap Refill <2s. Respiratory: CTAB, normal wob on RA Skin: Warm and dry  ASSESSMENT/PLAN:   Assessment & Plan Suspected sleep apnea Intermediate risk STOP-BANG. - Sleep study, titration referral HYPERTENSION, BENIGN SYSTEMIC Poorly controlled.  Multiple allergies/side effects with blood pressure medications (see above). - Restarted spironolactone 25 mg today.  - Continue metoprolol - Appreciate cardiology assistance with blood pressure medications. Diabetes mellitus without complication (HCC) A1c 6.2 today, well-controlled with diet and exercise. - Recheck A1c 3 months Paroxysmal atrial fibrillation (HCC) Rate controlled, regular rhythm today.  Currently on Eliquis.  -  Continue metoprolol, Eliquis - Follow-up with cardiology   Follow recommendations Discussed lipid-lowering medications Consider increasing spironolactone versus adding on blood pressure medication.  Of note possible angioedema with ACE/ARB, not tolerate amlodipine due to peripheral edema, and has history of hypokalemia with indapamide. Recommend repeat A1c in 3 months, if not well-controlled consider starting metformin or other agent as appropriate. Follow-up sleep study results  Tiffany Kocher, DO [facility]

## 2023-08-15 ENCOUNTER — Ambulatory Visit (HOSPITAL_BASED_OUTPATIENT_CLINIC_OR_DEPARTMENT_OTHER): Admitting: Family

## 2023-08-15 VITALS — BP 124/62 | HR 39 | Ht 68.0 in | Wt 211.4 lb

## 2023-08-15 DIAGNOSIS — R001 Bradycardia, unspecified: Secondary | ICD-10-CM

## 2023-08-15 DIAGNOSIS — D6859 Other primary thrombophilia: Secondary | ICD-10-CM | POA: Diagnosis not present

## 2023-08-15 DIAGNOSIS — I10 Essential (primary) hypertension: Secondary | ICD-10-CM

## 2023-08-15 DIAGNOSIS — I480 Paroxysmal atrial fibrillation: Secondary | ICD-10-CM

## 2023-08-15 DIAGNOSIS — E119 Type 2 diabetes mellitus without complications: Secondary | ICD-10-CM

## 2023-08-15 MED ORDER — METOPROLOL SUCCINATE ER 25 MG PO TB24: 12.5000 mg | ORAL_TABLET | Freq: Every day | ORAL | 3 refills | Status: DC

## 2023-08-15 MED ORDER — RIVAROXABAN 20 MG PO TABS: 20.0000 mg | ORAL_TABLET | Freq: Every day | ORAL | 3 refills | Status: AC

## 2023-08-15 NOTE — Patient Instructions (Signed)
 Medication Instructions:  Your physician has recommended you make the following change in your medication:   Decrease Metoprolol 12.5mg  daily   Follow-Up: At [facility], you and your health needs are our priority.  As part of our continuing mission to provide you with exceptional heart care, our providers are all part of one team.  This team includes your primary Cardiologist (physician) and Advanced Practice Providers or APPs (Physician Assistants and Nurse Practitioners) who all work together to provide you with the care you need, when you need it.  Please follow up in 3 months  with Dr. Veryl Gottron, Slater Duncan, NP or Neomi Banks, NP   We recommend signing up for the patient portal called "MyChart".  Sign up information is provided on this After Visit Summary.  MyChart is used to connect with patients for Virtual Visits (Telemedicine).  Patients are able to view lab/test results, encounter notes, upcoming appointments, etc.  Non-urgent messages can be sent to your provider as well.   To learn more about what you can do with MyChart, go to [email or web address].   Other Instructions We will check in via mychart in one week to assess HR

## 2023-08-15 NOTE — Progress Notes (Signed)
 Cardiology Office Note:  .   Date:  08/15/2023  ID:  [name], DOB [birth year], MRN [id number] PCP: Arn Lane, MD  [facility] Providers Cardiologist:  Janelle Mediate, MD    History of Present Illness: .   [name] is a 58 y.o. female with hx of HTN, DM2, atrial fibrillation.  Family history notable for youngest brother with a murmur, mother with MI, brother with heart failure.  Admitted 4/3 - 08/04/2023 with new onset A-fib RVR.  Metoprolol  decreased to 25 mg daily due to bradycardia.  Xarelto  20 mg daily initiated.  Echo LVEF 60 to 65%, mild LVH.  Indapamide  spironolactone  held due to normotension.  Presents today for follow-up independently.  Since last seen her primary care provider has resumed spironolactone  for elevated BP.  She works an active job as a Location manager.  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  Heart rate at home has been 30s-50s. Higher with activity but as low as 39 bpm with rest.  She did have 1 episode of near syncope while lifting a heavy shaft at work, likely vasovagal. No chest pain, edema, dyspnea since discharge.  Prior antihypertensives ACE/ARB - angioedema Amlodipine  - peripheral edema  ROS: Please see the history of present illness.    All other systems reviewed and are negative.   Studies Reviewed: .        Cardiac Studies & Procedures   ______________________________________________________________________________________________   STRESS TESTS  MYOCARDIAL PERFUSION IMAGING 06/16/2015  Narrative  Nuclear stress EF: 56%.  This is a low risk study.  The left ventricular ejection fraction is normal (55-65%).  There is a small defect of mild severity present in the apical septal and apex location. The defect is non-reversible. This is consistent with breast attenuation artifact with no ischemia noted.  There is a small focal area of radiotracer that  appears limited to the stress images noted on the right breast. Recommend followup mammogram if patient has not had one recently.   ECHOCARDIOGRAM  ECHOCARDIOGRAM COMPLETE 08/03/2023  Narrative ECHOCARDIOGRAM REPORT    Patient Name:   [name] Date of Exam: 08/03/2023 Medical Rec #:  [id number]              Height:       68.0 in Accession #:    [id number]             Weight:       206.0 lb Date of Birth:  1966-03-26              BSA:          2.070 m Patient Age:    58 years               BP:           106/69 mmHg Patient Gender: F                      HR:           141 bpm. Exam Location:  Inpatient  Procedure: 2D Echo, Cardiac Doppler and Color Doppler (Both Spectral and Color Flow Doppler were utilized during procedure).  Indications:    Atrial Fibrillation I48.91  History:        Patient has no prior history of Echocardiogram examinations. Risk Factors:Hypertension.  Sonographer:    Astrid Blamer Referring Phys: [id number] Blossom Burkes MCINTYRE  IMPRESSIONS  1. Left ventricular ejection fraction, by estimation, is 60 to 65%. The left ventricle has normal function. The left ventricle has no regional wall motion abnormalities. There is mild left ventricular hypertrophy. Left ventricular diastolic function could not be evaluated. 2. Right ventricular systolic function was not well visualized. The right ventricular size is not well visualized. 3. The mitral valve is grossly normal. No evidence of mitral valve regurgitation. 4. The aortic valve is tricuspid. Aortic valve regurgitation is not visualized. 5. Rhythm strip during this exam demonstrates atrial fibrillation and with RVR.  Comparison(s): No prior Echocardiogram.  FINDINGS Left Ventricle: Left ventricular ejection fraction, by estimation, is 60 to 65%. The left ventricle has normal function. The left ventricle has no regional wall motion abnormalities. The left ventricular internal cavity size was normal in size.  There is mild left ventricular hypertrophy. Left ventricular diastolic function could not be evaluated due to atrial fibrillation. Left ventricular diastolic function could not be evaluated.  Right Ventricle: The right ventricular size is not well visualized. Right vetricular wall thickness was not well visualized. Right ventricular systolic function was not well visualized.  Left Atrium: Left atrial size was normal in size.  Right Atrium: Right atrial size was normal in size.  Pericardium: There is no evidence of pericardial effusion.  Mitral Valve: The mitral valve is grossly normal. No evidence of mitral valve regurgitation.  Tricuspid Valve: The tricuspid valve is not well visualized. Tricuspid valve regurgitation is not demonstrated.  Aortic Valve: The aortic valve is tricuspid. Aortic valve regurgitation is not visualized. Aortic valve peak gradient measures 6.4 mmHg.  Pulmonic Valve: The pulmonic valve was grossly normal. Pulmonic valve regurgitation is trivial.  Aorta: The aortic root and ascending aorta are structurally normal, with no evidence of dilitation.  IAS/Shunts: No atrial level shunt detected by color flow Doppler.  EKG: Rhythm strip during this exam demonstrates atrial fibrillation and with RVR.   LEFT VENTRICLE PLAX 2D LVIDd:         3.90 cm LVIDs:         2.70 cm LV PW:         1.10 cm LV IVS:        1.10 cm LVOT diam:     1.80 cm LVOT Area:     2.54 cm   LEFT ATRIUM             Index LA Vol (A2C):   49.3 ml 23.82 ml/m LA Vol (A4C):   61.7 ml 29.81 ml/m LA Biplane Vol: 59.4 ml 28.70 ml/m AORTIC VALVE AV Area (Vmax): 2.48 cm AV Vmax:        126.00 cm/s AV Peak Grad:   6.4 mmHg LVOT Vmax:      123.00 cm/s  AORTA Ao Root diam: 2.70 cm   SHUNTS Systemic Diam: 1.80 cm  Dinah Franco MD Electronically signed by Dinah Franco MD Signature Date/Time: 08/03/2023/4:06:44 PM    Final           ______________________________________________________________________________________________      Risk Assessment/Calculations:    CHA2DS2-VASc Score = 3   This indicates a 3.2% annual risk of stroke. The patient's score is based upon: CHF History: 0 HTN History: 1 Diabetes History: 1 Stroke History: 0 Vascular Disease History: 0 Age Score: 0 Gender Score: 1            Physical Exam:   VS:  BP 124/62   Pulse (!) 39   Ht 5\' 8"  (1.727 m)   Wt 211  lb 6.4 oz (95.9 kg)   SpO2 100%   BMI 32.14 kg/m    Wt Readings from Last 3 Encounters:  08/15/23 211 lb 6.4 oz (95.9 kg)  08/09/23 210 lb 6.4 oz (95.4 kg)  08/04/23 206 lb 12.7 oz (93.8 kg)    GEN: Well nourished, well developed in no acute distress NECK: No JVD; No carotid bruits CARDIAC: RRR, no murmurs, rubs, gallops RESPIRATORY:  Clear to auscultation without rales, wheezing or rhonchi  ABDOMEN: Soft, non-tender, non-distended EXTREMITIES:  No edema; No deformity   ASSESSMENT AND PLAN: .    A-fib/hypercoagulable state-maintaining NSR by EKG today.   Reduce metoprolol  succinate to 12.5 mg daily due to bradycardia 39 bpm by EKG today. Isolated episode of near syncope while lifting heavy shaft at work likely vasovagal. If bradycardia persists, may need to consider alternate to BB.  Continue Xarelto  20 mg daily.  Denies bleeding complications. CHA2DS2-VASc Score = 3 [CHF History: 0, HTN History: 1, Diabetes History: 1, Stroke History: 0, Vascular Disease History: 0, Age Score: 0, Gender Score: 1].  Therefore, the patient's annual risk of stroke is 3.2 %.     Refer to EP for discussion of possible ablation. HTN-BP at goal less than 130/80.  Continue spironolactone  25 mg daily. Refer to PREP exercise program.  DM2-Per primary care team. HLD-prior myalgia on statin. Not addressed hti sclinic visit, discuss at follow up given DM2.        Dispo: follow up in 3 mos  Signed, Clearnce Curia, NP

## 2023-08-17 ENCOUNTER — Telehealth: Payer: Self-pay

## 2023-08-17 NOTE — Telephone Encounter (Signed)
 Called  re: PREP program referral, left voicemail requesting return call.

## 2023-08-19 ENCOUNTER — Encounter (HOSPITAL_BASED_OUTPATIENT_CLINIC_OR_DEPARTMENT_OTHER): Payer: Self-pay | Admitting: Family

## 2023-08-21 ENCOUNTER — Telehealth: Payer: Self-pay

## 2023-08-21 NOTE — Telephone Encounter (Signed)
 Returned her call, left voicemail requesting return call re: PREP program

## 2023-08-22 ENCOUNTER — Encounter (HOSPITAL_BASED_OUTPATIENT_CLINIC_OR_DEPARTMENT_OTHER): Payer: Self-pay

## 2023-08-22 DIAGNOSIS — R001 Bradycardia, unspecified: Secondary | ICD-10-CM

## 2023-08-22 DIAGNOSIS — I480 Paroxysmal atrial fibrillation: Secondary | ICD-10-CM

## 2023-08-22 NOTE — Telephone Encounter (Signed)
 Update as requested

## 2023-08-24 NOTE — Telephone Encounter (Signed)
 Recommend stop Metoprolol  and report back heart rate readings in 1 week.   Tarren Sabree S Lauraann Missey, NP

## 2023-08-29 ENCOUNTER — Other Ambulatory Visit: Payer: Self-pay | Admitting: Family Medicine

## 2023-08-29 DIAGNOSIS — I10 Essential (primary) hypertension: Secondary | ICD-10-CM

## 2023-09-01 ENCOUNTER — Ambulatory Visit: Payer: BC Managed Care – PPO | Admitting: Family Medicine

## 2023-09-01 ENCOUNTER — Encounter: Payer: Self-pay | Admitting: Family Medicine

## 2023-09-01 VITALS — BP 138/67 | HR 42 | Ht 68.0 in | Wt 208.0 lb

## 2023-09-01 DIAGNOSIS — I10 Essential (primary) hypertension: Secondary | ICD-10-CM

## 2023-09-01 DIAGNOSIS — G4700 Insomnia, unspecified: Secondary | ICD-10-CM

## 2023-09-01 DIAGNOSIS — R0683 Snoring: Secondary | ICD-10-CM | POA: Diagnosis not present

## 2023-09-01 DIAGNOSIS — R001 Bradycardia, unspecified: Secondary | ICD-10-CM | POA: Insufficient documentation

## 2023-09-01 DIAGNOSIS — I4891 Unspecified atrial fibrillation: Secondary | ICD-10-CM | POA: Diagnosis not present

## 2023-09-01 DIAGNOSIS — E1169 Type 2 diabetes mellitus with other specified complication: Secondary | ICD-10-CM

## 2023-09-01 DIAGNOSIS — E785 Hyperlipidemia, unspecified: Secondary | ICD-10-CM

## 2023-09-01 DIAGNOSIS — E876 Hypokalemia: Secondary | ICD-10-CM

## 2023-09-01 MED ORDER — MELATONIN 3 MG PO CAPS: 3.0000 mg | ORAL_CAPSULE | Freq: Every day | ORAL | Status: DC

## 2023-09-01 NOTE — Assessment & Plan Note (Addendum)
 Trial of low dose melatonin discussed

## 2023-09-01 NOTE — Progress Notes (Signed)
    SUBJECTIVE:   CHIEF COMPLAINT / HPI:   Afib/Bradycardia/HTN: Recently d/ced from the hospital for new-onset Afib. She is compliant with her Xarelto  and is requesting refills. She endorses occasional palpitations. However, there was no chest pain, dizziness, or SOB. She was supposed to get a sleep study done due to this issue. She also has hx of snoring. She follows closely with her cardiologist and has an appointment with the electrophysiology group on 09/15/23. Metoprolol  d/ced by Cards due to her Bradycardia. Here for BP F/U as well. She checks BP closely at home.   HLD: Diet controlled. She was on Crestor  in the past but d/ced due to intolerance.  Hx of Hypokalemia: Off potassium supplement. She is here for follow up.  Insomnia: She endorsed issues with sleeping in the last few weeks.   PERTINENT  PMH / PSH: PMHx reviewed  OBJECTIVE:   BP 138/67   Pulse (!) 42   Ht 5\' 8"  (1.727 m)   Wt 208 lb (94.3 kg)   SpO2 100%   BMI 31.63 kg/m   Physical Exam Vitals and nursing note reviewed.  Cardiovascular:     Rate and Rhythm: Regular rhythm. Bradycardia present.     Heart sounds: Normal heart sounds. No murmur heard. Pulmonary:     Effort: Pulmonary effort is normal. No respiratory distress.     Breath sounds: Normal breath sounds. No stridor. No wheezing.  Psychiatric:        Mood and Affect: Mood normal.        Behavior: Behavior normal.      ASSESSMENT/PLAN:   Assessment & Plan Atrial fibrillation, unspecified type (HCC) Rate and Rhythm controlled today Continue Xarelto   Holding betablocker due to bradycardia F/U with Cards as planned Snoring Referral to pulm for sleep study Essential hypertension, benign BP improved with recheck Monitor closely for now on her current regimen while off betablocker Bradycardia Asymptomatic currently Etiology unclear Recent TSH normal ECHO reviewed Keep holding betablocker F/U cards as needed ED if  symptomatic Hyperlipidemia, unspecified hyperlipidemia type LDL <70 Continue diet controlled for now Hypokalemia Bmet checked Insomnia, unspecified type Trial of low dose melatonin discussed Type 2 diabetes mellitus with other specified complication, without long-term current use of insulin (HCC)    Need ophthalmology referral for DM eye exam   Penni Bowman, MD [facility]

## 2023-09-01 NOTE — Patient Instructions (Addendum)
 Nice seeing you. I have placed a new referral to sleep clinic for sleep study. They should call you from their office.  Monitor BP closely at home.  I'll see you in 4 weeks  Melatonin Capsules or Tablets What is this medication? MELATONIN (mel uh TOH nin) is promoted for sleep disorders, such as insomnia or jet lag. Melatonin helps regulate your sleep cycle. This supplement is not intended to diagnose, treat, cure, or prevent any disease. This medicine may be used for other purposes; ask your health care provider or pharmacist if you have questions. What should I tell my care team before I take this medication? They need to know if you have any of these conditions: Cancer Depression or mental health condition Diabetes Frequently drink alcohol Hormone problems Immune system problems Liver disease Lung or breathing disease, such as asthma Organ transplant Seizure disorder An unusual or allergic reaction to melatonin, other medications, foods, dyes, or preservatives Pregnant or trying to get pregnant Breastfeeding How should I use this medication? Take this medication by mouth. Do not take with food. This medication is usually taken 1 or 2 hours before bedtime. After taking this medication, limit your activities to those needed to prepare for bed. Some products may be chewed or dissolved in the mouth before swallowing. Some tablets or capsules must be swallowed whole; do not cut, crush, or chew. Follow the directions on the package labeling, or take as directed by your care team. Do not take it more often than directed. Talk to your care team the use of this medication in children. Special care may be needed. This medication is not recommended for use in children without a prescription. Overdosage: If you think you have taken too much of this medicine contact a poison control center or emergency room at once. NOTE: This medicine is only for you. Do not share this medicine with others. What  if I miss a dose? If you miss taking your dose at the usual time, skip that dose. If it is almost time for your next dose, take only that dose. Do not take double or extra doses. What may interact with this medication? Do not take this medication with any of the following: Fluvoxamine Ramelteon Tasimelteon This medication may also interact with the following: Alcohol Caffeine Carbamazepine Certain antibiotics, such as ciprofloxacin Certain medications for depression, anxiety, or mental health conditions Cimetidine Estrogen or progestin hormones Methoxsalen Nifedipine Other herbal or dietary supplements Other medications that help you fall asleep Phenobarbital Rifampin  Smoking tobacco Tamoxifen Warfarin This list may not describe all possible interactions. Give your health care provider a list of all the medicines, herbs, non-prescription drugs, or dietary supplements you use. Also tell them if you smoke, drink alcohol, or use illegal drugs. Some items may interact with your medicine. What should I watch for while using this medication? See your care team if your symptoms do not get better or if they get worse. Do not take this medication for more than 2 weeks unless your care team tells you to. This medication may affect your coordination, reaction time, or judgment. Do not drive or operate machinery until you know how this medication affects you. Sit up or stand slowly to reduce the risk of dizzy or fainting spells. Drinking alcohol with this medication can increase the risk of these side effects. You may do unusual sleep behaviors or activities you do not remember the day after taking this medication. Activities include driving, making or eating food, talking on the  phone, sexual activity, or sleep walking. Stop taking this medication and call your care team right away if you find out you have done activities like this. Talk to your care team before you use this medication if you are  currently being treated for an emotional, mental, or sleep problem. This medication may interfere with your treatment. Herbal or dietary supplements are not regulated like medications. Rigid quality control standards are not required for dietary supplements. The purity and strength of these products can vary. The safety and effect of this dietary supplement for a certain disease or illness is not well known. This product is not intended to diagnose, treat, cure or prevent any disease. The Food and Drug Administration suggests the following to help consumers protect themselves: Always read product labels and follow directions. Natural does not mean a product is safe for humans to take. Look for products that include USP after the ingredient name. This means that the manufacturer followed the standards of the [place]  Pharmacopoeia. Supplements made or sold by a nationally known food or drug company are more likely to be made under tight controls. You can write to the company for more information about how the product was made. What side effects may I notice from receiving this medication? Side effects that you should report to your care team as soon as possible: Allergic reactions--skin rash, itching, hives, swelling of the face, lips, tongue, or throat Mood and behavior changes--anxiety, nervousness, confusion, hallucinations, irritability, hostility, thoughts of suicide or self-harm, worsening mood, feelings of depression Side effects that usually do not require medical attention (report to your care team if they continue or are bothersome): Bedwetting in children Dizziness Drowsiness the day after use Headache Nausea This list may not describe all possible side effects. Call your doctor for medical advice about side effects. You may report side effects to FDA at 1-800-FDA-1088. Where should I keep my medication? Keep out of the reach of children. Store at room temperature or as directed on the  package label. Protect from moisture. Throw away any unused medication after the expiration date. NOTE: This sheet is a summary. It may not cover all possible information. If you have questions about this medicine, talk to your doctor, pharmacist, or health care provider.  2024 Elsevier/Gold Standard (2021-10-26 00:00:00)

## 2023-09-01 NOTE — Assessment & Plan Note (Addendum)
 LDL <70 Continue diet controlled for now

## 2023-09-01 NOTE — Assessment & Plan Note (Addendum)
 BP improved with recheck Monitor closely for now on her current regimen while off betablocker

## 2023-09-01 NOTE — Assessment & Plan Note (Addendum)
 Asymptomatic currently Etiology unclear Recent TSH normal ECHO reviewed Keep holding betablocker F/U cards as needed ED if symptomatic

## 2023-09-01 NOTE — Assessment & Plan Note (Addendum)
 Rate and Rhythm controlled today Continue Xarelto   Holding betablocker due to bradycardia F/U with Cards as planned

## 2023-09-01 NOTE — Assessment & Plan Note (Addendum)
 Bmet checked

## 2023-09-02 ENCOUNTER — Other Ambulatory Visit: Payer: Self-pay | Admitting: Family Medicine

## 2023-09-02 ENCOUNTER — Encounter: Payer: Self-pay | Admitting: Family Medicine

## 2023-09-02 LAB — BASIC METABOLIC PANEL WITH GFR
BUN/Creatinine Ratio: 12 (ref 9–23)
BUN: 9 mg/dL (ref 6–24)
CO2: 23 mmol/L (ref 20–29)
Calcium: 9.4 mg/dL (ref 8.7–10.2)
Chloride: 105 mmol/L (ref 96–106)
Creatinine, Ser: 0.74 mg/dL (ref 0.57–1.00)
Glucose: 83 mg/dL (ref 70–99)
Potassium: 4.3 mmol/L (ref 3.5–5.2)
Sodium: 141 mmol/L (ref 134–144)
eGFR: 93 mL/min/{1.73_m2} (ref >59)

## 2023-09-02 LAB — MAGNESIUM: Magnesium: 2 mg/dL (ref 1.6–2.3)

## 2023-09-05 ENCOUNTER — Ambulatory Visit: Attending: Family

## 2023-09-05 DIAGNOSIS — I480 Paroxysmal atrial fibrillation: Secondary | ICD-10-CM

## 2023-09-05 DIAGNOSIS — R001 Bradycardia, unspecified: Secondary | ICD-10-CM

## 2023-09-05 NOTE — Telephone Encounter (Signed)
 FYI

## 2023-09-05 NOTE — Progress Notes (Unsigned)
 Enrolled for [employer] to mail a ZIO XT long term holter monitor to the patients address on file.

## 2023-09-05 NOTE — Telephone Encounter (Signed)
 Thanks for your help Miller Allis! Luther Saltness can also keep an eye and if it is approaching the 5/16 appt and monitor not resulted we can always adjust the visit with Dr. Marven Slimmer if needed.  Stephaniemarie Stoffel S Deauna Yaw, NP

## 2023-09-05 NOTE — Telephone Encounter (Signed)
 Too late to make this a 3 day monitor?

## 2023-09-15 ENCOUNTER — Ambulatory Visit: Admitting: Cardiology

## 2023-09-18 ENCOUNTER — Telehealth: Payer: Self-pay | Admitting: Cardiovascular Disease

## 2023-09-18 ENCOUNTER — Encounter (HOSPITAL_COMMUNITY): Payer: Self-pay | Admitting: Emergency Medicine

## 2023-09-18 ENCOUNTER — Other Ambulatory Visit: Payer: Self-pay

## 2023-09-18 ENCOUNTER — Emergency Department (HOSPITAL_COMMUNITY)
Admission: EM | Admit: 2023-09-18 | Discharge: 2023-09-18 | Disposition: A | Discharge status: Home / Self Care | Attending: Emergency Medicine | Admitting: Emergency Medicine

## 2023-09-18 ENCOUNTER — Emergency Department (HOSPITAL_COMMUNITY)

## 2023-09-18 DIAGNOSIS — E119 Type 2 diabetes mellitus without complications: Secondary | ICD-10-CM | POA: Insufficient documentation

## 2023-09-18 DIAGNOSIS — Z79899 Other long term (current) drug therapy: Secondary | ICD-10-CM | POA: Diagnosis not present

## 2023-09-18 DIAGNOSIS — Z7901 Long term (current) use of anticoagulants: Secondary | ICD-10-CM | POA: Insufficient documentation

## 2023-09-18 DIAGNOSIS — R001 Bradycardia, unspecified: Secondary | ICD-10-CM | POA: Diagnosis not present

## 2023-09-18 DIAGNOSIS — I10 Essential (primary) hypertension: Secondary | ICD-10-CM | POA: Insufficient documentation

## 2023-09-18 DIAGNOSIS — R079 Chest pain, unspecified: Secondary | ICD-10-CM | POA: Diagnosis not present

## 2023-09-18 DIAGNOSIS — R0989 Other specified symptoms and signs involving the circulatory and respiratory systems: Secondary | ICD-10-CM | POA: Diagnosis not present

## 2023-09-18 DIAGNOSIS — I480 Paroxysmal atrial fibrillation: Secondary | ICD-10-CM

## 2023-09-18 DIAGNOSIS — R42 Dizziness and giddiness: Secondary | ICD-10-CM | POA: Diagnosis not present

## 2023-09-18 DIAGNOSIS — R0789 Other chest pain: Secondary | ICD-10-CM | POA: Diagnosis not present

## 2023-09-18 DIAGNOSIS — R918 Other nonspecific abnormal finding of lung field: Secondary | ICD-10-CM | POA: Diagnosis not present

## 2023-09-18 HISTORY — DX: Unspecified atrial fibrillation: I48.91

## 2023-09-18 LAB — CBC
HCT: 35.2 % — ABNORMAL LOW (ref 36.0–46.0)
Hemoglobin: 11.8 g/dL — ABNORMAL LOW (ref 12.0–15.0)
MCH: 27.1 pg (ref 26.0–34.0)
MCHC: 33.5 g/dL (ref 30.0–36.0)
MCV: 80.7 fL (ref 80.0–100.0)
Platelets: 247 10*3/uL (ref 150–400)
RBC: 4.36 MIL/uL (ref 3.87–5.11)
RDW: 13.9 % (ref 11.5–15.5)
WBC: 4.4 10*3/uL (ref 4.0–10.5)
nRBC: 0 % (ref 0.0–0.2)

## 2023-09-18 LAB — BASIC METABOLIC PANEL WITH GFR
Anion gap: 10 (ref 5–15)
BUN: 8 mg/dL (ref 6–20)
CO2: 24 mmol/L (ref 22–32)
Calcium: 8.9 mg/dL (ref 8.9–10.3)
Chloride: 106 mmol/L (ref 98–111)
Creatinine, Ser: 0.81 mg/dL (ref 0.44–1.00)
GFR, Estimated: >60 mL/min (ref >60)
Glucose, Bld: 118 mg/dL — ABNORMAL HIGH (ref 70–99)
Potassium: 3.7 mmol/L (ref 3.5–5.1)
Sodium: 140 mmol/L (ref 135–145)

## 2023-09-18 LAB — TROPONIN I (HIGH SENSITIVITY)
Troponin I (High Sensitivity): 5 ng/L (ref <18)
Troponin I (High Sensitivity): 5 ng/L (ref <18)

## 2023-09-18 LAB — HCG, SERUM, QUALITATIVE: Preg, Serum: NEGATIVE

## 2023-09-18 MED ORDER — ATROPINE SULFATE 1 MG/10ML IJ SOSY
1.0000 mg | PREFILLED_SYRINGE | Freq: Once | INTRAMUSCULAR | Status: AC
  Administered 2023-09-18: 1 mg via INTRAVENOUS
  Filled 2023-09-18: qty 10

## 2023-09-18 MED ORDER — MECLIZINE HCL 25 MG PO TABS: 25.0000 mg | ORAL_TABLET | Freq: Three times a day (TID) | ORAL | 0 refills | Status: DC | PRN

## 2023-09-18 NOTE — ED Notes (Signed)
 Reported to the RN that the patient's heart rate dropped to 39(Brady Rhythm). The patient is here for bradycardia.

## 2023-09-18 NOTE — ED Notes (Signed)
 Cardiology at bedside.

## 2023-09-18 NOTE — ED Provider Notes (Signed)
 [place] [facility]ERGENC[facility] DEPARTMENT AT [facility] Provider Note   CSN: [id number] Arrival date & time: 09/18/23  1157     History  Chief Complaint  Patient presents with   Bradycardia    [name] is a 58 y.o. female.  58 year old female history of atrial fibrillation on Xarelto  and previously metoprolol , hypertension, diabetes who presents to the emergency department with low heart rate, dizziness, chest discomfort.  Patient reports that she has a history of bradycardia.  Was more recently diagnosed with atrial fibrillation with RVR and started on metoprolol  and Xarelto .  Says that her heart rate got too long on the metoprolol .  Says that recently she has been feeling very lightheaded but has not passed out.  This morning she was feeling especially poor and was nauseous and started having some dull left-sided chest discomfort that radiated to her left neck.  No history of MI.  Was called by cardiology who told that her heart rate was low and that she should come to the emergency department.       Home Medications Prior to Admission medications   Medication Sig Start Date End Date Taking? Authorizing Provider  acetaminophen  (TYLENOL ) 500 MG tablet Take 500 mg by mouth every 6 (six) hours as needed for moderate pain (pain score 4-6).   Yes [provider]  cholecalciferol  (VITAMIN D3) 25 MCG (1000 UNIT) tablet Take 1,000 Units by mouth daily.   Yes [provider]  meclizine  (ANTIVERT ) 25 MG tablet Take 1 tablet (25 mg total) by mouth 3 (three) times daily as needed for dizziness. 09/18/23  Yes Ninetta Basket, MD  rivaroxaban  (XARELTO ) 20 MG TABS tablet Take 1 tablet (20 mg total) by mouth daily with supper. 08/15/23  Yes Clearnce Curia, NP  spironolactone  (ALDACTONE ) 25 MG tablet TAKE 1 TABLET BY MOUTH EVERY DAY 09/04/23  Yes Arn Lane, MD  metoprolol  succinate (TOPROL -XL) 25 MG 24 hr tablet Take 0.5 tablets (12.5 mg total) by mouth  daily. Patient not taking: Reported on 09/01/2023 08/15/23   Clearnce Curia, NP      Allergies    Tetracycline hcl, Angiotensin receptor blockers, Nickel, Sulfamethoxazole-trimethoprim, Amlodipine , and Crestor  [rosuvastatin  calcium ]    Review of Systems   Review of Systems  Physical Exam Updated Vital Signs BP (!) 141/46   Pulse (!) 47   Temp 98.2 F (36.8 C) (Oral)   Resp (!) 21   Ht 5\' 8"  (1.727 m)   Wt 93 kg   SpO2 98%   BMI 31.17 kg/m  Physical Exam Vitals and nursing note reviewed.  Constitutional:      General: She is not in acute distress.    Appearance: She is well-developed.  HENT:     Head: Normocephalic and atraumatic.     Right Ear: External ear normal.     Left Ear: External ear normal.     Nose: Nose normal.  Eyes:     Extraocular Movements: Extraocular movements intact.     Conjunctiva/sclera: Conjunctivae normal.     Pupils: Pupils are equal, round, and reactive to light.  Cardiovascular:     Rate and Rhythm: Regular rhythm. Bradycardia present.     Heart sounds: No murmur heard. Pulmonary:     Effort: Pulmonary effort is normal. No respiratory distress.     Breath sounds: Normal breath sounds.  Musculoskeletal:     Cervical back: Normal range of motion and neck supple.     Right lower leg: No  edema.     Left lower leg: No edema.  Skin:    General: Skin is warm and dry.  Neurological:     Mental Status: She is alert and oriented to person, place, and time. Mental status is at baseline.  Psychiatric:        Mood and Affect: Mood normal.     ED Results / Procedures / Treatments   Labs (all labs ordered are listed, but only abnormal results are displayed) Labs Reviewed  BASIC METABOLIC PANEL WITH GFR - Abnormal; Notable for the following components:      Result Value   Glucose, Bld 118 (*)    All other components within normal limits  CBC - Abnormal; Notable for the following components:   Hemoglobin 11.8 (*)    HCT 35.2 (*)    All other  components within normal limits  HCG, SERUM, QUALITATIVE  TROPONIN I (HIGH SENSITIVITY)  TROPONIN I (HIGH SENSITIVITY)    EKG EKG Interpretation Date/Time:  Monday Sep 18 2023 12:12:42 EDT Ventricular Rate:  48 PR Interval:  142 QRS Duration:  78 QT Interval:  438 QTC Calculation: 391 R Axis:   27  Text Interpretation: Sinus bradycardia Otherwise normal ECG When compared with ECG of 15-Aug-2023 10:57, PREVIOUS ECG IS PRESENT Confirmed by Shyrl Doyne [phone]) on 09/18/2023 12:53:48 PM  Radiology LONG TERM MONITOR (3-14 DAYS) Result Date: 09/18/2023 Patch Wear Time:  3 days and 6 hours (2025-05-09T10:10:27-0400 to 2025-05-12T16:50:19-0400) Patient had a min HR of 33 bpm, max HR of 142 bpm, and avg HR of 56 bpm. Predominant underlying rhythm was Sinus Rhythm. No VT, SVT, atrial fibrillation, high degree block, or pauses noted. Isolated atrial ectopy was occasional (1.3%) and ventricular ectopy was rare (<1%). There were >120 patient triggered events. These were sinus bradycardia/sinus rhythm, rarely with PAC or PVC.   DG Chest Portable 1 View Result Date: 09/18/2023 CLINICAL DATA:  Bradycardia EXAM: PORTABLE CHEST 1 VIEW COMPARISON:  Chest radiograph dated 08/03/2023 FINDINGS: Low lung volumes with bronchovascular crowding. Hazy medial right upper lung opacity. Patchy left basilar opacity. No pleural effusion or pneumothorax. Enlarged cardiomediastinal silhouette is likely projectional. No acute osseous abnormality. IMPRESSION: 1. Low lung volumes with bronchovascular crowding. Hazy medial right upper lung opacity and patchy left basilar opacity may represent atelectasis, aspiration, or pneumonia. 2.  Enlarged cardiomediastinal silhouette is likely projectional. Electronically Signed   By: Ula Gambler M.D.   On: 09/18/2023 15:03    Procedures Procedures    Medications Ordered in ED Medications  atropine  1 MG/10ML injection 1 mg (1 mg Intravenous Given 09/18/23 1316)    ED Course/  Medical Decision Making/ A&P Clinical Course as of 09/19/23 0830  Mon Sep 18, 2023  1355 Trish from cardiology is sending someone to the emergency department to evaluate the patient [RP]  1651 Dr Veryl Gottron from cardiology has seen the patient.  [RP]    Clinical Course User Index [RP] Ninetta Basket, MD                                 Medical Decision Making Amount and/or Complexity of Data Reviewed Labs: ordered. Radiology: ordered.  Risk Prescription drug management.   [name] is a 58 y.o. female with comorbidities that complicate the patient evaluation including atrial fibrillation on Xarelto  and previously metoprolol , hypertension, diabetes who presents to the emergency department with low heart rate, dizziness, chest discomfort.   Initial  Ddx:  Symptomatic bradycardia, heart block, tachybradycardia syndrome, MI, PE  MDM/Course:  Patient presents emergency department with bradycardia as well as presyncope.  Also was complaining from chest pain radiating to her shoulder.  On exam is in a sinus bradycardia at 1 point in time was in the high 30s.  Blood pressure was stable.  She was mentating well.  Was complaining of some lightheadedness so was given some atropine  with improvement of her heart rate and symptoms.  She had an EKG obtained which did not show any acute ischemic changes.  Serial troponins are stable at 5.  She was seen by cardiology and it was felt that since she did not want a pacemaker there would be little benefit for coming into the hospital at this time.  They will have her follow-up with them as an outpatient.  Return precautions discussed prior to discharge.   This patient presents to the ED for concern of complaints listed in HPI, this involves an extensive number of treatment options, and is a complaint that carries with it a high risk of complications and morbidity. Disposition including potential need for admission considered.   Dispo: DC  Home. Return precautions discussed including, but not limited to, those listed in the AVS. Allowed pt time to ask questions which were answered fully prior to dc.  Records reviewed Outpatient Clinic Notes The following labs were independently interpreted: Chemistry and show no acute abnormality I independently reviewed the following imaging with scope of interpretation limited to determining acute life threatening conditions related to emergency care: Chest x-ray and agree with the radiologist interpretation with the following exceptions: none I personally reviewed and interpreted cardiac monitoring: sinus bradycardia I personally reviewed and interpreted the pt's EKG: see above for interpretation  I have reviewed the patients home medications and made adjustments as needed Consults: Cardiology  Portions of this note were generated with Dragon dictation software. Dictation errors may occur despite best attempts at proofreading.    CRITICAL CARE Performed by: Ninetta Basket   Total critical care time: 30 minutes  Critical care time was exclusive of separately billable procedures and treating other patients.  Critical care was necessary to treat or prevent imminent or life-threatening deterioration.  Critical care was time spent personally by me on the following activities: development of treatment plan with patient and/or surrogate as well as nursing, discussions with consultants, evaluation of patient's response to treatment, examination of patient, obtaining history from patient or surrogate, ordering and performing treatments and interventions, ordering and review of laboratory studies, ordering and review of radiographic studies, pulse oximetry and re-evaluation of patient's condition.    Final Clinical Impression(s) / ED Diagnoses Final diagnoses:  Bradycardia  Dizziness  Chest pain, unspecified type    Rx / DC Orders ED Discharge Orders          Ordered    meclizine   (ANTIVERT ) 25 MG tablet  3 times daily PRN        09/18/23 1658              Ninetta Basket, MD 09/19/23 0830

## 2023-09-18 NOTE — ED Triage Notes (Signed)
 Pt came in b/c she is wearing a heart montior and she was contacted and told to come in to ED for eval of HR of 38 with CP and left shoulder pain. Pain  is currently 7/10.

## 2023-09-18 NOTE — Telephone Encounter (Signed)
 Attempted to call pt again. No answer, pt is at work. Left message for her to return the call.

## 2023-09-18 NOTE — Telephone Encounter (Signed)
   Cardiac Monitor Alert  Date of alert:  09/18/2023   Patient Name: [name]  DOB: 1965-09-23  MRN: [id number]   [facility] Cardiologist: Sheryle Donning, MD  [facility] EP:  Boyce Byes, MD    Monitor Information: Long Term Monitor [ZioXT]  Reason:  Bradycardia  Ordering provider:  Dr. Tita Form walker  Tips for Triage    No need to delete red or blue text  It will delete when note signed  Atrial fib/flutter? Review previous note for plan of care. Confirm pt on anticoagulation.  Call patient to check for symptoms.  Confirm next appointment.  Ask pt if willing to go to afib clinic?   Other Rhythms? Follow guidelines for alerts. Call patient to assess symptoms   Complete same day.  If the patient does not answer >> RN to locate as much info as possible from chart review and discuss with DOD no later than 3pm.   Alerts / Repeat Alerts? If 1st alert >> follow original protocol  If 2nd alert >> follow original protocol   If 3rd or greater alert >> call patient to ensure nothing has changed  If asymptomatic send message to ordering provider to determine if alerts for that rhythm should stop >> if ordering provider requests alerts to stop >> notify company rep  If symptomatic >> in person conversation w/ DOD/Rx Provider  RN should upload strip to Epic at end of workflow  Scan to monitor in chart :1}  Alert Bradycardia - slowest HR: 38 This is the 1st alert for this rhythm.   Next Cardiology Appointment   Date:  5/22  Provider:  C. Marven Slimmer   The patient could NOT be reached by telephone today.    Arrhythmia, symptoms and history reviewed with .   Plan:        Jeneen Mire, RN  09/18/2023 8:35 AM    Brady 38 bpm 30 secs Pg 21-22 strip 3

## 2023-09-18 NOTE — Discharge Instructions (Signed)
 You were seen for lightheadedness and dizziness and low heart rate in the emergency department.   At home, please take the meclizine  for your dizziness.    Check your MyChart online for the results of any tests that had not resulted by the time you left the emergency department.   Follow-up with your primary doctor in 2-3 days regarding your visit.  Please keep your appointment with your cardiologist.  Return immediately to the emergency department if you experience any of the following: Fainting, worsening chest pain, or any other concerning symptoms.    Thank you for visiting our Emergency Department. It was a pleasure taking care of you today.

## 2023-09-18 NOTE — Telephone Encounter (Signed)
 Returning call to a nurse.

## 2023-09-18 NOTE — Consult Note (Signed)
 Cardiology Consultation   Patient ID: [name] MRN: [id number]; DOB: 05-02-1966  Admit date: 09/18/2023 Date of Consult: 09/18/2023  PCP:  Arn Lane, MD   [facility] Providers Cardiologist:  Sheryle Donning, MD  Electrophysiologist:  Boyce Byes, MD  {  Patient Profile:   [name] is a 58 y.o. female with a hx of paroxysmal atrial fibrillation, hypertension, diabetes, angioedema with ARB, familial premature MI (mother died at 22 due to MI), hyperlipidemia with myalgias on statin, who is being seen 09/18/2023 for the evaluation of sinus bradycardia at the request of Dr. Efraim Grange.  History of Present Illness:   [name] was recently admitted in early April 2025 for new onset atrial fibrillation.  She was started on IV diltiazem  and had spontaneously converted to sinus rhythm.  She was discharged on metoprolol  however is needed to be even decreased further and eventually stopped due to underlying bradycardia.  Has been off a beta-blocker for approximately 1 month.  Echocardiogram with preserved biventricular function.  She followed up with cardiology April 15, noted 1 episode of vasovagal syncope but otherwise was doing well during that time.  She was referred to EP to discuss ablation and has upcoming appointment this week with them.  Additionally heart monitor was ordered to evaluate bradycardia, she wore it for 3 days and showed minimum heart rate of 33 and average heart rate of 56.  Today patient noted heart rate roughly 38, had symptoms of feeling dizzy, nauseous and left shoulder pain.  She called our office who recommended her to come to the emergency room.  She was given 1 dose of atropine  1 mg today.  Heart rates are roughly around 45-70.  Patient reports that she has had known sinus bradycardia for several years now and told she had a athlete's heart.  She reports being mild to moderately symptomatic from this noting some  fatigue, dizziness, lightheaded although they do not always correlate with her low heart rates.  She denies any episodes of passing out or presyncopal events.  Denies any chest pain.  She says she sleeps very poorly and has sleep study pending for possible OSA.  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  Does not note any significant shortness of breath, peripheral edema, palpitations.  Reports only getting around 3 to 4 hours of sleep.  Chest x-ray pending.  Negative pregnancy test.  First troponin negative.  Potassium 3.7, creatinine 0.81, hemoglobin 11.8.  Past Medical History:  Diagnosis Date   A-fib (HCC)    Allergy     Arthritis    Lt ankle   Chronic kidney disease    right renal cyst    GERD (gastroesophageal reflux disease)    hx of no problems at present    Headache(784.0)    occasional headache    Hypertension    Influenza 04/20/2012   PONV (postoperative nausea and vomiting)    N/V and trouble urinating after surgery     Past Surgical History:  Procedure Laterality Date   BREAST EXCISIONAL BIOPSY Left    BREAST LUMPECTOMY     BREAST SURGERY  2010   lumpectomy Lt breast   CARPAL TUNNEL RELEASE Right 08/06/2015   Procedure: RIGHT CARPAL TUNNEL RELEASE;  Surgeon: Brunilda Capra, MD;  Location: [facility];  Service: Orthopedics;  Laterality: Right;   DORSAL COMPARTMENT RELEASE Right 08/06/2015   Procedure: RIGHT FIRST RELEASE DORSAL COMPARTMENT (DEQUERVAIN);  Surgeon: Brunilda Capra, MD;  Location: [facility];  Service: Orthopedics;  Laterality: Right;   ENDOMETRIAL ABLATION  10/18/2010   at [facility]   FRACTURE SURGERY  2011   Lt ankle surgeries x 3    GANGLION CYST EXCISION Right 07/02/2021   Procedure: RIGHT WRIST EXCISION DORSAL GANGLION;  Surgeon: Brunilda Capra, MD;  Location: [facility];  Service: Orthopedics;  Laterality: Right;   GANGLION CYST EXCISION Right 03/10/2022   Procedure: RIGHT WRIST EXCISION RECURRENT DORSAL  GANGLION;  Surgeon: Brunilda Capra, MD;  Location: [facility];  Service: Orthopedics;  Laterality: Right;  45 MIN   JOINT REPLACEMENT     left ankle 8/12    LAPAROSCOPIC NEPHRECTOMY  03/09/2011   Procedure: LAPAROSCOPIC NEPHRECTOMY;  Surgeon: Trent Frizzle;  Location: [facility];  Service: Urology;  Laterality: Right;  Laparoscopic Assisted Removal Of Renal Cyst    OTHER SURGICAL HISTORY     2 hand surgeries on left and shoulder surgery on right shoulder    TOTAL ANKLE ARTHROPLASTY Left 07/05/2012   Procedure: Removal of total ankle implants,I & D with insertion of  new poly spacer;  Surgeon: Amada Backer, MD;  Location: [facility];  Service: Orthopedics;  Laterality: Left;   TUBAL LIGATION  1988     Inpatient Medications: Scheduled Meds:  Continuous Infusions:  PRN Meds:   Allergies:    Allergies  Allergen Reactions   Tetracycline Hcl Shortness Of Breath, Swelling and Other (See Comments)    Large purple bruise   Angiotensin Receptor Blockers Swelling    Had episode of angioedema on Losartan     Nickel Dermatitis and Itching   Sulfamethoxazole-Trimethoprim Itching, Swelling and Other (See Comments)    Purple bruise   Amlodipine  Other (See Comments)    Leg edema and bradycardia per documentation   Crestor  [Rosuvastatin  Calcium ]     Muscle pains    Social History:   Social History   Socioeconomic History   Marital status: Divorced    Spouse name: Not on file   Number of children: 2   Years of education: Not on file   Highest education level: GED or equivalent  Occupational History   Not on file  Tobacco Use   Smoking status: Never    Passive exposure: Never   Smokeless tobacco: Never  Vaping Use   Vaping status: Never Used  Substance and Sexual Activity   Alcohol use: No    Alcohol/week: 0.0 standard drinks of alcohol   Drug use: No   Sexual activity: Not Currently  Other Topics Concern   Not on file  Social History Narrative   Not on file   Social  Drivers of Health   Financial Resource Strain: Low Risk  (06/01/2023)   Overall Financial Resource Strain (CARDIA)    Difficulty of Paying Living Expenses: Not hard at all  Food Insecurity: Food Insecurity Present (08/03/2023)   Hunger Vital Sign    Worried About Running Out of Food in the Last Year: Sometimes true    Ran Out of Food in the Last Year: Never true  Transportation Needs: No Transportation Needs (08/03/2023)   PRAPARE - Administrator, Civil Service (Medical): No    Lack of Transportation (Non-Medical): No  Physical Activity: Sufficiently Active (06/01/2023)   Exercise Vital Sign    Days of Exercise per Week: 7 days    Minutes of Exercise per Session: 110 min  Stress: No Stress Concern Present (06/01/2023)   [employer] of Occupational Health - Occupational Stress  Questionnaire    Feeling of Stress : Not at all  Social Connections: Socially Isolated (06/01/2023)   Social Connection and Isolation Panel [NHANES]    Frequency of Communication with Friends and Family: Once a week    Frequency of Social Gatherings with Friends and Family: Once a week    Attends Religious Services: 1 to 4 times per year    Active Member of [employer] or Organizations: No    Attends Engineer, structural: Not on file    Marital Status: Divorced  Intimate Partner Violence: Not At Risk (08/03/2023)   Humiliation, Afraid, Rape, and Kick questionnaire    Fear of Current or Ex-Partner: No    Emotionally Abused: No    Physically Abused: No    Sexually Abused: No    Family History:   Family History  Problem Relation Age of Onset   Diabetes Mother    Heart disease Mother    Hypertension Father    Diabetes Sister    Kidney disease Sister    Hypertension Brother    Kidney disease Brother    Arthritis Brother    Cancer Maternal Grandmother    Diabetes Sister    Hypertension Sister    Hypertension Sister    Hypertension Sister    Hypertension Brother    Mental illness Brother     Breast cancer Niece    Colon cancer Neg Hx      ROS:  Please see the history of present illness.  All other ROS reviewed and negative.     Physical Exam/Data:   Vitals:   09/18/23 1208 09/18/23 1210 09/18/23 1320  BP:  (!) 157/68 (!) 161/68  Pulse:  (!) 47 (!) 45  Resp:  19 20  Temp:  99.1 F (37.3 C)   SpO2:  100% 100%  Weight: 93 kg    Height: 5\' 8"  (1.727 m)     No intake or output data in the 24 hours ending 09/18/23 1419    09/18/2023   12:08 PM 09/01/2023   10:31 AM 08/15/2023   10:47 AM  Last 3 Weights  Weight (lbs) 205 lb 208 lb 211 lb 6.4 oz  Weight (kg) 92.987 kg 94.348 kg 95.89 kg     Body mass index is 31.17 kg/m.  General:  Well nourished, well developed, in no acute distress HEENT: normal Neck: no JVD Vascular: No carotid bruits; Distal pulses 2+ bilaterally Cardiac:  normal S1, S2; RRR; no murmur  Lungs:  clear to auscultation bilaterally, no wheezing, rhonchi or rales  Abd: soft, nontender, no hepatomegaly  Ext: no edema Musculoskeletal:  No deformities, BUE and BLE strength normal and equal Skin: warm and dry  Neuro:  CNs 2-12 intact, no focal abnormalities noted Psych:  Normal affect   EKG:  The EKG was personally reviewed and demonstrates: Sinus bradycardia, heart rate 48.  No acute ST-T wave changes. Telemetry:  Telemetry was personally reviewed and demonstrates: Sinus bradycardia heart rate between 45-70.  Relevant CV Studies: Echocardiogram 08/03/2023 1. Left ventricular ejection fraction, by estimation, is 60 to 65%. The  left ventricle has normal function. The left ventricle has no regional  wall motion abnormalities. There is mild left ventricular hypertrophy.  Left ventricular diastolic function  could not be evaluated.   2. Right ventricular systolic function was not well visualized. The right  ventricular size is not well visualized.   3. The mitral valve is grossly normal. No evidence of mitral valve  regurgitation.   4.  The aortic  valve is tricuspid. Aortic valve regurgitation is not  visualized.   5. Rhythm strip during this exam demonstrates atrial fibrillation and  with RVR.   Comparison(s): No prior Echocardiogram.   Heart monitor 09/05/2023 Patch Wear Time:  3 days and 6 hours (2025-05-09T10:10:27-0400 to 2025-05-12T16:50:19-0400)   Patient had a min HR of 33 bpm, max HR of 142 bpm, and avg HR of 56 bpm. Predominant underlying rhythm was Sinus Rhythm. Isolated SVEs were occasional (1.3%, 3216), and no SVE Couplets or SVE Triplets were present. Isolated VEs were rare (<1.0%), and no  VE Couplets or VE Triplets were present. MD notification criteria for Symptomatic Bradycardia met - report posted prior to notification (SM).  Laboratory Data:  High Sensitivity Troponin:   Recent Labs  Lab 09/18/23 1212  TROPONINIHS 5     Chemistry Recent Labs  Lab 09/18/23 1212  NA 140  K 3.7  CL 106  CO2 24  GLUCOSE 118*  BUN 8  CREATININE 0.81  CALCIUM  8.9  GFRNONAA >60  ANIONGAP 10    No results for input(s): "PROT", "ALBUMIN", "AST", "ALT", "ALKPHOS", "BILITOT" in the last 168 hours. Lipids No results for input(s): "CHOL", "TRIG", "HDL", "LABVLDL", "LDLCALC", "CHOLHDL" in the last 168 hours.  Hematology Recent Labs  Lab 09/18/23 1212  WBC 4.4  RBC 4.36  HGB 11.8*  HCT 35.2*  MCV 80.7  MCH 27.1  MCHC 33.5  RDW 13.9  PLT 247   Thyroid  No results for input(s): "TSH", "FREET4" in the last 168 hours.  BNPNo results for input(s): "BNP", "PROBNP" in the last 168 hours.  DDimer No results for input(s): "DDIMER" in the last 168 hours.   Radiology/Studies:  No results found.   Assessment and Plan:   Sinus bradycardia Overall symptoms appear to be mild to moderate with occasional periods of dizziness, lightheadedness that do not always correlate to low heart rates.  Previous heart monitor showed an average heart rate of 56, heart rates here between 45-70 with appropriate chronotropic response.  She has  no evidence of high degree AV blocks or significant conduction disease.  Her current symptoms and heart rate I do not think necessitate PPM.  Nor would she want 1.  She has not been on a beta-blocker either for the past 1 month.  I think a lot of her symptoms coincide with her sleep apnea and other secondary causes but are not driven by slightly low heart rates.  She would be okay with discharge. Normal TSH 1 month ago Avoid AV nodal blocking agents Also reports only getting about 3 to 4 hours of sleep with suspicion of underlying OSA.  Treatment of this likely will help the above.  Paroxysmal atrial fibrillation Recently diagnosed April 2025, converted off of IV diltiazem .  Maintaining sinus here. Continue with Xarelto  20 mg Has appointment with EP to discuss possible ablation Also has pending sleep study for possible OSA  Neck and shoulder pain I do not think the symptoms correlate with underlying CAD, more suspicious for MSK related pain.  EKG with no acute ST-T wave changes.  First troponin negative.  Given family history though would consider coronary CT outpatient should this continue to be an issue  Hypertension She has elevated readings here 150s and 160s, reports at home around 130s.  Has history of angioedema on ARB.  Lower extremity swelling on amlodipine .  She sees her PCP for this next month. Continue with spironolactone  25 mg  HLD LDL 59 6 months ago.  Previously referred to lipid clinic given myalgias on statin before, although I do not see she was ever seen for this?  We can follow this outpatient.  She has appointment with Dr. Marven Slimmer 5/22.  Risk Assessment/Risk Scores:   CHA2DS2-VASc Score = 3   This indicates a 3.2% annual risk of stroke. The patient's score is based upon: CHF History: 0 HTN History: 1 Diabetes History: 1 Stroke History: 0 Vascular Disease History: 0 Age Score: 0 Gender Score: 1      For questions or updates, please contact [facility] Please consult [email or web address] for contact info under    Signed, Burnetta Cart, PA-C  09/18/2023 2:19 PM

## 2023-09-18 NOTE — Telephone Encounter (Signed)
Addressed in another message. Closing this encounter.

## 2023-09-18 NOTE — Telephone Encounter (Signed)
-  Pt states "I had a bad day that day. I was dizzy and stayed in the bed all day that day." -Pt reports being nauseous, having a dull ache in left shoulder up in to left side of neck currently. Pt advised to do to ED to be evaluated.  -She verbalized understanding and will go be evaluated today.  -She will keep her appt Thursday with Dr. Marven Slimmer.  -Spoke with Dr. Loetta Ringer agreeable with plan.

## 2023-09-18 NOTE — Telephone Encounter (Signed)
Follow Up:      Patient is returning call from today. 

## 2023-09-18 NOTE — Telephone Encounter (Signed)
 Caller Tawny Fate) reporting out of range results.

## 2023-09-19 ENCOUNTER — Ambulatory Visit (HOSPITAL_BASED_OUTPATIENT_CLINIC_OR_DEPARTMENT_OTHER): Payer: Self-pay | Admitting: Family

## 2023-09-21 ENCOUNTER — Encounter: Payer: Self-pay | Admitting: Cardiology

## 2023-09-21 ENCOUNTER — Other Ambulatory Visit: Payer: Self-pay

## 2023-09-21 ENCOUNTER — Ambulatory Visit: Attending: Cardiology | Admitting: Cardiology

## 2023-09-21 VITALS — BP 182/84 | HR 39 | Ht 68.0 in | Wt 208.0 lb

## 2023-09-21 DIAGNOSIS — I10 Essential (primary) hypertension: Secondary | ICD-10-CM | POA: Diagnosis not present

## 2023-09-21 DIAGNOSIS — R9431 Abnormal electrocardiogram [ECG] [EKG]: Secondary | ICD-10-CM

## 2023-09-21 DIAGNOSIS — I480 Paroxysmal atrial fibrillation: Secondary | ICD-10-CM

## 2023-09-21 DIAGNOSIS — I517 Cardiomegaly: Secondary | ICD-10-CM | POA: Diagnosis not present

## 2023-09-21 MED ORDER — HYDROCHLOROTHIAZIDE 12.5 MG PO CAPS: 12.5000 mg | ORAL_CAPSULE | Freq: Every day | ORAL | 3 refills | Status: DC

## 2023-09-21 NOTE — Progress Notes (Signed)
 Electrophysiology Office Note:    Date:  09/21/2023   ID:  [name], [name] 1965-12-02, MRN [id number]  [facility] Cardiologist:  Veronica Donning, MD  [facility] Electrophysiologist:  Veronica Byes, MD   Referring MD: Veronica Curia, NP   Chief Complaint: Atrial fibrillation  History of Present Illness:    [name] is a 58 year old woman who I am seeing today for an evaluation of atrial fibrillation at the request of Veronica Banks, NP.  The patient has a history of hypertension, diabetes, atrial fibrillation.  She was admitted in April of this year with new onset atrial fibrillation with rapid ventricular rate.  Beta-blockers were started but had to be down titrated due to bradycardia.  She is on Xarelto  for stroke prophylaxis.  She has normal left ventricular function.  She is referred to discuss catheter ablation of her atrial fibrillation.  Her mother passed at 7yo from MI. Her brother died from HF at age 6. Her other brother died of HF at 70. She has noted a progressive slowing of the HR.    Their past medical, social and family history was reviewed.   ROS:   Please see the history of present illness.    All other systems reviewed and are negative.  EKGs/Labs/Other Studies Reviewed:    The following studies were reviewed today:  Sep 18, 2023 ZIO monitor Heart rate 33-1 42, average 56 No atrial fibrillation Occasional supraventricular ectopy, 1.3% Rare ventricular ectopy  August 03, 2023 echo EF 60% RV Nishan not well-visualized No significant valvular abnormalities  Sep 18, 2023 EKG shows sinus bradycardia, ventricular rate 48 bpm.  No preexcitation.  Normal intervals.  August 03, 2023 EKG shows atrial fibrillation with a rapid ventricular rate, 150 bpm EKG Interpretation Date/Time:  Thursday Sep 21 2023 11:10:37 EDT Ventricular Rate:  39 PR Interval:  136 QRS Duration:  86 QT Interval:  458 QTC Calculation: 368 R  Axis:   14  Text Interpretation: Marked sinus bradycardia Confirmed by [name] [phone]) on 09/21/2023 11:26:08 AM    Physical Exam:    VS:  BP (!) 182/84   Pulse (!) 39   Ht 5\' 8"  (1.727 m)   Wt 208 lb (94.3 kg)   SpO2 99%   BMI 31.63 kg/m     Recheck confirmed BP 182/76  Wt Readings from Last 3 Encounters:  09/21/23 208 lb (94.3 kg)  09/18/23 205 lb (93 kg)  09/01/23 208 lb (94.3 kg)     GEN: no distress CARD: RRR, No MRG RESP: No IWOB. CTAB.  With walking down the hall, the HR increased to 80 bpm.      ASSESSMENT AND PLAN:    1. PAF (paroxysmal atrial fibrillation) (HCC)   2. Left ventricular hypertrophy   3. Abnormal electrocardiogram (ECG) (EKG)   4. Essential hypertension     #Atrial fibrillation Symptomatic.  Led to ER visit.  Has not tolerated higher dose beta-blocker due to symptomatic bradycardia.  I discussed treatment options today for her atrial fibrillation including conservative therapy, antiarrhythmic drugs and catheter ablation.  Her bradycardia is going to limit our ability to uptitrate beta-blockers or use antiarrhythmic drugs.  I discussed catheter ablation in detail today including the risks, recovery and likelihood of success.  She is interested in proceeding.  Discussed treatment options today for AF including antiarrhythmic drug therapy and ablation. Discussed risks, recovery and likelihood of success with each treatment strategy. Risk, benefits, and alternatives to EP study and ablation  for afib were discussed. These risks include but are not limited to stroke, bleeding, vascular damage, tamponade, perforation, damage to the esophagus, lungs, phrenic nerve and other structures, pulmonary vein stenosis, worsening renal function, coronary vasospasm and death.  Discussed potential need for repeat ablation procedures and antiarrhythmic drugs after an initial ablation. The patient understands these risk and wishes to proceed.  We will therefore  proceed with catheter ablation at the next available time.  Carto, ICE, anesthesia are requested for the procedure.  Will also obtain CT PV protocol prior to the procedure to exclude LAA thrombus and further evaluate atrial anatomy.  Continue Xarelto  for stroke prophylaxis  #Hypertension Above goal today.  Recommend checking blood pressures 1-2 times per week at home and recording the values.  Recommend bringing these recordings to the primary care physician. Start hydrochlorothiazide 12.5mg  PO daily. Needs to see pharmD clinic in 3-5 days for further titration/monitoring of her K given past hypokalemia.  #Bradycardia Progressively worsening sinus node dysfunction. Given her HF family history and her progressive/significant bradycardia, I am concerned she could have amyloid. I am goin to order the PYP scan and blood work. We discussed pacing but with her HR during walking, I do not think it is currently indicated.   Signed, [name]. Veronica Slimmer, MD, [facility], [facility] 09/21/2023 11:53 AM    Electrophysiology [facility]

## 2023-09-21 NOTE — Addendum Note (Signed)
 Addended by: CHAUVIGNE, Kaoru Rezendes on: 09/21/2023 11:57 AM   Modules accepted: Orders

## 2023-09-21 NOTE — Patient Instructions (Addendum)
 Medication Instructions:  Your physician has recommended you make the following change in your medication:  1) START taking hydrochlorothiazide 12.5 mg once daily  *If you need a refill on your cardiac medications before your next appointment, please call your pharmacy*  Lab Work: TODAY: SPEP and UPEP  PRE-PROCEDURE LABS (WEEK OF JULY 14th): BMET and CBC  Testing/Procedures: PYP Scan   Cardiac CT Your physician has requested that you have cardiac CT. Cardiac computed tomography (CT) is a painless test that uses an x-ray machine to take clear, detailed pictures of your heart. For further information please visit [email or web address]  We will call you to schedule your CT scan. It will be done about three weeks prior to your ablation.  Ablation Your physician has recommended that you have an ablation. Catheter ablation is a medical procedure used to treat some cardiac arrhythmias (irregular heartbeats). During catheter ablation, a long, thin, flexible tube is put into a blood vessel in your groin (upper thigh), or neck. This tube is called an ablation catheter. It is then guided to your heart through the blood vessel. Radio frequency waves destroy small areas of heart tissue where abnormal heartbeats may cause an arrhythmia to start.  You are scheduled for Atrial Fibrillation Ablation on Wednesday, August 13 with Dr. Harvie Liner.Please arrive at the Main Entrance A at [facility]: [address] [place], [place] [address] at 6:30 AM    Follow-Up: At [facility], you and your health needs are our priority.  As part of our continuing mission to provide you with exceptional heart care, we have created designated Provider Care Teams.  These Care Teams include your primary Cardiologist (physician) and Advanced Practice Providers (APPs -  Physician Assistants and Nurse Practitioners) who all work together to provide you with the care you need, when you need it.   Your next  appointment:   We will contact you about your post-procedure follow up appointments.   You have been referred to our PharmD Hypertension Clinic

## 2023-09-22 ENCOUNTER — Telehealth: Payer: Self-pay | Admitting: Pharmacist

## 2023-09-22 NOTE — Telephone Encounter (Signed)
 Call patient to reschedule apt. Pt states she is off that Friday so will be abel to come to that 11:30 apt on Fri May 30,2025.

## 2023-09-22 NOTE — Telephone Encounter (Signed)
-----   Message from Nurse Allayne Islam sent at 09/21/2023  5:33 PM EDT ----- Hey! Can it be later in the afternoon, she said that she is working that morning and can't get off.  Thanks! Carly ----- Message ----- From: Cathalene Clipper, [facility] Sent: 09/21/2023   1:13 PM EDT To: Carlyle Chauvigne, RN  I put her on my schedule for May 30 at 11:30. Let me know if that dose not work for patient. I can accommodate her some other day ----- Message ----- From: Chauvigne, Carlyle, RN Sent: 09/21/2023  12:22 PM EDT To: C[facility] Pharmd  Good Morning,  Patient was seen today by Dr. Marven Slimmer - he is requesting a visit with PharmD for hypertension next week. Is there anywhere that you all could fit her in?  Thanks! Carly

## 2023-09-26 ENCOUNTER — Other Ambulatory Visit: Payer: Self-pay | Admitting: *Deleted

## 2023-09-26 DIAGNOSIS — I517 Cardiomegaly: Secondary | ICD-10-CM

## 2023-09-26 NOTE — Telephone Encounter (Signed)
 Back pain would not be side effect of Xarelto . Would recommend evaluation of back pain with primary care provider.   Veronica Berrie S Ladarrell Cornwall, NP

## 2023-09-26 NOTE — Telephone Encounter (Signed)
 Please review and advise.

## 2023-09-27 LAB — BASIC METABOLIC PANEL WITH GFR
BUN/Creatinine Ratio: 16 (ref 9–23)
BUN: 15 mg/dL (ref 6–24)
CO2: 21 mmol/L (ref 20–29)
Calcium: 10.5 mg/dL — ABNORMAL HIGH (ref 8.7–10.2)
Chloride: 101 mmol/L (ref 96–106)
Creatinine, Ser: 0.91 mg/dL (ref 0.57–1.00)
Glucose: 82 mg/dL (ref 70–99)
Potassium: 4 mmol/L (ref 3.5–5.2)
Sodium: 139 mmol/L (ref 134–144)
eGFR: 73 mL/min/{1.73_m2} (ref >59)

## 2023-09-27 LAB — CBC
Hematocrit: 40.6 % (ref 34.0–46.6)
Hemoglobin: 13.2 g/dL (ref 11.1–15.9)
MCH: 27 pg (ref 26.6–33.0)
MCHC: 32.5 g/dL (ref 31.5–35.7)
MCV: 83 fL (ref 79–97)
Platelets: 296 10*3/uL (ref 150–450)
RBC: 4.88 x10E6/uL (ref 3.77–5.28)
RDW: 14 % (ref 11.7–15.4)
WBC: 6 10*3/uL (ref 3.4–10.8)

## 2023-09-27 LAB — PROTEIN ELECTROPHORESIS, URINE REFLEX

## 2023-09-28 LAB — PROTEIN ELECTROPHORESIS, URINE REFLEX

## 2023-09-28 LAB — PROTEIN ELECTROPHORESIS, SERUM
A/G Ratio: 1 (ref 0.7–1.7)
Albumin ELP: 3.9 g/dL (ref 2.9–4.4)
Alpha 1: 0.2 g/dL (ref 0.0–0.4)
Alpha 2: 0.7 g/dL (ref 0.4–1.0)
Beta: 1.6 g/dL — ABNORMAL HIGH (ref 0.7–1.3)
Gamma Globulin: 1.6 g/dL (ref 0.4–1.8)
Globulin, Total: 4.1 g/dL — ABNORMAL HIGH (ref 2.2–3.9)
Total Protein: 8 g/dL (ref 6.0–8.5)

## 2023-09-29 ENCOUNTER — Ambulatory Visit: Attending: Cardiology | Admitting: Pharmacist

## 2023-09-29 ENCOUNTER — Encounter: Payer: Self-pay | Admitting: Pharmacist

## 2023-09-29 VITALS — BP 130/84 | HR 48

## 2023-09-29 DIAGNOSIS — I10 Essential (primary) hypertension: Secondary | ICD-10-CM | POA: Diagnosis not present

## 2023-09-29 NOTE — Assessment & Plan Note (Signed)
 Assessment: In office 1st BP 146/84 heart rate 48 upon rechecking it went to 130/84  Hoem BP ~132/80 - recalls from memory  Tolerates current BP meds well without any side effects  Denies SOB, palpitation, chest pain, headaches,or swelling Reiterated the importance of regular exercise and low salt diet  Proper blood pressure measurement technique was reviewed, and she was encouraged to switch from a wrist cuff to an upper arm cuff for more accurate readings.  Plan:  No medication change  Continue taking hydrochlorothiazide  12.5 mg daily and spironolactone  25 mg daily  Patient to keep record of BP readings with heart rate and report to us  at the next visit Patient to see PharmD in 4 weeks for follow up  Follow up lab(s): none

## 2023-09-29 NOTE — Progress Notes (Signed)
 Patient ID: [name]                 DOB: July 22, 1965                      MRN: [id number]      HPI: [name] is a 58 y.o. female referred by Dr. Marven Slimmer  to HTN clinic. PMH is significant for HTN, DM2, atrial fibrillation. Admitted 4/3 - 08/04/2023 with new onset A-fib RVR.  Metoprolol  decreased to 25 mg daily due to bradycardia.  Xarelto  20 mg daily initiated.  Echo LVEF 60 to 65%, mild LVH.  Indapamide  spironolactone  held due to normotension. At visit with Veronica Walker, NP metoprolol  dose was further reduced to 12.5 mg due to bradycardia and spironolactone  25 mg  was continued    Patient saw Dr.Lambert on 09/21/23 for an evaluation of atrial fibrillation.  Scheduled for Atrial Fibrillation Ablation on Wednesday, August 13 with Dr. Marven Slimmer. Pt's BP was elevated so low dose hydrochlorothiazide was initiated.   The patient presented for a blood pressure follow-up and reports tolerating hydrochlorothiazide well. She noted that her left ankle, previously affected by a fusion, no longer swells since starting the new diuretic. Recent BMP was within normal limits. Home blood pressure readings average around 132/80 mmHg, with her heart rate consistently in the low 50s. She acknowledges the need to improve her sodium intake. Living alone, she currently eats out for most meals but is now willing to start cooking at home without added salt. We discussed hidden sources of sodium and strategies to avoid them, and the patient is in agreement with making dietary changes. Proper blood pressure measurement technique was reviewed, and she was encouraged to switch from a wrist cuff to an upper arm cuff for more accurate readings. Current HTN meds: hydrochlorothiazide 12.5 mg daily and spironolactone  25 mg daily  Previously tried: indapamide - low potassium, Toprol  XL >12.5 mg - bradycardia, losartan  - angioedema, atenolol- extreme tiredness, amlodipine  2.5 mg - ankle swelling  BP goal: <130/80    CrCl 80 mL/min     Family History:  Relation Problem Comments  Mother (Deceased) Diabetes   Heart disease     Father (Deceased) Hypertension     Sister (Deceased) Diabetes   Kidney disease     Sister - Sales executive Metallurgist) Diabetes     Sister - Careers information officer (Alive) Hypertension     Sister - Primary school teacher (Alive) Hypertension     Sister - Web designer (Alive) Hypertension     Brother - greg Metallurgist) Arthritis   Hypertension   Kidney disease     Brother - timothy (Alive) Hypertension   Mental illness     Maternal Grandmother (Deceased)      Social History:  Alcohol: none Smoking: never  Recreational drug use: none   Diet: fast food -eats out  Sandwich in the morning and supper  [place] chicken salad,  Cookies and cakes, chips -sometimes  Drink- water, slush- once a month or cheer wine - once a month   Exercise:  None    Home BP readings: recalls only one reading    Wt Readings from Last 3 Encounters:  09/21/23 208 lb (94.3 kg)  09/18/23 205 lb (93 kg)  09/01/23 208 lb (94.3 kg)   BP Readings from Last 3 Encounters:  09/29/23 130/84  09/21/23 (!) 182/84  09/18/23 (!) 141/46   Pulse Readings from Last 3 Encounters:  09/29/23 (!) 48  09/21/23 (!) 39  09/18/23 (!)  47    Renal function: Estimated Creatinine Clearance: 80 mL/min (by C-G formula based on SCr of 0.91 mg/dL).  Past Medical History:  Diagnosis Date   A-fib (HCC)    Allergy     Arthritis    Lt ankle   Chronic kidney disease    right renal cyst    GERD (gastroesophageal reflux disease)    hx of no problems at present    Headache(784.0)    occasional headache    Hypertension    Influenza 04/20/2012   PONV (postoperative nausea and vomiting)    N/V and trouble urinating after surgery     Current Outpatient Medications on File Prior to Visit  Medication Sig Dispense Refill   acetaminophen  (TYLENOL ) 500 MG tablet Take 500 mg by mouth every 6 (six) hours as needed for moderate pain (pain score 4-6).      cholecalciferol  (VITAMIN D3) 25 MCG (1000 UNIT) tablet Take 1,000 Units by mouth daily.     hydrochlorothiazide  (MICROZIDE ) 12.5 MG capsule Take 1 capsule (12.5 mg total) by mouth daily. 90 capsule 3   meclizine  (ANTIVERT ) 25 MG tablet Take 1 tablet (25 mg total) by mouth 3 (three) times daily as needed for dizziness. 30 tablet 0   metoprolol  succinate (TOPROL -XL) 25 MG 24 hr tablet Take 0.5 tablets (12.5 mg total) by mouth daily. (Patient not taking: Reported on 09/01/2023) 45 tablet 3   rivaroxaban  (XARELTO ) 20 MG TABS tablet Take 1 tablet (20 mg total) by mouth daily with supper. 90 tablet 3   spironolactone  (ALDACTONE ) 25 MG tablet TAKE 1 TABLET BY MOUTH EVERY DAY 90 tablet 1   No current facility-administered medications on file prior to visit.    Allergies  Allergen Reactions   Tetracycline Hcl Shortness Of Breath, Swelling and Other (See Comments)    Large purple bruise   Angiotensin Receptor Blockers Swelling    Had episode of angioedema on Losartan     Nickel Dermatitis and Itching   Sulfamethoxazole-Trimethoprim Itching, Swelling and Other (See Comments)    Purple bruise   Amlodipine  Other (See Comments)    Leg edema and bradycardia per documentation   Crestor  [Rosuvastatin  Calcium ]     Muscle pains    Blood pressure 130/84, pulse (!) 48, SpO2 97%.   Assessment/Plan:  1. Hypertension -  Essential hypertension, benign Assessment: In office 1st BP 146/84 heart rate 48 upon rechecking it went to 130/84  Hoem BP ~132/80 - recalls from memory  Tolerates current BP meds well without any side effects  Denies SOB, palpitation, chest pain, headaches,or swelling Reiterated the importance of regular exercise and low salt diet  Proper blood pressure measurement technique was reviewed, and she was encouraged to switch from a wrist cuff to an upper arm cuff for more accurate readings.  Plan:  No medication change  Continue taking hydrochlorothiazide  12.5 mg daily and spironolactone   25 mg daily  Patient to keep record of BP readings with heart rate and report to us  at the next visit Patient to see PharmD in 4 weeks for follow up  Follow up lab(s): none       Thank you  Nickola Baron, Pharm.D [place] [facility] [address], [place], [place] [address] Phone: [phone]; Fax: [phone]

## 2023-09-29 NOTE — Patient Instructions (Addendum)
 No Changes made by your pharmacist Nickola Baron, PharmD at today's visit:   Bring all of your meds, your BP cuff and your record of home blood pressures to your next appointment.    HOW TO TAKE YOUR BLOOD PRESSURE AT HOME  Rest 5 minutes before taking your blood pressure.  Don't smoke or drink caffeinated beverages for at least 30 minutes before. Take your blood pressure before (not after) you eat. Sit comfortably with your back supported and both feet on the floor (don't cross your legs). Elevate your arm to heart level on a table or a desk. Use the proper sized cuff. It should fit smoothly and snugly around your bare upper arm. There should be enough room to slip a fingertip under the cuff. The bottom edge of the cuff should be 1 inch above the crease of the elbow. Ideally, take 3 measurements at one sitting and record the average.  Important lifestyle changes to control high blood pressure  Intervention  Effect on the BP  Lose extra pounds and watch your waistline Weight loss is one of the most effective lifestyle changes for controlling blood pressure. If you're overweight or obese, losing even a small amount of weight can help reduce blood pressure. Blood pressure might go down by about 1 millimeter of mercury (mm Hg) with each kilogram (about 2.2 pounds) of weight lost.  Exercise regularly As a general goal, aim for at least 30 minutes of moderate physical activity every day. Regular physical activity can lower high blood pressure by about 5 to 8 mm Hg.  Eat a healthy diet Eating a diet rich in whole grains, fruits, vegetables, and low-fat dairy products and low in saturated fat and cholesterol. A healthy diet can lower high blood pressure by up to 11 mm Hg.  Reduce salt (sodium) in your diet Even a small reduction of sodium in the diet can improve heart health and reduce high blood pressure by about 5 to 6 mm Hg.  Limit alcohol One drink equals 12 ounces of beer, 5 ounces of wine,  or 1.5 ounces of 80-proof liquor.  Limiting alcohol to less than one drink a day for women or two drinks a day for men can help lower blood pressure by about 4 mm Hg.   If you have any questions or concerns please use My Chart to send questions or call the office at [phone]

## 2023-10-06 ENCOUNTER — Ambulatory Visit: Admitting: Family Medicine

## 2023-10-06 ENCOUNTER — Encounter: Payer: Self-pay | Admitting: Family Medicine

## 2023-10-06 DIAGNOSIS — I10 Essential (primary) hypertension: Secondary | ICD-10-CM | POA: Diagnosis not present

## 2023-10-06 DIAGNOSIS — E119 Type 2 diabetes mellitus without complications: Secondary | ICD-10-CM | POA: Diagnosis not present

## 2023-10-06 MED ORDER — MOUNJARO 2.5 MG/0.5ML ~~LOC~~ SOAJ: 2.5000 mg | SUBCUTANEOUS | 0 refills | Status: DC

## 2023-10-06 NOTE — Patient Instructions (Addendum)
 It was nice seeing you today. Please take you medication as soon as you return home and slowly transition to taking your medications at 6 pm from 10 am. You can start by taking your medication at noon today and then at 2 pm the next day, till you are at 6pm. I will see you back in 2 weeks.  Please call ophthalmology for diabetic eye exam. See the number below.   Referral for: [name] [address]  [place] [address]  [phone]   Referral sent to [facility] [address]

## 2023-10-06 NOTE — Assessment & Plan Note (Addendum)
 Holding Coreg due to Bradycardia Repeat BP improved Since she did not take her meds today and her home BP are reported normal, we will not adjust her regimen today Continue hydrochlorothiazide  12.5 mg every day F/U in 2 weeks for reassessment She agreed with the plan

## 2023-10-06 NOTE — Progress Notes (Signed)
    SUBJECTIVE:   CHIEF COMPLAINT / HPI:   Throat pain: The patient stated that she developed throat pain 4 days ago while she was out in the yard mowing her lawn. She felt something got into her throat then. Since then, she used Tylenol  as needed for pain, and her pain has improved a lot. She denies any sick contact or other respiratory symptoms.   HTN: She has yet to take her blood pressure medication this morning. Her blood pressure is mainly in the 130s systolic and 80 diastolic, she says. Sometimes, it increases when she exerts herself but improves after rest.  Hypercalcemia: No concerns today. Need lab follow-up.  DM2: Wants to trial GLP1 which will also help with weight management  PERTINENT  PMH / PSH: PMHx reviewed  OBJECTIVE:   BP (!) 169/70   Pulse (!) 50   Ht 5\' 8"  (1.727 m)   Wt 204 lb 12.8 oz (92.9 kg)   SpO2 100%   BMI 31.14 kg/m   Physical Exam Vitals and nursing note reviewed.  HENT:     Mouth/Throat:     Mouth: Mucous membranes are moist.     Pharynx: Oropharynx is clear. No oropharyngeal exudate or posterior oropharyngeal erythema.  Eyes:     Conjunctiva/sclera: Conjunctivae normal.  Cardiovascular:     Rate and Rhythm: Bradycardia present. Rhythm irregular.     Heart sounds: No murmur heard. Pulmonary:     Effort: Pulmonary effort is normal. No respiratory distress.     Breath sounds: Normal breath sounds.      ASSESSMENT/PLAN:   Assessment & Plan Hypercalcemia Rechecked Bmet today  Throat pain: Likely allergy  related Hx of pollen allergy  May use OTC antihistamine Continue tylenol  as needed Monitor for complete resolution or return soon if symptoms persists Essential hypertension, benign Holding Coreg due to Bradycardia Repeat BP improved Since she did not take her meds today and her home BP are reported normal, we will not adjust her regimen today Continue hydrochlorothiazide  12.5 mg every day F/U in 2 weeks for reassessment She  agreed with the plan Type 2 diabetes mellitus without complication, without long-term current use of insulin (HCC) A1C in the 6.2- 6.6 range Wants to trial GLP1 which also has weight loss benefit Counseling provided She denies personal or family hx of any form of thyroid  cancer Medication S/E discussed Start Mounjaro 2.5 mg weekly F/U in 2 weeks for reassessment     Penni Bowman, MD [facility]

## 2023-10-06 NOTE — Assessment & Plan Note (Addendum)
 A1C in the 6.2- 6.6 range Wants to trial GLP1 which also has weight loss benefit Counseling provided She denies personal or family hx of any form of thyroid  cancer Medication S/E discussed Start Mounjaro 2.5 mg weekly F/U in 2 weeks for reassessment

## 2023-10-06 NOTE — Assessment & Plan Note (Signed)
 Rechecked Bmet today  Throat pain: Likely allergy  related Hx of pollen allergy  May use OTC antihistamine Continue tylenol  as needed Monitor for complete resolution or return soon if symptoms persists

## 2023-10-07 ENCOUNTER — Ambulatory Visit: Payer: Self-pay | Admitting: Family Medicine

## 2023-10-07 LAB — BASIC METABOLIC PANEL WITH GFR
BUN/Creatinine Ratio: 15 (ref 9–23)
BUN: 11 mg/dL (ref 6–24)
CO2: 23 mmol/L (ref 20–29)
Calcium: 9.5 mg/dL (ref 8.7–10.2)
Chloride: 104 mmol/L (ref 96–106)
Creatinine, Ser: 0.71 mg/dL (ref 0.57–1.00)
Glucose: 98 mg/dL (ref 70–99)
Potassium: 3.5 mmol/L (ref 3.5–5.2)
Sodium: 141 mmol/L (ref 134–144)
eGFR: 98 mL/min/{1.73_m2} (ref >59)

## 2023-10-09 ENCOUNTER — Telehealth: Payer: Self-pay

## 2023-10-09 NOTE — Telephone Encounter (Signed)
 Pharmacy Patient Advocate Encounter   Received notification from CoverMyMeds that prior authorization for MOUNJARO  2.5MG  is required/requested.   Insurance verification completed.   The patient is insured through [facility] .   PA required; PA submitted to above mentioned insurance via CoverMyMeds Key/confirmation #/EOC [id number]. Status is pending

## 2023-10-17 ENCOUNTER — Ambulatory Visit: Payer: Self-pay

## 2023-10-23 NOTE — Telephone Encounter (Signed)
 Pharmacy Patient Advocate Encounter  Received notification from [employer] [place]  that Prior Authorization for MOUNJARO  2.5MG  has been DENIED.  Full denial letter will be uploaded to the media tab. See denial reason below.    PA #/Case ID/Reference #: [id number]

## 2023-10-24 ENCOUNTER — Encounter (HOSPITAL_COMMUNITY): Payer: Self-pay

## 2023-10-24 ENCOUNTER — Other Ambulatory Visit: Payer: Self-pay

## 2023-10-24 ENCOUNTER — Emergency Department (HOSPITAL_COMMUNITY)

## 2023-10-24 ENCOUNTER — Emergency Department (HOSPITAL_COMMUNITY)
Admission: EM | Admit: 2023-10-24 | Discharge: 2023-10-24 | Disposition: A | Discharge status: Home / Self Care | Attending: Emergency Medicine | Admitting: Emergency Medicine

## 2023-10-24 DIAGNOSIS — I129 Hypertensive chronic kidney disease with stage 1 through stage 4 chronic kidney disease, or unspecified chronic kidney disease: Secondary | ICD-10-CM | POA: Diagnosis not present

## 2023-10-24 DIAGNOSIS — R0602 Shortness of breath: Secondary | ICD-10-CM | POA: Insufficient documentation

## 2023-10-24 DIAGNOSIS — R002 Palpitations: Secondary | ICD-10-CM | POA: Diagnosis not present

## 2023-10-24 DIAGNOSIS — R079 Chest pain, unspecified: Secondary | ICD-10-CM | POA: Insufficient documentation

## 2023-10-24 DIAGNOSIS — Z79899 Other long term (current) drug therapy: Secondary | ICD-10-CM | POA: Diagnosis not present

## 2023-10-24 DIAGNOSIS — S29011A Strain of muscle and tendon of front wall of thorax, initial encounter: Secondary | ICD-10-CM | POA: Diagnosis not present

## 2023-10-24 DIAGNOSIS — T675XXA Heat exhaustion, unspecified, initial encounter: Secondary | ICD-10-CM | POA: Insufficient documentation

## 2023-10-24 DIAGNOSIS — N189 Chronic kidney disease, unspecified: Secondary | ICD-10-CM | POA: Diagnosis not present

## 2023-10-24 DIAGNOSIS — Y990 Civilian activity done for income or pay: Secondary | ICD-10-CM | POA: Insufficient documentation

## 2023-10-24 DIAGNOSIS — X30XXXA Exposure to excessive natural heat, initial encounter: Secondary | ICD-10-CM | POA: Diagnosis not present

## 2023-10-24 DIAGNOSIS — R0789 Other chest pain: Secondary | ICD-10-CM | POA: Diagnosis not present

## 2023-10-24 LAB — TROPONIN I (HIGH SENSITIVITY): Troponin I (High Sensitivity): 4 ng/L (ref <18)

## 2023-10-24 LAB — CBC WITH DIFFERENTIAL/PLATELET
Abs Immature Granulocytes: 0.01 10*3/uL (ref 0.00–0.07)
Basophils Absolute: 0 10*3/uL (ref 0.0–0.1)
Basophils Relative: 1 %
Eosinophils Absolute: 0.1 10*3/uL (ref 0.0–0.5)
Eosinophils Relative: 1 %
HCT: 33.9 % — ABNORMAL LOW (ref 36.0–46.0)
Hemoglobin: 11.6 g/dL — ABNORMAL LOW (ref 12.0–15.0)
Immature Granulocytes: 0 %
Lymphocytes Relative: 42 %
Lymphs Abs: 2 10*3/uL (ref 0.7–4.0)
MCH: 27.2 pg (ref 26.0–34.0)
MCHC: 34.2 g/dL (ref 30.0–36.0)
MCV: 79.6 fL — ABNORMAL LOW (ref 80.0–100.0)
Monocytes Absolute: 0.5 10*3/uL (ref 0.1–1.0)
Monocytes Relative: 9 %
Neutro Abs: 2.2 10*3/uL (ref 1.7–7.7)
Neutrophils Relative %: 47 %
Platelets: 238 10*3/uL (ref 150–400)
RBC: 4.26 MIL/uL (ref 3.87–5.11)
RDW: 13.6 % (ref 11.5–15.5)
WBC: 4.8 10*3/uL (ref 4.0–10.5)
nRBC: 0 % (ref 0.0–0.2)

## 2023-10-24 LAB — BASIC METABOLIC PANEL WITH GFR
Anion gap: 11 (ref 5–15)
BUN: 13 mg/dL (ref 6–20)
CO2: 23 mmol/L (ref 22–32)
Calcium: 9.5 mg/dL (ref 8.9–10.3)
Chloride: 106 mmol/L (ref 98–111)
Creatinine, Ser: 0.86 mg/dL (ref 0.44–1.00)
GFR, Estimated: >60 mL/min (ref >60)
Glucose, Bld: 92 mg/dL (ref 70–99)
Potassium: 3.6 mmol/L (ref 3.5–5.1)
Sodium: 140 mmol/L (ref 135–145)

## 2023-10-24 LAB — MAGNESIUM: Magnesium: 2 mg/dL (ref 1.7–2.4)

## 2023-10-24 NOTE — Discharge Instructions (Addendum)
 Your workup today was reassuring.  Your heart enzyme was reassuring.  You remained without any symptoms while you are in the emergency department.  Follow-up with your primary care doctor and your cardiologist.  Return for any emergent symptoms.  Drink plenty of fluids.

## 2023-10-24 NOTE — ED Provider Triage Note (Signed)
 Emergency Medicine Provider Triage Evaluation Note  [name] , a 58 y.o. female  was evaluated in triage.  Pt complains of chest pain, shortness of breath, palpitations that occurred while at work earlier.  She works in Building services engineer.  Does have history of A-fib and is due to undergo ablation in August. Does feel improved now.  Review of Systems  Positive: As above Negative: As above  Physical Exam  BP (!) 153/74 (BP Location: Right Arm)   Pulse 87   Temp 98.9 F (37.2 C)   Resp 16   Ht 5' 8 (1.727 m)   Wt 92.5 kg   SpO2 93%   BMI 31.02 kg/m  Gen:   Awake, no distress   Resp:  Normal effort  MSK:   Moves extremities without difficulty  Other:    Medical Decision Making  Medically screening exam initiated at 12:55 PM.  Appropriate orders placed.  [name] was informed that the remainder of the evaluation will be completed by another provider, this initial triage assessment does not replace that evaluation, and the importance of remaining in the ED until their evaluation is complete.     Hildegard Loge, PA-C 10/24/23 1256

## 2023-10-24 NOTE — ED Provider Notes (Signed)
 [facility] EMERGENCY DEPARTMENT AT [facility] Provider Note   CSN: [id number] Arrival date & time: 10/24/23  1224     Patient presents with: Heat Exposure   [name] is a 58 y.o. female.   Pt complains of chest pain, shortness of breath, palpitations that occurred while at work earlier.  She works in Building services engineer.  Does have history of A-fib and is due to undergo ablation in August. Does feel improved now.  The history is provided by the patient. No language interpreter was used.       Prior to Admission medications   Medication Sig Start Date End Date Taking? Authorizing Provider  acetaminophen  (TYLENOL ) 500 MG tablet Take 500 mg by mouth every 6 (six) hours as needed for moderate pain (pain score 4-6). Patient not taking: Reported on 10/06/2023    [provider]  cholecalciferol  (VITAMIN D3) 25 MCG (1000 UNIT) tablet Take 1,000 Units by mouth daily. Patient not taking: Reported on 10/06/2023    [provider]  hydrochlorothiazide  (MICROZIDE ) 12.5 MG capsule Take 1 capsule (12.5 mg total) by mouth daily. 09/21/23   Cindie Ole DASEN, MD  meclizine  (ANTIVERT ) 25 MG tablet Take 1 tablet (25 mg total) by mouth 3 (three) times daily as needed for dizziness. Patient not taking: Reported on 10/06/2023 09/18/23   Yolande Lamar BROCKS, MD  rivaroxaban  (XARELTO ) 20 MG TABS tablet Take 1 tablet (20 mg total) by mouth daily with supper. 08/15/23   Walker, Caitlin S, NP  spironolactone  (ALDACTONE ) 25 MG tablet TAKE 1 TABLET BY MOUTH EVERY DAY 09/04/23   Anders Otto DASEN, MD  tirzepatide  (MOUNJARO ) 2.5 MG/0.5ML Pen Inject 2.5 mg into the skin once a week. 10/06/23   Anders Otto DASEN, MD    Allergies: Tetracycline hcl, Angiotensin receptor blockers, Nickel, Sulfamethoxazole-trimethoprim, Amlodipine , and Crestor  [rosuvastatin  calcium ]    Review of Systems  Constitutional:  Negative for chills and fever.  Respiratory:  Negative for shortness of  breath.   Cardiovascular:  Positive for palpitations (Now resolved). Negative for chest pain.  Gastrointestinal:  Negative for abdominal pain.  Neurological:  Negative for light-headedness.  All other systems reviewed and are negative.   Updated Vital Signs BP 138/70 (BP Location: Right Arm)   Pulse (!) 45   Temp 98.1 F (36.7 C) (Oral)   Resp 15   Ht 5' 8 (1.727 m)   Wt 92.5 kg   SpO2 100%   BMI 31.02 kg/m   Physical Exam Vitals and nursing note reviewed.  Constitutional:      General: She is not in acute distress.    Appearance: Normal appearance. She is not ill-appearing.  HENT:     Head: Normocephalic and atraumatic.     Nose: Nose normal.   Eyes:     Conjunctiva/sclera: Conjunctivae normal.    Cardiovascular:     Rate and Rhythm: Normal rate and regular rhythm.  Pulmonary:     Effort: Pulmonary effort is normal. No respiratory distress.   Musculoskeletal:        General: No deformity. Normal range of motion.   Skin:    Findings: No rash.   Neurological:     Mental Status: She is alert.     (all labs ordered are listed, but only abnormal results are displayed) Labs Reviewed  CBC WITH DIFFERENTIAL/PLATELET - Abnormal; Notable for the following components:      Result Value   Hemoglobin 11.6 (*)    HCT 33.9 (*)  MCV 79.6 (*)    All other components within normal limits  BASIC METABOLIC PANEL WITH GFR  MAGNESIUM   TROPONIN I (HIGH SENSITIVITY)  TROPONIN I (HIGH SENSITIVITY)    EKG: EKG Interpretation Date/Time:  Tuesday October 24 2023 12:34:58 EDT Ventricular Rate:  53 PR Interval:  136 QRS Duration:  82 QT Interval:  396 QTC Calculation: 371 R Axis:   37  Text Interpretation: Sinus bradycardia Otherwise normal ECG When compared with ECG of 21-Sep-2023 11:10, No significant change since last tracing Confirmed by Towana Sharper [phone]) on 10/24/2023 12:43:24 PM  Radiology: DG Chest 2 View Result Date: 10/24/2023 CLINICAL DATA:  Chest pain  since yesterday, short of breath EXAM: CHEST - 2 VIEW COMPARISON:  09/18/2023 FINDINGS: Frontal and lateral views of the chest demonstrate an unremarkable cardiac silhouette. No acute airspace disease, effusion, or pneumothorax. No acute bony abnormalities. IMPRESSION: 1. No acute intrathoracic process. Electronically Signed   By: Sharper Daring M.D.   On: 10/24/2023 14:08     Procedures   Medications Ordered in the ED - No data to display  Clinical Course as of 10/24/23 1810  Tue Oct 24, 2023  1247 EKG 12-Lead [AA]    Clinical Course User Index [AA] Hildegard Loge, PA-C                                 Medical Decision Making Amount and/or Complexity of Data Reviewed Labs: ordered. Radiology: ordered. ECG/medicine tests:  Decision-making details documented in ED Course.   Medical Decision Making / ED Course   This patient presents to the ED for concern of chest pain, shortness of breath, palpitations, this involves an extensive number of treatment options, and is a complaint that carries with it a high risk of complications and morbidity.  The differential diagnosis includes A-fib with RVR, A-fib, SVT, PVCs, dehydration, orthostasis, ACS  MDM: 58 year old female presents today for above-mentioned symptoms.  Symptoms are all currently resolved. Patient was observed in the emergency department for 6 hours and remained asymptomatic. She was seen in triage.  We discussed her reassuring workup.  Feel she is appropriate for discharge.  We also discussed waiting for additional workup as well as cardiac monitoring on telemetry.  Patient defers and states she would like to go home as she has remained symptom free. Discussed close follow-up with cardiology as well as her PCP. Discharged in stable condition.  Return precaution discussed. 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  Since she has been out of this environment her symptoms have completely  resolved.    Lab Tests: -I ordered, reviewed, and interpreted labs.   The pertinent results include:   Labs Reviewed  CBC WITH DIFFERENTIAL/PLATELET - Abnormal; Notable for the following components:      Result Value   Hemoglobin 11.6 (*)    HCT 33.9 (*)    MCV 79.6 (*)    All other components within normal limits  BASIC METABOLIC PANEL WITH GFR  MAGNESIUM   TROPONIN I (HIGH SENSITIVITY)  TROPONIN I (HIGH SENSITIVITY)      EKG  EKG Interpretation Date/Time:  Tuesday October 24 2023 12:34:58 EDT Ventricular Rate:  53 PR Interval:  136 QRS Duration:  82 QT Interval:  396 QTC Calculation: 371 R Axis:   37  Text Interpretation: Sinus bradycardia Otherwise normal ECG When compared with ECG of 21-Sep-2023 11:10, No significant change since last  tracing Confirmed by Towana Sharper [id number]) on 10/24/2023 12:43:24 PM         Imaging Studies ordered: I ordered imaging studies including chest x-ray I independently visualized and interpreted imaging. I agree with the radiologist interpretation   Medicines ordered and prescription drug management: No orders of the defined types were placed in this encounter.   -I have reviewed the patients home medicines and have made adjustments as needed    Reevaluation: After the interventions noted above, I reevaluated the patient and found that they have :resolved  Co morbidities that complicate the patient evaluation  Past Medical History:  Diagnosis Date   A-fib (HCC)    Allergy     Arthritis    Lt ankle   Chronic kidney disease    right renal cyst    GERD (gastroesophageal reflux disease)    hx of no problems at present    Headache(784.0)    occasional headache    Hypertension    Influenza 04/20/2012   PONV (postoperative nausea and vomiting)    N/V and trouble urinating after surgery       Dispostion: Discharged in stable condition.  Return precaution discussed.  Patient voices understanding and is in agreement with  plan.    Final diagnoses:  Chest pain, unspecified type  Heat exhaustion, initial encounter    ED Discharge Orders          Ordered    Ambulatory referral to Cardiology       Comments: If you have not heard from the Cardiology office within the next 72 hours please call [phone].   10/24/23 1810               Hildegard Loge, PA-C 10/26/23 2319    Lowther, Amy, DO 10/29/23 2001

## 2023-10-24 NOTE — ED Triage Notes (Signed)
 Patient was work and there is no AC and started having palpitations and chest pain.  Reports her HR was 110.  Patient has a hx of afib and wanted to get seen to make sure she was not in afib because she is having an ablation in August.  Reports no chest pain or sob at this time.

## 2023-10-27 ENCOUNTER — Ambulatory Visit: Attending: Cardiology | Admitting: Pharmacist

## 2023-10-27 ENCOUNTER — Ambulatory Visit (INDEPENDENT_AMBULATORY_CARE_PROVIDER_SITE_OTHER): Admitting: Family Medicine

## 2023-10-27 ENCOUNTER — Encounter: Payer: Self-pay | Admitting: Family Medicine

## 2023-10-27 ENCOUNTER — Ambulatory Visit (INDEPENDENT_AMBULATORY_CARE_PROVIDER_SITE_OTHER): Admitting: Pharmacist

## 2023-10-27 ENCOUNTER — Encounter: Payer: Self-pay | Admitting: Pharmacist

## 2023-10-27 VITALS — BP 154/80 | HR 48 | Ht 68.0 in | Wt 202.2 lb

## 2023-10-27 VITALS — BP 142/80 | HR 50

## 2023-10-27 VITALS — BP 185/97

## 2023-10-27 DIAGNOSIS — I482 Chronic atrial fibrillation, unspecified: Secondary | ICD-10-CM

## 2023-10-27 DIAGNOSIS — I10 Essential (primary) hypertension: Secondary | ICD-10-CM

## 2023-10-27 DIAGNOSIS — Z23 Encounter for immunization: Secondary | ICD-10-CM | POA: Diagnosis not present

## 2023-10-27 MED ORDER — HYDROCHLOROTHIAZIDE 25 MG PO TABS: 25.0000 mg | ORAL_TABLET | Freq: Every day | ORAL | 3 refills | Status: DC

## 2023-10-27 NOTE — Assessment & Plan Note (Signed)
 History of hypertension since 2008, currently taking hydrochlorothiazide  and spironolactone  with goal presssure of <130/80 mm Hg. Multiple medication intolerance in patient with history of low potassium.   -Placed blood pressure cuff, provided education, patient instructed to wear cuff for 24 hours and return tomorrow to review results.

## 2023-10-27 NOTE — Progress Notes (Signed)
    SUBJECTIVE:   CHIEF COMPLAINT / HPI:   HTN: Here for follow up. Compliant with her home hydrochlorothiazide  and Aldactone . Home BP readings are within normal range, with her most recent of 135/70.  Chest pain/HFU: She was recently admitted to the hospital for chest pain and palpitations. This was attributed to excessive heat exposure at work. Symptoms improved, but persists at work. She feels well today.  PERTINENT  PMH / PSH: PMHx reviewed  OBJECTIVE:   BP (!) 154/80   Pulse (!) 48   Ht 5' 8 (1.727 m)   Wt 202 lb 3.2 oz (91.7 kg)   SpO2 99%   BMI 30.74 kg/m   Physical Exam Vitals and nursing note reviewed.   Cardiovascular:     Rate and Rhythm: Normal rate and regular rhythm.     Heart sounds: Normal heart sounds. No murmur heard. Pulmonary:     Effort: Pulmonary effort is normal. No respiratory distress.     Breath sounds: Normal breath sounds. No wheezing.      ASSESSMENT/PLAN:   Assessment & Plan Encounter for immunization PCV20 and Hep B given today Essential hypertension, benign BP uncontrolled Claimed controlled BP at home ?? White coat HTN Continue Aldactone  25 mg every day and hydrochlorothiazide  12.5 mg every day for now Scheduled ambulatory BP management with Dr. Koval. F/U with me after  Chronic atrial fibrillation (HCC) CP and palpitation with recent hospitalization Symptoms improved Has Cardiology f/u scheduled Holding Coreg due to bradycardia Continue Eliquis     Otto Fairly, MD [facility]

## 2023-10-27 NOTE — Progress Notes (Signed)
   S:     Chief Complaint  Patient presents with   Medication Management    Amb BP Monitor - Day #1   58 y.o. female who presents for hypertension evaluation, education, and management. Patient arrives in good spirits and presents without any assistance.  Patient was referred and last seen by Primary Care Provider, Dr. Anders this AM.   PMH is significant for hyperlipidemia, Afib, hypokalemia, Diabetes.   Diagnosed with Hypertension in 2008.  Reports multiple family members have HTN.    Medication compliance is reported to be good.  Discussed procedure for wearing the monitor and gave patient written instructions. Monitor was placed on non-dominant arm with instructions to return in the morning.   Current BP Medications include:  hydrochlorothiazide  12.5mg  daily AND spironolactone  25mg  daily  Antihypertensives tried in the past include: amlodipine  and losartan  intolerant, metoprolol .    O:  Review of Systems  All other systems reviewed and are negative.   Physical Exam Vitals reviewed.  Constitutional:      Appearance: Normal appearance.  Pulmonary:     Effort: Pulmonary effort is normal.   Neurological:     Mental Status: She is alert.   Psychiatric:        Mood and Affect: Mood normal.        Thought Content: Thought content normal.    Last 3 Office BP readings: BP Readings from Last 3 Encounters:  10/27/23 (!) 185/97  10/27/23 (!) 154/80  10/24/23 138/70    Basic Metabolic Panel    Component Value Date/Time   NA 140 10/24/2023 1305   NA 141 10/06/2023 1057   NA 136 03/18/2009 1439   K 3.6 10/24/2023 1305   K 3.0 (L) 03/18/2009 1439   CL 106 10/24/2023 1305   CL 99 03/18/2009 1439   CO2 23 10/24/2023 1305   CO2 29 03/18/2009 1439   GLUCOSE 92 10/24/2023 1305   GLUCOSE 97 03/18/2009 1439   BUN 13 10/24/2023 1305   BUN 11 10/06/2023 1057   BUN 13 03/18/2009 1439   CREATININE 0.86 10/24/2023 1305   CREATININE 0.77 03/09/2016 0910   CALCIUM  9.5  10/24/2023 1305   CALCIUM  8.9 03/18/2009 1439   GFRNONAA >60 10/24/2023 1305   GFRAA 89 06/05/2020 1626    Renal function: Estimated Creatinine Clearance: 83.4 mL/min (by C-G formula based on SCr of 0.86 mg/dL).   ABPM Study Data: Arm Placement left arm   For Office Goal BP of <130/80 mmHg:  ABPM thresholds: Overall BP <125/75 mmHg, daytime BP <130/80 mmHg, sleeptime BP <110/65 mmHg   A/P: History of hypertension since 2008, currently taking hydrochlorothiazide  and spironolactone  with goal presssure of <130/80 mm Hg. Multiple medication intolerance in patient with history of low potassium.   -Placed blood pressure cuff, provided education, patient instructed to wear cuff for 24 hours and return tomorrow to review results.   Written patient instructions provided including activity/symptom/event log. Patient verbalized understanding of plan. Total time in face to face counseling 13 minutes.    Follow-up: Monday 4:00 PM

## 2023-10-27 NOTE — Assessment & Plan Note (Addendum)
 BP uncontrolled Claimed controlled BP at home ?? White coat HTN Continue Aldactone  25 mg every day and hydrochlorothiazide  12.5 mg every day for now Scheduled ambulatory BP management with Dr. Koval. F/U with me after

## 2023-10-27 NOTE — Assessment & Plan Note (Addendum)
 CP and palpitation with recent hospitalization Symptoms improved Has Cardiology f/u scheduled Holding Coreg due to bradycardia Continue Eliquis

## 2023-10-27 NOTE — Patient Instructions (Addendum)
 Changes made by your pharmacist Robbi Blanch, PharmD at today's visit:    Instructions/Changes  (what do you need to do) Your Notes  (what you did and when you did it)  Increase dose of hydrochlorothiazide  from 12.5 mg to 25 mg daily    Continue taking spironolactone  25 mg daily    Increase intake of potassium rich food and get BMP lab checked on July 11 or July 3rd     Bring all of your meds, your BP cuff and your record of home blood pressures to your next appointment.    HOW TO TAKE YOUR BLOOD PRESSURE AT HOME  Rest 5 minutes before taking your blood pressure.  Don't smoke or drink caffeinated beverages for at least 30 minutes before. Take your blood pressure before (not after) you eat. Sit comfortably with your back supported and both feet on the floor (don't cross your legs). Elevate your arm to heart level on a table or a desk. Use the proper sized cuff. It should fit smoothly and snugly around your bare upper arm. There should be enough room to slip a fingertip under the cuff. The bottom edge of the cuff should be 1 inch above the crease of the elbow. Ideally, take 3 measurements at one sitting and record the average.  Important lifestyle changes to control high blood pressure  Intervention  Effect on the BP  Lose extra pounds and watch your waistline Weight loss is one of the most effective lifestyle changes for controlling blood pressure. If you're overweight or obese, losing even a small amount of weight can help reduce blood pressure. Blood pressure might go down by about 1 millimeter of mercury (mm Hg) with each kilogram (about 2.2 pounds) of weight lost.  Exercise regularly As a general goal, aim for at least 30 minutes of moderate physical activity every day. Regular physical activity can lower high blood pressure by about 5 to 8 mm Hg.  Eat a healthy diet Eating a diet rich in whole grains, fruits, vegetables, and low-fat dairy products and low in saturated fat and  cholesterol. A healthy diet can lower high blood pressure by up to 11 mm Hg.  Reduce salt (sodium) in your diet Even a small reduction of sodium in the diet can improve heart health and reduce high blood pressure by about 5 to 6 mm Hg.  Limit alcohol One drink equals 12 ounces of beer, 5 ounces of wine, or 1.5 ounces of 80-proof liquor.  Limiting alcohol to less than one drink a day for women or two drinks a day for men can help lower blood pressure by about 4 mm Hg.   If you have any questions or concerns please use My Chart to send questions or call the office at [phone]

## 2023-10-27 NOTE — Progress Notes (Signed)
 Patient ID: [name]                 DOB: 26-Apr-1966                      MRN: [id number]      HPI: [name] is a 58 y.o. female referred by Dr. Cindie  to HTN clinic. PMH is significant for HTN, DM2, atrial fibrillation. Admitted 4/3 - 08/04/2023 with new onset A-fib RVR.  Metoprolol  decreased to 25 mg daily due to bradycardia.  Xarelto  20 mg daily initiated.  Echo LVEF 60 to 65%, mild LVH.  Indapamide  spironolactone  held due to normotension. At visit with Veronica Walker, NP metoprolol  dose was further reduced to 12.5 mg due to bradycardia and spironolactone  25 mg  was continued    Patient saw Dr.Lambert on 09/21/23 for an evaluation of atrial fibrillation.  Scheduled for Atrial Fibrillation Ablation on Wednesday, August 13 with Dr. Cindie. Pt's BP was elevated so low dose hydrochlorothiazide  was initiated.   At last visit with me correct BP measurement steps were discussed and we discussed to watched for hidden sodium in food. BP in the office was near normal.  The patient presented today for a follow-up visit regarding hypertension management. She reported that earlier this morning she visited her PCP and was fitted with a continuous blood pressure monitoring device. Over the past two weeks, she has noticed significant fluctuations in her blood pressure, which she attributes to recent heat exposure. Home BP readings ranged from a low of 126/70 to a high of 160/101. She noted that the cooling system at her workplace had broken, and her home air conditioning was unavailable due to scheduled maintenance and cleaning by the landlord, which added to her stress. Despite these environmental stressors, she reports taking her current antihypertensive medications as prescribed and tolerates them well without any side effects.   Current HTN meds: hydrochlorothiazide  12.5 mg daily and spironolactone  25 mg daily  Previously tried: indapamide - low potassium, Toprol  XL >12.5 mg -  bradycardia, losartan  - angioedema, atenolol- extreme tiredness, amlodipine  2.5 mg - ankle swelling  BP goal: <130/80   CrCl 80 mL/min     Family History:  Relation Problem Comments  Mother (Deceased) Diabetes   Heart disease     Father (Deceased) Hypertension     Sister (Deceased) Diabetes   Kidney disease     Sister - Sales executive Metallurgist) Diabetes     Sister - Careers information officer (Alive) Hypertension     Sister - Primary school teacher (Alive) Hypertension     Sister - Web designer (Alive) Hypertension     Brother - greg Metallurgist) Arthritis   Hypertension   Kidney disease     Brother - timothy Metallurgist) Hypertension   Mental illness     Maternal Grandmother (Deceased)      Social History:  Alcohol: none Smoking: never  Recreational drug use: none   Diet: fast food -eats out  Sandwich in the morning and supper  [place] chicken salad,  Cookies and cakes, chips -sometimes  Drink- water, slush- once a month or cheer wine - once a month   Exercise:  20 min per day 4 days per week   Home BP readings: recalls only one reading    Wt Readings from Last 3 Encounters:  10/27/23 202 lb 3.2 oz (91.7 kg)  10/24/23 204 lb (92.5 kg)  10/06/23 204 lb 12.8 oz (92.9 kg)   BP Readings from Last 3 Encounters:  10/27/23 ROLLEN)  142/80  10/27/23 (!) 185/97  10/27/23 (!) 154/80   Pulse Readings from Last 3 Encounters:  10/27/23 (!) 50  10/27/23 (!) 48  10/24/23 (!) 45    Renal function: Estimated Creatinine Clearance: 83.4 mL/min (by C-G formula based on SCr of 0.86 mg/dL).  Past Medical History:  Diagnosis Date   A-fib [facility])    Allergy     Arthritis    Lt ankle   Chronic kidney disease    right renal cyst    GERD (gastroesophageal reflux disease)    hx of no problems at present    Headache(784.0)    occasional headache    Hypertension    Influenza 04/20/2012   PONV (postoperative nausea and vomiting)    N/V and trouble urinating after surgery     Current Outpatient Medications on File Prior to Visit   Medication Sig Dispense Refill   acetaminophen  (TYLENOL ) 500 MG tablet Take 500 mg by mouth every 6 (six) hours as needed for moderate pain (pain score 4-6).     cholecalciferol  (VITAMIN D3) 25 MCG (1000 UNIT) tablet Take 1,000 Units by mouth daily. (Patient not taking: Reported on 10/27/2023)     meclizine  (ANTIVERT ) 25 MG tablet Take 1 tablet (25 mg total) by mouth 3 (three) times daily as needed for dizziness. 30 tablet 0   rivaroxaban  (XARELTO ) 20 MG TABS tablet Take 1 tablet (20 mg total) by mouth daily with supper. 90 tablet 3   spironolactone  (ALDACTONE ) 25 MG tablet TAKE 1 TABLET BY MOUTH EVERY DAY 90 tablet 1   tirzepatide  (MOUNJARO ) 2.5 MG/0.5ML Pen Inject 2.5 mg into the skin once a week. (Patient not taking: Reported on 10/27/2023) 2 mL 0   No current facility-administered medications on file prior to visit.    Allergies  Allergen Reactions   Tetracycline Hcl Shortness Of Breath, Swelling and Other (See Comments)    Large purple bruise   Angiotensin Receptor Blockers Swelling    Had episode of angioedema on Losartan     Nickel Dermatitis and Itching   Sulfamethoxazole-Trimethoprim Itching, Swelling and Other (See Comments)    Purple bruise   Amlodipine  Other (See Comments)    Leg edema and bradycardia per documentation   Crestor  [Rosuvastatin  Calcium ]     Muscle pains    Blood pressure (!) 142/80, pulse (!) 50, SpO2 98%.   Assessment/Plan:  1. Hypertension -  Essential hypertension, benign Assessment: In office 1st BP 142/80 heart rate 50 upon rechecking it was 146/84   Hoem BP varies a lot due - stressed from heat  Tolerates current BP meds well without any side effects Denies SOB, palpitation, chest pain, headaches,or swelling Reiterated the importance of regular exercise and low salt diet  Home BP machine validated found to be accurate   Plan:   Will up the hydrochlorothiazide  dose to 25 mg and continue spironolactone  25 mg daily  In the past  hydrochlorothiazide  lad to hypokalemia but now that she is on spironolactone  it may keep it WNL also advised pt to eat potassium rich food to avoid hypokalemia  Patient to keep record of BP readings with heart rate and report to us  at the next visit Patient to see PharmD in 4 weeks for follow up  Follow up lab(s): BMP on July 3       Thank you  Robbi Blanch, [place].D [place] [facility]speth BIRCH. [facility] [address], [place], [place] [address] Phone: [phone]; Fax: [phone]

## 2023-10-27 NOTE — Patient Instructions (Signed)
 Blood Pressure Activity Diary Time Lying down/ Sleeping Walking/ Exercise Stressed/ Angry Headache/ Pain Dizzy  9 AM       10 AM       11 AM       12 PM       1 PM       2 PM       Time Lying down/ Sleeping Walking/ Exercise Stressed/ Angry Headache/ Pain Dizzy  3 PM       4 PM        5 PM       6 PM       7 PM       8 PM       Time Lying down/ Sleeping Walking/ Exercise Stressed/ Angry Headache/ Pain Dizzy  9 PM       10 PM       11 PM       12 AM       1 AM       2 AM       3 AM       Time Lying down/ Sleeping Walking/ Exercise Stressed/ Angry Headache/ Pain Dizzy  4 AM       5 AM       6 AM       7 AM       8 AM       9 AM       10 AM        Time you woke up: _________                  Time you went to sleep:__________  Come back Monday at 4:00 PM to have the monitor removed  Call the [facility] if you have any questions before then ([phone])  Wearing the Blood Pressure Monitor The cuff will inflate every 20 minutes during the day and every 30 minutes while you sleep. Fill out the blood pressure-activity diary during the day, especially during activities that may affect your reading -- such as exercise, stress, walking, taking your blood pressure medications  Important things to know: Avoid taking the monitor off for the next 24 hours, unless it causes you discomfort or pain. Do NOT get the monitor wet and do NOT try to clean the monitor with any cleaning products. Do NOT put the monitor on anyone else's arm. When the cuff inflates, avoid excess movement. Let the cuffed arm hang loosely, slightly away from the body. Avoid flexing the muscles or moving the hand/fingers. Remember to fill out the blood pressure activity diary. If you experience severe pain or unusual pain (not associated with getting your blood pressure checked), remove the monitor.  Troubleshooting:  Code  Troubleshooting   1  Check cuff position, tighten cuff   2, 3  Remain  still during reading   4, 87  Check air hose connections and make sure cuff is tight   85, 89  Check hose connections and make tubing is not crimped   86  Push START/STOP to restart reading   88, 91  Retry by pushing START/STOP   90  Replace batteries. If problem persists, remove monitor and bring back to   clinic at follow up   97, 98, 99  Service required - Remove monitor and bring back to clinic at follow up

## 2023-10-27 NOTE — Patient Instructions (Signed)
 It was nice seeing you. Your BP is elevated again. Since your numbers are good at home, we will do ambulatory BP monitoring with Dr. Koval. We will adjust your medication after.

## 2023-10-27 NOTE — Assessment & Plan Note (Addendum)
 Assessment: In office 1st BP 142/80 heart rate 50 upon rechecking it was 146/84   Hoem BP varies a lot due - stressed from heat  Tolerates current BP meds well without any side effects Denies SOB, palpitation, chest pain, headaches,or swelling Reiterated the importance of regular exercise and low salt diet  Home BP machine validated found to be accurate   Plan:   Will up the hydrochlorothiazide  dose to 25 mg and continue spironolactone  25 mg daily  In the past hydrochlorothiazide  lad to hypokalemia but now that she is on spironolactone  it may keep it WNL also advised pt to eat potassium rich food to avoid hypokalemia  Patient to keep record of BP readings with heart rate and report to us  at the next visit Patient to see PharmD in 4 weeks for follow up  Follow up lab(s): BMP on July 3

## 2023-10-30 ENCOUNTER — Encounter: Payer: Self-pay | Admitting: Pharmacist

## 2023-10-30 ENCOUNTER — Ambulatory Visit (INDEPENDENT_AMBULATORY_CARE_PROVIDER_SITE_OTHER): Admitting: Pharmacist

## 2023-10-30 VITALS — BP 145/70 | HR 51

## 2023-10-30 DIAGNOSIS — I10 Essential (primary) hypertension: Secondary | ICD-10-CM | POA: Diagnosis not present

## 2023-10-30 DIAGNOSIS — E119 Type 2 diabetes mellitus without complications: Secondary | ICD-10-CM | POA: Diagnosis not present

## 2023-10-30 MED ORDER — TRULICITY 0.75 MG/0.5ML ~~LOC~~ SOAJ
0.7500 mg | SUBCUTANEOUS | 3 refills | Status: DC
Start: 2023-10-30 — End: 2024-01-02

## 2023-10-30 MED ORDER — SPIRONOLACTONE 50 MG PO TABS: 50.0000 mg | ORAL_TABLET | Freq: Every day | ORAL | 3 refills | Status: DC

## 2023-10-30 NOTE — Assessment & Plan Note (Signed)
 History of hypertension since 2008 currently taking hydrochlorothiazide  25mg  and spironolactone  25mg .  Found to have isolated systolic hypertension with 24-hour ambulatory blood pressure evaluation which demonstrates an average AWAKE blood pressure of 145/70 mmHg. Nocturnal dipping pattern is normal with asleep average of 125/58 mm Hg.  Goal adjusted to < 140 systolic and lower if possible.  Changes to medications - Continue hydrochlorothiazide  25mg  daily - Increase spironolactone  to 50mg  daily

## 2023-10-30 NOTE — Progress Notes (Signed)
 S:     Chief Complaint  Patient presents with   Medication Management    Amb BP Monitor Day #2   58 y.o. female who presents for hypertension evaluation, education, and management.  Patient arrives in good spirits and presents without any assistance.   Patient returns to clinic with 24 hour blood pressure monitor and reports they were able to wear the ambulatory blood pressure cuff until 8AM when she removed it (~21 hours of readings).    O:  Review of Systems  All other systems reviewed and are negative.   Physical Exam Vitals reviewed.  Constitutional:      Appearance: Normal appearance.  Pulmonary:     Effort: Pulmonary effort is normal.   Neurological:     Mental Status: She is alert.   Psychiatric:        Mood and Affect: Mood normal.        Thought Content: Thought content normal.        Judgment: Judgment normal.     Last 3 Office BP readings: BP Readings from Last 3 Encounters:  10/30/23 (!) 145/70  10/27/23 (!) 142/80  10/27/23 (!) 185/97    Clinical Atherosclerotic Cardiovascular Disease (ASCVD): No    Basic Metabolic Panel    Component Value Date/Time   NA 140 10/24/2023 1305   NA 141 10/06/2023 1057   NA 136 03/18/2009 1439   K 3.6 10/24/2023 1305   K 3.0 (L) 03/18/2009 1439   CL 106 10/24/2023 1305   CL 99 03/18/2009 1439   CO2 23 10/24/2023 1305   CO2 29 03/18/2009 1439   GLUCOSE 92 10/24/2023 1305   GLUCOSE 97 03/18/2009 1439   BUN 13 10/24/2023 1305   BUN 11 10/06/2023 1057   BUN 13 03/18/2009 1439   CREATININE 0.86 10/24/2023 1305   CREATININE 0.77 03/09/2016 0910   CALCIUM  9.5 10/24/2023 1305   CALCIUM  8.9 03/18/2009 1439   GFRNONAA >60 10/24/2023 1305   GFRAA 89 06/05/2020 1626    Renal function: Estimated Creatinine Clearance: 83.4 mL/min (by C-G formula based on SCr of 0.86 mg/dL).   ABPM Study Data:  Overall Mean 24hr BP:   139/67 mmHg  HR: 49  Daytime Mean BP:  145/70 mmHg  HR: 51  Nighttime Mean BP:  125/58  mmHg  HR: 44  Dipping Pattern: Yes.    Sys:   14%   Dia: 17%  [normal dipping ~10-20%]   For Office Goal BP of <140/90 mmHg:  ABPM thresholds: Overall BP <130/80 mmHg, daytime BP <135/85 mmHg, sleeptime BP <120/70 mmHg   A/P: History of hypertension since 2008 currently taking hydrochlorothiazide  25mg  and spironolactone  25mg .  Found to have isolated systolic hypertension with 24-hour ambulatory blood pressure evaluation which demonstrates an average AWAKE blood pressure of 145/70 mmHg. Nocturnal dipping pattern is normal with asleep average of 125/58 mm Hg.  Goal adjusted to < 140 systolic and lower if possible.  Changes to medications - Continue hydrochlorothiazide  25mg  daily - Increase spironolactone  to 50mg  daily  Results reviewed and written information provided.    Diabetes type 2 - recently prescribed Mounjaro  (tirzepatide ) unfortunately, insurance requires trial of alternate agent.  Following discussion of needle phobia, we agreed that a trial of Trulicity once weekly was an appropriate next step.   - START Trulicity (dulaglutide) 0.75mg  once weekly.  In place of Mounjaro  Patient educated on purpose, proper use and potential GI adverse effects.  Following instruction patient verbalized understanding of treatment plan.  Patient  was able to demonstrate appropriate technique while using demonstration device.   Written patient instructions provided. Patient verbalized understanding of treatment plan.  Total time in face to face counseling 27 minutes.    Follow-up:  Pharmacist 2 weeks for BP And lab monitoring follow-up PCP clinic visit in TBD

## 2023-10-30 NOTE — Assessment & Plan Note (Signed)
 Diabetes type 2 - recently prescribed Mounjaro  (tirzepatide ) unfortunately, insurance requires trial of alternate agent.  Following discussion of needle phobia, we agreed that a trial of Trulicity once weekly was an appropriate next step.   - START Trulicity (dulaglutide) 0.75mg  once weekly.  In place of Mounjaro  Patient educated on purpose, proper use and potential GI adverse effects.  Following instruction patient verbalized understanding of treatment plan.  Patient was able to demonstrate appropriate technique while using demonstration device.

## 2023-10-30 NOTE — Progress Notes (Signed)
 Reviewed and agree with Dr Macky Lower plan.

## 2023-10-30 NOTE — Patient Instructions (Addendum)
 It was nice to see you today!  Thank you for completing the blood pressure monitoring evaluation.  Your goal blood pressure is < 130/80 mmHg - Your top number is higher than our goal  Medication Changes: Increase Spironolactone  to 50 mg daily (take two of your spironolactone  until gone)  START Trulicity (dulaglutide) 0.75mg  once weekly  Continue taking hydrochlorothiazide  25 mg daily  Continue all other medication the same.   Monitor blood pressure at home and keep a log (on a piece of paper) to bring with you to your next visit.

## 2023-11-01 NOTE — Progress Notes (Signed)
 Reviewed and agree with Dr Macky Lower plan.

## 2023-11-14 ENCOUNTER — Other Ambulatory Visit (HOSPITAL_COMMUNITY): Payer: Self-pay

## 2023-11-14 ENCOUNTER — Encounter: Payer: Self-pay | Admitting: Pharmacist

## 2023-11-14 ENCOUNTER — Ambulatory Visit (INDEPENDENT_AMBULATORY_CARE_PROVIDER_SITE_OTHER): Admitting: Pharmacist

## 2023-11-14 VITALS — BP 123/74 | HR 88 | Wt 205.6 lb

## 2023-11-14 DIAGNOSIS — I10 Essential (primary) hypertension: Secondary | ICD-10-CM

## 2023-11-14 DIAGNOSIS — E119 Type 2 diabetes mellitus without complications: Secondary | ICD-10-CM | POA: Diagnosis not present

## 2023-11-14 NOTE — Assessment & Plan Note (Signed)
 Hypertension diagnosed in 2008 currently much improved control with both home readings and in office reading today. At BP goal < 130/80 mmHg. Medication adherence appears to be good.  -Continued hydrochlorothiazide  25mg  daily - Continue spironolactone  50mg  daily Patient educated on purpose, proper use, and potential adverse effects of gynecomastia.  -F/u labs ordered - BMET today

## 2023-11-14 NOTE — Patient Instructions (Signed)
 It was nice to see you today!  Your goal blood pressure is  <130/80 mm Hg     Medication Changes: - START - Trulicity  (dulaglutide ) 0.75mg  once weekly  - Continue all other medication the same.  Keep up the good work with diet and exercise. Aim for a diet full of vegetables, fruit and lean meats (chicken, [place], fish). Try to limit salt intake by eating fresh or frozen vegetables (instead of canned), rinse canned vegetables prior to cooking and do not add any additional salt to meals.   Monitor blood pressure at home daily and keep a log (on your phone or piece of paper) to bring with you to your next visit. Write down date, time, blood pressure and pulse.   Please bring all medications to your clinic visits.  Please arrive 10-15 minutes prior to your scheduled visit time.

## 2023-11-14 NOTE — Progress Notes (Signed)
 S:     Chief Complaint  Patient presents with   Medication Management    Diabetes and Hypertension   58 y.o. female who presents for hypertension evaluation, education, and management.   Patient was referred and last seen by Primary Care Provider, Dr. Anders, on 10/27/2023.  Patient completed Amb Blood Pressure monitoring on 10/30/2023 and mean daytime pressure was found to be 145/70  PMH is significant for AFib, Diabetes   Today, patient arrives in good spirits and presents without assistance. Denies dizziness, headache, blurred vision, swelling and states she believes she has more energy.    Patient reports hypertension was diagnosed in 2008.   Medication adherence appears good . Patient reports taking blood pressure medications today.   Current antihypertensives include: hydrochlorothiazide  25mg , spironolactone  50mg    Antihypertensives tried in the past include: ARB, amlodipine  (intolerant to both)  Reported home blood pressure readings: < 130 systolic  ASCVD risk factors include: diabetes, hyperlipidemia  O:  Review of Systems  Neurological:  Negative for dizziness and headaches.  All other systems reviewed and are negative. Decreased muscle cramps recently  Physical Exam Vitals reviewed.  Constitutional:      Appearance: Normal appearance.  Pulmonary:     Effort: Pulmonary effort is normal.  Neurological:     Mental Status: She is alert.  Psychiatric:        Mood and Affect: Mood normal.        Behavior: Behavior normal.        Thought Content: Thought content normal.        Judgment: Judgment normal.     Last 3 Office BP readings: BP Readings from Last 3 Encounters:  11/14/23 123/74  10/30/23 (!) 145/70  10/27/23 (!) 142/80    BMET    Component Value Date/Time   NA 140 10/24/2023 1305   NA 141 10/06/2023 1057   NA 136 03/18/2009 1439   K 3.6 10/24/2023 1305   K 3.0 (L) 03/18/2009 1439   CL 106 10/24/2023 1305   CL 99 03/18/2009 1439   CO2 23  10/24/2023 1305   CO2 29 03/18/2009 1439   GLUCOSE 92 10/24/2023 1305   GLUCOSE 97 03/18/2009 1439   BUN 13 10/24/2023 1305   BUN 11 10/06/2023 1057   BUN 13 03/18/2009 1439   CREATININE 0.86 10/24/2023 1305   CREATININE 0.77 03/09/2016 0910   CALCIUM  9.5 10/24/2023 1305   CALCIUM  8.9 03/18/2009 1439   GFRNONAA >60 10/24/2023 1305   GFRAA 89 06/05/2020 1626    Renal function: CrCl cannot be calculated (Patient's most recent lab result is older than the maximum 21 days allowed.).  Clinical ASCVD: No  The ASCVD Risk score (Arnett DK, et al., 2019) failed to calculate for the following reasons:   The valid total cholesterol range is 130 to 320 mg/dL   A/P: Hypertension diagnosed in 2008 currently much improved control with both home readings and in office reading today. At BP goal < 130/80 mmHg. Medication adherence appears to be good.  -Continued hydrochlorothiazide  25mg  daily - Continue spironolactone  50mg  daily Patient educated on purpose, proper use, and potential adverse effects of gynecomastia.  -F/u labs ordered - BMET today -Encouraged patient to check BP at home and bring log of readings to next visit.   Diabetes - unfortunately, PA was not communicated/completed between pharmacy and office.  Today, attempted to resolve PA issue, with help of Lavern Ku, CPhT.  - Anticipate  availability of Trulicity  (dulaglutide ) in the next 2-3  days - Patient encouraged to start once weekly injection when available.   Results reviewed and written information provided.    Written patient instructions provided. Patient verbalized understanding of treatment plan.  Total time in face to face counseling 27 minutes.    Follow-up:  Pharmacist PRN. PCP clinic visit - planned for a time following sleep study and Ablation procecure.

## 2023-11-14 NOTE — Assessment & Plan Note (Signed)
 Diabetes - unfortunately, PA was not communicated/completed between pharmacy and office.  Today, attempted to resolve PA issue, with help of Lavern Ku, CPhT.  - Anticipate  availability of Trulicity  (dulaglutide ) in the next 2-3 days - Patient encouraged to start once weekly injection when available.

## 2023-11-15 ENCOUNTER — Ambulatory Visit: Payer: Self-pay | Admitting: Pharmacist

## 2023-11-15 ENCOUNTER — Telehealth: Payer: Self-pay

## 2023-11-15 LAB — BASIC METABOLIC PANEL WITH GFR
BUN/Creatinine Ratio: 13 (ref 9–23)
BUN: 18 mg/dL (ref 6–24)
CO2: 21 mmol/L (ref 20–29)
Calcium: 9.7 mg/dL (ref 8.7–10.2)
Chloride: 102 mmol/L (ref 96–106)
Creatinine, Ser: 1.44 mg/dL — ABNORMAL HIGH (ref 0.57–1.00)
Glucose: 93 mg/dL (ref 70–99)
Potassium: 4 mmol/L (ref 3.5–5.2)
Sodium: 141 mmol/L (ref 134–144)
eGFR: 42 mL/min/1.73 — ABNORMAL LOW (ref >59)

## 2023-11-15 NOTE — Telephone Encounter (Signed)
 Pharmacy Patient Advocate Encounter   Received notification from Physician's Office that prior authorization for TRULICITY  0.75MG  is required/requested.   Insurance verification completed.   The patient is insured through [facility] .   PA required; PA submitted to above mentioned insurance via CoverMyMeds Key/confirmation #/EOC [id number]. Status is pending

## 2023-11-15 NOTE — Telephone Encounter (Signed)
 Attempted to contact patient for follow-up of labs   Left HIPAA compliant voice mail requesting call back to direct phone: [phone]  Total time with patient call and documentation of interaction: 4 minutes.  Follow-up phone call planned: 11/16/2023

## 2023-11-15 NOTE — Telephone Encounter (Signed)
 Pharmacy Patient Advocate Encounter  Received notification from [facility] that Prior Authorization for TRULICITY  0.75MG  has been APPROVED from 11/14/23 to 11/12/24   PA #/Case ID/Reference #: [id number]

## 2023-11-16 ENCOUNTER — Ambulatory Visit (INDEPENDENT_AMBULATORY_CARE_PROVIDER_SITE_OTHER): Admitting: Pharmacist

## 2023-11-16 ENCOUNTER — Encounter: Payer: Self-pay | Admitting: Pulmonary Disease

## 2023-11-16 ENCOUNTER — Encounter: Payer: Self-pay | Admitting: Pharmacist

## 2023-11-16 ENCOUNTER — Ambulatory Visit (INDEPENDENT_AMBULATORY_CARE_PROVIDER_SITE_OTHER): Admitting: Pulmonary Disease

## 2023-11-16 VITALS — BP 126/82 | HR 57 | Ht 68.0 in | Wt 206.2 lb

## 2023-11-16 DIAGNOSIS — E119 Type 2 diabetes mellitus without complications: Secondary | ICD-10-CM | POA: Diagnosis not present

## 2023-11-16 DIAGNOSIS — G4719 Other hypersomnia: Secondary | ICD-10-CM | POA: Diagnosis not present

## 2023-11-16 DIAGNOSIS — I10 Essential (primary) hypertension: Secondary | ICD-10-CM

## 2023-11-16 DIAGNOSIS — G478 Other sleep disorders: Secondary | ICD-10-CM

## 2023-11-16 NOTE — Patient Instructions (Addendum)
 BMET today - we will be in touch with results.

## 2023-11-16 NOTE — Progress Notes (Signed)
 Dr.Koval is seeing patient today at 2:30.

## 2023-11-16 NOTE — Progress Notes (Signed)
   S:     Chief Complaint  Patient presents with   Medication Management    Lab Follow-up   58 y.o. female who presents for hypertension evaluation, education, and management.   Patient was seen two days ago and had elevation of creatinine on BMET. Concern for dehydration vs. Side effect of medication.   PMH is significant for HTN.   Today, patient arrives in good spirits and presents without assistance. Denies dizziness, headache, blurred vision, swelling.    O:  Review of Systems  All other systems reviewed and are negative.   Physical Exam Constitutional:      Appearance: Normal appearance.  Pulmonary:     Effort: Pulmonary effort is normal.  Neurological:     Mental Status: She is alert.     Last 3 Office BP readings: BP Readings from Last 3 Encounters:  11/14/23 123/74  10/30/23 (!) 145/70  10/27/23 (!) 142/80    BMET    Component Value Date/Time   NA 141 11/14/2023 1643   NA 136 03/18/2009 1439   K 4.0 11/14/2023 1643   K 3.0 (L) 03/18/2009 1439   CL 102 11/14/2023 1643   CL 99 03/18/2009 1439   CO2 21 11/14/2023 1643   CO2 29 03/18/2009 1439   GLUCOSE 93 11/14/2023 1643   GLUCOSE 92 10/24/2023 1305   GLUCOSE 97 03/18/2009 1439   BUN 18 11/14/2023 1643   BUN 13 03/18/2009 1439   CREATININE 1.44 (H) 11/14/2023 1643   CREATININE 0.77 03/09/2016 0910   CALCIUM  9.7 11/14/2023 1643   CALCIUM  8.9 03/18/2009 1439   GFRNONAA >60 10/24/2023 1305   GFRAA 89 06/05/2020 1626    Renal function: Estimated Creatinine Clearance: 50.3 mL/min (A) (by C-G formula based on SCr of 1.44 mg/dL (H)).   A/P: Hypertension recently evaluated and controlled on combination medication regimen.  BP at goal < 130/80  mmHg. Medication adherence appears good. Concern for SCr elevation from 0.7 to 1.44 Patient had arrived after a long day at work prior to last visit and believes she may have been dehydrated that day.  - no change today.  - BMET today.  -Patient  understanding of lab abnormality and reason for repeat assessment.   Results reviewed and written information provided.    Written patient instructions provided. Patient verbalized understanding of treatment plan.  Total time in face to face counseling 11 minutes.    Follow-up:  Pharmacist and PCP follow-up as previous.

## 2023-11-16 NOTE — Progress Notes (Signed)
 Reviewed and agree with Dr Macky Lower plan.

## 2023-11-16 NOTE — Progress Notes (Signed)
 You should make a Tylenol               [name]    [id number]    1965/08/16  Primary Care Physician:Eniola, Otto DASEN, MD  Referring Physician: Anders Otto DASEN, MD [address] [place],  [place] [address]  Chief complaint:    Patient being seen for concern for sleep disordered breathing  HPI:  Recently diagnosed with atrial fibrillation, scheduled for an ablation  Admits to snoring, no witnessed apneas Wakes up thirsty, no significant dryness of the mouth in the mornings No morning headaches Occasional night sweats Memory is poor Focus is usually good  Notes difficulty driving  Usually goes to bed between 8 and 9 PM Falls asleep after about 30 minutes About 2 awakenings Wake up time about 4:30 in the morning  Has managed to lose about 30 pounds recently  Never smoker Does have a sibling with sleep apnea  No pertinent occupational history  History of hypertension, diabetes, atrial fibrillation  Outpatient Encounter Medications as of 11/16/2023  Medication Sig   acetaminophen  (TYLENOL ) 500 MG tablet Take 500 mg by mouth every 6 (six) hours as needed for moderate pain (pain score 4-6).   cholecalciferol  (VITAMIN D3) 25 MCG (1000 UNIT) tablet Take 1,000 Units by mouth daily.   Dulaglutide  (TRULICITY ) 0.75 MG/0.5ML SOAJ Inject 0.75 mg into the skin once a week.   hydrochlorothiazide  (HYDRODIURIL ) 25 MG tablet Take 1 tablet (25 mg total) by mouth daily.   meclizine  (ANTIVERT ) 25 MG tablet Take 1 tablet (25 mg total) by mouth 3 (three) times daily as needed for dizziness.   rivaroxaban  (XARELTO ) 20 MG TABS tablet Take 1 tablet (20 mg total) by mouth daily with supper.   spironolactone  (ALDACTONE ) 50 MG tablet Take 1 tablet (50 mg total) by mouth at bedtime.   No facility-administered encounter medications on file as of 11/16/2023.    Allergies as of 11/16/2023 - Review Complete 11/16/2023  Allergen Reaction Noted   Tetracycline hcl Shortness Of  Breath, Swelling, and Other (See Comments) 06/09/2005   Angiotensin receptor blockers Swelling 06/16/2016   Nickel Itching and Dermatitis 08/23/2017   Sulfamethoxazole-trimethoprim Itching, Swelling, and Other (See Comments) 06/09/2005   Amlodipine  Other (See Comments) 07/31/2023   Crestor  [rosuvastatin  calcium ] Other (See Comments) 08/03/2023    Past Medical History:  Diagnosis Date   A-fib (HCC)    Allergy     Arthritis    Lt ankle   Chronic kidney disease    right renal cyst    GERD (gastroesophageal reflux disease)    hx of no problems at present    Headache(784.0)    occasional headache    Hypertension    Influenza 04/20/2012   PONV (postoperative nausea and vomiting)    N/V and trouble urinating after surgery     Past Surgical History:  Procedure Laterality Date   BREAST EXCISIONAL BIOPSY Left    BREAST LUMPECTOMY     BREAST SURGERY  2010   lumpectomy Lt breast   CARPAL TUNNEL RELEASE Right 08/06/2015   Procedure: RIGHT CARPAL TUNNEL RELEASE;  Surgeon: Franky Curia, MD;  Location: [facility];  Service: Orthopedics;  Laterality: Right;   DORSAL COMPARTMENT RELEASE Right 08/06/2015   Procedure: RIGHT FIRST RELEASE DORSAL COMPARTMENT (DEQUERVAIN);  Surgeon: Franky Curia, MD;  Location: [facility];  Service: Orthopedics;  Laterality: Right;   ENDOMETRIAL ABLATION  10/18/2010   at [facility]   FRACTURE SURGERY  2011   Lt ankle surgeries  x 3    GANGLION CYST EXCISION Right 07/02/2021   Procedure: RIGHT WRIST EXCISION DORSAL GANGLION;  Surgeon: Murrell Drivers, MD;  Location: [facility];  Service: Orthopedics;  Laterality: Right;   GANGLION CYST EXCISION Right 03/10/2022   Procedure: RIGHT WRIST EXCISION RECURRENT DORSAL GANGLION;  Surgeon: Murrell Drivers, MD;  Location: [facility];  Service: Orthopedics;  Laterality: Right;  45 MIN   JOINT REPLACEMENT     left ankle 8/12    LAPAROSCOPIC NEPHRECTOMY  03/09/2011   Procedure:  LAPAROSCOPIC NEPHRECTOMY;  Surgeon: Garnette Shack;  Location: [facility];  Service: Urology;  Laterality: Right;  Laparoscopic Assisted Removal Of Renal Cyst    OTHER SURGICAL HISTORY     2 hand surgeries on left and shoulder surgery on right shoulder    TOTAL ANKLE ARTHROPLASTY Left 07/05/2012   Procedure: Removal of total ankle implants,I & D with insertion of  new poly spacer;  Surgeon: Norleen Armor, MD;  Location: [facility];  Service: Orthopedics;  Laterality: Left;   TUBAL LIGATION  1988    Family History  Problem Relation Age of Onset   Diabetes Mother    Heart disease Mother    Hypertension Father    Diabetes Sister    Kidney disease Sister    Hypertension Brother    Kidney disease Brother    Arthritis Brother    Cancer Maternal Grandmother    Diabetes Sister    Hypertension Sister    Hypertension Sister    Hypertension Sister    Hypertension Brother    Mental illness Brother    Breast cancer Niece    Colon cancer Neg Hx     Social History   Socioeconomic History   Marital status: Divorced    Spouse name: Not on file   Number of children: 2   Years of education: Not on file   Highest education level: GED or equivalent  Occupational History   Not on file  Tobacco Use   Smoking status: Never    Passive exposure: Never   Smokeless tobacco: Never  Vaping Use   Vaping status: Never Used  Substance and Sexual Activity   Alcohol use: No    Alcohol/week: 0.0 standard drinks of alcohol   Drug use: No   Sexual activity: Not Currently  Other Topics Concern   Not on file  Social History Narrative   Not on file   Social Drivers of Health   Financial Resource Strain: Medium Risk (10/23/2023)   Overall Financial Resource Strain (CARDIA)    Difficulty of Paying Living Expenses: Somewhat hard  Food Insecurity: No Food Insecurity (10/23/2023)   Hunger Vital Sign    Worried About Running Out of Food in the Last Year: Never true    Ran Out of Food in the Last Year: Never  true  Recent Concern: Food Insecurity - Food Insecurity Present (08/03/2023)   Hunger Vital Sign    Worried About Running Out of Food in the Last Year: Sometimes true    Ran Out of Food in the Last Year: Never true  Transportation Needs: No Transportation Needs (10/23/2023)   PRAPARE - Administrator, Civil Service (Medical): No    Lack of Transportation (Non-Medical): No  Physical Activity: Inactive (10/23/2023)   Exercise Vital Sign    Days of Exercise per Week: 0 days    Minutes of Exercise per Session: Not on file  Stress: No Stress Concern Present (10/23/2023)   [employer]  of Occupational Health - Occupational Stress Questionnaire    Feeling of Stress: Only a little  Social Connections: Moderately Isolated (10/23/2023)   Social Connection and Isolation Panel    Frequency of Communication with Friends and Family: More than three times a week    Frequency of Social Gatherings with Friends and Family: Once a week    Attends Religious Services: 1 to 4 times per year    Active Member of [employer] or Organizations: No    Attends Banker Meetings: Not on file    Marital Status: Divorced  Intimate Partner Violence: Not At Risk (08/03/2023)   Humiliation, Afraid, Rape, and Kick questionnaire    Fear of Current or Ex-Partner: No    Emotionally Abused: No    Physically Abused: No    Sexually Abused: No    Review of Systems  Constitutional:  Negative for fatigue.  Respiratory:  Negative for cough and shortness of breath.   Psychiatric/Behavioral:  Positive for sleep disturbance.     Vitals:   11/16/23 1538  BP: 126/82  Pulse: (!) 57  SpO2: 99%     Physical Exam Constitutional:      Appearance: She is obese.  HENT:     Head: Normocephalic.     Mouth/Throat:     Mouth: Mucous membranes are moist.  Eyes:     General: No scleral icterus. Cardiovascular:     Rate and Rhythm: Normal rate and regular rhythm.     Heart sounds: No murmur heard. Pulmonary:      Effort: No respiratory distress.     Breath sounds: No stridor. No wheezing or rhonchi.  Musculoskeletal:     Cervical back: No rigidity or tenderness.  Neurological:     Mental Status: She is alert.  Psychiatric:        Mood and Affect: Mood normal.        11/16/2023    3:00 PM  Results of the Epworth flowsheet  Sitting and reading 2  Watching TV 2  Sitting, inactive in a public place (e.g. a theatre or a meeting) 2  As a passenger in a car for an hour without a break 2  Lying down to rest in the afternoon when circumstances permit 2  Sitting and talking to someone 1  Sitting quietly after a lunch without alcohol 0  In a car, while stopped for a few minutes in traffic 0  Total score 11    Data Reviewed: Echocardiogram 08/03/2023 with normal ejection fraction, normal right side of the heart  Chest x-ray 10/24/2023-reviewed by myself-no acute infiltrate  Assessment:  Excessive daytime sleepiness  Snoring, nonrestorative sleep - Moderate probability of significant sleep disordered breathing  Recently diagnosed with A-fib with RVR  Pathophysiology of sleep disordered breathing discussed  Sleep onset/sleep maintenance insomnia - Not able to quieten the mind for sleep  Plan/Recommendations: Schedule patient for home sleep study  Continue weight loss efforts  Optimize sleep hygiene  Risk with not treating sleep disordered breathing especially with reference to atrial fibrillation discussed     Jennet Epley MD [facility] 11/16/2023, 3:59 PM  CC: Anders Otto DASEN, MD

## 2023-11-16 NOTE — Patient Instructions (Signed)
 Scheduled for home sleep testing  We will update your results as soon as reviewed  Good luck with your ablation  Call us  with significant concerns  Tentative follow-up in 3 months

## 2023-11-16 NOTE — Assessment & Plan Note (Signed)
 Hypertension recently evaluated and controlled on combination medication regimen.  BP at goal < 130/80  mmHg. Medication adherence appears good. Concern for SCr elevation from 0.7 to 1.44 Patient had arrived after a long day at work prior to last visit and believes she may have been dehydrated that day.  - no change today.  - BMET today.  -Patient understanding of lab abnormality and reason for repeat assessment.

## 2023-11-16 NOTE — Telephone Encounter (Signed)
 Patient returns Dr. Kovals call to nurse line.   She reports she viewed the message PCP sent her on mychart.   She reports she does not want to wait a week for repeat labs and possible medication changes. She reports she does not want to do further damage.   She is requesting a return call from PCP.   Encouraged hydration.   Will forward to PCP.

## 2023-11-17 LAB — BASIC METABOLIC PANEL WITH GFR
BUN/Creatinine Ratio: 18 (ref 9–23)
BUN: 18 mg/dL (ref 6–24)
CO2: 22 mmol/L (ref 20–29)
Calcium: 9.7 mg/dL (ref 8.7–10.2)
Chloride: 102 mmol/L (ref 96–106)
Creatinine, Ser: 0.98 mg/dL (ref 0.57–1.00)
Glucose: 97 mg/dL (ref 70–99)
Potassium: 4 mmol/L (ref 3.5–5.2)
Sodium: 137 mmol/L (ref 134–144)
eGFR: 66 mL/min/1.73 (ref >59)

## 2023-11-17 NOTE — Progress Notes (Signed)
 Reviewed and agree with Dr Macky Lower plan.

## 2023-11-20 ENCOUNTER — Ambulatory Visit: Attending: Pharmacist | Admitting: Pharmacist

## 2023-11-20 NOTE — Progress Notes (Deleted)
 Patient ID: [name]                 DOB: [birth year]                      MRN: [id number]      HPI: Jennavecia Schwier is a 58 y.o. female referred by Dr. Cindie  to HTN clinic. PMH is significant for HTN, DM2, atrial fibrillation. Admitted 4/3 - 08/04/2023 with new onset A-fib RVR.  Metoprolol  decreased to 25 mg daily due to bradycardia.  Xarelto  20 mg daily initiated.  Echo LVEF 60 to 65%, mild LVH.  Indapamide  spironolactone  held due to normotension. At visit with Catilin Walker, NP metoprolol  dose was further reduced to 12.5 mg due to bradycardia and spironolactone  25 mg  was continued    Patient saw Dr.Lambert on 09/21/23 for an evaluation of atrial fibrillation.  Scheduled for Atrial Fibrillation Ablation on Wednesday, August 13 with Dr. Cindie. Pt's BP was elevated so low dose hydrochlorothiazide  was initiated.   At last visit with me correct BP measurement steps were discussed and we discussed to watched for hidden sodium in food. BP in the office was near normal.  The patient presented today for a follow-up visit regarding hypertension management. She reported that earlier this morning she visited her PCP and was fitted with a continuous blood pressure monitoring device. Over the past two weeks, she has noticed significant fluctuations in her blood pressure, which she attributes to recent heat exposure. Home BP readings ranged from a low of 126/70 to a high of 160/101. She noted that the cooling system at her workplace had broken, and her home air conditioning was unavailable due to scheduled maintenance and cleaning by the landlord, which added to her stress. Despite these environmental stressors, she reports taking her current antihypertensive medications as prescribed and tolerates them well without any side effects.   Current HTN meds: hydrochlorothiazide  12.5 mg daily and spironolactone  25 mg daily  Previously tried: indapamide - low potassium, Toprol  XL >12.5 mg -  bradycardia, losartan  - angioedema, atenolol- extreme tiredness, amlodipine  2.5 mg - ankle swelling  BP goal: <130/80   CrCl 80 mL/min     Family History:  Relation Problem Comments  Mother (Deceased) Diabetes   Heart disease     Father (Deceased) Hypertension     Sister (Deceased) Diabetes   Kidney disease     Sister - Sales executive Metallurgist) Diabetes     Sister - Careers information officer (Alive) Hypertension     Sister - Primary school teacher (Alive) Hypertension     Sister - Web designer (Alive) Hypertension     Brother - greg Metallurgist) Arthritis   Hypertension   Kidney disease     Brother - timothy Metallurgist) Hypertension   Mental illness     Maternal Grandmother (Deceased)      Social History:  Alcohol: none Smoking: never  Recreational drug use: none   Diet: fast food -eats out  Sandwich in the morning and supper  [place] chicken salad,  Cookies and cakes, chips -sometimes  Drink- water, slush- once a month or cheer wine - once a month   Exercise:  20 min per day 4 days per week   Home BP readings: recalls only one reading    Wt Readings from Last 3 Encounters:  11/16/23 206 lb 3.2 oz (93.5 kg)  11/14/23 205 lb 9.6 oz (93.3 kg)  10/27/23 202 lb 3.2 oz (91.7 kg)   BP Readings from Last 3 Encounters:  11/16/23 126/82  11/14/23 123/74  10/30/23 (!) 145/70   Pulse Readings from Last 3 Encounters:  11/16/23 (!) 57  11/14/23 88  10/30/23 (!) 51    Renal function: Estimated Creatinine Clearance: 73.9 mL/min (by C-G formula based on SCr of 0.98 mg/dL).  Past Medical History:  Diagnosis Date   A-fib (HCC)    Allergy     Arthritis    Lt ankle   Chronic kidney disease    right renal cyst    GERD (gastroesophageal reflux disease)    hx of no problems at present    Headache(784.0)    occasional headache    Hypertension    Influenza 04/20/2012   PONV (postoperative nausea and vomiting)    N/V and trouble urinating after surgery     Current Outpatient Medications on File Prior to Visit   Medication Sig Dispense Refill   acetaminophen  (TYLENOL ) 500 MG tablet Take 500 mg by mouth every 6 (six) hours as needed for moderate pain (pain score 4-6).     cholecalciferol  (VITAMIN D3) 25 MCG (1000 UNIT) tablet Take 1,000 Units by mouth daily.     Dulaglutide  (TRULICITY ) 0.75 MG/0.5ML SOAJ Inject 0.75 mg into the skin once a week. 2 mL 3   hydrochlorothiazide  (HYDRODIURIL ) 25 MG tablet Take 1 tablet (25 mg total) by mouth daily. 90 tablet 3   meclizine  (ANTIVERT ) 25 MG tablet Take 1 tablet (25 mg total) by mouth 3 (three) times daily as needed for dizziness. 30 tablet 0   rivaroxaban  (XARELTO ) 20 MG TABS tablet Take 1 tablet (20 mg total) by mouth daily with supper. 90 tablet 3   spironolactone  (ALDACTONE ) 50 MG tablet Take 1 tablet (50 mg total) by mouth at bedtime. 90 tablet 3   No current facility-administered medications on file prior to visit.    Allergies  Allergen Reactions   Tetracycline Hcl Shortness Of Breath, Swelling and Other (See Comments)    Large purple bruise   Angiotensin Receptor Blockers Swelling    Had episode of angioedema on Losartan     Nickel Itching and Dermatitis   Sulfamethoxazole-Trimethoprim Itching, Swelling and Other (See Comments)    Purple bruise   Amlodipine  Other (See Comments)    Leg edema and bradycardia per documentation   Crestor  [Rosuvastatin  Calcium ] Other (See Comments)    Muscle pains    There were no vitals taken for this visit.   Assessment/Plan:  1. Hypertension -  No problem-specific Assessment & Plan notes found for this encounter.      Thank you  Robbi Blanch, Pharm.D [facility]. [facility] [address], [place], [place] [address] Phone: [phone]; Fax: [phone]

## 2023-11-21 ENCOUNTER — Encounter: Payer: Self-pay | Admitting: Pharmacist

## 2023-11-23 ENCOUNTER — Other Ambulatory Visit: Payer: Self-pay | Admitting: Family Medicine

## 2023-11-23 MED ORDER — BACLOFEN 10 MG PO TABS: 10.0000 mg | ORAL_TABLET | Freq: Two times a day (BID) | ORAL | 0 refills | Status: AC | PRN

## 2023-11-24 ENCOUNTER — Ambulatory Visit (HOSPITAL_BASED_OUTPATIENT_CLINIC_OR_DEPARTMENT_OTHER): Admitting: Family

## 2023-11-24 ENCOUNTER — Ambulatory Visit (INDEPENDENT_AMBULATORY_CARE_PROVIDER_SITE_OTHER): Admitting: Pharmacist

## 2023-11-24 ENCOUNTER — Telehealth: Payer: Self-pay | Admitting: Cardiology

## 2023-11-24 ENCOUNTER — Encounter (HOSPITAL_BASED_OUTPATIENT_CLINIC_OR_DEPARTMENT_OTHER): Payer: Self-pay | Admitting: *Deleted

## 2023-11-24 ENCOUNTER — Encounter: Payer: Self-pay | Admitting: Pharmacist

## 2023-11-24 ENCOUNTER — Ambulatory Visit (HOSPITAL_COMMUNITY)
Admission: RE | Admit: 2023-11-24 | Discharge: 2023-11-24 | Disposition: A | Discharge status: Home / Self Care | Source: Ambulatory Visit | Attending: Cardiology | Admitting: Cardiology

## 2023-11-24 ENCOUNTER — Ambulatory Visit

## 2023-11-24 VITALS — BP 138/72 | HR 44 | Wt 200.8 lb

## 2023-11-24 VITALS — BP 122/74 | HR 48 | Ht 68.0 in | Wt 200.0 lb

## 2023-11-24 DIAGNOSIS — D6859 Other primary thrombophilia: Secondary | ICD-10-CM

## 2023-11-24 DIAGNOSIS — I10 Essential (primary) hypertension: Secondary | ICD-10-CM

## 2023-11-24 DIAGNOSIS — I281 Aneurysm of pulmonary artery: Secondary | ICD-10-CM | POA: Diagnosis not present

## 2023-11-24 DIAGNOSIS — I517 Cardiomegaly: Secondary | ICD-10-CM | POA: Insufficient documentation

## 2023-11-24 DIAGNOSIS — Z0279 Encounter for issue of other medical certificate: Secondary | ICD-10-CM

## 2023-11-24 DIAGNOSIS — I480 Paroxysmal atrial fibrillation: Secondary | ICD-10-CM | POA: Diagnosis not present

## 2023-11-24 DIAGNOSIS — I4819 Other persistent atrial fibrillation: Secondary | ICD-10-CM | POA: Diagnosis not present

## 2023-11-24 MED ORDER — IOHEXOL 350 MG/ML SOLN
80.0000 mL | Freq: Once | INTRAVENOUS | Status: AC | PRN
  Administered 2023-11-24: 80 mL via INTRAVENOUS

## 2023-11-24 NOTE — Progress Notes (Signed)
 S:     Chief Complaint  Patient presents with   Medication Management    Hypertension - Muscle Cramps   58 y.o. female who presents for hypertension evaluation, education, and management.   Patient was referred and last seen by Primary Care Provider, Dr. Anders, on 10/27/2023.  At last visit, with pharmacist for blood pressure, she was controlled.  Lab work on repeat was found to be stable.   Over the last several days patient has been experiencing back, leg and hand cramps.  At this time, she has not pick-ed up or trialed baclfen.  She is willing to pick this up and trial this for relief.    Today, patient arrives in good spirits and presents without  assistance.  Denies dizziness, headache, blurred vision, swelling.   Medication adherence reported as good.     Current antihypertensives include: hydrochlorothiazide  25mg  daily, spironolactone  50mg  daily  Reported home blood pressure readings: have been well controlled with systolic readings in the 130s per patient report.    O:  Review of Systems  Musculoskeletal:        Muscle cramps intermittent  All other systems reviewed and are negative.   Physical Exam Vitals reviewed.  Constitutional:      Appearance: Normal appearance.  Pulmonary:     Effort: Pulmonary effort is normal.  Neurological:     Mental Status: She is alert.  Psychiatric:        Mood and Affect: Mood normal.        Behavior: Behavior normal.        Thought Content: Thought content normal.        Judgment: Judgment normal.     Last 3 Office BP readings: BP Readings from Last 3 Encounters:  11/24/23 138/72  11/16/23 126/82  11/14/23 123/74    BMET    Component Value Date/Time   NA 137 11/16/2023 1558   NA 136 03/18/2009 1439   K 4.0 11/16/2023 1558   K 3.0 (L) 03/18/2009 1439   CL 102 11/16/2023 1558   CL 99 03/18/2009 1439   CO2 22 11/16/2023 1558   CO2 29 03/18/2009 1439   GLUCOSE 97 11/16/2023 1558   GLUCOSE 92 10/24/2023 1305    GLUCOSE 97 03/18/2009 1439   BUN 18 11/16/2023 1558   BUN 13 03/18/2009 1439   CREATININE 0.98 11/16/2023 1558   CREATININE 0.77 03/09/2016 0910   CALCIUM  9.7 11/16/2023 1558   CALCIUM  8.9 03/18/2009 1439   GFRNONAA >60 10/24/2023 1305   GFRAA 89 06/05/2020 1626    Renal function: Estimated Creatinine Clearance: 73.9 mL/min (by C-G formula based on SCr of 0.98 mg/dL).   A/P: Hypertension currently with improved control  on current medications. BP goal < 130/80 mmHg. Medication adherence appears good. Complaints of muscle cramping possibly related to electrolytes was discussed.  Patient consuming ~ 100 ounces of water per day.  Recent normal electrolyte evaluation is reassuring.  - Continue current regimen - hydrochlorothiazide  25mg  and spironolactone  50mg  daily - Encouraged to balance water intake with some electrolyte replacement in the form of a ZERO calorie replacement beverage like gatorade zero or powerade zero as this may resolve symptoms.  - Enouraged to pick-up and trial the baclofen  -Patient educated on purpose, proper use, and potential adverse effects.  -F/u labs if cramps continue next week.  - Discussed plan with Dr. Anders  Results reviewed and written information provided.    Written patient instructions provided. Patient verbalized understanding of treatment plan.  Total time in face to face counseling 18 minutes.    Follow-up:  Pharmacist PRN. Asked to follow-up in the next week if symptoms persist.

## 2023-11-24 NOTE — Patient Instructions (Signed)
 Medication Instructions:   Your physician recommends that you continue on your current medications as directed. Please refer to the Current Medication list given to you today.   *If you need a refill on your cardiac medications before your next appointment, please call your pharmacy*  Lab Work:  None ordered.  If you have labs (blood work) drawn today and your tests are completely normal, you will receive your results only by: MyChart Message (if you have MyChart) OR A paper copy in the mail If you have any lab test that is abnormal or we need to change your treatment, we will call you to review the results.  Testing/Procedures:  None ordered.   Follow-Up: At [facility], you and your health needs are our priority.  As part of our continuing mission to provide you with exceptional heart care, our providers are all part of one team.  This team includes your primary Cardiologist (physician) and Advanced Practice Providers or APPs (Physician Assistants and Nurse Practitioners) who all work together to provide you with the care you need, when you need it.  Your next appointment:   6 month(s)  Provider:   Shelda Bruckner, MD, Rosaline Bane, NP, or Reche Finder, NP    We recommend signing up for the patient portal called MyChart.  Sign up information is provided on this After Visit Summary.  MyChart is used to connect with patients for Virtual Visits (Telemedicine).  Patients are able to view lab/test results, encounter notes, upcoming appointments, etc.  Non-urgent messages can be sent to your provider as well.   To learn more about what you can do with MyChart, go to [email or web address].   Other Instructions  Your physician wants you to follow-up in: 6-9 months.  You will receive a reminder letter in the mail two months in advance. If you don't receive a letter, please call our office to schedule the follow-up appointment.  Patient given work note today.

## 2023-11-24 NOTE — Telephone Encounter (Signed)
 I received [employer] disability form today.  Patient signed the release of information and paid the $29 form fee.  Form in Dr. Hiram box.

## 2023-11-24 NOTE — Progress Notes (Signed)
 Reviewed and agree with Dr Macky Lower plan.

## 2023-11-24 NOTE — Assessment & Plan Note (Signed)
 Hypertension currently with improved control  on current medications. BP goal < 130/80 mmHg. Medication adherence appears good. Complaints of muscle cramping possibly related to electrolytes was discussed.  Patient consuming ~ 100 ounces of water per day.  Recent normal electrolyte evaluation is reassuring.  - Continue current regimen - hydrochlorothiazide  25mg  and spironolactone  50mg  daily - Encouraged to balance water intake with some electrolyte replacement in the form of a ZERO calorie replacement beverage like gatorade zero or powerade zero as this may resolve symptoms.  - Enouraged to pick-up and trial the baclofen  -Patient educated on purpose, proper use, and potential adverse effects.  -F/u labs if cramps continue next week.  - Discussed plan with Dr. Anders

## 2023-11-24 NOTE — Progress Notes (Signed)
  Cardiology Office Note:  .   Date:  11/24/2023  ID:  [name], DOB 1966/05/01, MRN [id number] PCP: Anders Otto DASEN, MD  [facility] Providers Cardiologist:  Shelda Bruckner, MD Electrophysiologist:  OLE DASEN HOLTS, MD    History of Present Illness: .   [name] is a 58 y.o. female with hx of HTN, DM2, atrial fibrillation.  Family history notable for youngest brother with a murmur, mother with MI, brother with heart failure.  Admitted 4/3 - 08/04/2023 with new onset A-fib RVR.  Metoprolol  decreased to 25 mg daily due to bradycardia.  Xarelto  20 mg daily initiated.  Echo LVEF 60 to 65%, mild LVH.  Indapamide  spironolactone  held due to normotension.  Seen 08/15/23. Due to bradycardia, metoprolol  succinate reduced to 12.5mg  daily. Referred to EP to discuss ablation which is scheduled for 12/13/23. At visit 10/27/23 with pahrmacist hydrochlorothiazide  25mg  daily added to antihypertensive regimen.   Presents today for follow up. BP at home 123/74 and has been overall well controlled. . Feeling overall well. Heart rate remains bradycardic but not as low as prior and no significant fatigue. She is excited for upcoming ablation. No chest pain, exertional dyspnea, near syncope, syncope.   Prior antihypertensives ACE/ARB - angioedema Amlodipine  - peripheral edema  ROS: Please see the history of present illness.    All other systems reviewed and are negative.   Studies Reviewed: .           Risk Assessment/Calculations:    CHA2DS2-VASc Score = 3   This indicates a 3.2% annual risk of stroke. The patient's score is based upon: CHF History: 0 HTN History: 1 Diabetes History: 1 Stroke History: 0 Vascular Disease History: 0 Age Score: 0 Gender Score: 1            Physical Exam:   VS:  BP 122/74   Pulse (!) 48   Ht 5' 8 (1.727 m)   Wt 200 lb (90.7 kg)   BMI 30.41 kg/m    Wt Readings from Last 3 Encounters:  11/24/23 200 lb (90.7 kg)   11/24/23 200 lb 12.8 oz (91.1 kg)  11/16/23 206 lb 3.2 oz (93.5 kg)    Vitals:   11/24/23 1006 11/24/23 1040  BP: (!) 140/80 122/74  Pulse: (!) 48   Height: 5' 8 (1.727 m)   Weight: 200 lb (90.7 kg)   BMI (Calculated): 30.42     GEN: Well nourished, well developed in no acute distress NECK: No JVD; No carotid bruits CARDIAC: RRR, no murmurs, rubs, gallops RESPIRATORY:  Clear to auscultation without rales, wheezing or rhonchi  ABDOMEN: Soft, non-tender, non-distended EXTREMITIES:  No edema; No deformity   ASSESSMENT AND PLAN: .    A-fib/hypercoagulable state- RRR by auscultation. Continue xarelto  20mg  daily. No longer on beta blocker therapy. Upcoming atrial fibrillation ablation with Dr. HOLTS.   HTN-BP at goal less than 130/80 by home readings and clinic readings. Continue spironolactone  25 mg daily, hydrochlorothiazide  25mg  daily. Discussed to monitor BP at home at least 2 hours after medications and sitting for 5-10 minutes.   DM2-Per primary care team.  HLD-03/2023 total cholesterol 121, HDL 40, LDL 59. Not presently on lipid lowering therapy. Continue periodic monitoring of lipids and ASCVD risk.        Dispo: follow up in 6-9 mos  Signed, Reche GORMAN Finder, NP

## 2023-11-24 NOTE — Patient Instructions (Signed)
 It was nice to see you today!  Your goal blood pressure is  <130/80 mm Hg     Medication Changes: - Please pick up and try the baclofen  for cramping relief  - Please try to increase electrolyte replacement in the form of a ZERO calorie electrolyte replacement like gatorade zero or powerade zero.   1 or 2 servings per day may help balance the electrolytes you are losing while drinking large amounts of water for hydration.   - Continue all other medication the same.  Keep up the good work with diet and exercise. Aim for a diet full of vegetables, fruit and lean meats (chicken, [place], fish). Try to limit salt intake by eating fresh or frozen vegetables (instead of canned), rinse canned vegetables prior to cooking and do not add any additional salt to meals.   Monitor blood pressure at home daily and keep a log (on your phone or piece of paper) to bring with you to your next visit. Write down date, time, blood pressure and pulse.   Please bring all medications to your clinic visits.  Please arrive 10-15 minutes prior to your scheduled visit time.

## 2023-11-28 ENCOUNTER — Encounter: Payer: Self-pay | Admitting: Family Medicine

## 2023-11-29 ENCOUNTER — Telehealth: Payer: Self-pay | Admitting: *Deleted

## 2023-11-29 ENCOUNTER — Encounter: Payer: Self-pay | Admitting: Family Medicine

## 2023-11-29 ENCOUNTER — Encounter (HOSPITAL_BASED_OUTPATIENT_CLINIC_OR_DEPARTMENT_OTHER): Payer: Self-pay | Admitting: Family

## 2023-11-29 DIAGNOSIS — R911 Solitary pulmonary nodule: Secondary | ICD-10-CM | POA: Insufficient documentation

## 2023-11-29 NOTE — Telephone Encounter (Signed)
 Pt aware of overread  recommendations and aware PCP office may call ./cy

## 2023-11-29 NOTE — Telephone Encounter (Signed)
 Radiology called re overread of CT it is showing Right Upper lobe nodule if no comparison needs F/U. Will bring to Dr Ramona attention and forward copy to PCP .lorrayne

## 2023-11-29 NOTE — Telephone Encounter (Signed)
Lm to call back ./cy 

## 2023-11-30 ENCOUNTER — Encounter (HOSPITAL_BASED_OUTPATIENT_CLINIC_OR_DEPARTMENT_OTHER): Payer: Self-pay

## 2023-12-06 ENCOUNTER — Telehealth (HOSPITAL_COMMUNITY): Payer: Self-pay

## 2023-12-06 ENCOUNTER — Telehealth (HOSPITAL_COMMUNITY): Payer: Self-pay | Admitting: *Deleted

## 2023-12-06 DIAGNOSIS — Z01812 Encounter for preprocedural laboratory examination: Secondary | ICD-10-CM

## 2023-12-06 DIAGNOSIS — I4819 Other persistent atrial fibrillation: Secondary | ICD-10-CM

## 2023-12-06 NOTE — Telephone Encounter (Signed)
 Spoke with patient to discuss upcoming procedure.   CT: completed.  Labs: CBC to be completed by 8/8.   Any recent signs of acute illness or been started on antibiotics? No Any new medications started? No Any medications to hold?  Hold Trulicity  for 1 week prior to the procedure- last dose on August 05.  Any missed doses of blood thinner? No Advised patient to continue taking ANTICOAGULANT: Xarelto  (Rivaroxaban ) daily without missing any doses.  Medication instructions:  On the morning of your procedure DO NOT take any medication., including Xarelto  or the procedure may be rescheduled. Nothing to eat or drink after midnight prior to your procedure.  Confirmed patient is scheduled for Atrial Fibrillation Ablation on Wednesday, August 13 with Dr. Ole Holts. Instructed patient to arrive at the Main Entrance A at [facility]: [address] [place], [place] [address] and check in at Admitting at 6:30 AM.  Advised of plan to go home the same day and will only stay overnight if medically necessary. You MUST have a responsible adult to drive you home and MUST be with you the first 24 hours after you arrive home or your procedure could be cancelled.  Patient verbalized understanding to all instructions provided and agreed to proceed with procedure.

## 2023-12-06 NOTE — Telephone Encounter (Signed)
 Left instructions for amyloid study on vm.

## 2023-12-07 ENCOUNTER — Other Ambulatory Visit: Payer: Self-pay | Admitting: Cardiology

## 2023-12-07 DIAGNOSIS — R9431 Abnormal electrocardiogram [ECG] [EKG]: Secondary | ICD-10-CM

## 2023-12-07 DIAGNOSIS — I517 Cardiomegaly: Secondary | ICD-10-CM

## 2023-12-08 ENCOUNTER — Ambulatory Visit (HOSPITAL_COMMUNITY)
Admission: RE | Admit: 2023-12-08 | Discharge: 2023-12-08 | Disposition: A | Discharge status: Home / Self Care | Source: Ambulatory Visit | Attending: Cardiovascular Disease | Admitting: Cardiovascular Disease

## 2023-12-08 DIAGNOSIS — R9431 Abnormal electrocardiogram [ECG] [EKG]: Secondary | ICD-10-CM | POA: Diagnosis not present

## 2023-12-08 DIAGNOSIS — I517 Cardiomegaly: Secondary | ICD-10-CM

## 2023-12-08 LAB — CBC
Hematocrit: 36 % (ref 34.0–46.6)
Hemoglobin: 11.9 g/dL (ref 11.1–15.9)
MCH: 26.9 pg (ref 26.6–33.0)
MCHC: 33.1 g/dL (ref 31.5–35.7)
MCV: 81 fL (ref 79–97)
Platelets: 285 x10E3/uL (ref 150–450)
RBC: 4.43 x10E6/uL (ref 3.77–5.28)
RDW: 14.3 % (ref 11.7–15.4)
WBC: 5.2 x10E3/uL (ref 3.4–10.8)

## 2023-12-08 MED ORDER — TECHNETIUM TC 99M PYROPHOSPHATE
21.8000 | Freq: Once | INTRAVENOUS | Status: AC
  Administered 2023-12-08: 21.8 via INTRAVENOUS

## 2023-12-09 LAB — MYOCARDIAL AMYLOID PLANAR & SPECT: H/CL Ratio: 1.28

## 2023-12-11 ENCOUNTER — Ambulatory Visit: Payer: Self-pay | Admitting: Cardiology

## 2023-12-12 NOTE — Anesthesia Preprocedure Evaluation (Addendum)
 Anesthesia Evaluation  Patient identified by MRN, date of birth, ID band Patient awake    Reviewed: Allergy  & Precautions, NPO status , Patient's Chart, lab work & pertinent test results  History of Anesthesia Complications (+) PONV and history of anesthetic complications  Airway Mallampati: III  TM Distance: >3 FB Neck ROM: Full    Dental no notable dental hx. (+) Dental Advisory Given, Teeth Intact,    Pulmonary neg pulmonary ROS, neg shortness of breath, neg sleep apnea, neg COPD, neg recent URI   Pulmonary exam normal breath sounds clear to auscultation       Cardiovascular hypertension, Pt. on medications (-) angina (-) Past MI, (-) CHF and (-) DOE  Rhythm:Regular Rate:Bradycardia  Echo 08/2023  1. Left ventricular ejection fraction, by estimation, is 60 to 65%. The left ventricle has normal function. The left ventricle has no regional wall motion abnormalities. There is mild left ventricular hypertrophy. Left ventricular diastolic function could not be evaluated.   2. Right ventricular systolic function was not well visualized. The right ventricular size is not well visualized.   3. The mitral valve is grossly normal. No evidence of mitral valve regurgitation.   4. The aortic valve is tricuspid. Aortic valve regurgitation is not visualized.   5. Rhythm strip during this exam demonstrates atrial fibrillation and with RVR.   Comparison(s): No prior Echocardiogram.      Neuro/Psych  Headaches  negative psych ROS   GI/Hepatic Neg liver ROS,GERD  ,,  Endo/Other  diabetes    Renal/GU Renal diseaseLab Results      Component                Value               Date                      CREATININE               0.91                03/07/2022                Musculoskeletal  (+) Arthritis ,    Abdominal  (+) + obese  Peds  Hematology negative hematology ROS (+) Lab Results      Component                Value                Date                      WBC                      5.5                 08/22/2020                HGB                      12.7                08/22/2020                HCT                      37.0  08/22/2020                MCV                      79.1 (L)            08/22/2020                PLT                      281                 08/22/2020              Anesthesia Other Findings   Reproductive/Obstetrics                              Anesthesia Physical Anesthesia Plan  ASA: 3  Anesthesia Plan: General   Post-op Pain Management: Tylenol  PO (pre-op)*   Induction: Intravenous  PONV Risk Score and Plan: 4 or greater and Ondansetron , Dexamethasone , Scopolamine  patch - Pre-op, Propofol  infusion, Treatment may vary due to age or medical condition, TIVA and Midazolam   Airway Management Planned: Oral ETT  Additional Equipment: None  Intra-op Plan:   Post-operative Plan: Extubation in OR  Informed Consent: I have reviewed the patients History and Physical, chart, labs and discussed the procedure including the risks, benefits and alternatives for the proposed anesthesia with the patient or authorized representative who has indicated his/her understanding and acceptance.     Dental advisory given  Plan Discussed with: CRNA  Anesthesia Plan Comments:          Anesthesia Quick Evaluation

## 2023-12-13 ENCOUNTER — Encounter (HOSPITAL_COMMUNITY)
Admission: RE | Disposition: A | Discharge status: Home / Self Care | Payer: Self-pay | Source: Home / Self Care | Attending: Cardiology

## 2023-12-13 ENCOUNTER — Ambulatory Visit (HOSPITAL_COMMUNITY): Payer: Self-pay | Admitting: Anesthesiology

## 2023-12-13 ENCOUNTER — Other Ambulatory Visit: Payer: Self-pay

## 2023-12-13 ENCOUNTER — Encounter (HOSPITAL_COMMUNITY): Payer: Self-pay | Admitting: Cardiology

## 2023-12-13 ENCOUNTER — Encounter (HOSPITAL_COMMUNITY): Payer: Self-pay | Admitting: Anesthesiology

## 2023-12-13 ENCOUNTER — Other Ambulatory Visit (HOSPITAL_COMMUNITY): Payer: Self-pay

## 2023-12-13 ENCOUNTER — Ambulatory Visit (HOSPITAL_COMMUNITY)
Admission: RE | Admit: 2023-12-13 | Discharge: 2023-12-13 | Disposition: A | Discharge status: Home / Self Care | Attending: Cardiology | Admitting: Cardiology

## 2023-12-13 DIAGNOSIS — Z79899 Other long term (current) drug therapy: Secondary | ICD-10-CM | POA: Insufficient documentation

## 2023-12-13 DIAGNOSIS — I4819 Other persistent atrial fibrillation: Secondary | ICD-10-CM | POA: Diagnosis not present

## 2023-12-13 DIAGNOSIS — I4891 Unspecified atrial fibrillation: Secondary | ICD-10-CM | POA: Diagnosis not present

## 2023-12-13 DIAGNOSIS — I495 Sick sinus syndrome: Secondary | ICD-10-CM | POA: Insufficient documentation

## 2023-12-13 DIAGNOSIS — I10 Essential (primary) hypertension: Secondary | ICD-10-CM | POA: Diagnosis not present

## 2023-12-13 DIAGNOSIS — Z7901 Long term (current) use of anticoagulants: Secondary | ICD-10-CM | POA: Diagnosis not present

## 2023-12-13 DIAGNOSIS — E119 Type 2 diabetes mellitus without complications: Secondary | ICD-10-CM | POA: Diagnosis not present

## 2023-12-13 DIAGNOSIS — Z8249 Family history of ischemic heart disease and other diseases of the circulatory system: Secondary | ICD-10-CM | POA: Diagnosis not present

## 2023-12-13 DIAGNOSIS — I119 Hypertensive heart disease without heart failure: Secondary | ICD-10-CM | POA: Diagnosis not present

## 2023-12-13 DIAGNOSIS — R9439 Abnormal result of other cardiovascular function study: Secondary | ICD-10-CM | POA: Insufficient documentation

## 2023-12-13 LAB — GLUCOSE, CAPILLARY: Glucose-Capillary: 107 mg/dL — ABNORMAL HIGH (ref 70–99)

## 2023-12-13 LAB — POCT ACTIVATED CLOTTING TIME: Activated Clotting Time: 342 s

## 2023-12-13 SURGERY — ATRIAL FIBRILLATION ABLATION
Anesthesia: General

## 2023-12-13 MED ORDER — SODIUM CHLORIDE 0.9% FLUSH: 3.0000 mL | Freq: Two times a day (BID) | INTRAVENOUS | Status: DC

## 2023-12-13 MED ORDER — SODIUM CHLORIDE 0.9% FLUSH: 3.0000 mL | INTRAVENOUS | Status: DC | PRN

## 2023-12-13 MED ORDER — PANTOPRAZOLE SODIUM 40 MG PO TBEC
40.0000 mg | DELAYED_RELEASE_TABLET | Freq: Every day | ORAL | 0 refills | Status: DC
  Filled 2023-12-13: qty 45, 45d supply, fill #0

## 2023-12-13 MED ORDER — ACETAMINOPHEN 325 MG PO TABS
650.0000 mg | ORAL_TABLET | ORAL | Status: DC | PRN
Start: 2023-12-13 — End: 2023-12-13
  Administered 2023-12-13 (×2): 650 mg via ORAL

## 2023-12-13 MED ORDER — FENTANYL CITRATE (PF) 250 MCG/5ML IJ SOLN
INTRAMUSCULAR | Status: DC | PRN
  Administered 2023-12-13 (×2): 100 ug via INTRAVENOUS

## 2023-12-13 MED ORDER — HEPARIN (PORCINE) IN NACL 1000-0.9 UT/500ML-% IV SOLN
INTRAVENOUS | Status: DC | PRN
  Administered 2023-12-13 (×4): 500 mL

## 2023-12-13 MED ORDER — ROCURONIUM BROMIDE 10 MG/ML (PF) SYRINGE
PREFILLED_SYRINGE | INTRAVENOUS | Status: DC | PRN
  Administered 2023-12-13: 70 mg via INTRAVENOUS
  Administered 2023-12-13: 10 mg via INTRAVENOUS
  Administered 2023-12-13: 70 mg via INTRAVENOUS
  Administered 2023-12-13: 10 mg via INTRAVENOUS

## 2023-12-13 MED ORDER — COLCHICINE 0.6 MG PO TABS
0.6000 mg | ORAL_TABLET | Freq: Two times a day (BID) | ORAL | Status: DC
  Administered 2023-12-13 (×2): 0.6 mg via ORAL
  Filled 2023-12-13: qty 1

## 2023-12-13 MED ORDER — MIDAZOLAM HCL 2 MG/2ML IJ SOLN
INTRAMUSCULAR | Status: DC | PRN
  Administered 2023-12-13 (×2): 2 mg via INTRAVENOUS

## 2023-12-13 MED ORDER — SODIUM CHLORIDE 0.9 % IV SOLN
250.0000 mL | INTRAVENOUS | Status: DC | PRN
Start: 2023-12-13 — End: 2023-12-13

## 2023-12-13 MED ORDER — ACETAMINOPHEN 500 MG PO TABS
1000.0000 mg | ORAL_TABLET | Freq: Once | ORAL | Status: AC
  Administered 2023-12-13 (×2): 1000 mg via ORAL
  Filled 2023-12-13: qty 2

## 2023-12-13 MED ORDER — ONDANSETRON HCL 4 MG/2ML IJ SOLN
INTRAMUSCULAR | Status: DC | PRN
  Administered 2023-12-13 (×2): 4 mg via INTRAVENOUS

## 2023-12-13 MED ORDER — SCOPOLAMINE 1 MG/3DAYS TD PT72
MEDICATED_PATCH | TRANSDERMAL | Status: AC
  Filled 2023-12-13: qty 1

## 2023-12-13 MED ORDER — PANTOPRAZOLE SODIUM 40 MG PO TBEC
40.0000 mg | DELAYED_RELEASE_TABLET | Freq: Every day | ORAL | Status: DC
  Administered 2023-12-13 (×2): 40 mg via ORAL
  Filled 2023-12-13: qty 1

## 2023-12-13 MED ORDER — ATROPINE SULFATE 1 MG/10ML IJ SOSY
PREFILLED_SYRINGE | INTRAMUSCULAR | Status: DC | PRN
  Administered 2023-12-13 (×2): 1 mg via INTRAVENOUS

## 2023-12-13 MED ORDER — ONDANSETRON HCL 4 MG/2ML IJ SOLN: 4.0000 mg | Freq: Four times a day (QID) | INTRAMUSCULAR | Status: DC | PRN

## 2023-12-13 MED ORDER — ACETAMINOPHEN 325 MG PO TABS
ORAL_TABLET | ORAL | Status: AC
  Filled 2023-12-13: qty 2

## 2023-12-13 MED ORDER — DEXAMETHASONE SODIUM PHOSPHATE 10 MG/ML IJ SOLN
INTRAMUSCULAR | Status: DC | PRN
  Administered 2023-12-13 (×2): 5 mg via INTRAVENOUS

## 2023-12-13 MED ORDER — FENTANYL CITRATE (PF) 100 MCG/2ML IJ SOLN
INTRAMUSCULAR | Status: AC
  Filled 2023-12-13: qty 2

## 2023-12-13 MED ORDER — PROTAMINE SULFATE 10 MG/ML IV SOLN
INTRAVENOUS | Status: DC | PRN
Start: 2023-12-13 — End: 2023-12-13
  Administered 2023-12-13 (×2): 35 mg via INTRAVENOUS

## 2023-12-13 MED ORDER — HEPARIN SODIUM (PORCINE) 1000 UNIT/ML IJ SOLN
INTRAMUSCULAR | Status: DC | PRN
  Administered 2023-12-13 (×2): 14000 [IU] via INTRAVENOUS

## 2023-12-13 MED ORDER — LIDOCAINE 2% (20 MG/ML) 5 ML SYRINGE
INTRAMUSCULAR | Status: DC | PRN
  Administered 2023-12-13 (×2): 100 mg via INTRAVENOUS

## 2023-12-13 MED ORDER — ATROPINE SULFATE 1 MG/10ML IJ SOSY
PREFILLED_SYRINGE | INTRAMUSCULAR | Status: AC
  Filled 2023-12-13: qty 10

## 2023-12-13 MED ORDER — PROPOFOL 10 MG/ML IV BOLUS
INTRAVENOUS | Status: DC | PRN
  Administered 2023-12-13 (×2): 160 mg via INTRAVENOUS

## 2023-12-13 MED ORDER — SODIUM CHLORIDE 0.9 % IV SOLN: INTRAVENOUS | Status: DC

## 2023-12-13 MED ORDER — COLCHICINE 0.6 MG PO TABS
0.6000 mg | ORAL_TABLET | Freq: Two times a day (BID) | ORAL | 0 refills | Status: DC
  Filled 2023-12-13: qty 10, 5d supply, fill #0

## 2023-12-13 MED ORDER — PROPOFOL 500 MG/50ML IV EMUL
INTRAVENOUS | Status: DC | PRN
Start: 2023-12-13 — End: 2023-12-13
  Administered 2023-12-13 (×2): 100 ug/kg/min via INTRAVENOUS

## 2023-12-13 MED ORDER — SCOPOLAMINE 1 MG/3DAYS TD PT72
1.0000 | MEDICATED_PATCH | TRANSDERMAL | Status: DC
  Administered 2023-12-13 (×2): 1.5 mg via TRANSDERMAL

## 2023-12-13 MED ORDER — MIDAZOLAM HCL 2 MG/2ML IJ SOLN
INTRAMUSCULAR | Status: AC
  Filled 2023-12-13: qty 2

## 2023-12-13 MED ORDER — SUGAMMADEX SODIUM 200 MG/2ML IV SOLN
INTRAVENOUS | Status: DC | PRN
  Administered 2023-12-13 (×2): 200 mg via INTRAVENOUS

## 2023-12-13 SURGICAL SUPPLY — 19 items
BAG SNAP BAND KOVER 36X36 (MISCELLANEOUS) IMPLANT
BLANKET WARM UNDERBOD FULL ACC (MISCELLANEOUS) ×1 IMPLANT
CABLE FARASTAR GEN2 SNGL USE (CABLE) IMPLANT
CATH ACUNAV GE 8F-90 (CATHETERS) IMPLANT
CATH FARAWAVE NAV 31 (CATHETERS) IMPLANT
CATH POLARIS X 2.5/5/2.5 DECAP (CATHETERS) IMPLANT
CLOSURE PERCLOSE PROSTYLE (VASCULAR PRODUCTS) IMPLANT
COVER SWIFTLINK CONNECTOR (BAG) ×1 IMPLANT
DILATOR VESSEL 38 20CM 16FR (INTRODUCER) IMPLANT
GUIDEWIRE INQWIRE 1.5J.035X260 (WIRE) IMPLANT
KIT PATCH RHYTHMIA HDX (MISCELLANEOUS) IMPLANT
KIT VERSACROSS CNCT FARADRIVE (KITS) IMPLANT
MAT PREVALON FULL STRYKER (MISCELLANEOUS) IMPLANT
PACK EP LF (CUSTOM PROCEDURE TRAY) ×1 IMPLANT
PAD DEFIB RADIO PHYSIO CONN (PAD) ×1 IMPLANT
SHEATH FARADRIVE STEERABLE (SHEATH) IMPLANT
SHEATH PINNACLE 8F 10CM (SHEATH) IMPLANT
SHEATH PINNACLE 9F 10CM (SHEATH) IMPLANT
SHEATH PROBE COVER 6X72 (BAG) IMPLANT

## 2023-12-13 NOTE — H&P (Signed)
 Electrophysiology Office Note:     Date:  12/13/2023    ID:  [name], DOB 17-Jan-1966, MRN [id number]   [facility] Cardiologist:  Shelda Bruckner, MD  [facility] Electrophysiologist:  OLE ONEIDA HOLTS, MD    Referring MD: Vannie Reche RAMAN, NP    Chief Complaint: Atrial fibrillation   History of Present Illness:     [name] is a 58 year old woman who I am seeing today for an evaluation of atrial fibrillation at the request of Reche Vannie, NP.  The patient has a history of hypertension, diabetes, atrial fibrillation.  She was admitted in April of this year with new onset atrial fibrillation with rapid ventricular rate.  Beta-blockers were started but had to be down titrated due to bradycardia.  She is on Xarelto  for stroke prophylaxis.  She has normal left ventricular function.  She is referred to discuss catheter ablation of her atrial fibrillation.   Her mother passed at 11yo from MI. Her brother died from HF at age 85. Her other brother died of HF at 16. She has noted a progressive slowing of the HR.  Presents for AF ablation. Procedure reviewed.  Objective Their past medical, social and family history was reviewed.     ROS:   Please see the history of present illness.    All other systems reviewed and are negative.   EKGs/Labs/Other Studies Reviewed:     The following studies were reviewed today:   Sep 18, 2023 ZIO monitor Heart rate 33-1 42, average 56 No atrial fibrillation Occasional supraventricular ectopy, 1.3% Rare ventricular ectopy   August 03, 2023 echo EF 60% RV Nishan not well-visualized No significant valvular abnormalities   Sep 18, 2023 EKG shows sinus bradycardia, ventricular rate 48 bpm.  No preexcitation.  Normal intervals.   August 03, 2023 EKG shows atrial fibrillation with a rapid ventricular rate, 150 bpm EKG Interpretation Date/Time:                  Thursday Sep 21 2023 11:10:37 EDT Ventricular Rate:          39 PR Interval:                 136 QRS Duration:             86 QT Interval:                 458 QTC Calculation:368 R Axis:                         14   Text Interpretation:Marked sinus bradycardia Confirmed by HOLTS OLE [phone]) on 09/21/2023 11:26:08 AM      Physical Exam:     VS:  BP 162/73   Pulse 49   Ht 5' 8 (1.727 m)   Wt 208 lb (94.3 kg)   SpO2 99%   BMI 31.63 kg/m      Recheck confirmed BP 182/76      Wt Readings from Last 3 Encounters:  09/21/23 208 lb (94.3 kg)  09/18/23 205 lb (93 kg)  09/01/23 208 lb (94.3 kg)      GEN: no distress CARD: RRR, No MRG RESP: No IWOB. CTAB.      Assessment ASSESSMENT AND PLAN:     1. PAF (paroxysmal atrial fibrillation) (HCC)   2. Left ventricular hypertrophy   3. Abnormal electrocardiogram (ECG) (EKG)   4. Essential hypertension       #Atrial fibrillation  Symptomatic.  Led to ER visit.  Has not tolerated higher dose beta-blocker due to symptomatic bradycardia.  I discussed treatment options today for her atrial fibrillation including conservative therapy, antiarrhythmic drugs and catheter ablation.  Her bradycardia is going to limit our ability to uptitrate beta-blockers or use antiarrhythmic drugs.  I discussed catheter ablation in detail today including the risks, recovery and likelihood of success.  She is interested in proceeding.   Discussed treatment options today for AF including antiarrhythmic drug therapy and ablation. Discussed risks, recovery and likelihood of success with each treatment strategy. Risk, benefits, and alternatives to EP study and ablation for afib were discussed. These risks include but are not limited to stroke, bleeding, vascular damage, tamponade, perforation, damage to the esophagus, lungs, phrenic nerve and other structures, pulmonary vein stenosis, worsening renal function, coronary vasospasm and death.  Discussed potential need for repeat ablation procedures and antiarrhythmic drugs  after an initial ablation. The patient understands these risk and wishes to proceed.  We will therefore proceed with catheter ablation at the next available time.  Carto, ICE, anesthesia are requested for the procedure.  Will also obtain CT PV protocol prior to the procedure to exclude LAA thrombus and further evaluate atrial anatomy.   Continue Xarelto  for stroke prophylaxis   #Hypertension Above goal today.  Recommend checking blood pressures 1-2 times per week at home and recording the values.  Recommend bringing these recordings to the primary care physician. Start hydrochlorothiazide  12.5mg  PO daily. Needs to see pharmD clinic in 3-5 days for further titration/monitoring of her K given past hypokalemia.   #Bradycardia Progressively worsening sinus node dysfunction. Given her HF family history and her progressive/significant bradycardia, I am concerned she could have amyloid. I am goin to order the PYP scan and blood work. We discussed pacing but with her HR during walking, I do not think it is currently indicated.   Presents for AF ablation. Procedure reviewed.    Signed, Ole DASEN. Cindie, MD, [facility] 12/13/2023 Electrophysiology [facility]

## 2023-12-13 NOTE — Transfer of Care (Signed)
 Immediate Anesthesia Transfer of Care Note  Patient: [name]  Procedure(s) Performed: ATRIAL FIBRILLATION ABLATION  Patient Location: PACU  Anesthesia Type:General  Level of Consciousness: drowsy  Airway & Oxygen Therapy: Patient Spontanous Breathing and Patient connected to face mask oxygen  Post-op Assessment: Report given to RN and Post -op Vital signs reviewed and stable  Post vital signs: Reviewed and stable  Last Vitals:  Vitals Value Taken Time  BP 155/82 12/13/23 09:54  Temp    Pulse    Resp 22 12/13/23 09:57  SpO2    Vitals shown include unfiled device data.  Last Pain:  Vitals:   12/13/23 9367  PainSc: 0-No pain         Complications: There were no known notable events for this encounter.

## 2023-12-13 NOTE — Anesthesia Procedure Notes (Signed)
 Procedure Name: Intubation Date/Time: 12/13/2023 8:36 AM  Performed by: Lakendria Nicastro J, CRNAPre-anesthesia Checklist: Patient identified, Emergency Drugs available, Suction available and Patient being monitored Patient Re-evaluated:Patient Re-evaluated prior to induction Oxygen Delivery Method: Circle System Utilized Preoxygenation: Pre-oxygenation with 100% oxygen Induction Type: IV induction Ventilation: Mask ventilation without difficulty Laryngoscope Size: Miller and 3 Grade View: Grade I Tube type: Oral Tube size: 7.0 mm Number of attempts: 1 Airway Equipment and Method: Stylet and Oral airway Placement Confirmation: ETT inserted through vocal cords under direct vision, positive ETCO2 and breath sounds checked- equal and bilateral Secured at: 21 cm Tube secured with: Tape Dental Injury: Teeth and Oropharynx as per pre-operative assessment

## 2023-12-13 NOTE — Anesthesia Postprocedure Evaluation (Signed)
 Anesthesia Post Note  Patient: [name]  Procedure(s) Performed: ATRIAL FIBRILLATION ABLATION     Patient location during evaluation: Cath Lab Anesthesia Type: General Level of consciousness: sedated and patient cooperative Pain management: pain level controlled Vital Signs Assessment: post-procedure vital signs reviewed and stable Respiratory status: spontaneous breathing Cardiovascular status: stable Anesthetic complications: no   There were no known notable events for this encounter.  Last Vitals:  Vitals:   12/13/23 1214 12/13/23 1300  BP: (!) 147/66 (!) 158/72  Pulse:  (!) 57  Resp: 20 17  Temp:    SpO2:  98%    Last Pain:  Vitals:   12/13/23 1014  PainSc: 10-Worst pain ever                 Norleen Pope

## 2023-12-13 NOTE — Discharge Instructions (Signed)

## 2023-12-14 ENCOUNTER — Telehealth (HOSPITAL_COMMUNITY): Payer: Self-pay

## 2023-12-14 ENCOUNTER — Telehealth: Payer: Self-pay | Admitting: Cardiology

## 2023-12-14 NOTE — Telephone Encounter (Signed)
 This has been responded to in another MyChart thread. See note from Carlyle Chauvigne, RN at 1:41 PM on 12/14/2023:   Chauvigne, Carlyle, RN to Copelyn Widmer    12/14/23  1:41 PM Hi Ms. Maris,   We are only able to write you out of work for one week after your procedure, so your return will be 8/21. I do have a printed copy of a work note for you if you would like to come by and pick it up. I am working on your disability forms and should have those completed by the end of the day today.    Thanks Carly  Last read by Grayce Rilla Croak at 3:37PM on 12/14/2023.

## 2023-12-14 NOTE — Telephone Encounter (Signed)
 Paperwork has been completed and placed in the box to be faxed.

## 2023-12-14 NOTE — Telephone Encounter (Signed)
 Spoke with patient to complete post procedure follow up call.  Patient reports no complications with groin sites.   Instructions reviewed with patient:  Remove large bandage at puncture site after 24 hours. It is normal to have bruising, tenderness, mild swelling, and a pea or marble sized lump/knot at the groin site which can take up to three months to resolve.  Get help right away if you notice sudden swelling at the puncture site.  Check your puncture site every day for signs of infection: fever, redness, swelling, pus drainage, warmth, foul odor or excessive pain. If this occurs, please call the office at [phone], to speak with the nurse. Get help right away if your puncture site is bleeding and the bleeding does not stop after applying firm pressure to the area.  You may continue to have skipped beats/ atrial fibrillation during the first several months after your procedure.  It is very important not to miss any doses of your blood thinner Xarelto .  You will follow up with the Afib clinic on 01/10/24 and follow up with the APP on 03/13/24.   Patient verbalized understanding to all instructions provided.

## 2023-12-14 NOTE — Telephone Encounter (Signed)
 Pt calling in for an update on [employer] disability paperwork. She states she has already paid to have it filled out, she is calling to see if its been completed.   She also asked if she can come pick up a work note.

## 2023-12-15 MED FILL — Fentanyl Citrate Preservative Free (PF) Inj 100 MCG/2ML: INTRAMUSCULAR | Qty: 2 | Status: AC

## 2023-12-15 NOTE — Telephone Encounter (Signed)
 Attending physician's statements faxed to [facility] and scanned into chart. Billing notified.

## 2023-12-30 ENCOUNTER — Other Ambulatory Visit: Payer: Self-pay | Admitting: Family Medicine

## 2023-12-30 DIAGNOSIS — E119 Type 2 diabetes mellitus without complications: Secondary | ICD-10-CM

## 2024-01-10 ENCOUNTER — Encounter (HOSPITAL_COMMUNITY): Payer: Self-pay | Admitting: Internal Medicine

## 2024-01-10 ENCOUNTER — Encounter (HOSPITAL_COMMUNITY): Payer: Self-pay

## 2024-01-10 ENCOUNTER — Ambulatory Visit (HOSPITAL_COMMUNITY)
Admission: RE | Admit: 2024-01-10 | Discharge: 2024-01-10 | Disposition: A | Discharge status: Home / Self Care | Source: Ambulatory Visit | Attending: Internal Medicine | Admitting: Internal Medicine

## 2024-01-10 VITALS — BP 136/80 | HR 101 | Ht 68.0 in | Wt 205.6 lb

## 2024-01-10 DIAGNOSIS — D6859 Other primary thrombophilia: Secondary | ICD-10-CM | POA: Diagnosis not present

## 2024-01-10 DIAGNOSIS — I4819 Other persistent atrial fibrillation: Secondary | ICD-10-CM | POA: Diagnosis not present

## 2024-01-10 NOTE — Progress Notes (Signed)
 Primary Care Physician: Orie Milda CROME, MD Primary Cardiologist: Shelda Bruckner, MD Electrophysiologist: OLE ONEIDA HOLTS, MD     Referring Physician: Dr. HOLTS [name] is a 58 y.o. female with a history of HTN, HLD, T2DM, and atrial fibrillation who presents for consultation in the [facility]. Patient is on Xarelto  20 mg daily for a CHADS2VASC score of 3.  On evaluation today, patient is currently in NSR. S/p Afib ablation on 12/13/23 by Dr. HOLTS. No episodes of Afib since ablation. She notes more energy and feels better. No chest pain or SOB. Leg sites healed without issue. No missed doses of anticoagulant.  Today, she denies symptoms of orthopnea, PND, lower extremity edema, dizziness, presyncope, syncope, snoring, daytime somnolence, bleeding, or neurologic sequela. The patient is tolerating medications without difficulties and is otherwise without complaint today.    she has a BMI of Body mass index is 31.26 kg/m.SABRA Filed Weights   01/10/24 1130  Weight: 93.3 kg    Current Outpatient Medications  Medication Sig Dispense Refill   acetaminophen  (TYLENOL ) 500 MG tablet Take 500 mg by mouth every 6 (six) hours as needed for moderate pain (pain score 4-6).     Dulaglutide  (TRULICITY ) 0.75 MG/0.5ML SOAJ Inject 0.75 mg into the skin every Tuesday. 2 mL 5   hydrochlorothiazide  (HYDRODIURIL ) 25 MG tablet Take 1 tablet (25 mg total) by mouth daily. 90 tablet 3   pantoprazole  (PROTONIX ) 40 MG tablet Take 1 tablet (40 mg total) by mouth daily. No refills necessary, post procedure medication. 45 tablet 0   rivaroxaban  (XARELTO ) 20 MG TABS tablet Take 1 tablet (20 mg total) by mouth daily with supper. 90 tablet 3   spironolactone  (ALDACTONE ) 50 MG tablet Take 1 tablet (50 mg total) by mouth at bedtime. 90 tablet 3   No current facility-administered medications for this encounter.    Atrial Fibrillation Management  history:  Previous antiarrhythmic drugs: none Previous cardioversions: none Previous ablations: 12/13/23 Anticoagulation history: Xarelto    ROS- All systems are reviewed and negative except as per the HPI above.  Physical Exam: BP 136/80   Pulse (!) 101   Ht 5' 8 (1.727 m)   Wt 93.3 kg   LMP 10/01/2010   BMI 31.26 kg/m   GEN: Well nourished, well developed in no acute distress NECK: No JVD; No carotid bruits CARDIAC: Regular rate and rhythm, no murmurs, rubs, gallops RESPIRATORY:  Clear to auscultation without rales, wheezing or rhonchi  ABDOMEN: Soft, non-tender, non-distended EXTREMITIES:  No edema; No deformity   EKG today demonstrates  Vent. rate 101 BPM PR interval 142 ms QRS duration 80 ms QT/QTcB 358/464 ms P-R-T axes 39 23 20 Sinus tachycardia Otherwise normal ECG When compared with ECG of 13-Dec-2023 09:59, No significant change was found  Echo 08/03/23 demonstrated   1. Left ventricular ejection fraction, by estimation, is 60 to 65%. The  left ventricle has normal function. The left ventricle has no regional  wall motion abnormalities. There is mild left ventricular hypertrophy.  Left ventricular diastolic function  could not be evaluated.   2. Right ventricular systolic function was not well visualized. The right  ventricular size is not well visualized.   3. The mitral valve is grossly normal. No evidence of mitral valve  regurgitation.   4. The aortic valve is tricuspid. Aortic valve regurgitation is not  visualized.   5. Rhythm strip during this exam demonstrates atrial fibrillation and  with RVR.  ASSESSMENT & PLAN CHA2DS2-VASc Score = 3  The patient's score is based upon: CHF History: 0 HTN History: 1 Diabetes History: 1 Stroke History: 0 Vascular Disease History: 0 Age Score: 0 Gender Score: 1       ASSESSMENT AND PLAN: Persistent Atrial Fibrillation (ICD10:  I48.19) The patient's CHA2DS2-VASc score is 3, indicating a 3.2% annual risk  of stroke.   S/p Afib ablation on 12/13/23 by Dr. Cindie.  Patient is currently in NSR. She is doing well overall.    Secondary Hypercoagulable State (ICD10:  D68.69) The patient is at significant risk for stroke/thromboembolism based upon her CHA2DS2-VASc Score of 3.  Continue Rivaroxaban  (Xarelto ).   Continue OAC.      Follow up with EP as scheduled.    Terra Pac, [facility]  Afib Clinic [address] [place], [place] [address] [phone]

## 2024-02-05 ENCOUNTER — Encounter (HOSPITAL_BASED_OUTPATIENT_CLINIC_OR_DEPARTMENT_OTHER): Payer: Self-pay

## 2024-02-05 ENCOUNTER — Encounter: Payer: Self-pay | Admitting: Family Medicine

## 2024-02-06 ENCOUNTER — Telehealth: Payer: Self-pay | Admitting: Podiatry

## 2024-02-06 NOTE — Telephone Encounter (Signed)
 Called to get patient scheduled. I left a voicemail requesting a call back.

## 2024-02-09 ENCOUNTER — Ambulatory Visit (INDEPENDENT_AMBULATORY_CARE_PROVIDER_SITE_OTHER)

## 2024-02-09 VITALS — BP 148/72 | HR 98 | Ht 68.0 in | Wt 198.2 lb

## 2024-02-09 DIAGNOSIS — I10 Essential (primary) hypertension: Secondary | ICD-10-CM

## 2024-02-09 DIAGNOSIS — I4819 Other persistent atrial fibrillation: Secondary | ICD-10-CM

## 2024-02-09 DIAGNOSIS — E1140 Type 2 diabetes mellitus with diabetic neuropathy, unspecified: Secondary | ICD-10-CM | POA: Diagnosis not present

## 2024-02-09 MED ORDER — GABAPENTIN 100 MG PO CAPS: 100.0000 mg | ORAL_CAPSULE | Freq: Three times a day (TID) | ORAL | 3 refills | Status: AC

## 2024-02-09 MED ORDER — SPIRONOLACTONE 50 MG PO TABS: 50.0000 mg | ORAL_TABLET | Freq: Every day | ORAL | 3 refills | Status: AC

## 2024-02-09 NOTE — Assessment & Plan Note (Addendum)
-   Continue trulicity . Will repeat A1c at her physical next month. Discussed numbness and tingling, likely diabetic neuropathy. Will trial gabapentin  100mg  at night. If no improvement, consider B12 and CBC to rule out other causes.  - Plans to get eye exam soon - Not currently on statin, will address at 1 month FU

## 2024-02-09 NOTE — Assessment & Plan Note (Deleted)
 SABRA

## 2024-02-09 NOTE — Assessment & Plan Note (Addendum)
 Elevated today, states her home Bps are normal. Will continue home meds. If home BP >140/80, she will call our office. Consider increasing at FU.

## 2024-02-09 NOTE — Patient Instructions (Addendum)
 It was good to see you today.   Please bring ALL of your medications with you to every visit.    Today we talked about: HTN- still elevated today, keep checking home BP and call for BP over 140/80 Afib- doing well, continue your eliquis DM2- doing well, continue trulicity      Thank you for choosing [facility]. Please refer to your mychart for specifics regarding today's visit or future appointments.

## 2024-02-09 NOTE — Assessment & Plan Note (Signed)
 Stable, FU cards

## 2024-02-09 NOTE — Progress Notes (Signed)
    SUBJECTIVE:   CHIEF COMPLAINT / HPI:   DM2 Lab Results  Component Value Date   HGBA1C 6.2 (H) 08/04/2023  - On Trulicity . States she did the injection in her leg this time and it caused some leg pain.  - Denies nausea, diarrhea  Foot Tingling - For a week or two, pins and needles feeling in toes and bottom of foot.  - No weakness. Has not noticed symptoms in hands or above foot - No trauma  HTN - On 25mg  hydrochlorothiazide  and 50mg  aldactone   BP Readings from Last 4 Encounters:  02/09/24 (!) 166/57  01/10/24 136/80  12/13/23 (!) 158/72  11/24/23 (!) 155/71  - Home numbers run 130s/70s.   Afib - had ablation in Aug, no episodes of afib since then, remains on xarelto  - denies palpitations  PERTINENT  PMH / PSH: HTN, DM2, Afib, HLD  OBJECTIVE:   BP (!) 166/57   Pulse 98   Ht 5' 8 (1.727 m)   Wt 198 lb 3.2 oz (89.9 kg)   LMP 10/01/2010   SpO2 100%   BMI 30.14 kg/m   Physical Exam General: Alert, conversant, cooperative. No acute distress.  HEENT: PERRL. EOMI. MMM.  Cardiovascular: RRR Respiratory: Lungs CTAB. Normal work of breathing. Abdomen: Non distended Extremities: No cyanosis. No edema Musculoskeletal: No gross deformities.  Skin: Warm. Dry. No rashes. No icterus.  Neurologic: No focal deficits. Moving all extremities. Psychiatric: Cooperative. Appropriate mood. Appropriate affect.   ASSESSMENT/PLAN:   Assessment & Plan Essential hypertension, benign Elevated today, states her home Bps are normal. Will continue home meds. If home BP >140/80, she will call our office. Consider increasing at FU.  Persistent atrial fibrillation (HCC) Stable, FU cards Type 2 diabetes mellitus with diabetic neuropathy, without long-term current use of insulin (HCC) - Continue trulicity . Will repeat A1c at her physical next month. Discussed numbness and tingling, likely diabetic neuropathy. Will trial gabapentin  100mg  at night. If no improvement, consider B12 and CBC  to rule out other causes.  - Plans to get eye exam soon - Not currently on statin, will address at 1 month FU     Milda LITTIE Deed, MD [facility]

## 2024-02-16 ENCOUNTER — Ambulatory Visit: Admitting: Pulmonary Disease

## 2024-02-16 ENCOUNTER — Other Ambulatory Visit: Payer: Self-pay

## 2024-02-16 DIAGNOSIS — Z1231 Encounter for screening mammogram for malignant neoplasm of breast: Secondary | ICD-10-CM

## 2024-02-26 MED ORDER — FREESTYLE LIBRE 3 SENSOR MISC: 1.0000 | Freq: Every day | 3 refills | Status: DC

## 2024-03-01 ENCOUNTER — Ambulatory Visit (INDEPENDENT_AMBULATORY_CARE_PROVIDER_SITE_OTHER)

## 2024-03-01 VITALS — BP 140/84 | HR 47 | Ht 68.0 in | Wt 198.6 lb

## 2024-03-01 DIAGNOSIS — I10 Essential (primary) hypertension: Secondary | ICD-10-CM

## 2024-03-01 DIAGNOSIS — E119 Type 2 diabetes mellitus without complications: Secondary | ICD-10-CM | POA: Diagnosis not present

## 2024-03-01 MED ORDER — HYDROCHLOROTHIAZIDE 25 MG PO TABS: 25.0000 mg | ORAL_TABLET | Freq: Every day | ORAL | 3 refills | Status: AC

## 2024-03-01 NOTE — Progress Notes (Signed)
 Asked by Dr. Orie to assist with [facility] 3+ CGM sensor placement and phone connection.  Patient educated on purpose, and proper use of Libre 3 APP with sensor.  Following instruction patient verbalized understanding of treatment plan and use of CGM.  Patient was able to demonstrate appropriate technique while placing.  Lawson Mao, PharmD Candidate - PY3 student

## 2024-03-01 NOTE — Progress Notes (Signed)
    SUBJECTIVE:   CHIEF COMPLAINT / HPI:   Increased BP - Had episode at work with increased BP, felt heart racing 3 days ago.  - She drank water, took a break, ate some crackers and felt better after appx 1 hour. She did not need to leave work. BP improved as well at work - Has not had any more episodes of palpitations. BP has been ok. She does feel improved today.  - increased stress at work. Lots of turn over at work  BP Readings from Last 4 Encounters:  03/01/24 (!) 150/74  02/09/24 (!) 148/72  01/10/24 136/80  12/13/23 (!) 158/72    PERTINENT  PMH / PSH: HTN, DM2  OBJECTIVE:   BP (!) 150/74   Pulse (!) 47   Ht 5' 8 (1.727 m)   Wt 198 lb 9.6 oz (90.1 kg)   LMP 10/01/2010   SpO2 100%   BMI 30.20 kg/m   Physical Exam General: Alert, conversant, cooperative. No acute distress.  HEENT: PERRL. EOMI. MMM.  Cardiovascular: RRR Respiratory: Lungs CTAB. Normal work of breathing. Abdomen: Non distended Extremities: No cyanosis. No edema Musculoskeletal: No gross deformities.  Skin: Warm. Dry. No rashes. No icterus.  Neurologic: No focal deficits. Moving all extremities. Psychiatric: Cooperative. Appropriate mood. Appropriate affect.   ASSESSMENT/PLAN:   Assessment & Plan Essential hypertension, benign Elevated but improved on repeat. 140/84. Unclear what caused the episode, likely stress induced. Will continue to monitor Type 2 diabetes mellitus without complication, without long-term current use of insulin (HCC) Received a 2 week freestyle libre, placed today. Will FU in 2 weeks with me.      Milda LITTIE Deed, MD [facility]

## 2024-03-01 NOTE — Assessment & Plan Note (Signed)
 Elevated but improved on repeat. 140/84. Unclear what caused the episode, likely stress induced. Will continue to monitor

## 2024-03-01 NOTE — Patient Instructions (Addendum)
 It was good to see you today.   Please bring ALL of your medications with you to every visit.    Today we talked about: Heart rate and elevated BP.  Placed freestyle libre today    Thank you for choosing [facility]. Please refer to your mychart for specifics regarding today's visit or future appointments.

## 2024-03-01 NOTE — Assessment & Plan Note (Addendum)
 Received a 2 week freestyle libre, placed today. Will FU in 2 weeks with me.

## 2024-03-04 ENCOUNTER — Ambulatory Visit (INDEPENDENT_AMBULATORY_CARE_PROVIDER_SITE_OTHER): Admitting: Podiatry

## 2024-03-04 VITALS — Ht 68.0 in | Wt 198.6 lb

## 2024-03-04 DIAGNOSIS — L84 Corns and callosities: Secondary | ICD-10-CM | POA: Diagnosis not present

## 2024-03-04 DIAGNOSIS — M7741 Metatarsalgia, right foot: Secondary | ICD-10-CM

## 2024-03-04 DIAGNOSIS — M7742 Metatarsalgia, left foot: Secondary | ICD-10-CM | POA: Diagnosis not present

## 2024-03-04 DIAGNOSIS — E1140 Type 2 diabetes mellitus with diabetic neuropathy, unspecified: Secondary | ICD-10-CM | POA: Diagnosis not present

## 2024-03-04 NOTE — Progress Notes (Signed)
 Subjective:  Patient ID: [name], female    DOB: 06/04/1965,  MRN: [id number]  Chief Complaint  Patient presents with   Foot Problem    RM 20  Diabetic/Both feet experiencing sharp pain,tingling, and burning sensation. (A1C 6.6). Patient symptoms have been present for more than a year.     Discussed the use of AI scribe software for clinical note transcription with the patient, who gave verbal consent to proceed.  History of Present Illness [name] is a 58 year old female with diabetes who presents with burning and tingling in her feet.  She experiences burning and tingling sensations in her feet, particularly on the top and toes. These symptoms worsen after consuming sweet foods. About a month ago, she experienced numbness and a sensation as if the ground was vibrating beneath her feet.  She has large calluses on her feet that recur quickly despite regular care. Her insoles are worn out, and she feels like she is walking on the side of her foot. She has a history of ankle fusion and a screw in her heel, which she manages by keeping the area filed down.  She is currently taking gabapentin  100 mg three times a day for her symptoms, which causes sleepiness.  She also has a history of sciatica and pinched nerves, which may contribute to her symptoms. She underwent heart surgery in August 2025 for atrial fibrillation, which included an ablation procedure. She is currently on blood thinners and has a low heart rate, previously diagnosed as athlete's heart.      Objective:    Physical Exam VASCULAR: DP and PT pulse palpable. Foot is warm and well-perfused. Capillary fill time is brisk. Excellent circulation. DERMATOLOGIC: Normal skin turgor, texture, and temperature. No new lesions, open lesions, rashes, or ulcerations. NEUROLOGIC: Decreased peripheral sensation in plantar forefoot and toe tips. ORTHOPEDIC: Fixed ankle equinus with fusion. Pain with pressure  on callused areas. EXTREMITIES: Large calluses on left foot submetatarsal one, lateral five, medial right hallux, distal tip of third.  Pain with palpation   No images are attached to the encounter.    Results Procedure: Callus debridement Description: Calluses sharply debrided on the bilateral foot, including left submetatarsal 1 and fifth, medial right hallux, and distal tip of the right third toe. Pain with pressure on these areas was noted.   Assessment:   1. Metatarsalgia of both feet   2. Type 2 diabetes mellitus with diabetic neuropathy, without long-term current use of insulin (HCC)   3. Callus of foot      Plan:  Patient was evaluated and treated and all questions answered.  Assessment and Plan Assessment & Plan Type 2 diabetes mellitus with diabetic polyneuropathy Diabetic polyneuropathy with burning, tingling, and numbness in the feet, exacerbated by high blood sugar levels. Symptoms are persistent and worsen with sweet intake. Gabapentin  100 mg three times daily is being used, with sleepiness as a side effect. Neuropathy may be compounded by pre-existing sciatica and back issues. Emphasis on blood sugar control to prevent worsening of neuropathy. Gabapentin  titration discussed to optimize symptom control while minimizing side effects. - Continue gabapentin  100 mg three times daily for 1-2 months, then increase to 100 mg, 100 mg, 300 mg at night for another month, and finally 300 mg in the morning and 100 mg at night. - Check feet daily for cuts, scrapes, or non-healing wounds. - Avoid going barefoot and use reliable temperature checks for foot soaks.  Calluses of feet Large calluses  present on the left foot subconjunctival one, lateral five, medial right hallux, and distal tip of third. Pain with pressure on these areas. Calluses recur quickly despite regular trimming. - Scheduled follow-up for callus debridement as needed. - Scheduled follow-up with orthotist for new  orthotics and evaluation of current ones.  Status post right ankle fusion with fixed equinus deformity Fixed ankle equinus deformity post right ankle fusion. No new lesions. Good circulation with palpable pulses and good capillary refill time. No current issues with ulcers or gangrene. - Scheduled follow-up with orthotist for orthotics fitting and evaluation of current ones.      Return if symptoms worsen or fail to improve.

## 2024-03-04 NOTE — Patient Instructions (Addendum)
  VISIT SUMMARY: During your visit, we discussed the burning and tingling sensations in your feet, which are related to your diabetes and neuropathy. We also addressed the recurring calluses on your feet and your history of ankle fusion.  YOUR PLAN: -TYPE 2 DIABETES MELLITUS WITH DIABETIC POLYNEUROPATHY: Diabetic polyneuropathy is a type of nerve damage that can occur with diabetes, leading to symptoms like burning, tingling, and numbness in the feet. It is important to control your blood sugar levels to prevent worsening of these symptoms. You should continue taking gabapentin  100 mg three times daily for 1-2 months, then increase to 100 mg in the morning, 100 mg in the afternoon, and 300 mg at night for another month, and finally 300 mg in the morning and 100 mg at night. Check your feet daily for any cuts, scrapes, or non-healing wounds, avoid going barefoot, and use reliable temperature checks for foot soaks. We have scheduled a follow-up with an orthotist for new orthotics and evaluation of your current ones.  -CALLUSES OF FEET: Calluses are thickened areas of skin that develop due to repeated pressure or friction. You have large calluses on your feet that recur quickly despite regular trimming. We have scheduled a follow-up for callus trimming as needed.  -STATUS POST RIGHT ANKLE FUSION WITH FIXED EQUINUS DEFORMITY: A fixed equinus deformity is a condition where the ankle is permanently pointed downward, often due to surgery like ankle fusion. You have good circulation and no current issues with ulcers or gangrene. We have scheduled a follow-up with an orthotist for orthotics fitting and evaluation of your current ones.  INSTRUCTIONS: Please follow up with the orthotist for new orthotics and evaluation of your current ones. Continue to monitor your feet daily and attend the scheduled follow-up for callus trimming as needed.                      Contains text generated by  Abridge.                                 Contains text generated by Abridge.

## 2024-03-08 NOTE — Progress Notes (Signed)
 Cardiology Office Note:  .   Date:  03/08/2024  ID:  [name], DOB [birth year], MRN [id number] PCP: Orie Milda CROME, MD  [facility] Providers Cardiologist:  Shelda Bruckner, MD Electrophysiologist:  OLE ONEIDA HOLTS, MD {  History of Present Illness: .   [name] is a 58 y.o. female w/PMHx of  HTN, DM, HLD AFib  Referred to EP for management strategies for her Afib, consideration for ablation. Planned to proceed with ablation. Did also discuss progressively worsening SND, some suspicion of amyloid, planned for PET scan Not felt to need PPM at this juncture.  PET was negative  AFib ablation 12/13/23  She saw AFib clinic as usual 01/10/24, no procedural concerns, feeling well, no Afib  Today's visit is scheduled as her 90 day post ablation visit ROS:   She is doing well Has far less palpitations, nothing her watch has alerted her for (though did not before when she was known to be in AFib). They are generally brief Had some last night though also was getting alerts for low blood sugars and thinks that is why.  She has some b/l groin tenderness with palpation still, no pain or discomfort otherwise. She thinks she might be pushing to hard when she presses.  No bleeding or signs of bleeding No SOB, CP  Pre-ablation CT with pulm nodule > low risk, has never been a smoker   Arrhythmia/AAD hx AFib found April 2025 Nodal blockers limited by bradycardia  Studies Reviewed: SABRA    EKG done today and reviewed by myself:  SB 53bpm  12/13/23: EPS/ablation CONCLUSIONS: 1. Successful PVI 2. Successful ablation/isolation of the posterior wall 3. Intracardiac echo reveals trivial pericardial effusion, normal LA architecture 4. No early apparent complications. 5. Colchicine  0.6mg  PO BID x 5 days 6. Protonix  40mg  PO daily x 45 days  12/08/23: amyloid/PET   Findings are not suggestive of cardiac ATTR amyloidosis. The myocardium was negative  for radiotracer uptake.   The visual grade of myocardial uptake relative to the ribs was Grade 0 (No myocardial uptake and normal bone uptake).   CT images were obtained for attenuation correction and were examined for the presence of coronary calcium  when appropriate.   Prior study not available for comparison.   11/27/23: cardiac CT IMPRESSION: 1. There is normal pulmonary vein drainage into the left atrium. Measurements as reported   2.  There is no thrombus in the left atrial appendage.   3. The esophagus runs in the left atrial midline and is not in the proximity to any of the pulmonary veins.   4.  Dilated main pulmonary artery measuring 30mm   5.  Coronary calcium  score 0  Sep 18, 2023 ZIO monitor Heart rate 33-1 42, average 56 No atrial fibrillation Occasional supraventricular ectopy, 1.3% Rare ventricular ectopy   August 03, 2023 echo EF 60% RV Nishan not well-visualized No significant valvular abnormalities     Risk Assessment/Calculations:    Physical Exam:   VS:  LMP 10/01/2010    Wt Readings from Last 3 Encounters:  03/04/24 198 lb 9.6 oz (90.1 kg)  03/01/24 198 lb 9.6 oz (90.1 kg)  02/09/24 198 lb 3.2 oz (89.9 kg)    GEN: Well nourished, well developed in no acute distress NECK: No JVD; No carotid bruits CARDIAC: RRR, bradycardic, no murmurs, rubs, gallops RESPIRATORY:  CTA b/l without rales, wheezing or rhonchi  ABDOMEN: Soft, non-tender, non-distended EXTREMITIES:  No edema; No deformity   B/l groin sites  are stable, no tenderness with light palpation, some discomfort with increased pressure applied, no skin changes, excellent pulses  ASSESSMENT AND PLAN: .    persistent AFib CHA2DS2Vasc is 3, on Xarelto , appropriately dosed Minimal palpitations, none sustained, low burden post ablation Groins are stable  She inquired about xarelto , given her risk score, recommend continue.  Should she be free of Afib symptoms over time post ablation, might  consider it with reliable rhythm monitoring (updated wearable tech or ILR)  HTN Looks ok  Secondary hypercoagulable state 2/2 AFib  Discussed Dr. Hiram upcoming departure/re-location. She will c/w Dr. Lonni team > refer back to EP PRN  Dispo: Dr. Lucinda as planned (recall in Jan), sooner if needed  Signed, Charlies Macario Arthur, PA-C

## 2024-03-13 ENCOUNTER — Encounter: Payer: Self-pay | Admitting: Physician Assistant

## 2024-03-13 ENCOUNTER — Ambulatory Visit: Attending: Student in an Organized Health Care Education/Training Program | Admitting: Physician Assistant

## 2024-03-13 VITALS — BP 123/73 | HR 53 | Ht 68.0 in | Wt 203.0 lb

## 2024-03-13 DIAGNOSIS — D6869 Other thrombophilia: Secondary | ICD-10-CM | POA: Diagnosis not present

## 2024-03-13 DIAGNOSIS — I4819 Other persistent atrial fibrillation: Secondary | ICD-10-CM

## 2024-03-13 DIAGNOSIS — I10 Essential (primary) hypertension: Secondary | ICD-10-CM

## 2024-03-13 NOTE — Patient Instructions (Signed)
 Medication Instructions:   Your physician recommends that you continue on your current medications as directed. Please refer to the Current Medication list given to you today.    *If you need a refill on your cardiac medications before your next appointment, please call your pharmacy*   Lab Work: NONE ORDERED  TODAY   If you have labs (blood work) drawn today and your tests are completely normal, you will receive your results only by: MyChart Message (if you have MyChart) OR A paper copy in the mail If you have any lab test that is abnormal or we need to change your treatment, we will call you to review the results.   Testing/Procedures: NONE ORDERED  TODAY   Follow-Up: At [facility], you and your health needs are our priority.  As part of our continuing mission to provide you with exceptional heart care, our providers are all part of one team.  This team includes your primary Cardiologist (physician) and Advanced Practice Providers or APPs (Physician Assistants and Nurse Practitioners) who all work together to provide you with the care you need, when you need it.  Your next appointment:  IN  RECALL   [place]  JANUARY  WITH  [facility]ALK[facility] / [place]      EP DEPARTMENT CONTACT [facility] [phone] AS NEEDED FOR  ANY CARDIAC RELATED SYMPTOMS   We recommend signing up for the patient portal called MyChart.  Sign up information is provided on this After Visit Summary.  MyChart is used to connect with patients for Virtual Visits (Telemedicine).  Patients are able to view lab/test results, encounter notes, upcoming appointments, etc.  Non-urgent messages can be sent to your provider as well.   To learn more about what you can do with MyChart, go to [email or web address].   Other Instructions

## 2024-03-14 ENCOUNTER — Other Ambulatory Visit: Payer: Self-pay | Admitting: Medical Genetics

## 2024-03-15 ENCOUNTER — Ambulatory Visit (INDEPENDENT_AMBULATORY_CARE_PROVIDER_SITE_OTHER)

## 2024-03-15 ENCOUNTER — Ambulatory Visit
Admission: RE | Admit: 2024-03-15 | Discharge: 2024-03-15 | Disposition: A | Discharge status: Home / Self Care | Source: Ambulatory Visit

## 2024-03-15 VITALS — BP 133/81 | HR 51 | Ht 68.0 in | Wt 202.0 lb

## 2024-03-15 DIAGNOSIS — I4811 Longstanding persistent atrial fibrillation: Secondary | ICD-10-CM

## 2024-03-15 DIAGNOSIS — Z1231 Encounter for screening mammogram for malignant neoplasm of breast: Secondary | ICD-10-CM | POA: Diagnosis not present

## 2024-03-15 DIAGNOSIS — I10 Essential (primary) hypertension: Secondary | ICD-10-CM

## 2024-03-15 DIAGNOSIS — E1140 Type 2 diabetes mellitus with diabetic neuropathy, unspecified: Secondary | ICD-10-CM

## 2024-03-15 LAB — POCT GLYCOSYLATED HEMOGLOBIN (HGB A1C): Hemoglobin A1C: 6 % — AB (ref 4.0–5.6)

## 2024-03-15 NOTE — Assessment & Plan Note (Signed)
 BP at goal, BMP in July with GFR in 60s

## 2024-03-15 NOTE — Assessment & Plan Note (Signed)
 CGM data reviewed today, had a few episodes of 60s in middle of night. Encouraged to eat small snack before bed. Repeat A1c and lipid done today.

## 2024-03-15 NOTE — Assessment & Plan Note (Signed)
 Stable, FU cardiology.

## 2024-03-15 NOTE — Patient Instructions (Signed)
 It was good to see you today.   Please bring ALL of your medications with you to every visit.    Today we talked about: Diabetes- reviewed your CGM today Mammogram scheduled today   Thank you for choosing [facility]. Please refer to your mychart for specifics regarding today's visit or future appointments.

## 2024-03-15 NOTE — Progress Notes (Signed)
    SUBJECTIVE:   CHIEF COMPLAINT / HPI:   HTN BP Readings from Last 4 Encounters:  03/15/24 133/81  03/13/24 123/73  03/01/24 (!) 140/84  02/09/24 (!) 148/72  - hydrochlorothiazide  25mg , aldactone  50mg   DM2 Lab Results  Component Value Date   HGBA1C 6.2 (H) 08/04/2023   HGBA1C 6.1 03/28/2023   HGBA1C 6.6 (H) 03/10/2023  - on trulicity  - eats dinner at 7pm - had some FSBS in 60s on CGM.   Afib - is anticoagulated with xarelto , no rate control as she had ablation. Follows with cards   PERTINENT  PMH / PSH: DM2, HLD, HTN, Afib  OBJECTIVE:   BP 133/81   Pulse (!) 51   Ht 5' 8 (1.727 m)   Wt 202 lb (91.6 kg)   LMP 10/01/2010   SpO2 100%   BMI 30.71 kg/m   Physical Exam General: Alert, conversant, cooperative. No acute distress.  HEENT: PERRL. EOMI. MMM.  Cardiovascular: RRR, no afib today Respiratory: Lungs CTAB. Normal work of breathing. Abdomen: Non distended Extremities: No cyanosis. No edema Musculoskeletal: No gross deformities.  Skin: Warm. Dry. No rashes. No icterus.  Neurologic: No focal deficits. Moving all extremities. Psychiatric: Cooperative. Appropriate mood. Appropriate affect.   ASSESSMENT/PLAN:   Assessment & Plan Type 2 diabetes mellitus with diabetic neuropathy, without long-term current use of insulin (HCC) CGM data reviewed today, had a few episodes of 60s in middle of night. Encouraged to eat small snack before bed. Repeat A1c and lipid done today.  Essential hypertension, benign BP at goal, BMP in July with GFR in 60s Longstanding persistent atrial fibrillation (HCC) Stable, FU cardiology.      Milda LITTIE Deed, MD [facility]

## 2024-03-16 LAB — LIPID PANEL
Chol/HDL Ratio: 3.8 ratio (ref 0.0–4.4)
Cholesterol, Total: 162 mg/dL (ref 100–199)
HDL: 43 mg/dL (ref >39)
LDL Chol Calc (NIH): 102 mg/dL — ABNORMAL HIGH (ref 0–99)
Triglycerides: 88 mg/dL (ref 0–149)
VLDL Cholesterol Cal: 17 mg/dL (ref 5–40)

## 2024-03-18 ENCOUNTER — Ambulatory Visit: Payer: Self-pay

## 2024-03-22 ENCOUNTER — Ambulatory Visit

## 2024-03-22 DIAGNOSIS — M7742 Metatarsalgia, left foot: Secondary | ICD-10-CM | POA: Diagnosis not present

## 2024-03-22 DIAGNOSIS — M7741 Metatarsalgia, right foot: Secondary | ICD-10-CM | POA: Diagnosis not present

## 2024-03-22 DIAGNOSIS — M2142 Flat foot [pes planus] (acquired), left foot: Secondary | ICD-10-CM

## 2024-03-22 DIAGNOSIS — M2141 Flat foot [pes planus] (acquired), right foot: Secondary | ICD-10-CM

## 2024-03-22 NOTE — Progress Notes (Signed)
 Orthotics   Patient was present and evaluated for Custom molded foot orthotics. Patient will benefit from CFO's to provide total contact to BIL MLA's helping to balance and distribute body weight more evenly across BIL feet helping to reduce plantar pressure and pain. Orthotic will also encourage FF / RF alignment  Patient was scanned today and will return for fitting upon receipt  Patient states Ded is met   [name] CPed

## 2024-04-10 DIAGNOSIS — M65331 Trigger finger, right middle finger: Secondary | ICD-10-CM | POA: Diagnosis not present

## 2024-04-10 DIAGNOSIS — M654 Radial styloid tenosynovitis [de Quervain]: Secondary | ICD-10-CM | POA: Diagnosis not present

## 2024-04-10 DIAGNOSIS — M25532 Pain in left wrist: Secondary | ICD-10-CM | POA: Diagnosis not present

## 2024-04-10 DIAGNOSIS — M1812 Unilateral primary osteoarthritis of first carpometacarpal joint, left hand: Secondary | ICD-10-CM | POA: Diagnosis not present

## 2024-04-18 ENCOUNTER — Telehealth: Payer: Self-pay

## 2024-04-18 ENCOUNTER — Other Ambulatory Visit

## 2024-04-18 NOTE — Telephone Encounter (Signed)
 Patient's Orthotics are in and balance is $542.00

## 2024-05-01 NOTE — Telephone Encounter (Signed)
 05/01/24 spoke to pt letting pt know orthotics are in.  She does have a balance and is going to call ins.

## 2024-05-24 ENCOUNTER — Ambulatory Visit (HOSPITAL_BASED_OUTPATIENT_CLINIC_OR_DEPARTMENT_OTHER): Admitting: Family

## 2024-05-24 ENCOUNTER — Encounter (HOSPITAL_BASED_OUTPATIENT_CLINIC_OR_DEPARTMENT_OTHER): Payer: Self-pay | Admitting: Family

## 2024-05-24 VITALS — BP 128/76 | HR 52 | Ht 68.0 in | Wt 204.4 lb

## 2024-05-24 DIAGNOSIS — E782 Mixed hyperlipidemia: Secondary | ICD-10-CM | POA: Diagnosis not present

## 2024-05-24 DIAGNOSIS — I10 Essential (primary) hypertension: Secondary | ICD-10-CM | POA: Diagnosis not present

## 2024-05-24 DIAGNOSIS — I480 Paroxysmal atrial fibrillation: Secondary | ICD-10-CM

## 2024-05-24 DIAGNOSIS — D6859 Other primary thrombophilia: Secondary | ICD-10-CM | POA: Diagnosis not present

## 2024-05-24 NOTE — Patient Instructions (Addendum)
 Medication Instructions:  Continue your current medications  *If you need a refill on your cardiac medications before your next appointment, please call your pharmacy*  Follow-Up: At [facility], you and your health needs are our priority.  As part of our continuing mission to provide you with exceptional heart care, our providers are all part of one team.  This team includes your primary Cardiologist (physician) and Advanced Practice Providers or APPs (Physician Assistants and Nurse Practitioners) who all work together to provide you with the care you need, when you need it.  Your next appointment:   6-9 month(s)  Provider:   Shelda Bruckner, MD, Rosaline Bane, NP, or Reche Finder, NP    We recommend signing up for the patient portal called MyChart.  Sign up information is provided on this After Visit Summary.  MyChart is used to connect with patients for Virtual Visits (Telemedicine).  Patients are able to view lab/test results, encounter notes, upcoming appointments, etc.  Non-urgent messages can be sent to your provider as well.   To learn more about what you can do with MyChart, go to [email or web address].   Other Instructions We will reach out to EP (electrophysiology) team about when we could consider loop recorder for monitoring of any recurrent atrial fibrillation.  We will let you know what they say via MyChart message.

## 2024-05-24 NOTE — Progress Notes (Signed)
 " Cardiology Office Note:  .   Date:  05/24/2024  ID:  [name], DOB 1966-03-20, MRN [id number] PCP: Orie Milda CROME, MD  [facility] Providers Cardiologist:  Shelda Bruckner, MD Electrophysiologist:  OLE ONEIDA HOLTS, MD (Inactive)    History of Present Illness: .   [name] is a 59 y.o. female with hx of HTN, DM2, atrial fibrillation.  Family history notable for youngest brother with a murmur, mother with MI, brother with heart failure.  Admitted 4/3 - 08/04/2023 with new onset A-fib RVR.  Metoprolol  decreased to 25 mg daily due to bradycardia.  Xarelto  20 mg daily initiated.  Echo LVEF 60 to 65%, mild LVH.  Indapamide  spironolactone  held due to normotension.  Seen 08/15/23. Due to bradycardia, metoprolol  succinate reduced to 12.5mg  daily. Referred to EP to discuss ablation. At visit 10/27/23 with pahrmacist hydrochlorothiazide  25mg  daily added to antihypertensive regimen.   Underwent atrial fibrillation ablation 12/13/2023.  Follow-up 03/13/2024 given CHA2DS2-VASc of 3 status recommend to continue Xarelto .  It was noted that should she be free of atrial fibrillation symptoms over time post ablation could consider discontinue ICD with reliable rhythm monitoring such as updated wearable tech or ILR.  Plan was to follow with EP as needed.  Presents today for follow up. She reports she is doing well overall, has noticed some on and off palpitations. She wears an apple watch that has shown 4%-7% atrial fibrillation, however previously was not alerting afib when she was in known afib. Currently sinus brady. She would like to be able to come off of her Xarelto  if she able and is very interested in moving forward with a loop recorder.   Her blood pressure in the office today is 148/76, she attributes this to driving on an unfamiliar highway to get to clinic. She takes her blood pressure twice daily at home and notes that it is usually 120s/70s. Recheck in office is  128/76..   She denies chest pain but does experience some left sided rib/epigastric pain that is worse after eating. She has started taking prilosec that helps with these symptoms.   She does not have a formal exercise regimen, her job requires her to be on her feet at all times. [facility] but was told to wait until after her ablation. She has worked on improving diet to control diabetes. Last LDL was 102, previously intolerant to statin. Would like to work on diet and exercise to improve to reach LDL goal <100.   Reports no shortness of breath nor dyspnea on exertion. Reports no chest pain, pressure, or tightness. No edema, orthopnea, PND.  Prior antihypertensives ACE/ARB - angioedema Amlodipine  - peripheral edema  ROS: Please see the history of present illness.    All other systems reviewed and are negative.   Studies Reviewed: .        Cardiac Studies & Procedures   ______________________________________________________________________________________________   STRESS TESTS  MYOCARDIAL PERFUSION IMAGING 06/16/2015  Interpretation Summary  Nuclear stress EF: 56%.  This is a low risk study.  The left ventricular ejection fraction is normal (55-65%).  There is a small defect of mild severity present in the apical septal and apex location. The defect is non-reversible. This is consistent with breast attenuation artifact with no ischemia noted.  There is a small focal area of radiotracer that appears limited to the stress images noted on the right breast. Recommend followup mammogram if patient  has not had one recently.   ECHOCARDIOGRAM  ECHOCARDIOGRAM COMPLETE 08/03/2023  Narrative ECHOCARDIOGRAM REPORT    Patient Name:   YAMILI Holloway Date of Exam: 08/03/2023 Medical Rec #:  [id number]              Height:       68.0 in Accession #:    [id number]             Weight:       206.0 lb Date of Birth:   09-06-1965              BSA:          2.070 m Patient Age:    58 years               BP:           106/69 mmHg Patient Gender: F                      HR:           141 bpm. Exam Location:  Inpatient  Procedure: 2D Echo, Cardiac Doppler and Color Doppler (Both Spectral and Color Flow Doppler were utilized during procedure).  Indications:    Atrial Fibrillation I48.91  History:        Patient has no prior history of Echocardiogram examinations. Risk Factors:Hypertension.  Sonographer:    Jayson Gaskins Referring Phys: [phone] LAYMON PARAS MCINTYRE  IMPRESSIONS   1. Left ventricular ejection fraction, by estimation, is 60 to 65%. The left ventricle has normal function. The left ventricle has no regional wall motion abnormalities. There is mild left ventricular hypertrophy. Left ventricular diastolic function could not be evaluated. 2. Right ventricular systolic function was not well visualized. The right ventricular size is not well visualized. 3. The mitral valve is grossly normal. No evidence of mitral valve regurgitation. 4. The aortic valve is tricuspid. Aortic valve regurgitation is not visualized. 5. Rhythm strip during this exam demonstrates atrial fibrillation and with RVR.  Comparison(s): No prior Echocardiogram.  FINDINGS Left Ventricle: Left ventricular ejection fraction, by estimation, is 60 to 65%. The left ventricle has normal function. The left ventricle has no regional wall motion abnormalities. The left ventricular internal cavity size was normal in size. There is mild left ventricular hypertrophy. Left ventricular diastolic function could not be evaluated due to atrial fibrillation. Left ventricular diastolic function could not be evaluated.  Right Ventricle: The right ventricular size is not well visualized. Right vetricular wall thickness was not well visualized. Right ventricular systolic function was not well visualized.  Left Atrium: Left atrial size was normal in  size.  Right Atrium: Right atrial size was normal in size.  Pericardium: There is no evidence of pericardial effusion.  Mitral Valve: The mitral valve is grossly normal. No evidence of mitral valve regurgitation.  Tricuspid Valve: The tricuspid valve is not well visualized. Tricuspid valve regurgitation is not demonstrated.  Aortic Valve: The aortic valve is tricuspid. Aortic valve regurgitation is not visualized. Aortic valve peak gradient measures 6.4 mmHg.  Pulmonic Valve: The pulmonic valve was grossly normal. Pulmonic valve regurgitation is trivial.  Aorta: The aortic root and ascending aorta are structurally normal, with no evidence of dilitation.  IAS/Shunts: No atrial level shunt detected by color flow Doppler.  EKG: Rhythm strip during this exam demonstrates atrial fibrillation and with RVR.   LEFT VENTRICLE PLAX 2D LVIDd:         3.90 cm LVIDs:  2.70 cm LV PW:         1.10 cm LV IVS:        1.10 cm LVOT diam:     1.80 cm LVOT Area:     2.54 cm   LEFT ATRIUM             Index LA Vol (A2C):   49.3 ml 23.82 ml/m LA Vol (A4C):   61.7 ml 29.81 ml/m LA Biplane Vol: 59.4 ml 28.70 ml/m AORTIC VALVE AV Area (Vmax): 2.48 cm AV Vmax:        126.00 cm/s AV Peak Grad:   6.4 mmHg LVOT Vmax:      123.00 cm/s  AORTA Ao Root diam: 2.70 cm   SHUNTS Systemic Diam: 1.80 cm  Vinie Maxcy MD Electronically signed by Vinie Maxcy MD Signature Date/Time: 08/03/2023/4:06:44 PM    Final    MONITORS  LONG TERM MONITOR (3-14 DAYS) 09/18/2023  Narrative Patch Wear Time:  3 days and 6 hours (2025-05-09T10:10:27-0400 to 2025-05-12T16:50:19-0400)  Patient had a min HR of 33 bpm, max HR of 142 bpm, and avg HR of 56 bpm. Predominant underlying rhythm was Sinus Rhythm. No VT, SVT, atrial fibrillation, high degree block, or pauses noted. Isolated atrial ectopy was occasional (1.3%) and ventricular ectopy was rare (<1%). There were >120 patient triggered events. These  were sinus bradycardia/sinus rhythm, rarely with PAC or PVC.      PYP SCAN  MYOCARDIAL AMYLOID PLANAR AND SPECT 12/08/2023  Interpretation Summary   Findings are not suggestive of cardiac ATTR amyloidosis. The myocardium was negative for radiotracer uptake.   The visual grade of myocardial uptake relative to the ribs was Grade 0 (No myocardial uptake and normal bone uptake).   CT images were obtained for attenuation correction and were examined for the presence of coronary calcium  when appropriate.   Prior study not available for comparison.  ______________________________________________________________________________________________      Risk Assessment/Calculations:    CHA2DS2-VASc Score = 3   This indicates a 3.2% annual risk of stroke. The patient's score is based upon: CHF History: 0 HTN History: 1 Diabetes History: 1 Stroke History: 0 Vascular Disease History: 0 Age Score: 0 Gender Score: 1            Physical Exam:   VS:  BP 128/76   Pulse (!) 52   Ht 5' 8 (1.727 m)   Wt 204 lb 6.4 oz (92.7 kg)   LMP 10/01/2010   SpO2 94%   BMI 31.08 kg/m    Wt Readings from Last 3 Encounters:  05/24/24 204 lb 6.4 oz (92.7 kg)  03/15/24 202 lb (91.6 kg)  03/13/24 203 lb (92.1 kg)    Vitals:   05/24/24 0956 05/24/24 1025  BP: (!) 148/76 128/76  Pulse: (!) 52   Height: 5' 8 (1.727 m)   Weight: 204 lb 6.4 oz (92.7 kg)   SpO2: 94%   BMI (Calculated): 31.09      GEN: Well nourished, well developed in no acute distress NECK: No JVD; No carotid bruits CARDIAC: RRR, no murmurs, rubs, gallops RESPIRATORY:  Clear to auscultation without rales, wheezing or rhonchi  ABDOMEN: Soft, non-tender, non-distended EXTREMITIES:  No edema; No deformity   ASSESSMENT AND PLAN: .    A-fib/hypercoagulable state- s/p ablation 12/2023. Now follows with EP PRN. Reports some occasional palpitations with her apple watch showing 4-7% afib, however known to be unreliable previously. Currently  sinus brady. CHA2DS2-VASc Score = 3 [CHF History: 0, HTN History:  1, Diabetes History: 1, Stroke History: 0, Vascular Disease History: 0, Age Score: 0, Gender Score: 1].  Therefore, the patient's annual risk of stroke is 3.2 %.    Continue Xarelto  20 mg daily. Denies bleeding complications.  She is interesting in moving forward with loop recorder and consideration of stopping OAC. Will discuss with Charlies Arthur, PA recommended timing of this.   HTN- Initial BP in office 148/76, attributed to stressful drive into clinic on an unfamiliar highway. Recheck is 128/76. Continue Hydrochlorothiazide  25 mg daily and spironolactone  50 mg daily. Discussed to monitor BP at home at least 2 hours after medications and sitting for 5-10 minutes.   DM2-Per primary care team. Last A1c 6.2 08/04/23  HLD - 03/2024 total cholesterol 162, HDL 43, LDL 102, up from last year, 03/2023 total cholesterol 121, HDL 40, LDL 59. Not presently on lipid lowering therapy, previously did not tolerate statin (Rosuvastatin ).The 10-year ASCVD risk score (Arnett DK, et al., 2019) is: 14.8%. She plans to make lifestyle changes. Recommend aiming for 150 minutes of moderate intensity activity per week and following a heart healthy diet. [facility] Ideally LDL goal <100, if lipids do not improve could trial Pravastatin vs Zetia.       Dispo: follow up in 6-9 months or sooner if necessary.   Signed, Reche GORMAN Finder, NP   "

## 2024-05-27 ENCOUNTER — Telehealth: Payer: Self-pay

## 2024-05-27 NOTE — Telephone Encounter (Signed)
 Spoke with Kari about the PREP program. Is interested, but would need a class in the evening so cannot participate at this time.

## 2024-08-07 ENCOUNTER — Ambulatory Visit: Admitting: Cardiovascular Disease
